# Patient Record
Sex: Male | Born: 1935 | Race: White | Hispanic: No | Smoking: Never smoker
Health system: Southern US, Community
[De-identification: ages and names within clinical notes are randomized; demographics above are authoritative.]

## PROBLEM LIST (undated history)

## (undated) DIAGNOSIS — I499 Cardiac arrhythmia, unspecified: Secondary | ICD-10-CM

## (undated) DIAGNOSIS — I10 Essential (primary) hypertension: Secondary | ICD-10-CM

## (undated) DIAGNOSIS — I509 Heart failure, unspecified: Secondary | ICD-10-CM

## (undated) DIAGNOSIS — N189 Chronic kidney disease, unspecified: Secondary | ICD-10-CM

## (undated) DIAGNOSIS — R001 Bradycardia, unspecified: Secondary | ICD-10-CM

## (undated) DIAGNOSIS — H9190 Unspecified hearing loss, unspecified ear: Secondary | ICD-10-CM

## (undated) DIAGNOSIS — F0390 Unspecified dementia without behavioral disturbance: Secondary | ICD-10-CM

## (undated) HISTORY — DX: Bradycardia, unspecified: R00.1

## (undated) HISTORY — PX: PARATHYROIDECTOMY: SHX19

## (undated) HISTORY — PX: TONSILLECTOMY: SUR1361

## (undated) HISTORY — DX: Cardiac arrhythmia, unspecified: I49.9

## (undated) HISTORY — DX: Chronic kidney disease, unspecified: N18.9

## (undated) HISTORY — DX: Essential (primary) hypertension: I10

## (undated) HISTORY — DX: Heart failure, unspecified: I50.9

---

## 1997-06-28 ENCOUNTER — Encounter: Admission: RE | Admit: 1997-06-28 | Discharge: 1997-06-28 | Payer: Self-pay | Admitting: Internal Medicine

## 1997-09-02 ENCOUNTER — Encounter: Admission: RE | Admit: 1997-09-02 | Discharge: 1997-09-02 | Payer: Self-pay | Admitting: Internal Medicine

## 1997-10-04 ENCOUNTER — Encounter: Admission: RE | Admit: 1997-10-04 | Discharge: 1997-10-04 | Payer: Self-pay | Admitting: Internal Medicine

## 1997-11-04 ENCOUNTER — Encounter: Admission: RE | Admit: 1997-11-04 | Discharge: 1997-11-04 | Payer: Self-pay | Admitting: Internal Medicine

## 1998-02-02 ENCOUNTER — Encounter: Admission: RE | Admit: 1998-02-02 | Discharge: 1998-02-02 | Payer: Self-pay | Admitting: Internal Medicine

## 1998-02-10 ENCOUNTER — Encounter: Admission: RE | Admit: 1998-02-10 | Discharge: 1998-02-10 | Payer: Self-pay | Admitting: Internal Medicine

## 1998-03-22 ENCOUNTER — Encounter: Admission: RE | Admit: 1998-03-22 | Discharge: 1998-03-22 | Payer: Self-pay | Admitting: Internal Medicine

## 1998-04-04 ENCOUNTER — Encounter: Admission: RE | Admit: 1998-04-04 | Discharge: 1998-04-04 | Payer: Self-pay | Admitting: Internal Medicine

## 1998-06-01 ENCOUNTER — Encounter: Payer: Self-pay | Admitting: Emergency Medicine

## 1998-06-01 ENCOUNTER — Emergency Department (HOSPITAL_COMMUNITY): Admission: EM | Admit: 1998-06-01 | Discharge: 1998-06-01 | Payer: Self-pay | Admitting: Emergency Medicine

## 1998-06-02 ENCOUNTER — Encounter: Admission: RE | Admit: 1998-06-02 | Discharge: 1998-06-02 | Payer: Self-pay | Admitting: Internal Medicine

## 1998-08-29 ENCOUNTER — Ambulatory Visit (HOSPITAL_COMMUNITY): Admission: RE | Admit: 1998-08-29 | Discharge: 1998-08-29 | Payer: Self-pay | Admitting: Ophthalmology

## 1998-11-01 ENCOUNTER — Encounter: Admission: RE | Admit: 1998-11-01 | Discharge: 1998-11-01 | Payer: Self-pay | Admitting: Internal Medicine

## 1999-01-24 ENCOUNTER — Encounter: Admission: RE | Admit: 1999-01-24 | Discharge: 1999-01-24 | Payer: Self-pay | Admitting: Internal Medicine

## 1999-03-21 ENCOUNTER — Encounter: Admission: RE | Admit: 1999-03-21 | Discharge: 1999-03-21 | Payer: Self-pay | Admitting: Internal Medicine

## 1999-04-25 ENCOUNTER — Encounter: Admission: RE | Admit: 1999-04-25 | Discharge: 1999-04-25 | Payer: Self-pay | Admitting: Internal Medicine

## 1999-06-20 ENCOUNTER — Encounter: Admission: RE | Admit: 1999-06-20 | Discharge: 1999-06-20 | Payer: Self-pay | Admitting: Internal Medicine

## 1999-10-10 ENCOUNTER — Encounter: Admission: RE | Admit: 1999-10-10 | Discharge: 1999-10-10 | Payer: Self-pay | Admitting: Internal Medicine

## 2000-02-13 ENCOUNTER — Encounter: Admission: RE | Admit: 2000-02-13 | Discharge: 2000-02-13 | Payer: Self-pay | Admitting: Internal Medicine

## 2000-03-03 ENCOUNTER — Emergency Department (HOSPITAL_COMMUNITY): Admission: EM | Admit: 2000-03-03 | Discharge: 2000-03-03 | Payer: Self-pay | Admitting: Emergency Medicine

## 2000-03-03 ENCOUNTER — Encounter: Payer: Self-pay | Admitting: Emergency Medicine

## 2000-03-06 ENCOUNTER — Encounter: Admission: RE | Admit: 2000-03-06 | Discharge: 2000-03-06 | Payer: Self-pay | Admitting: Internal Medicine

## 2000-04-03 ENCOUNTER — Encounter: Admission: RE | Admit: 2000-04-03 | Discharge: 2000-04-03 | Payer: Self-pay | Admitting: Internal Medicine

## 2000-08-20 ENCOUNTER — Encounter: Admission: RE | Admit: 2000-08-20 | Discharge: 2000-08-20 | Payer: Self-pay | Admitting: Internal Medicine

## 2000-09-30 ENCOUNTER — Encounter: Admission: RE | Admit: 2000-09-30 | Discharge: 2000-09-30 | Payer: Self-pay | Admitting: Internal Medicine

## 2000-12-16 ENCOUNTER — Encounter: Admission: RE | Admit: 2000-12-16 | Discharge: 2000-12-16 | Payer: Self-pay | Admitting: Internal Medicine

## 2001-03-03 ENCOUNTER — Encounter: Admission: RE | Admit: 2001-03-03 | Discharge: 2001-03-03 | Payer: Self-pay | Admitting: Internal Medicine

## 2001-05-14 ENCOUNTER — Encounter: Admission: RE | Admit: 2001-05-14 | Discharge: 2001-05-14 | Payer: Self-pay | Admitting: Internal Medicine

## 2001-08-21 ENCOUNTER — Encounter: Admission: RE | Admit: 2001-08-21 | Discharge: 2001-08-21 | Payer: Self-pay | Admitting: Internal Medicine

## 2001-11-17 ENCOUNTER — Encounter: Admission: RE | Admit: 2001-11-17 | Discharge: 2001-11-17 | Payer: Self-pay | Admitting: Internal Medicine

## 2002-02-23 ENCOUNTER — Encounter: Admission: RE | Admit: 2002-02-23 | Discharge: 2002-02-23 | Payer: Self-pay | Admitting: Internal Medicine

## 2002-04-08 ENCOUNTER — Encounter (HOSPITAL_BASED_OUTPATIENT_CLINIC_OR_DEPARTMENT_OTHER): Admission: RE | Admit: 2002-04-08 | Discharge: 2002-07-07 | Payer: Self-pay | Admitting: Internal Medicine

## 2002-05-13 ENCOUNTER — Encounter: Admission: RE | Admit: 2002-05-13 | Discharge: 2002-05-13 | Payer: Self-pay | Admitting: Internal Medicine

## 2002-08-17 ENCOUNTER — Encounter: Admission: RE | Admit: 2002-08-17 | Discharge: 2002-08-17 | Payer: Self-pay | Admitting: Internal Medicine

## 2002-10-22 ENCOUNTER — Encounter: Admission: RE | Admit: 2002-10-22 | Discharge: 2002-10-22 | Payer: Self-pay | Admitting: Internal Medicine

## 2002-11-26 ENCOUNTER — Encounter: Admission: RE | Admit: 2002-11-26 | Discharge: 2002-11-26 | Payer: Self-pay | Admitting: Internal Medicine

## 2003-02-18 ENCOUNTER — Encounter: Admission: RE | Admit: 2003-02-18 | Discharge: 2003-02-18 | Payer: Self-pay | Admitting: Internal Medicine

## 2003-05-14 ENCOUNTER — Encounter: Admission: RE | Admit: 2003-05-14 | Discharge: 2003-05-14 | Payer: Self-pay | Admitting: Internal Medicine

## 2003-09-28 ENCOUNTER — Ambulatory Visit: Payer: Self-pay | Admitting: Internal Medicine

## 2003-10-22 ENCOUNTER — Ambulatory Visit: Payer: Self-pay | Admitting: Internal Medicine

## 2004-04-26 ENCOUNTER — Ambulatory Visit: Payer: Self-pay | Admitting: Internal Medicine

## 2004-07-31 ENCOUNTER — Ambulatory Visit: Payer: Self-pay | Admitting: Internal Medicine

## 2004-12-22 ENCOUNTER — Ambulatory Visit: Payer: Self-pay | Admitting: Internal Medicine

## 2005-06-05 ENCOUNTER — Ambulatory Visit: Payer: Self-pay | Admitting: Internal Medicine

## 2005-11-13 ENCOUNTER — Ambulatory Visit: Payer: Self-pay | Admitting: Internal Medicine

## 2005-11-13 LAB — CONVERTED CEMR LAB
BUN: 24 mg/dL — ABNORMAL HIGH (ref 6–23)
CO2: 25 meq/L (ref 19–32)
Calcium: 9.7 mg/dL (ref 8.4–10.5)
Chloride: 99 meq/L (ref 96–112)
Creatinine, Ser: 1.43 mg/dL (ref 0.40–1.50)
Creatinine, Urine: 94.6 mg/dL
Glucose, Bld: 202 mg/dL — ABNORMAL HIGH (ref 70–99)
Microalb Creat Ratio: 13 mg/g (ref 0.0–30.0)
Microalb, Ur: 1.23 mg/dL (ref 0.00–1.89)
Potassium: 4.2 meq/L (ref 3.5–5.3)
Sodium: 139 meq/L (ref 135–145)

## 2005-12-04 DIAGNOSIS — E1165 Type 2 diabetes mellitus with hyperglycemia: Secondary | ICD-10-CM

## 2005-12-04 DIAGNOSIS — I10 Essential (primary) hypertension: Secondary | ICD-10-CM | POA: Insufficient documentation

## 2005-12-04 DIAGNOSIS — E1149 Type 2 diabetes mellitus with other diabetic neurological complication: Secondary | ICD-10-CM | POA: Insufficient documentation

## 2005-12-04 DIAGNOSIS — E669 Obesity, unspecified: Secondary | ICD-10-CM | POA: Insufficient documentation

## 2005-12-04 DIAGNOSIS — E781 Pure hyperglyceridemia: Secondary | ICD-10-CM | POA: Insufficient documentation

## 2005-12-04 DIAGNOSIS — E1121 Type 2 diabetes mellitus with diabetic nephropathy: Secondary | ICD-10-CM | POA: Insufficient documentation

## 2005-12-26 ENCOUNTER — Ambulatory Visit: Payer: Self-pay | Admitting: Internal Medicine

## 2005-12-31 ENCOUNTER — Ambulatory Visit: Payer: Self-pay | Admitting: Internal Medicine

## 2006-04-22 ENCOUNTER — Ambulatory Visit: Payer: Self-pay | Admitting: Internal Medicine

## 2006-04-22 LAB — CONVERTED CEMR LAB
Blood Glucose, Fingerstick: 232
Hgb A1c MFr Bld: 7 %

## 2006-04-24 ENCOUNTER — Encounter: Payer: Self-pay | Admitting: Licensed Clinical Social Worker

## 2006-04-24 ENCOUNTER — Encounter: Payer: Self-pay | Admitting: Internal Medicine

## 2006-04-24 ENCOUNTER — Ambulatory Visit: Payer: Self-pay | Admitting: Hospitalist

## 2006-04-24 ENCOUNTER — Telehealth: Payer: Self-pay | Admitting: *Deleted

## 2006-04-24 LAB — CONVERTED CEMR LAB
Blood Glucose, Fingerstick: 157
Cholesterol: 133 mg/dL (ref 0–200)
HDL: 42 mg/dL (ref >39)
LDL Cholesterol: 76 mg/dL (ref 0–99)
Total CHOL/HDL Ratio: 3.2
Triglycerides: 74 mg/dL (ref <150)
VLDL: 15 mg/dL (ref 0–40)

## 2006-04-26 ENCOUNTER — Telehealth: Payer: Self-pay | Admitting: Licensed Clinical Social Worker

## 2006-04-26 ENCOUNTER — Encounter: Payer: Self-pay | Admitting: Licensed Clinical Social Worker

## 2006-05-02 ENCOUNTER — Telehealth (INDEPENDENT_AMBULATORY_CARE_PROVIDER_SITE_OTHER): Payer: Self-pay | Admitting: *Deleted

## 2006-05-08 ENCOUNTER — Telehealth (INDEPENDENT_AMBULATORY_CARE_PROVIDER_SITE_OTHER): Payer: Self-pay | Admitting: *Deleted

## 2006-06-26 ENCOUNTER — Telehealth (INDEPENDENT_AMBULATORY_CARE_PROVIDER_SITE_OTHER): Payer: Self-pay | Admitting: Pharmacy Technician

## 2006-06-26 ENCOUNTER — Telehealth (INDEPENDENT_AMBULATORY_CARE_PROVIDER_SITE_OTHER): Payer: Self-pay | Admitting: *Deleted

## 2006-07-17 ENCOUNTER — Encounter: Payer: Self-pay | Admitting: Internal Medicine

## 2006-07-23 ENCOUNTER — Telehealth (INDEPENDENT_AMBULATORY_CARE_PROVIDER_SITE_OTHER): Payer: Self-pay | Admitting: *Deleted

## 2006-08-07 ENCOUNTER — Ambulatory Visit: Payer: Self-pay | Admitting: Internal Medicine

## 2006-08-07 LAB — CONVERTED CEMR LAB
ALT: 13 units/L (ref 0–53)
AST: 14 units/L (ref 0–37)
Albumin: 4.6 g/dL (ref 3.5–5.2)
Alkaline Phosphatase: 92 units/L (ref 39–117)
BUN: 31 mg/dL — ABNORMAL HIGH (ref 6–23)
Blood Glucose, Fingerstick: 173
CO2: 24 meq/L (ref 19–32)
Calcium: 9.5 mg/dL (ref 8.4–10.5)
Chloride: 100 meq/L (ref 96–112)
Creatinine, Ser: 1.29 mg/dL (ref 0.40–1.50)
Creatinine, Urine: 115.6 mg/dL
Glucose, Bld: 217 mg/dL — ABNORMAL HIGH (ref 70–99)
Hgb A1c MFr Bld: 7.6 %
Microalb Creat Ratio: 24.2 mg/g (ref 0.0–30.0)
Microalb, Ur: 2.8 mg/dL — ABNORMAL HIGH (ref 0.00–1.89)
Potassium: 4.5 meq/L (ref 3.5–5.3)
Sodium: 140 meq/L (ref 135–145)
Total Bilirubin: 0.5 mg/dL (ref 0.3–1.2)
Total Protein: 7.3 g/dL (ref 6.0–8.3)

## 2006-10-10 ENCOUNTER — Encounter (INDEPENDENT_AMBULATORY_CARE_PROVIDER_SITE_OTHER): Payer: Self-pay | Admitting: *Deleted

## 2006-10-29 ENCOUNTER — Ambulatory Visit: Payer: Self-pay | Admitting: Hospitalist

## 2006-12-04 ENCOUNTER — Encounter: Payer: Self-pay | Admitting: Internal Medicine

## 2007-01-14 ENCOUNTER — Telehealth: Payer: Self-pay | Admitting: Internal Medicine

## 2007-01-27 ENCOUNTER — Telehealth: Payer: Self-pay | Admitting: *Deleted

## 2007-01-28 ENCOUNTER — Ambulatory Visit: Payer: Self-pay | Admitting: Internal Medicine

## 2007-01-28 LAB — CONVERTED CEMR LAB
BUN: 20 mg/dL (ref 6–23)
Blood Glucose, Fingerstick: 216
CO2: 23 meq/L (ref 19–32)
Calcium: 9.8 mg/dL (ref 8.4–10.5)
Chloride: 103 meq/L (ref 96–112)
Creatinine, Ser: 1.36 mg/dL (ref 0.40–1.50)
Glucose, Bld: 194 mg/dL — ABNORMAL HIGH (ref 70–99)
Hgb A1c MFr Bld: 7.8 %
Potassium: 4.4 meq/L (ref 3.5–5.3)
Sodium: 141 meq/L (ref 135–145)

## 2007-04-18 ENCOUNTER — Ambulatory Visit: Payer: Self-pay | Admitting: Internal Medicine

## 2007-04-18 LAB — CONVERTED CEMR LAB
Blood Glucose, Fingerstick: 347
Creatinine, Urine: 35.1 mg/dL
Hgb A1c MFr Bld: 8.1 %
Microalb Creat Ratio: 45 mg/g — ABNORMAL HIGH (ref 0.0–30.0)
Microalb, Ur: 1.58 mg/dL (ref 0.00–1.89)

## 2007-05-15 ENCOUNTER — Ambulatory Visit: Payer: Self-pay | Admitting: Internal Medicine

## 2007-05-15 ENCOUNTER — Encounter: Payer: Self-pay | Admitting: Internal Medicine

## 2007-05-15 LAB — CONVERTED CEMR LAB
Cholesterol: 130 mg/dL (ref 0–200)
HDL: 37 mg/dL — ABNORMAL LOW (ref >39)
LDL Cholesterol: 78 mg/dL (ref 0–99)
Total CHOL/HDL Ratio: 3.5
Triglycerides: 73 mg/dL (ref <150)
VLDL: 15 mg/dL (ref 0–40)

## 2007-06-20 ENCOUNTER — Telehealth (INDEPENDENT_AMBULATORY_CARE_PROVIDER_SITE_OTHER): Payer: Self-pay | Admitting: Pharmacy Technician

## 2007-10-21 ENCOUNTER — Ambulatory Visit: Payer: Self-pay | Admitting: Internal Medicine

## 2008-02-10 ENCOUNTER — Encounter: Payer: Self-pay | Admitting: Internal Medicine

## 2008-02-16 ENCOUNTER — Ambulatory Visit: Payer: Self-pay | Admitting: Internal Medicine

## 2008-02-16 ENCOUNTER — Encounter: Payer: Self-pay | Admitting: Internal Medicine

## 2008-02-16 LAB — CONVERTED CEMR LAB
Blood Glucose, Fingerstick: 274
Hgb A1c MFr Bld: 7 %

## 2008-02-19 ENCOUNTER — Telehealth: Payer: Self-pay | Admitting: Internal Medicine

## 2008-03-30 ENCOUNTER — Telehealth (INDEPENDENT_AMBULATORY_CARE_PROVIDER_SITE_OTHER): Payer: Self-pay | Admitting: *Deleted

## 2008-03-31 ENCOUNTER — Ambulatory Visit: Payer: Self-pay | Admitting: Internal Medicine

## 2008-03-31 LAB — CONVERTED CEMR LAB: Blood Glucose, Fingerstick: 145

## 2008-04-02 ENCOUNTER — Telehealth: Payer: Self-pay | Admitting: Internal Medicine

## 2008-04-09 ENCOUNTER — Ambulatory Visit: Payer: Self-pay | Admitting: Internal Medicine

## 2008-04-09 LAB — CONVERTED CEMR LAB
BUN: 26 mg/dL — ABNORMAL HIGH (ref 6–23)
CO2: 21 meq/L (ref 19–32)
Calcium: 9.9 mg/dL (ref 8.4–10.5)
Chloride: 103 meq/L (ref 96–112)
Creatinine, Ser: 1.28 mg/dL (ref 0.40–1.50)
Creatinine, Urine: 72.6 mg/dL
Glucose, Bld: 206 mg/dL — ABNORMAL HIGH (ref 70–99)
Microalb Creat Ratio: 49.7 mg/g — ABNORMAL HIGH (ref 0.0–30.0)
Microalb, Ur: 3.61 mg/dL — ABNORMAL HIGH (ref 0.00–1.89)
Potassium: 4.6 meq/L (ref 3.5–5.3)
Sodium: 140 meq/L (ref 135–145)

## 2008-05-14 ENCOUNTER — Telehealth: Payer: Self-pay | Admitting: *Deleted

## 2008-06-01 ENCOUNTER — Telehealth: Payer: Self-pay | Admitting: *Deleted

## 2008-06-15 ENCOUNTER — Encounter: Payer: Self-pay | Admitting: Internal Medicine

## 2008-09-24 ENCOUNTER — Ambulatory Visit: Payer: Self-pay | Admitting: Internal Medicine

## 2008-09-24 LAB — CONVERTED CEMR LAB
Blood Glucose, Fingerstick: 168
Hgb A1c MFr Bld: 8.4 %

## 2008-10-13 ENCOUNTER — Telehealth: Payer: Self-pay | Admitting: Internal Medicine

## 2008-11-02 ENCOUNTER — Ambulatory Visit: Payer: Self-pay | Admitting: Internal Medicine

## 2008-11-22 ENCOUNTER — Ambulatory Visit: Payer: Self-pay | Admitting: Family Medicine

## 2008-11-23 ENCOUNTER — Ambulatory Visit: Payer: Self-pay | Admitting: Family Medicine

## 2008-11-24 LAB — CONVERTED CEMR LAB
BUN: 24 mg/dL — ABNORMAL HIGH (ref 6–23)
CO2: 26 meq/L (ref 19–32)
Calcium: 9.3 mg/dL (ref 8.4–10.5)
Chloride: 106 meq/L (ref 96–112)
Cholesterol: 148 mg/dL (ref 0–200)
Creatinine, Ser: 1.5 mg/dL (ref 0.4–1.5)
GFR calc non Af Amer: 48.69 mL/min (ref >60)
Glucose, Bld: 159 mg/dL — ABNORMAL HIGH (ref 70–99)
HDL: 33.7 mg/dL — ABNORMAL LOW (ref >39.00)
LDL Cholesterol: 98 mg/dL (ref 0–99)
Potassium: 4.5 meq/L (ref 3.5–5.1)
Sodium: 140 meq/L (ref 135–145)
Total CHOL/HDL Ratio: 4
Triglycerides: 81 mg/dL (ref 0.0–149.0)
VLDL: 16.2 mg/dL (ref 0.0–40.0)

## 2009-01-04 ENCOUNTER — Telehealth: Payer: Self-pay | Admitting: Family Medicine

## 2009-03-08 ENCOUNTER — Ambulatory Visit: Payer: Self-pay | Admitting: Family Medicine

## 2009-03-09 LAB — CONVERTED CEMR LAB
ALT: 20 units/L (ref 0–53)
AST: 20 units/L (ref 0–37)
Albumin: 4.3 g/dL (ref 3.5–5.2)
Alkaline Phosphatase: 72 units/L (ref 39–117)
BUN: 19 mg/dL (ref 6–23)
Bilirubin, Direct: 0.2 mg/dL (ref 0.0–0.3)
CO2: 28 meq/L (ref 19–32)
Calcium: 10.1 mg/dL (ref 8.4–10.5)
Chloride: 103 meq/L (ref 96–112)
Cholesterol: 144 mg/dL (ref 0–200)
Creatinine, Ser: 1.4 mg/dL (ref 0.4–1.5)
GFR calc non Af Amer: 52.68 mL/min (ref >60)
Glucose, Bld: 168 mg/dL — ABNORMAL HIGH (ref 70–99)
HDL: 40.7 mg/dL (ref >39.00)
Hgb A1c MFr Bld: 7.8 % — ABNORMAL HIGH (ref 4.6–6.5)
LDL Cholesterol: 75 mg/dL (ref 0–99)
Potassium: 4.5 meq/L (ref 3.5–5.1)
Sodium: 139 meq/L (ref 135–145)
Total Bilirubin: 0.5 mg/dL (ref 0.3–1.2)
Total CHOL/HDL Ratio: 4
Total Protein: 7.5 g/dL (ref 6.0–8.3)
Triglycerides: 141 mg/dL (ref 0.0–149.0)
VLDL: 28.2 mg/dL (ref 0.0–40.0)

## 2009-04-07 ENCOUNTER — Ambulatory Visit: Payer: Self-pay | Admitting: Family Medicine

## 2009-04-07 ENCOUNTER — Encounter (HOSPITAL_BASED_OUTPATIENT_CLINIC_OR_DEPARTMENT_OTHER): Admission: RE | Admit: 2009-04-07 | Discharge: 2009-05-18 | Payer: Self-pay | Admitting: Internal Medicine

## 2009-04-07 ENCOUNTER — Encounter: Admission: RE | Admit: 2009-04-07 | Discharge: 2009-04-07 | Payer: Self-pay | Admitting: Family Medicine

## 2009-04-07 DIAGNOSIS — E1169 Type 2 diabetes mellitus with other specified complication: Secondary | ICD-10-CM | POA: Insufficient documentation

## 2009-04-07 LAB — CONVERTED CEMR LAB
ALT: 21 units/L (ref 0–53)
AST: 21 units/L (ref 0–37)
Albumin: 4.3 g/dL (ref 3.5–5.2)
Alkaline Phosphatase: 70 units/L (ref 39–117)
Basophils Absolute: 0.1 10*3/uL (ref 0.0–0.1)
Basophils Relative: 1 % (ref 0.0–3.0)
Bilirubin, Direct: 0.1 mg/dL (ref 0.0–0.3)
Eosinophils Absolute: 0.1 10*3/uL (ref 0.0–0.7)
Eosinophils Relative: 1.7 % (ref 0.0–5.0)
HCT: 49.6 % (ref 39.0–52.0)
Hemoglobin: 16.5 g/dL (ref 13.0–17.0)
Lymphocytes Relative: 29 % (ref 12.0–46.0)
Lymphs Abs: 2 10*3/uL (ref 0.7–4.0)
MCHC: 33.3 g/dL (ref 30.0–36.0)
MCV: 89.7 fL (ref 78.0–100.0)
Monocytes Absolute: 0.7 10*3/uL (ref 0.1–1.0)
Monocytes Relative: 10.8 % (ref 3.0–12.0)
Neutro Abs: 3.9 10*3/uL (ref 1.4–7.7)
Neutrophils Relative %: 57.5 % (ref 43.0–77.0)
Platelets: 195 10*3/uL (ref 150.0–400.0)
RBC: 5.53 M/uL (ref 4.22–5.81)
RDW: 13.7 % (ref 11.5–14.6)
Total Bilirubin: 0.7 mg/dL (ref 0.3–1.2)
Total Protein: 7.6 g/dL (ref 6.0–8.3)
WBC: 6.8 10*3/uL (ref 4.5–10.5)

## 2009-04-11 ENCOUNTER — Ambulatory Visit: Payer: Self-pay | Admitting: Surgery

## 2009-04-19 ENCOUNTER — Telehealth: Payer: Self-pay | Admitting: Family Medicine

## 2009-04-20 ENCOUNTER — Ambulatory Visit: Payer: Self-pay | Admitting: Family Medicine

## 2009-05-23 ENCOUNTER — Ambulatory Visit: Payer: Self-pay | Admitting: Family Medicine

## 2009-06-06 ENCOUNTER — Ambulatory Visit: Payer: Self-pay | Admitting: Family Medicine

## 2009-06-07 LAB — CONVERTED CEMR LAB
ALT: 20 units/L (ref 0–53)
AST: 19 units/L (ref 0–37)
Albumin: 4.1 g/dL (ref 3.5–5.2)
Alkaline Phosphatase: 69 units/L (ref 39–117)
Bilirubin, Direct: 0.1 mg/dL (ref 0.0–0.3)
Cholesterol: 145 mg/dL (ref 0–200)
HDL: 43.1 mg/dL (ref >39.00)
Hgb A1c MFr Bld: 7.5 % — ABNORMAL HIGH (ref 4.6–6.5)
LDL Cholesterol: 87 mg/dL (ref 0–99)
Total Bilirubin: 0.8 mg/dL (ref 0.3–1.2)
Total CHOL/HDL Ratio: 3
Total Protein: 6.6 g/dL (ref 6.0–8.3)
Triglycerides: 74 mg/dL (ref 0.0–149.0)
VLDL: 14.8 mg/dL (ref 0.0–40.0)

## 2009-08-23 ENCOUNTER — Encounter (INDEPENDENT_AMBULATORY_CARE_PROVIDER_SITE_OTHER): Payer: Self-pay | Admitting: *Deleted

## 2009-08-29 ENCOUNTER — Ambulatory Visit: Payer: Self-pay | Admitting: Family Medicine

## 2009-08-29 LAB — CONVERTED CEMR LAB: Hgb A1c MFr Bld: 7.4 % — ABNORMAL HIGH (ref 4.6–6.5)

## 2009-10-11 ENCOUNTER — Ambulatory Visit: Payer: Self-pay | Admitting: Family Medicine

## 2009-10-21 ENCOUNTER — Encounter (HOSPITAL_BASED_OUTPATIENT_CLINIC_OR_DEPARTMENT_OTHER): Admission: RE | Admit: 2009-10-21 | Discharge: 2009-11-16 | Payer: Self-pay | Admitting: Internal Medicine

## 2009-11-02 ENCOUNTER — Encounter: Payer: Self-pay | Admitting: Family Medicine

## 2009-11-03 ENCOUNTER — Telehealth: Payer: Self-pay | Admitting: Family Medicine

## 2009-12-07 ENCOUNTER — Telehealth: Payer: Self-pay | Admitting: Family Medicine

## 2009-12-08 ENCOUNTER — Ambulatory Visit: Payer: Self-pay | Admitting: Family Medicine

## 2009-12-12 LAB — CONVERTED CEMR LAB: Hgb A1c MFr Bld: 7.2 % — ABNORMAL HIGH (ref 4.6–6.5)

## 2009-12-29 ENCOUNTER — Ambulatory Visit: Payer: Self-pay | Admitting: Family Medicine

## 2009-12-29 DIAGNOSIS — F4321 Adjustment disorder with depressed mood: Secondary | ICD-10-CM | POA: Insufficient documentation

## 2010-01-18 ENCOUNTER — Ambulatory Visit: Payer: Self-pay | Admitting: Family Medicine

## 2010-01-18 DIAGNOSIS — L919 Hypertrophic disorder of the skin, unspecified: Secondary | ICD-10-CM

## 2010-01-18 DIAGNOSIS — L909 Atrophic disorder of skin, unspecified: Secondary | ICD-10-CM | POA: Insufficient documentation

## 2010-02-01 ENCOUNTER — Encounter: Payer: Self-pay | Admitting: Family Medicine

## 2010-02-01 ENCOUNTER — Telehealth: Payer: Self-pay | Admitting: Family Medicine

## 2010-02-23 NOTE — Progress Notes (Signed)
Summary: testing supplies/dmr  Phone Note Outgoing Call   Call placed by: Barnabas Harries,  July 23, 2006 9:28 AM Summary of Call: [facility] [address], [place]. Damon [name] has RX for testing 3x/day - 1050 strips left, and syringe refills left as well. The person there verified that he should be able to get refills using his red, white & blue Medicare B card for both the syringes & testing supplies.. Initial call taken by: Barnabas Harries,  July 23, 2006 9:38 AM  Follow-up for Phone Call        Called wife- Damon [name] and let her know that she & her husband have refills to get their test strips & lancets and they need to use their RED, White & Blue cards to get them Follow-up by: Barnabas Harries,  July 23, 2006 9:44 AM

## 2010-02-23 NOTE — Progress Notes (Signed)
Summary: refill request for insulin  Phone Note Refill Request Call back at Home Phone [phone] Message from:  wife  Refills Requested: Medication #1:  humulin  70/30 Phoned request from wife, this is not on med list, [facility].  Initial call taken by: Marty Heck CMA,  November 03, 2009 12:31 PM    Prescriptions: RELION 70/30 70-30 % SUSP (INSULIN ISOPHANE & REGULAR) Inject 25 units Subcutaneously before breakfast and 20 units before the evening meal.  #3 x 11   Entered and Authorized by:   Arnette Norris MD   Signed by:   Arnette Norris MD on 11/03/2009   Method used:   Electronically to        [facility] ([place]  [address]       Ph: [phone]       Fax: [phone]   RxID:   [id number] [place] 29G X 1/2" 0.5 ML MISC (INSULIN SYRINGE-NEEDLE U-100) Inject insulin two times a day as directed  #100 x 11   Entered and Authorized by:   Arnette Norris MD   Signed by:   Arnette Norris MD on 11/03/2009   Method used:   Electronically to        [facility] (mail-order)       [place]  [address]       Ph: [phone]       Fax: [phone]   RxID:   [id number]

## 2010-02-23 NOTE — Assessment & Plan Note (Signed)
Summary: EST-CK/FU/MEDS/CFB   Vital Signs:  Patient Profile:   77 Years Old Male Weight:      194.5 pounds (88.41 kg) Temp:     98.7 degrees F (37.06 degrees C) oral Pulse rate:   78 / minute BP sitting:   113 / 69  (right arm)  Pt. in pain?   no  Vitals Entered By: Nadine Counts Deborra Medina) (April 22, 2006 10:26 AM)              Is Patient Diabetic? Yes  Nutritional Status Obese CBG Result 232  Have you ever been in a relationship where you felt threatened, hurt or afraid?No   Does patient need assistance? Functional Status Self care Ambulation Normal   Chief Complaint:  routine f/u chronic issues.  History of Present Illness: 89 man with diabetes  Prior Medications: GEMFIBROZIL 600 MG TABS (GEMFIBROZIL) take one tablet by mouth two times a day . 02/23/02 NOVOLOG MIX 70/30  SUSP (INSULIN ASP PROT & ASP (HUM) SUSP) inject 20 unit subcutaneously in the morning and at bedtime METFORMIN HCL 1000 MG TABS (METFORMIN HCL) take one tablet by mouth two times a day. FLUOCINONIDE 0.05 % OINT (FLUOCINONIDE) apply ointment as directed two times a day ASPIRIN 325 MG TABS (ASPIRIN) take 1 tab daily Current Allergies (reviewed today): ! PCN    Risk Factors:  Tobacco use:  current    Smokeless:  Yes -- 1 pack per day      Impression & Recommendations:  Problem # 1:  DIABETES MELLITUS, TYPE II (ICD-250.00)  His updated medication list for this problem includes:    Novolog Mix 70/30 Susp (Insulin asp prot & asp (hum) susp) ..... Inject 20 unit subcutaneously in the morning and at bedtime    Metformin Hcl 1000 Mg Tabs (Metformin hcl) .Marland Kitchen... Take one tablet by mouth two times a day.    Lisinopril-hydrochlorothiazide 20-12.5 Mg Tabs (Lisinopril-hydrochlorothiazide) .Marland Kitchen... Take 1 tablet by mouth once a day    Aspirin 325 Mg Tabs (Aspirin) .Marland Kitchen... Take 1 tab daily  Orders: T-Hgb A1C (in-house) [id number]) T- Capillary Blood Glucose [id number])  He takes metformin 1000 two times a day  and has it with him.  He takes 70/30 25 units two times a day and has no sx of hypoglycemia.  A1c today is 7.0.  Has eye appointment with Dr. Delman Cheadle this week.  Urine alb = 13 in 10-07.  Overdue for lipids -- will try to get this this week. Examined feet.  He is borderline for high risk -- some sites were dull to filament and he has calluses.  Pulses are OK and there are no ulcers or infection.  Needs appointment with Barnabas Harries.   Problem # 2:  HYPERTENSION (ICD-401.9)  His updated medication list for this problem includes:    Lisinopril-hydrochlorothiazide 20-12.5 Mg Tabs (Lisinopril-hydrochlorothiazide) .Marland Kitchen... Take 1 tablet by mouth once a day  He has been taking this once a day and BP is good.  Will leave it at that.  Denies any chest pain or dyspnea/orthopnea.  No edema. K = 4.2 and creat = 1.4 in 10-07.   Problem # 3:  Preventive Health Care (ICD-V70.0) Refuses colonoscopy  Medications Added to Medication List This Visit: 1)  Lisinopril-hydrochlorothiazide 20-12.5 Mg Tabs (Lisinopril-hydrochlorothiazide) .... Take 1 tablet by mouth once a day   Patient Instructions: 1)  Return in 6 months.  Laboratory Results   Blood Tests   Date/Time Recieved: April 22, 2006 10:41 AM  Date/Time Reported: April 22, 2006 10:41 AM ..................................................................Marland KitchenMelvia Heaps  April 22, 2006 10:41 AM   HGBA1C: 7.0%   (Normal Range: Non-Diabetic - 3-6%   Control Diabetic - 6-8%) CBG Random: 232

## 2010-02-23 NOTE — Medication Information (Signed)
Summary: [facility] Written Authorization/  [facility] Written Authorization/Renewal   Imported By: Virgia Land 02/01/2010 16:58:29  _____________________________________________________________________  External Attachment:    Type:   Image     Comment:   External Document

## 2010-02-23 NOTE — Progress Notes (Signed)
Summary: request help obtaining insulin/dmr  Phone Note Call from Patient Call back at Home Phone [phone]   Caller: Spouse Summary of Call: "Nussen, my husband, needs insulin, we cannot afford it right now. Can we help them?" ( She then said he had an "operation"-cataract, therfore was out of work x1 week, will be going back Monday.) they'd appreciate it if we could help. Initial call taken by: Barnabas Harries,  June 26, 2006 1:25 PM  Follow-up for Phone Call        Patient's wife called again asking for insulin. Johnte will be out of insulin by tomorrow morning. I told her I do not think we have any of the type of insulin Branko needs.  She will b waiting for a response from [place]. Follow-up by: Barnabas Harries,  June 26, 2006 2:04 PM  Additional Follow-up for Phone Call Additional follow up Details #1::        (1)  Which type of 70/30 insulin is he currently using; there are 2 types on the medication list. (2)  Natasha (pharmacy tech) will know if we have samples or can help obtain the medication. ..................................................................Marland KitchenBertha Stakes MD  June 26, 2006 3:26 PM    patient never followed up with me.  Will wait until patient follow up...................................................................Marland KitchenAntony Blackbird  August 06, 2006 2:53 PM

## 2010-02-23 NOTE — Assessment & Plan Note (Signed)
Summary: ROA 3 MTHS CYD   Vital Signs:  Patient profile:   77 year old male Height:      65 inches Weight:      203.25 pounds BMI:     33.94 Temp:     97.9 degrees F oral Pulse rate:   84 / minute Pulse rhythm:   regular BP sitting:   150 / 66  (left arm) Cuff size:   regular  Vitals Entered By: Christena Deem CMA Deborra Medina) (March 08, 2009 11:45 AM) CC: 3 months follow up   History of Present Illness: 77 yo here for follow up HTN and DM.    1.  DM-  Hg1c in 09/2008 was 8.4.  Has not been checking his sugars three times a day because strips are so expensive.  Has only been checking them qam, which have been ranging 80-174.  Taking insulin 25 units qam and 20 units at bedtime.  No hypoglyemia.  See eye doctor yearly.  Had pneumovax in 2009.  Cr stable for 2 years.    2.  HTN- Elevated today and at [facility] office visit.  No HA, CP, SOB.  No blurred vision.  On Lisinopril-HCTZ 20-12.5 mg daily.  Well man- has not had lipid panel in 2 years.  Zostavax and Tetanus in 11/2008.  Still refuses colonoscopy.  Current Medications (verified): 1)  Gemfibrozil 600 Mg Tabs (Gemfibrozil) .... Take One Tablet By Mouth Two Times A Day . 02/23/02 2)  Metformin Hcl 1000 Mg Tabs (Metformin Hcl) .... Take One Tablet By Mouth Two Times A Day. 3)  Fluocinonide 0.05 % Oint (Fluocinonide) .... Apply Ointment As Directed Two Times A Day 4)  Aspirin 325 Mg Tabs (Aspirin) .... Take 1 Tab Daily 5)  One Touch Ultra Test  Strp (Glucose Blood) .... To Test Blood Glucsoe Three Times A Day 6)  Bd Ultra-Fine 33 Lancets  Misc (Lancets) .... To Test Blood Glucsoe Three Times A Day 7)  Relion Insulin Syringe 29g X 1/2" 0.5 Ml Misc (Insulin Syringe-Needle U-100) .... Inject Insulin Two Times A Day As Directed 8)  Relion 70/30 70-30 % Susp (Insulin Isophane & Regular) .... Inject 25 Units Subcutaneously Before Breakfast and 20 Units Before The Evening Meal. 9)  Lisinopril-Hydrochlorothiazide 20-25 Mg  Tabs  (Lisinopril-Hydrochlorothiazide) .... Take 1 Tab By Mouth Daily  Allergies: 1)  ! Pcn  Review of Systems      See HPI General:  Denies chills and fever. Eyes:  Denies blurring. ENT:  Denies difficulty swallowing. CV:  Denies chest pain or discomfort. Resp:  Denies shortness of breath. Derm:  Denies rash. Neuro:  Denies headaches, tingling, tremors, visual disturbances, and weakness.  Physical Exam  General:  alert.   Eyes:  vision grossly intact, pupils equal, pupils round, and pupils reactive to light.  anicteric Mouth:  pharynx pink and moist, no erythema, and no exudates.  edentulous.   Lungs:  normal respiratory effort, no crackles, and no wheezes.   Heart:   regular rhythm, no murmur, and no gallop.   Extremities:  No clubbing, cyanosis, edema, or deformity noted with normal full range of motion of all joints.   Psych:  Oriented X3, memory intact for recent and remote, normally interactive, good eye contact, not anxious appearing, and not depressed appearing.     Impression & Recommendations:  Problem # 1:  HYPERTENSION (ICD-401.9) Assessment Deteriorated Increased Lisinopril HCTZ.  Check BMET.  f/u in one month. The following medications were removed from the medication list:  Lisinopril-hydrochlorothiazide 20-12.5 Mg Tabs (Lisinopril-hydrochlorothiazide) .Marland Kitchen... Take 1 tablet by mouth once a day His updated medication list for this problem includes:    Lisinopril-hydrochlorothiazide 20-25 Mg Tabs (Lisinopril-hydrochlorothiazide) .Marland Kitchen... Take 1 tab by mouth daily  Orders: Venipuncture [id number]) TLB-BMP (Basic Metabolic Panel-BMET) (99991111)  Problem # 2:  DIABETES MELLITUS, TYPE II (ICD-250.00) Assessment: Unchanged Recheck a1c today. The following medications were removed from the medication list:    Lisinopril-hydrochlorothiazide 20-12.5 Mg Tabs (Lisinopril-hydrochlorothiazide) .Marland Kitchen... Take 1 tablet by mouth once a day    Novolin 70/30 70-30 % Susp (Insulin  isophane & regular) ..... Inject 30 units subcutaneously before breakfast and 20 units before evening meal His updated medication list for this problem includes:    Metformin Hcl 1000 Mg Tabs (Metformin hcl) .Marland Kitchen... Take one tablet by mouth two times a day.    Aspirin 325 Mg Tabs (Aspirin) .Marland Kitchen... Take 1 tab daily    Relion 70/30 70-30 % Susp (Insulin isophane & regular) ..... Inject 25 units subcutaneously before breakfast and 20 units before the evening meal.    Lisinopril-hydrochlorothiazide 20-25 Mg Tabs (Lisinopril-hydrochlorothiazide) .Marland Kitchen... Take 1 tab by mouth daily  Orders: Venipuncture [id number]) TLB-A1C / Hgb A1C (Glycohemoglobin) (83036-A1C)  Problem # 3:  HYPERTRIGLYCERIDEMIA (ICD-272.1) Assessment: Unchanged Recheck FLP today. His updated medication list for this problem includes:    Gemfibrozil 600 Mg Tabs (Gemfibrozil) .Marland Kitchen... Take one tablet by mouth two times a day . 02/23/02  Orders: Venipuncture [id number]) TLB-Lipid Panel (80061-LIPID) TLB-Hepatic/Liver Function Pnl (80076-HEPATIC)  Complete Medication List: 1)  Gemfibrozil 600 Mg Tabs (Gemfibrozil) .... Take one tablet by mouth two times a day . 02/23/02 2)  Metformin Hcl 1000 Mg Tabs (Metformin hcl) .... Take one tablet by mouth two times a day. 3)  Fluocinonide 0.05 % Oint (Fluocinonide) .... Apply ointment as directed two times a day 4)  Aspirin 325 Mg Tabs (Aspirin) .... Take 1 tab daily 5)  One Touch Ultra Test Strp (Glucose blood) .... To test blood glucsoe three times a day 6)  Bd Ultra-fine 33 Lancets Misc (Lancets) .... To test blood glucsoe three times a day 7)  Relion Insulin Syringe 29g X 1/2" 0.5 Ml Misc (Insulin syringe-needle u-100) .... Inject insulin two times a day as directed 8)  Relion 70/30 70-30 % Susp (Insulin isophane & regular) .... Inject 25 units subcutaneously before breakfast and 20 units before the evening meal. 9)  Lisinopril-hydrochlorothiazide 20-25 Mg Tabs (Lisinopril-hydrochlorothiazide) .... Take  1 tab by mouth daily  Patient Instructions: 1)  Stop taking the Lisinopril- HCTZ 20-12.5 mg and start taking taking new prescription for LIsinopril-HCTZ 20-25 mg at [facility]. 2)  Please come see me in one month. Prescriptions: LISINOPRIL-HYDROCHLOROTHIAZIDE 20-25 MG  TABS (LISINOPRIL-HYDROCHLOROTHIAZIDE) Take 1 tab by mouth daily  #90 x 3   Entered and Authorized by:   Arnette Norris MD   Signed by:   Arnette Norris MD on 03/08/2009   Method used:   Electronically to        [id number] (retail)       [address] [place], [place]  [address]       Ph: [phone]       Fax: [phone]   RxID:   [id number]   Current Allergies (reviewed today): ! PCN

## 2010-02-23 NOTE — Miscellaneous (Signed)
Summary: foot deformities noted/dmr  Clinical Lists Changes  Observations: Added new observation of DB FT EX DUE: 11/07/2006 (10/10/2006 14:25) Added new observation of HIGHRISKFT: Yes (10/10/2006 14:25) Added new observation of DBT FT EX DT: 10/10/2006 (10/10/2006 14:25) Added new observation of DBT FT EX BY: Barnabas Harries (10/10/2006 14:25)      Last LDL:                                                 76 (04/24/2006 8:08:00 PM)        Diabetic Foot Exam Foot Inspection Is there a history of a foot ulcer?              No Is there a foot ulcer now?              No Are the shoes appropriate in style and fit?          Yes Is there swelling or an abnormal foot shape?          Yes Are the toenails long?                No Are the toenails thick?                No Are the toenails ingrown?              No Is there heavy callous build-up?              Yes      Comments: significant bunions bilaterally with redness and callus build up. Prominenet metetarsal heads also with callus formation. High Risk Feet? Yes Set Next Diabetic Foot Exam here: 11/07/2006   10-g (5.07) Semmes-Weinstein Monofilament Test Performed by: Barnabas Harries  I did not do monofilament or pulses, but did visual exam with results as above.

## 2010-02-23 NOTE — Progress Notes (Signed)
Summary: insulin clarification/dmr  Phone Note Outgoing Call   Call placed by: Barnabas Harries,  April 24, 2006 9:31 AM Summary of Call: called [facility] to find out how much Jesseray is paying for his Novolog Mix 70/30- last time they ran it through (10/07) his insurance did not cover it and the price was $95.71. No active insurance in 10/07 by [facility] report and nothing refilled since 10/2005.  Called [employer] plan- the  main card costs them $12.50/year & the RX card costs them $19.95/year- the Rx card only helps them get a discount on a drug that is not covered by their insurance the discount can be 3-20%. I then called Medicare and found out that Leon does not have an active Medicare part D plan. Katy Fitch made suggestions to them about how to correct.  Of note- Mrs. prinkey says she picked up " insulin 70/30" for [facility] at [facility] on Monday and doesn't remember how much it cost or the name.asked her to please bring medicine bottles and insulin to next visit. question if they got [facility] ReliOn 70/30 with the [facility] discount.  Follow-up for Phone Call        [facility] and they confirmed that Creedon has been using Relion 70/30 insulin not Novolog Mix 70/30. added the relion to the medlist in addition to the Novolog Mix. sending to Dr. Stann Mainland to clarify Follow-up by: Barnabas Harries,  April 24, 2006 4:43 PM  New/Updated Medications: RELION 70/30 70-30 % SUSP (INSULIN ISOPHANE & REG (HUMAN)) Inject 20 units subcutaneously before breakfast and 20 untis before evening meal  New/Updated Medications: RELION 70/30 70-30 % SUSP (INSULIN ISOPHANE & REG (HUMAN)) Inject 20 units subcutaneously before breakfast and 20 untis before evening meal  Prescriptions: RELION 70/30 70-30 % SUSP (INSULIN ISOPHANE & REG (HUMAN)) Inject 20 units subcutaneously before breakfast and 20 untis before evening meal  #2 x 3   Entered and Authorized by:   Deborra Medina MD   Signed by:   Deborra Medina MD on 05/03/2006   Method  used:   Telephoned to ...       [facility]       [address] [place]       [place], [place]  [address]       Ph: [phone]       Fax: [phone]   RxID:   [id number]

## 2010-02-23 NOTE — Letter (Signed)
Summary: Diabetic Log Book  Diabetic Log Book   Imported By: Ollen Bowl 05/09/2006 09:57:16  _____________________________________________________________________  External Attachment:    Type:   Image     Comment:   External Document

## 2010-02-23 NOTE — Progress Notes (Signed)
Summary: refill/dmr  Phone Note Other Incoming   Call placed by: Barnabas Harries,  May 02, 2006 12:13 PM Summary of Call: wife Murray Hodgkins called requesting One Touch Ultra test strips be [facility] in [place] .  Follow-up for Phone Call        Phone call completed Follow-up by: Barnabas Harries,  May 03, 2006 3:40 PM  New/Updated Medications: ONE TOUCH ULTRA TEST  STRP (GLUCOSE BLOOD) to test blood glucsoe three times a day BD ULTRA-FINE 33 LANCETS  MISC (LANCETS) to test blood glucsoe three times a day  New/Updated Medications: ONE TOUCH ULTRA TEST  STRP (GLUCOSE BLOOD) to test blood glucsoe three times a day BD ULTRA-FINE 33 LANCETS  MISC (LANCETS) to test blood glucsoe three times a day  Prescriptions: BD ULTRA-FINE 33 LANCETS  MISC (LANCETS) to test blood glucsoe three times a day  #1 box x 3   Entered and Authorized by:   Deborra Medina MD   Signed by:   Deborra Medina MD on 05/03/2006   Method used:   Telephoned to ...       [facility]       [place], [place]  [address]       Ph: [phone]       Fax: [phone]   RxID:   [id number] ONE TOUCH ULTRA TEST  STRP (GLUCOSE BLOOD) to test blood glucsoe three times a day  #1 box of 100 x 11   Entered and Authorized by:   Deborra Medina MD   Signed by:   Deborra Medina MD on 05/03/2006   Method used:   Telephoned to ...       [facility]       [address], [place]       [place], [place]  [address]       Ph: [phone]       Fax: [phone]   RxID:   [id number]

## 2010-02-23 NOTE — Medication Information (Signed)
Summary: Order for Diabetic Shoes  Order for Diabetic Shoes   Imported By: Laural Benes 11/07/2009 15:21:30  _____________________________________________________________________  External Attachment:    Type:   Image     Comment:   External Document

## 2010-02-23 NOTE — Progress Notes (Signed)
Summary: pt needs something for nerves  Phone Note Call from Patient   Caller: Daughter  [name]  [phone] Summary of Call: Pt's wife passed away on sunday and his daughter is asking if pt can have something for his nerves to take if he needs to.  [facility]ses [facility] [address]. Initial call taken by: Laurie Branson CMA, AAMA,  December 07, 2009 11:33 AM  Follow-up for Phone Call        yes and please tell him I am so sorry for his loss. Talia Aron MD  December 07, 2009 11:37 AM  Advised patients daughter via message left on cell phone voicemail, [facility]  Follow-up by: Nikki Thorpe CMA (AAMA),  December 07, 2009 11:47 AM    New/Updated Medications: ALPRAZOLAM 0.25 MG TABS (ALPRAZOLAM) 1 tab by mouth three times a day as needed for anxiety Prescriptions: ALPRAZOLAM 0.25 MG TABS (ALPRAZOLAM) 1 tab by mouth three times a day as needed for anxiety  #60 x 0   Entered and Authorized by:   Talia Aron MD   Signed by:   Nikki Thorpe CMA (AAMA) on 12/07/2009   Method used:   Telephoned to ...       [facility] [address] ([facility]       [address], [address]       [place], [place]  [address]       Ph: [phone]       Fax: [phone]   RxID:   [id number]

## 2010-02-23 NOTE — Miscellaneous (Signed)
Summary: HIPPA  HIPPA   Imported By: Susette Racer 02/16/2008 12:18:51  _____________________________________________________________________  External Attachment:    Type:   Image     Comment:   External Document

## 2010-02-23 NOTE — Assessment & Plan Note (Signed)
Summary: FLU SHOT  Nurse Visit    Prior Medications: GEMFIBROZIL 600 MG TABS (GEMFIBROZIL) take one tablet by mouth two times a day . 02/23/02 METFORMIN HCL 1000 MG TABS (METFORMIN HCL) take one tablet by mouth two times a day. FLUOCINONIDE 0.05 % OINT (FLUOCINONIDE) apply ointment as directed two times a day LISINOPRIL-HYDROCHLOROTHIAZIDE 20-12.5 MG TABS (LISINOPRIL-HYDROCHLOROTHIAZIDE) Take 1 tablet by mouth once a day ASPIRIN 325 MG TABS (ASPIRIN) take 1 tab daily RELION 70/30 70-30 % SUSP (INSULIN ISOPHANE & REG (HUMAN)) Inject 20 units subcutaneously before breakfast and 20 untis before evening meal ONE TOUCH ULTRA TEST  STRP (GLUCOSE BLOOD) to test blood glucsoe three times a day BD ULTRA-FINE 33 LANCETS  MISC (LANCETS) to test blood glucsoe three times a day Current Allergies: ! PCN   Influenza Vaccine    Vaccine Type: Fluvax MCR    Site: left deltoid    Mfr: novartis    Dose: 0.25 ml    Route: IM    Given by: Morrison Old RN    Exp. Date: 07/22/2007    Lot #: [id number]    VIS given: 07/21/04 version given October 29, 2006.  Flu Vaccine Consent Questions    Do you have a history of severe allergic reactions to this vaccine? no    Any prior history of allergic reactions to egg and/or gelatin? no    Do you have a sensitivity to the preservative Thimersol? no    Do you have a past history of Guillan-Barre Syndrome? no    Do you currently have an acute febrile illness? no    Have you ever had a severe reaction to latex? no    Vaccine information given and explained to patient? yes   Orders Added: 1)  Influenza Vaccine MCR [00025] 2)  Admin 1st Vaccine Joey.[email or web address]    ]

## 2010-02-23 NOTE — Assessment & Plan Note (Signed)
Summary: 1 month follow up/rbh   Vital Signs:  Patient profile:   77 year old male Height:      65 inches Weight:      200.50 pounds BMI:     33.49 Temp:     98.1 degrees F oral Pulse rate:   60 / minute Pulse rhythm:   regular BP sitting:   138 / 68  (left arm) Cuff size:   regular  Vitals Entered By: Ozzie Hoyle LPN (March 17, 624THL 579FGE AM) CC: left foot ulcer   History of Present Illness: 77 yo diabetic here with daughter for ulcer or left foot. Daughter noticed it yesterday. Mr. Ros reports that it does not hurt, has not leaked anything or had a foul odor. Never had anything like this before. No fevers, chills, surrounding erythema, nausea, vomiting, or mental status changes.  Does not bring CBG log in today.  Hga1c on 2/15 was 7.8.  Reports fasting CBG today was 126. Denies any symptoms of hypo or hyperglycemia.  Current Medications (verified): 1)  Gemfibrozil 600 Mg Tabs (Gemfibrozil) .... Take One Tablet By Mouth Two Times A Day . 02/23/02 2)  Metformin Hcl 1000 Mg Tabs (Metformin Hcl) .... Take One Tablet By Mouth Two Times A Day. 3)  Fluocinonide 0.05 % Oint (Fluocinonide) .... Apply Ointment As Directed Two Times A Day 4)  Aspirin 325 Mg Tabs (Aspirin) .... Take 1 Tab Daily 5)  One Touch Ultra Test  Strp (Glucose Blood) .... To Test Blood Glucsoe Three Times A Day 6)  Bd Ultra-Fine 33 Lancets  Misc (Lancets) .... To Test Blood Glucsoe Three Times A Day 7)  Relion Insulin Syringe 29g X 1/2" 0.5 Ml Misc (Insulin Syringe-Needle U-100) .... Inject Insulin Two Times A Day As Directed 8)  Relion 70/30 70-30 % Susp (Insulin Isophane & Regular) .... Inject 25 Units Subcutaneously Before Breakfast and 20 Units Before The Evening Meal. 9)  Lisinopril-Hydrochlorothiazide 20-25 Mg  Tabs (Lisinopril-Hydrochlorothiazide) .... Take 1 Tab By Mouth Daily 10)  Doxycycline Hyclate 100 Mg Caps (Doxycycline Hyclate) .... Take 1 Tab Twice A Day X  10 Days  Allergies: 1)  ! Pcn  Review  of Systems      See HPI General:  Denies chills and fever. GI:  Denies nausea and vomiting.  Physical Exam  General:  alert, NAD Afebrile, normotensive. Msk:  Left foot: 3 cm Grade 2 ulcer 1st metatarsal head, unable to probe bone, no discharge, no odor. right  base of foot- Grade 1 ulcer, no erythema Neurologic:  No cranial nerve deficits noted. Station and gait are normal. Plantar reflexes are down-going bilaterally. DTRs are symmetrical throughout. Sensory, motor and coordinative functions appear intact. Psych:  Oriented X3, memory intact for recent and remote, normally interactive, good eye contact, not anxious appearing, and not depressed appearing.     Impression & Recommendations:  Problem # 1:  DIABETIC FOOT ULCER, LEFT (ICD-250.80) Assessment New Stage 2 with no signs of obvious osteomyelitis or systemic infection.  Ok to treat as outpt at this point.  Will get stat MRI to rule out osteo, CBC with diff, refer to wound care today for debridement and managment of this ulcer along with stage I ulcer on right foot.   His updated medication list for this problem includes:    Metformin Hcl 1000 Mg Tabs (Metformin hcl) .Marland Kitchen... Take one tablet by mouth two times a day.    Aspirin 325 Mg Tabs (Aspirin) .Marland Kitchen... Take 1 tab daily  Relion 70/30 70-30 % Susp (Insulin isophane & regular) ..... Inject 25 units subcutaneously before breakfast and 20 units before the evening meal.    Lisinopril-hydrochlorothiazide 20-25 Mg Tabs (Lisinopril-hydrochlorothiazide) .Marland Kitchen... Take 1 tab by mouth daily  Orders: Radiology Referral (Radiology) Venipuncture [phone]) TLB-CBC Platelet - w/Differential ([id number]) [place] Referral (Wound Care)  His updated medication list for this problem includes:    Metformin Hcl 1000 Mg Tabs (Metformin hcl) .Marland Kitchen... Take one tablet by mouth two times a day.    Aspirin 325 Mg Tabs (Aspirin) .Marland Kitchen... Take 1 tab daily    Relion 70/30 70-30 % Susp (Insulin isophane &  regular) ..... Inject 25 units subcutaneously before breakfast and 20 units before the evening meal.    Lisinopril-hydrochlorothiazide 20-25 Mg Tabs (Lisinopril-hydrochlorothiazide) .Marland Kitchen... Take 1 tab by mouth daily  Complete Medication List: 1)  Gemfibrozil 600 Mg Tabs (Gemfibrozil) .... Take one tablet by mouth two times a day . 02/23/02 2)  Metformin Hcl 1000 Mg Tabs (Metformin hcl) .... Take one tablet by mouth two times a day. 3)  Fluocinonide 0.05 % Oint (Fluocinonide) .... Apply ointment as directed two times a day 4)  Aspirin 325 Mg Tabs (Aspirin) .... Take 1 tab daily 5)  One Touch Ultra Test Strp (Glucose blood) .... To test blood glucsoe three times a day 6)  Bd Ultra-fine 33 Lancets Misc (Lancets) .... To test blood glucsoe three times a day 7)  Relion Insulin Syringe 29g X 1/2" 0.5 Ml Misc (Insulin syringe-needle u-100) .... Inject insulin two times a day as directed 8)  Relion 70/30 70-30 % Susp (Insulin isophane & regular) .... Inject 25 units subcutaneously before breakfast and 20 units before the evening meal. 9)  Lisinopril-hydrochlorothiazide 20-25 Mg Tabs (Lisinopril-hydrochlorothiazide) .... Take 1 tab by mouth daily 10)  Doxycycline Hyclate 100 Mg Caps (Doxycycline hyclate) .... Take 1 tab twice a day x  10 days  Other Orders: TLB-Hepatic/Liver Function Pnl ([id number])  Patient Instructions: 1)  Please stop by to see Rosaria Ferries on your way out. 2)  Please take antibiotic as directed- doxycyline 100 mg twice daily for 10 days. 3)  Go to ER at signs of worsening, fevers, chills, or vomiting. Prescriptions: RELION 70/30 70-30 % SUSP (INSULIN ISOPHANE & REGULAR) Inject 25 units Subcutaneously before breakfast and 20 units before the evening meal.  #1 x 11   Entered and Authorized by:   Arnette Norris MD   Signed by:   Arnette Norris MD on 04/07/2009   Method used:   Print then Give to Patient   RxID:   [id number] LISINOPRIL-HYDROCHLOROTHIAZIDE 20-25 MG  TABS  (LISINOPRIL-HYDROCHLOROTHIAZIDE) Take 1 tab by mouth daily  #90 x 3   Entered and Authorized by:   Arnette Norris MD   Signed by:   Arnette Norris MD on 04/07/2009   Method used:   Print then Give to Patient   RxID:   [id number] [place] X 1/2" 0.5 ML MISC (INSULIN SYRINGE-NEEDLE U-100) Inject insulin two times a day as directed  #100 x 11   Entered and Authorized by:   Arnette Norris MD   Signed by:   Arnette Norris MD on 04/07/2009   Method used:   Print then Give to Patient   RxID:   [id number] BD ULTRA-FINE 33 LANCETS  MISC (LANCETS) to test blood glucsoe three times a day  #1 box x 11   Entered and Authorized by:   Arnette Norris MD   Signed by:   Tanja Port  Deborra Medina MD on 04/07/2009   Method used:   Print then Give to Patient   RxID:   [id number] ONE TOUCH ULTRA TEST  STRP (GLUCOSE BLOOD) to test blood glucsoe three times a day  #1 box of 100 x 11   Entered and Authorized by:   Arnette Norris MD   Signed by:   Arnette Norris MD on 04/07/2009   Method used:   Print then Give to Patient   RxID:   [id number] METFORMIN HCL 1000 MG TABS (METFORMIN HCL) take one tablet by mouth two times a day.  #120 x 11   Entered and Authorized by:   Arnette Norris MD   Signed by:   Arnette Norris MD on 04/07/2009   Method used:   Print then Give to Patient   RxID:   [id number] GEMFIBROZIL 600 MG TABS (GEMFIBROZIL) take one tablet by mouth two times a day . 02/23/02  #60 Each x 10   Entered and Authorized by:   Arnette Norris MD   Signed by:   Arnette Norris MD on 04/07/2009   Method used:   Print then Give to Patient   RxID:   [id number] DOXYCYCLINE HYCLATE 100 MG CAPS (DOXYCYCLINE HYCLATE) Take 1 tab twice a day x  10 days  #20 x 0   Entered and Authorized by:   Arnette Norris MD   Signed by:   Arnette Norris MD on 04/07/2009   Method used:   Electronically to        [id number] (retail)       [address] [place], [place]  [address]       Ph:  [phone]       Fax: [phone]   RxID:   [id number]   Current Allergies (reviewed today): ! PCN

## 2010-02-23 NOTE — Assessment & Plan Note (Signed)
Summary: MED REFILL/ROGERS/VS   Vital Signs:  Patient Profile:   77 Years Old Male Height:     66.5 inches (168.91 cm) Weight:      205.04 pounds (93.20 kg) BMI:     32.72 Temp:     97.5 degrees F (36.39 degrees C) oral Pulse rate:   55 / minute BP sitting:   132 / 62  (left arm)  Pt. in pain?   no  Vitals Entered By: Sander Nephew RN (February 16, 2008 9:55 AM)              Is Patient Diabetic? Yes Did you bring your meter with you today? No Nutritional Status BMI of > 30 = obese CBG Result 274  Have you ever been in a relationship where you felt threatened, hurt or afraid?No   Does patient need assistance? Functional Status Self care Ambulation Normal     Chief Complaint:  Needs refills on meds.  History of Present Illness: Damon [name] is a 77 yo man with PMH as outlined in chart.  He is here today for refill of his medications.  He is without any complaitns.  States sugars tend to run high.  This morning is was 176 before breakfast.  He reports they usually run about 120s.  No symptoms of hypoglycemia reported.  Denies any nervousness, shakiness, dizziness, diaphoresis, weakness, etc..    Updated Prior Medication List: GEMFIBROZIL 600 MG TABS (GEMFIBROZIL) take one tablet by mouth two times a day . 02/23/02 METFORMIN HCL 1000 MG TABS (METFORMIN HCL) take one tablet by mouth two times a day. FLUOCINONIDE 0.05 % OINT (FLUOCINONIDE) apply ointment as directed two times a day LISINOPRIL-HYDROCHLOROTHIAZIDE 20-12.5 MG TABS (LISINOPRIL-HYDROCHLOROTHIAZIDE) Take 1 tablet by mouth once a day ASPIRIN 325 MG TABS (ASPIRIN) take 1 tab daily RELION 70/30 70-30 % SUSP (INSULIN ISOPHANE & REG (HUMAN)) Inject 25 units subcutaneously before breakfast and 20 units before evening meal ONE TOUCH ULTRA TEST  STRP (GLUCOSE BLOOD) to test blood glucsoe three times a day BD ULTRA-FINE 33 LANCETS  MISC (LANCETS) to test blood glucsoe three times a day  Current Allergies: ! 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   Head:     normocephalic and atraumatic.   Eyes:     vision grossly intact, pupils equal, pupils round, and pupils reactive to light.  anicteric Neck:     no JVD and no carotid bruits.   Lungs:     normal respiratory effort, no accessory muscle use, normal breath sounds, no crackles, and no wheezes.   Heart:     Very distant heart sounds.  normal rate, regular rhythm, no murmur, no gallop, and no rub.   Abdomen:     soft, non-tender,  normal bowel sounds, and no distention.   Extremities:     no edema Neurologic:     alert & oriented X3, cranial nerves II-XII intact, strength normal in all extremities, and gait normal.   Skin:     very dry skin on palms of hands, somewhat erythematous (chronic) Psych:     Oriented X3, memory intact for recent and remote, normally interactive, good eye contact, not anxious appearing, and not depressed appearing.    Diabetes Management Exam:    Foot Exam (with socks and/or shoes not present):       Sensory-Monofilament:          Left foot: normal          Right foot: normal    Impression & Recommendations:  Problem # 1:  DIABETES MELLITUS, TYPE II (ICD-250.00) A1c at goal today, will not make any changes.   Has not seen Butch Penny in some time, but prefers not to. Since A1c improved and at goal, will continue current regimen. Foot exam today. Obtain ophthalmology note, just seen few weeks ago and has follow up in April. BP  virtually at goal LDL at goal Microalbuminuria, but on ACEI  The following medications were removed from the medication list:    Glipizide 5 Mg Tb24 (Glipizide) .Marland Kitchen... Take 1 tablet by mouth once a day  His updated medication list for this problem includes:    Metformin Hcl 1000 Mg Tabs (Metformin hcl) .Marland Kitchen... Take one tablet by mouth two times a day.    Lisinopril-hydrochlorothiazide 20-12.5 Mg Tabs (Lisinopril-hydrochlorothiazide) .Marland Kitchen... Take 1 tablet by mouth once a day    Aspirin 325 Mg Tabs (Aspirin) .Marland Kitchen... Take 1 tab daily    Relion 70/30 70-30 % Susp (Insulin isophane & reg (human)) ..... Inject 25 units subcutaneously before breakfast and 20 units before evening meal  Orders: T- Capillary Blood Glucose [id number]) T-Hgb A1C (in-house) [id number])  Labs Reviewed: HgBA1c: 7.0 (02/16/2008)   Creat: 1.36 (01/28/2007)   Microalbumin: 1.58 (04/18/2007)   Problem # 2:  HYPERTENSION (ICD-401.9) Virtually at goal for diabetes.  No changes at this time Labs due on follow up visit (04/2008).  His updated medication list for this problem includes:    Lisinopril-hydrochlorothiazide 20-12.5 Mg Tabs (Lisinopril-hydrochlorothiazide) .Marland Kitchen... Take 1 tablet by mouth once a day  BP today: 132/62 Prior BP: 130/62 (04/18/2007)  Labs Reviewed: Creat: 1.36 (01/28/2007) Chol: 130 (05/15/2007)   HDL: 37 (05/15/2007)   LDL: 78 (05/15/2007)   TG: 73 (05/15/2007)   Problem # 3:  HYPERTRIGLYCERIDEMIA (ICD-272.1) At goal, no changes at this time. Has recent labs.  His updated medication list for this problem includes:    Gemfibrozil 600 Mg Tabs (Gemfibrozil) .Marland Kitchen... Take one tablet by mouth two times a day . 02/23/02  Labs Reviewed: Chol: 130 (05/15/2007)   HDL: 37 (05/15/2007)   LDL: 78 (05/15/2007)   TG: 73 (05/15/2007) SGOT: 14 (08/07/2006)   SGPT: 13 (08/07/2006)   Complete Medication List: 1)  Gemfibrozil 600 Mg Tabs (Gemfibrozil) .... Take one tablet by mouth two times a day . 02/23/02 2)  Metformin  Hcl 1000 Mg Tabs (Metformin hcl) .... Take one tablet by mouth two times a day. 3)  Fluocinonide 0.05 % Oint (Fluocinonide) .... Apply ointment as directed two times a day 4)  Lisinopril-hydrochlorothiazide 20-12.5 Mg Tabs (Lisinopril-hydrochlorothiazide) .... Take 1 tablet by mouth once a day 5)  Aspirin 325 Mg Tabs (Aspirin) .... Take 1 tab daily 6)  Relion 70/30 70-30 % Susp (Insulin isophane &  reg (human)) .... Inject 25 units subcutaneously before breakfast and 20 units before evening meal 7)  One Touch Ultra Test Strp (Glucose blood) .... To test blood glucsoe three times a day 8)  Bd Ultra-fine 33 Lancets Misc (Lancets) .... To test blood glucsoe three times a day   Patient Instructions: 1)  Please schedule a follow-up appointment in 3 months for diabetes recheck. 2)  Make sure to bring your meter next time to see how your sugars are doing. 3)  Continue all your medicines as listed below. 4)  I have refilled your insulin and all your medicines have refills until January of next year. 5)  Overall you are doing well, Keep up the good work.   Prescriptions: RELION 70/30 70-30 % SUSP (INSULIN ISOPHANE & REG (HUMAN)) Inject 25 units subcutaneously before breakfast and 20 units before evening meal  #1 month x 11   Entered and Authorized by:   Alphia Moh MD   Signed by:   Alphia Moh MD on 02/16/2008   Method used:   Electronically to        [facility] (retail)       [address] [place], [place]  [address]       Ph: [phone]       Fax: [phone]   RxID:   [id number]    Last LDL:                                                 78 (05/15/2007 8:38:00 PM)        Diabetic Foot Exam Foot Inspection Is there a history of a foot ulcer?              No Is there a foot ulcer now?              No Can the patient see the bottom of their feet?          Yes Are the shoes appropriate in style and fit?           Yes Is there swelling or an abnormal foot shape?          No Are the toenails long?                Yes Are the toenails thick?                Yes Are the toenails ingrown?              No Is there heavy callous build-up?              Yes Is there a claw toe deformity?                          No Do you have pain in calf while walking?           No      Comments: Bunion on left and right feet. Callus build up on side of left foot.  Callus buildup bottom of feet. Dryness of both feet bottoms.   High Risk Feet? Yes   10-g (5.07) Semmes-Weinstein Monofilament Test Performed by: Sander Nephew RN  Right Foot          Left Foot Visual Inspection               Test Control      normal         normal Site 1         normal         normal Site 2         normal         normal Site 3         normal         normal Site 4         normal         normal Site 5         normal         normal Site 6         normal         normal Site 7         normal         normal Site 8         normal         normal Site 9         normal         normal Site 10         normal         normal  Impression      normal         normal  Laboratory Results   Blood Tests   Date/Time Received: February 16, 2008 10:15 AM. Date/Time Reported: Maryan Rued  February 16, 2008 10:15 AM  HGBA1C: 7.0%   (Normal Range: Non-Diabetic - 3-6%   Control Diabetic - 6-8%) CBG Random:: 274mg /dL

## 2010-02-23 NOTE — Progress Notes (Signed)
Summary: refill//kg  Phone Note Call from Patient   Caller: Spouse Call For: Milta Deiters MD Reason for Call: Refill Medication Summary of Call: Request for Insulin syringes.  Will add syringes to medication list and ask PCP to approve.    New/Updated Medications: RELION INSULIN SYRINGE 29G X 1/2" 0.5 ML MISC (INSULIN SYRINGE-NEEDLE U-100) Inject insulin two times a day as directed   Prescriptions: RELION INSULIN SYRINGE 29G X 1/2" 0.5 ML MISC (INSULIN SYRINGE-NEEDLE U-100) Inject insulin two times a day as directed  #100 x 11   Entered by:   Nadine Counts ([place])   Authorized by:   Milta Deiters MD   Signed by:   Milta Deiters MD on 04/02/2008   Method used:   Electronically to        [id number] (retail)       [address], [place]       [place], [place]  [address]       Ph: [phone]       Fax: [phone]   RxID:   [id number]

## 2010-02-23 NOTE — Progress Notes (Signed)
Summary: refill/ hla  Phone Note From Pharmacy   Summary of Call: ptis transferring scripts needs to change relion insulin to novolin 70/30 insulin, can we do this...also they have gemfibrozil on back order, do we need to substitute it? Initial call taken by: Freddy Finner RN,  May 14, 2008 4:33 PM  Follow-up for Phone Call        Help me with this.  Is relion the same thing as 70/30? Please ask the new pharmacist to tell me EXACTLY what to prescribe that is identical to the old script. Follow-up by: Milta Deiters MD,  May 18, 2008 10:24 AM  Additional Follow-up for Phone Call Additional follow up Details #1::        relion is a [facility] brand insulin [facility] does not carry it Additional Follow-up by: Freddy Finner RN,  May 19, 2008 5:03 PM    Additional Follow-up for Phone Call Additional follow up Details #2::    Your response does not answer my question.  The reason I capitalized EXACTLY is that I do not know the exact equivalent of the new vs the old brands.  Please ask the new pharmacist to tell me what to prescribe, and I will sign it.  Milta Deiters MD  May 20, 2008 9:08 AM   Additional Follow-up for Phone Call Additional follow up Details #3:: Details for Additional Follow-up Action Taken: relion 70/30 is the EXACT equivalent  Additional Follow-up by: Freddy Finner RN,  May 27, 2008 10:05 AM    OK.  Please call in the new name for the same old medicine at the same dose and with the re-named syringes that match.

## 2010-02-23 NOTE — Progress Notes (Signed)
Summary: [facility]  Called patient regarding numerous of fax on this patient to get his medication through [facility]. the patient states that he will only get his diabetic supplies from them and all of his oral and insulin  medication from [facility]. Called [facility] spoke with Hinton Dyer informed her of the patient decison to get medication from [facility], however, have them supply diabetic testing supplies. She "documented" the chart to state that. Antony Blackbird  Jun 20, 2007 12:07 PM

## 2010-02-23 NOTE — Assessment & Plan Note (Signed)
Summary: CHECKUP/SB.   Vital Signs:  Patient profile:   77 year old male Height:      66.5 inches (168.91 cm) Weight:      206.0 pounds (93.64 kg) Temp:     97.5 degrees F oral Pulse rate:   57 / minute BP sitting:   124 / 58  (right arm)  Vitals Entered By: Nadine Counts Deborra Medina) (April 09, 2008 9:34 AM) Is Patient Diabetic? Yes  Pain Assessment Patient in pain? no      Nutritional Status BMI of > 30 = obese  Have you ever been in a relationship where you felt threatened, hurt or afraid?No   Does patient need assistance? Functional Status Self care Ambulation Normal Comments routine f/u   History of Present Illness: 53 man with DM.  Has no complaints.  Preventive Screening-Counseling & Management     Smoking Status: never     Smoking Cessation Counseling: yes     Cans of tobacco/week: yes  Allergies: 1)  ! Pcn   Impression & Recommendations:  Problem # 1:  HYPERTRIGLYCERIDEMIA (ICD-272.1) On gemfibrozil.  Only 2 readings in EMR are < 100. HDL = 37 LDL = 78   His updated medication list for this problem includes:    Gemfibrozil 600 Mg Tabs (Gemfibrozil) .Marland Kitchen... Take one tablet by mouth two times a day . 02/23/02  Problem # 2:  HYPERTENSION (ICD-401.9) 124/58.  Cor tones very quiet.  Lungs clear. No CV symptoms.  Bmet today   His updated medication list for this problem includes:    Lisinopril-hydrochlorothiazide 20-12.5 Mg Tabs (Lisinopril-hydrochlorothiazide) .Marland Kitchen... Take 1 tablet by mouth once a day  Orders: T-Basic Metabolic Panel ([id number])  Problem # 3:  DIABETES MELLITUS, TYPE II (ICD-250.00) On meds as listed  Last A1c = 7.0 in 1-10. Keeps eye appoints with Dr. Delman Cheadle twice a year. Urine alb = 43 last year.  Will repeat.  Creat 1.36 in 1-09.  Repeat today. On meds as listed.  No hypoglycemic symptoms ever. No CV symptoms.  Never smoked.   His updated medication list for this problem includes:    Metformin Hcl 1000 Mg Tabs (Metformin hcl) .Marland Kitchen...  Take one tablet by mouth two times a day.    Lisinopril-hydrochlorothiazide 20-12.5 Mg Tabs (Lisinopril-hydrochlorothiazide) .Marland Kitchen... Take 1 tablet by mouth once a day    Aspirin 325 Mg Tabs (Aspirin) .Marland Kitchen... Take 1 tab daily    Relion 70/30 70-30 % Susp (Insulin isophane & reg (human)) ..... Inject 25 units subcutaneously before breakfast and 20 units before evening meal  Orders: T-Urine Microalbumin w/creat. ratio [id number] / [id number])  Problem # 4:  Preventive Health Care (ICD-V70.0) Flatly refuses colonoscopy.  No family hx.  Complete Medication List: 1)  Gemfibrozil 600 Mg Tabs (Gemfibrozil) .... Take one tablet by mouth two times a day . 02/23/02 2)  Metformin Hcl 1000 Mg Tabs (Metformin hcl) .... Take one tablet by mouth two times a day. 3)  Fluocinonide 0.05 % Oint (Fluocinonide) .... Apply ointment as directed two times a day 4)  Lisinopril-hydrochlorothiazide 20-12.5 Mg Tabs (Lisinopril-hydrochlorothiazide) .... Take 1 tablet by mouth once a day 5)  Aspirin 325 Mg Tabs (Aspirin) .... Take 1 tab daily 6)  Relion 70/30 70-30 % Susp (Insulin isophane & reg (human)) .... Inject 25 units subcutaneously before breakfast and 20 units before evening meal 7)  One Touch Ultra Test Strp (Glucose blood) .... To test blood glucsoe three times a day 8)  Bd Ultra-fine 33 Lancets Misc (Lancets) .Marland KitchenMarland KitchenMarland Kitchen  To test blood glucsoe three times a day 9)  Relion Insulin Syringe 29g X 1/2" 0.5 Ml Misc (Insulin syringe-needle u-100) .... Inject insulin two times a day as directed  Patient Instructions: 1)  Please schedule a follow-up appointment in 4 months.

## 2010-02-23 NOTE — Assessment & Plan Note (Signed)
Summary: Social Work/Medicare D   Social Work Evaluation Date  04/24/2006 Patient name Damon [name]   Home [phone]  Work phone: [phone]  Cell phone: .  Marland Kitchen     Alternate phone: . Marland Kitchen       Individual making referral: Damon [name]  Primary Reason for Referral:     Assist with Medicare D/Medicaid Eligibility/Medication Assist Comments Question of Medicare D coverage.  Wife has [place] but somewhere along the line patient has lost his Medicare D coverage.  They have switched pharmacies to [facility].  [facility] and [facility] their previous [facility] and it appears that when they switched to [facility] they had coverage issues.   Their income is around 2,000 per month without patient working and so they would not be eligible for any sort of  subsidy plan. Action taken by Social Work: Met with patient and wife in [place] office to help in sorting out Medicare D issues. [facility] and [facility] had a [facility]IN [facility] which means that W[place] had Medicare D coverage back in 1/07.  [facility] could not provide name of carrier or plan. I've instructed his wife to call 1[employer] and get Damon [name] back on a plan and make sure she has his medications in front of her or call [employer] to get a price quote.  I will follow-up with the patient and wife to ensure he has gotten back on a Medicare D  plan.  I will also try and call Medicare and see if they will provide me any information.

## 2010-02-23 NOTE — Assessment & Plan Note (Signed)
Summary: 2 WK F/U DLO   Vital Signs:  Patient profile:   77 year old male Height:      65 inches Weight:      201.13 pounds BMI:     33.59 Temp:     98.6 degrees F oral Pulse rate:   60 / minute Pulse rhythm:   regular BP sitting:   120 / 70  (right arm) Cuff size:   regular  Vitals Entered By: Sherrian Divers CMA Deborra Medina) (April 20, 2009 10:32 AM) CC: two week follow up   History of Present Illness: 77 yo here for follow up diabetic ulcers. Seen on 3/17 for two ulcers, Stage 2 ulcer on left  foot, stage 1 ulcer on right.  MRI was negative for osteo. Sent to wound clinic, has been following up with them regularly.  Daughter has been helping with dressing changes, has not noticed any foul  odor. No pain, no fevers, no chills. No nausea, vomiting, or diarrhea.  Current Medications (verified): 1)  Gemfibrozil 600 Mg Tabs (Gemfibrozil) .... Take One Tablet By Mouth Two Times A Day . 02/23/02 2)  Metformin Hcl 1000 Mg Tabs (Metformin Hcl) .... Take One Tablet By Mouth Two Times A Day. 3)  Fluocinonide 0.05 % Oint (Fluocinonide) .... Apply Ointment As Directed Two Times A Day 4)  Aspirin 325 Mg Tabs (Aspirin) .... Take 1 Tab Daily 5)  One Touch Ultra Test  Strp (Glucose Blood) .... To Test Blood Glucsoe Three Times A Day 6)  Bd Ultra-Fine 33 Lancets  Misc (Lancets) .... To Test Blood Glucsoe Three Times A Day 7)  Relion Insulin Syringe 29g X 1/2" 0.5 Ml Misc (Insulin Syringe-Needle U-100) .... Inject Insulin Two Times A Day As Directed 8)  Relion 70/30 70-30 % Susp (Insulin Isophane & Regular) .... Inject 25 Units Subcutaneously Before Breakfast and 20 Units Before The Evening Meal. 9)  Lisinopril-Hydrochlorothiazide 20-25 Mg  Tabs (Lisinopril-Hydrochlorothiazide) .... Take 1 Tab By Mouth Daily  Allergies: 1)  ! Pcn  Review of Systems      See HPI General:  Denies chills, fever, loss of appetite, and malaise. GI:  Denies diarrhea, nausea, and vomiting.  Physical Exam  General:   alert, NAD Afebrile, normotensive. Msk:  Left foot: well healing ulceron bunyon 1st metatarsal head,  no discharge, no odor. right  base of foot- ulcer almost completely healed Psych:  Oriented X3, memory intact for recent and remote, normally interactive, good eye contact, not anxious appearing, and not depressed appearing.     Impression & Recommendations:  Problem # 1:  DIABETIC FOOT ULCER, LEFT (ICD-250.80) Assessment Improved Much improved. Diabetic shoes order and will continue to follow with wound center. His updated medication list for this problem includes:    Metformin Hcl 1000 Mg Tabs (Metformin hcl) .Marland Kitchen... Take one tablet by mouth two times a day.    Aspirin 325 Mg Tabs (Aspirin) .Marland Kitchen... Take 1 tab daily    Relion 70/30 70-30 % Susp (Insulin isophane & regular) ..... Inject 25 units subcutaneously before breakfast and 20 units before the evening meal.    Lisinopril-hydrochlorothiazide 20-25 Mg Tabs (Lisinopril-hydrochlorothiazide) .Marland Kitchen... Take 1 tab by mouth daily  Complete Medication List: 1)  Gemfibrozil 600 Mg Tabs (Gemfibrozil) .... Take one tablet by mouth two times a day . 02/23/02 2)  Metformin Hcl 1000 Mg Tabs (Metformin hcl) .... Take one tablet by mouth two times a day. 3)  Fluocinonide 0.05 % Oint (Fluocinonide) .... Apply ointment as directed two  times a day 4)  Aspirin 325 Mg Tabs (Aspirin) .... Take 1 tab daily 5)  One Touch Ultra Test Strp (Glucose blood) .... To test blood glucsoe three times a day 6)  Bd Ultra-fine 33 Lancets Misc (Lancets) .... To test blood glucsoe three times a day 7)  Relion Insulin Syringe 29g X 1/2" 0.5 Ml Misc (Insulin syringe-needle u-100) .... Inject insulin two times a day as directed 8)  Relion 70/30 70-30 % Susp (Insulin isophane & regular) .... Inject 25 units subcutaneously before breakfast and 20 units before the evening meal. 9)  Lisinopril-hydrochlorothiazide 20-25 Mg Tabs (Lisinopril-hydrochlorothiazide) .... Take 1 tab by mouth  daily  Current Allergies (reviewed today): ! PCN

## 2010-02-23 NOTE — Assessment & Plan Note (Signed)
Summary: remove skin tags/dlo   Vital Signs:  Patient profile:   77 year old male Height:      65 inches Weight:      192.75 pounds BMI:     32.19 Temp:     97.6 degrees F oral Pulse rate:   68 / minute Pulse rhythm:   regular BP sitting:   130 / 70  (left arm) Cuff size:   regular  Vitals Entered By: Sherrian Divers CMA Deborra Medina) (January 18, 2010 11:28 AM)  Allergies: 1)  ! Pcn   Complete Medication List: 1)  Gemfibrozil 600 Mg Tabs (Gemfibrozil) .... Take one tablet by mouth two times a day . 02/23/02 2)  Metformin Hcl 1000 Mg Tabs (Metformin hcl) .... Take one tablet by mouth two times a day. 3)  Fluocinonide 0.05 % Oint (Fluocinonide) .... Apply ointment as directed two times a day 4)  Aspirin 325 Mg Tabs (Aspirin) .... Take 1 tab daily 5)  One Touch Ultra Test Strp (Glucose blood) .... To test blood glucsoe three times a day 6)  Bd Ultra-fine 33 Lancets Misc (Lancets) .... To test blood glucsoe three times a day 7)  Relion Insulin Syringe 29g X 1/2" 0.5 Ml Misc (Insulin syringe-needle u-100) .... Inject insulin two times a day as directed 8)  Relion 70/30 70-30 % Susp (Insulin isophane & regular) .... Inject 25 units subcutaneously before breakfast and 20 units before the evening meal. 9)  Lisinopril-hydrochlorothiazide 20-25 Mg Tabs (Lisinopril-hydrochlorothiazide) .... Take 1 tab by mouth daily 10)  Alprazolam 0.25 Mg Tabs (Alprazolam) .Marland Kitchen.. 1 tab by mouth three times a day as needed for anxiety  Other Orders: Removal of Skin Tags up to 15 Lesions (11200) Prescriptions: RELION 70/30 70-30 % SUSP (INSULIN ISOPHANE & REGULAR) Inject 25 units Subcutaneously before breakfast and 20 units before the evening meal.  #3 x 11   Entered and Authorized by:   Arnette Norris MD   Signed by:   Arnette Norris MD on 01/18/2010   Method used:   Electronically to        [facility] (mail-order)       [place]  [address]       Ph: [phone]  Fax: [phone]   [id number] LISINOPRIL-HYDROCHLOROTHIAZIDE 20-25 MG  TABS (LISINOPRIL-HYDROCHLOROTHIAZIDE) Take 1 tab by mouth daily  #90 x 3   Entered and Authorized by:   Arnette Norris MD   Signed by:   Arnette Norris MD on 01/18/2010   Method used:   Electronically to        [facility] (mail-order)       [place]  [address]       Ph: [phone]       Fax: [phone]   [id number] RELION INSULIN SYRINGE 29G X 1/2" 0.5 ML MISC (INSULIN SYRINGE-NEEDLE U-100) Inject insulin two times a day as directed  #100 x 11   Entered and Authorized by:   Arnette Norris MD   Signed by:   Arnette Norris MD on 01/18/2010   Method used:   Electronically to        [facility] (mail-order)       [place]  [address]       Ph: [phone]  Fax: [phone]   [id number] BD ULTRA-FINE 33 LANCETS  MISC (LANCETS) to test blood glucsoe three times a day  #1 box x 11   Entered and Authorized by:   Arnette Norris MD   Signed by:   Arnette Norris MD on 01/18/2010   Method used:   Electronically to        [facility] ([place]  [address]       Ph: [phone]       Fax: [phone]   [id number] ONE TOUCH ULTRA TEST  STRP (GLUCOSE BLOOD) to test blood glucsoe three times a day  #1 box of 100 x 11   Entered and Authorized by:   Arnette Norris MD   Signed by:   Arnette Norris MD on 01/18/2010   Method used:   Electronically to        [facility] (mail-order)       [place]  [address]       Ph: [phone]       Fax: [phone]   [id number] METFORMIN HCL 1000 MG TABS (METFORMIN HCL) take one tablet by mouth two times a day.  #120 x 11   Entered and Authorized by:   Arnette Norris MD   Signed by:   Arnette Norris MD on 01/18/2010   Method used:   Electronically to          [facility] ([place]  [address]       Ph: [phone]       Fax: [phone]   [id number] GEMFIBROZIL 600 MG TABS (GEMFIBROZIL) take one tablet by mouth two times a day . 02/23/02  #60 Each x 10   Entered and Authorized by:   Arnette Norris MD   Signed by:   Arnette Norris MD on 01/18/2010   Method used:   Electronically to        [facility] (mail-order)       [place]  [address]       Ph: [phone]       Fax: [phone]   [id number]    Orders Added: 1)  Removal of Skin Tags up to 15 Lesions [11200]    Procedure Note Last Tetanus: given (11/22/2008)  Skin Tag Removal:  Procedure # 1: skin tag(s) removed    Region: dorsal    Location: right arm    # lesions removed: 2    Anesthesia: 0.5 ml 1% lidocaine w/epinephrine    Comment: tolerated procedure well.  consent form signed.  skin tags sent to path.  Cleaned and prepped with: alcohol and betadine Wound dressing: bandaid and pressure dressing

## 2010-02-23 NOTE — Progress Notes (Signed)
Summary: insulin has changed  Phone Note Refill Request Call back at Home Phone [phone] Message from:  Fax from Pharmacy on January 04, 2009 4:10 PM  Refills Requested: Medication #1:  RELION 70/30 70-30 % SUSP Inject 25 units Subcutaneously before breakfast and 20 units before the evening meal.. Suzie Portela, [place]  Phone:   [phone]   Method Requested: Electronic Initial call taken by: Christena Deem CMA Deborra Medina),  January 04, 2009 4:13 PM Caller: Spouse Call For: Arnette Norris MD Summary of Call: Wife called regarding Natnael, his Novolin 70/30 has changed and she can get it from the pharmacy. She was wanting you to call something else in to [facility] on [address].  Initial call taken by: Lacretia Nicks,  January 04, 2009 2:29 PM    New/Updated Medications: RELION 70/30 70-30 % SUSP (INSULIN ISOPHANE & REGULAR) Inject 25 units Subcutaneously before breakfast and 20 units before the evening meal. Prescriptions: RELION 70/30 70-30 % SUSP (INSULIN ISOPHANE & REGULAR) Inject 25 units Subcutaneously before breakfast and 20 units before the evening meal.  #1 x 11   Entered and Authorized by:   Arnette Norris MD   Signed by:   Arnette Norris MD on 01/04/2009   Method used:   Electronically to        [id number] (retail)       [address], [place], [place]  [address]       Ph: [phone]       Fax: [phone]   RxID:   [id number]

## 2010-02-23 NOTE — Miscellaneous (Signed)
  Clinical Lists Changes  Medications: Removed medication of NOVOLOG MIX 70/30  SUSP (INSULIN ASP PROT & ASP (HUM) SUSP) inject 20 unit subcutaneously in the morning and at bedtime

## 2010-02-23 NOTE — Medication Information (Signed)
Summary: RX SOLUTION  RX SOLUTION   Imported By: Garlan Fillers 04/30/2007 09:24:48  _____________________________________________________________________  External Attachment:    Type:   Image     Comment:   External Document

## 2010-02-23 NOTE — Progress Notes (Signed)
Summary: Refill/gh  Phone Note Refill Request Message from:  Pharmacy on January 14, 2007 11:31 AM  Refills Requested: Medication #1:  LISINOPRIL-HYDROCHLOROTHIAZIDE 20-12.5 MG TABS Take 1 tablet by mouth once a day   Last Refilled: 12/11/2006  Method Requested: Electronic Initial call taken by: Sander Nephew RN,  January 14, 2007 11:31 AM  Follow-up for Phone Call        Refill approved-nurse to complete  No visit or lab since last summer.  Will fill for 2 months only.  Please arrange appointment. Follow-up by: Milta Deiters MD,  January 14, 2007 2:34 PM      Prescriptions: LISINOPRIL-HYDROCHLOROTHIAZIDE 20-12.5 MG TABS (LISINOPRIL-HYDROCHLOROTHIAZIDE) Take 1 tablet by mouth once a day  #31 x 1   Entered and Authorized by:   Milta Deiters MD   Signed by:   Milta Deiters MD on 01/14/2007   Method used:   Electronically sent to ...       [id number]       [address] [place], [place]  [address]       Ph: [phone]       Fax: [phone]   RxID:   [id number]     Appended Document: Refill/gh Pt. called to make an appt. also flag sent  to  [facility].

## 2010-02-23 NOTE — Progress Notes (Signed)
Summary: phone/gg  Phone Note Call from Patient   Caller: Patient Summary of Call: Pt called and would like to be seen for "bad cold" please advise Initial call taken by: Gevena Cotton RN,  March 30, 2008 9:04 AM  Additional Follow-up for Phone Call Additional follow up Details #1::        Appt Scheduled Additional Follow-up by: Gevena Cotton RN,  March 30, 2008 9:49 AM

## 2010-02-23 NOTE — Progress Notes (Signed)
Summary: refill/ hla  Phone Note Refill Request Message from:  Fax from Pharmacy on February 19, 2008 11:05 AM  Refills Requested: Medication #1:  LISINOPRIL-HYDROCHLOROTHIAZIDE 20-12.5 MG TABS Take 1 tablet by mouth once a day   Last Refilled: 12/28  Medication #2:  METFORMIN HCL 1000 MG TABS take one tablet by mouth two times a day.   Last Refilled: 12/28  Medication #3:  GEMFIBROZIL 600 MG TABS take one tablet by mouth two times a day . 02/23/02   Last Refilled: 12/28 Initial call taken by: Freddy Finner RN,  February 19, 2008 11:05 AM  Follow-up for Phone Call        Rxs called to pharmacy with one additional refill.  Pt has f/u appt scheduled for March 19th at 9:30am.  Will forward to MD for signature.Nadine Counts [facility])  February 20, 2008 3:34 PM  Follow-up by: Nadine Counts [facility]),  February 20, 2008 3:34 PM

## 2010-02-23 NOTE — Assessment & Plan Note (Signed)
Summary: F/U DM//KG              CBG Result 157 Comments above cbg was fasting   Prior Medications: GEMFIBROZIL 600 MG TABS (GEMFIBROZIL) take one tablet by mouth two times a day . 02/23/02 NOVOLOG MIX 70/30  SUSP (INSULIN ASP PROT & ASP (HUM) SUSP) inject 20 unit subcutaneously in the morning and at bedtime METFORMIN HCL 1000 MG TABS (METFORMIN HCL) take one tablet by mouth two times a day. FLUOCINONIDE 0.05 % OINT (FLUOCINONIDE) apply ointment as directed two times a day LISINOPRIL-HYDROCHLOROTHIAZIDE 20-12.5 MG TABS (LISINOPRIL-HYDROCHLOROTHIAZIDE) Take 1 tablet by mouth once a day ASPIRIN 325 MG TABS (ASPIRIN) take 1 tab daily Current Allergies: ! PCN    DIABETES SELF MANAGEMENT TRAINING  Support person(s) present: wife Comments: Taisto has no concerns today. We dscussed possibly increasing his morning insulin to 25 units and he wanted to know what he should do when he starts back to work an dis more physiclly active. See today's phone note for detail about insurance/insulin name/type Behavior change agreed on this visit: none.  2- Exercise: out of work and does little physical activity. said he was willing to take a walk daily. 3-Nutritional Management: not interested in changing. eats what wife fixes with reportedly little snacking. 4- Medications: takes what his wife draws up - not sure if Novolin or Novolog Mix- he takes after his evening meal and I note our medication list says before bedtime.  I reiterated again today with pt and wife that this insulin should be taken prior to the meal.15 or 30 minutes depending on which insulin he's using.Marland Kitchen 5- Self-monitoring Blood Glucose: CBGs are high prelunch and pre/post evening  meal and mostly 100's fasting. A1C today is 7.0%. Wife does use his meter and she identified all the lows on his printout as being hers.  7-Preventing,detecting treating acute complications: no complaints of high or low symptoms.   Follow-up/plan: discuss  adjusting insulin with Dr. Stann Mainland. ..................................................................Marland KitchenBarnabas Harries  April 24, 2006 2:20 PM

## 2010-02-23 NOTE — Assessment & Plan Note (Signed)
Summary: EST-CK/FU/MEDS/CFB   Vital Signs:  Patient Profile:   77 Years Old Male Height:     66.5 inches (168.91 cm) Weight:      204.9 pounds (93.14 kg) Temp:     97.3 degrees F (36.28 degrees C) oral Pulse rate:   59 / minute BP sitting:   140 / 66  (left arm)  Vitals Entered By: Nadine Counts Deborra Medina) (January 28, 2007 1:59 PM)             CBG Result 216     History of Present Illness: 54 man with DM  Current Allergies: ! PCN        Impression & Recommendations:  Problem # 1:  HYPERTENSION (ICD-401.9) BP slightly above diabetic ideal but was much lower last time. No CV sx. Lungs clear.  Cor reg w/0 murmur.  No edema.  His updated medication list for this problem includes:    Lisinopril-hydrochlorothiazide 20-12.5 Mg Tabs (Lisinopril-hydrochlorothiazide) .Marland Kitchen... Take 1 tablet by mouth once a day  Orders: T-Basic Metabolic Panel (99991111)   Problem # 2:  DIABETES MELLITUS, TYPE II (ICD-250.00) Doing very well.  A1c = 7.8.  Download shows most BGs around 150.  No low BG spells. Sees eye doc twice a year.  Had cataracts out last year. Feet OK in July. Urine alb = 24mg  per Gram creat. in July 08 Lipids OK in 2008 (April).   His updated medication list for this problem includes:    Metformin Hcl 1000 Mg Tabs (Metformin hcl) .Marland Kitchen... Take one tablet by mouth two times a day.    Lisinopril-hydrochlorothiazide 20-12.5 Mg Tabs (Lisinopril-hydrochlorothiazide) .Marland Kitchen... Take 1 tablet by mouth once a day    Aspirin 325 Mg Tabs (Aspirin) .Marland Kitchen... Take 1 tab daily    Relion 70/30 70-30 % Susp (Insulin isophane & reg (human)) ..... Inject 20 units subcutaneously before breakfast and 20 units before evening meal  Orders: T-Hgb A1C (in-house) [id number]) T- Capillary Blood Glucose (Q000111Q) T-Basic Metabolic Panel (99991111)   Problem # 3:  Preventive Health Care (ICD-V70.0) Flatly refuses colonoscopy or other CA screening.  Complete Medication List: 1)  Gemfibrozil  600 Mg Tabs (Gemfibrozil) .... Take one tablet by mouth two times a day . 02/23/02 2)  Metformin Hcl 1000 Mg Tabs (Metformin hcl) .... Take one tablet by mouth two times a day. 3)  Fluocinonide 0.05 % Oint (Fluocinonide) .... Apply ointment as directed two times a day 4)  Lisinopril-hydrochlorothiazide 20-12.5 Mg Tabs (Lisinopril-hydrochlorothiazide) .... Take 1 tablet by mouth once a day 5)  Aspirin 325 Mg Tabs (Aspirin) .... Take 1 tab daily 6)  Relion 70/30 70-30 % Susp (Insulin isophane & reg (human)) .... Inject 20 units subcutaneously before breakfast and 20 untis before evening meal 7)  One Touch Ultra Test Strp (Glucose blood) .... To test blood glucsoe three times a day 8)  Bd Ultra-fine 33 Lancets Misc (Lancets) .... To test blood glucsoe three times a day   Patient Instructions: 1)  Please schedule a follow-up appointment in 6 months.    Prescriptions: BD ULTRA-FINE 33 LANCETS  MISC (LANCETS) to test blood glucsoe three times a day  #1 box x 11   Entered and Authorized by:   Milta Deiters MD   Signed by:   Milta Deiters MD on 01/28/2007   Method used:   Electronically sent to ...       [facility] [place]  [place], [place]  [address]       Ph: [phone]       Fax: [phone]   RxID:   [id number] ONE TOUCH ULTRA TEST  STRP (GLUCOSE BLOOD) to test blood glucsoe three times a day  #1 box of 100 x 11   Entered and Authorized by:   Milta Deiters MD   Signed by:   Milta Deiters MD on 01/28/2007   Method used:   Electronically sent to ...       [facility] [place], [place], [place]  [address]       Ph: [phone]       Fax: [phone]   RxID:   [id number] RELION 70/30 70-30 % SUSP (INSULIN ISOPHANE & REG (HUMAN)) Inject 20 units subcutaneously before breakfast and 20 untis before evening meal  #2 x 11   Entered and Authorized by:    Milta Deiters MD   Signed by:   Milta Deiters MD on 01/28/2007   Method used:   Electronically sent to ...       [facility] [place], [place], [place]  [address]       Ph: [phone]       Fax: [phone]   RxID:   [id number] LISINOPRIL-HYDROCHLOROTHIAZIDE 20-12.5 MG TABS (LISINOPRIL-HYDROCHLOROTHIAZIDE) Take 1 tablet by mouth once a day  #31 x 11   Entered and Authorized by:   Milta Deiters MD   Signed by:   Milta Deiters MD on 01/28/2007   Method used:   Electronically sent to ...       [facility] [place], [address] [place], [place]  [address]       Ph: [phone]       Fax: [phone]   RxID:   [id number] METFORMIN HCL 1000 MG TABS (METFORMIN HCL) take one tablet by mouth two times a day.  #62 x 11   Entered and Authorized by:   Milta Deiters MD   Signed by:   Milta Deiters MD on 01/28/2007   Method used:   Electronically sent to ...       [facility] [place], [address] [place], [place]  [address]       Ph: [phone]       Fax: [phone]   RxID:   [id number] GEMFIBROZIL 600 MG TABS (GEMFIBROZIL) take one tablet by mouth two times a day . 02/23/02  #62 x 11   Entered and Authorized by:   Milta Deiters MD   Signed by:   Milta Deiters MD on 01/28/2007   Method used:   Electronically sent to ...       [facility] [place], [place]       [place], [place]  [address]  Ph: [phone]       Fax: [phone]   [id number]  ]  Last LDL:                                                 76 (04/24/2006 8:08:00 PM)      Laboratory Results   Blood Tests   Date/Time Recieved: January 28, 2007 2:17 PM  Date/Time Reported: ..................................................................Marland KitchenMelvia Heaps   January 28, 2007 2:17 PM   HGBA1C: 7.8%   (Normal Range: Non-Diabetic - 3-6%   Control Diabetic - 6-8%) CBG Random: 216

## 2010-02-23 NOTE — Progress Notes (Signed)
----   Converted from flag ---- ---- 01/22/2007 3:35 PM, Enedina Finner wrote: Pt sch to see Dr. Stann Mainland on 01/28/07  ---- 01/14/2007 5:38 PM, Morrison Old RN wrote: needs an appt. for med refills per  Dr. Stann Mainland ------------------------------

## 2010-02-23 NOTE — Assessment & Plan Note (Signed)
Summary: 1 month follow up/ alc   Vital Signs:  Patient profile:   77 year old male Height:      65 inches Weight:      199.50 pounds BMI:     33.32 Temp:     98.3 degrees F oral Pulse rate:   76 / minute Pulse rhythm:   regular BP sitting:   112 / 50  (left arm) Cuff size:   regular  Vitals Entered By: Christena Deem CMA Deborra Medina) (May 23, 2009 12:03 PM) CC: 1 month follow up   History of Present Illness: 77 yo here for follow up diabetic ulcers. Seen on 3/17 for two ulcers, Stage 2 ulcer on left  foot, stage 1 ulcer on right.  MRI was negative for osteo. Sent to wound clinic, has been following up with them regularly.  Daughter has been helping with dressing changes, has not noticed any foul  odor. No pain, no fevers, no chills. No nausea, vomiting, or diarrhea.  Since here last month, all ulcers have essentially healed.  Last a1c was 7.8 in 02/2009.  Fast CBGs still around 120-150.  Current Medications (verified): 1)  Gemfibrozil 600 Mg Tabs (Gemfibrozil) .... Take One Tablet By Mouth Two Times A Day . 02/23/02 2)  Metformin Hcl 1000 Mg Tabs (Metformin Hcl) .... Take One Tablet By Mouth Two Times A Day. 3)  Fluocinonide 0.05 % Oint (Fluocinonide) .... Apply Ointment As Directed Two Times A Day 4)  Aspirin 325 Mg Tabs (Aspirin) .... Take 1 Tab Daily 5)  One Touch Ultra Test  Strp (Glucose Blood) .... To Test Blood Glucsoe Three Times A Day 6)  Bd Ultra-Fine 33 Lancets  Misc (Lancets) .... To Test Blood Glucsoe Three Times A Day 7)  Relion Insulin Syringe 29g X 1/2" 0.5 Ml Misc (Insulin Syringe-Needle U-100) .... Inject Insulin Two Times A Day As Directed 8)  Relion 70/30 70-30 % Susp (Insulin Isophane & Regular) .... Inject 25 Units Subcutaneously Before Breakfast and 20 Units Before The Evening Meal. 9)  Lisinopril-Hydrochlorothiazide 20-25 Mg  Tabs (Lisinopril-Hydrochlorothiazide) .... Take 1 Tab By Mouth Daily  Allergies: 1)  ! Pcn  Review of Systems      See  HPI General:  Denies chills, fatigue, and fever. GI:  Denies bloody stools, nausea, and vomiting.  Physical Exam  General:  alert, NAD Afebrile, normotensive. Mouth:  pharynx pink and moist, no erythema, and no exudates.  edentulous.   Lungs:  normal respiratory effort, no crackles, and no wheezes.   Heart:   regular rhythm, no murmur, and no gallop.   Extremities:  No clubbing, cyanosis, edema, or deformity noted with normal full range of motion of all joints.    Diabetes Management Exam:    Foot Exam (with socks and/or shoes not present):       Sensory-Pinprick/Light touch:          Left medial foot (L-4): diminished          Left dorsal foot (L-5): diminished          Left lateral foot (S-1): diminished          Right medial foot (L-4): diminished          Right dorsal foot (L-5): diminished          Right lateral foot (S-1): diminished       Sensory-Monofilament:          Left foot: diminished  Right foot: diminished       Inspection:          Left foot: abnormal          Right foot: abnormal   Impression & Recommendations:  Problem # 1:  DIABETIC FOOT ULCER, LEFT (ICD-250.80) Assessment Improved Discussed with daughter, needs to keep close eye on his feet especially since he has diminished sensation. His updated medication list for this problem includes:    Metformin Hcl 1000 Mg Tabs (Metformin hcl) .Marland Kitchen... Take one tablet by mouth two times a day.    Aspirin 325 Mg Tabs (Aspirin) .Marland Kitchen... Take 1 tab daily    Relion 70/30 70-30 % Susp (Insulin isophane & regular) ..... Inject 25 units subcutaneously before breakfast and 20 units before the evening meal.    Lisinopril-hydrochlorothiazide 20-25 Mg Tabs (Lisinopril-hydrochlorothiazide) .Marland Kitchen... Take 1 tab by mouth daily  Complete Medication List: 1)  Gemfibrozil 600 Mg Tabs (Gemfibrozil) .... Take one tablet by mouth two times a day . 02/23/02 2)  Metformin Hcl 1000 Mg Tabs (Metformin hcl) .... Take one tablet by mouth two  times a day. 3)  Fluocinonide 0.05 % Oint (Fluocinonide) .... Apply ointment as directed two times a day 4)  Aspirin 325 Mg Tabs (Aspirin) .... Take 1 tab daily 5)  One Touch Ultra Test Strp (Glucose blood) .... To test blood glucsoe three times a day 6)  Bd Ultra-fine 33 Lancets Misc (Lancets) .... To test blood glucsoe three times a day 7)  Relion Insulin Syringe 29g X 1/2" 0.5 Ml Misc (Insulin syringe-needle u-100) .... Inject insulin two times a day as directed 8)  Relion 70/30 70-30 % Susp (Insulin isophane & regular) .... Inject 25 units subcutaneously before breakfast and 20 units before the evening meal. 9)  Lisinopril-hydrochlorothiazide 20-25 Mg Tabs (Lisinopril-hydrochlorothiazide) .... Take 1 tab by mouth daily  Current Allergies (reviewed today): ! PCN

## 2010-02-23 NOTE — Assessment & Plan Note (Signed)
Summary: cold/gg   Vital Signs:  Patient profile:   77 year old male Height:      66.5 inches (168.91 cm) Weight:      207.4 pounds (94.27 kg) BMI:     33.09 O2 Sat:      99 % Temp:     96.9 degrees F (36.06 degrees C) oral Pulse rate:   47 / minute BP sitting:   153 / 62  (left arm)  Vitals Entered By: Hilda Blades Ditzler RN (March 31, 2008 1:58 PM) Is Patient Diabetic? Yes  Pain Assessment Patient in pain? no      Nutritional Status BMI of > 30 = obese Nutritional Status Detail appetite good CBG Result 145  Have you ever been in a relationship where you felt threatened, hurt or afraid?denies   Does patient need assistance? Functional Status Self care Ambulation Normal   History of Present Illness: 77 yo M w/ upper respiratory symptoms (dry cough, sneezing, congestion) x 3 days, no fevers, no feeling of shortness of breath. Pt has been improving over last day or two and does not think he needs an antibiotic. I also saw his wife who has the same symptoms, but she hasn't recovered quite as quickly.  Pt has had a low HR in mid 50s on last few visits and has no symptoms with HR of 47 today - no dizziness, lightheadedness. Pt not on a BB. Pt checks his BS and they are usually in the mid 100s.  Preventive Screening-Counseling & Management     Smoking Status: never  Allergies: 1)  ! Pcn  Social History:    Smoking Status:  never  Physical Exam  General:  alert.   Mouth:  pharynx pink and moist, no erythema, and no exudates.   Lungs:  normal respiratory effort, no crackles, and no wheezes.   Heart:  Mildly bradycardic, regular rhythm, no murmur, and no gallop.   Abdomen:  Soft, obese   Impression & Recommendations:  Problem # 1:  URI (ICD-465.9) Pt's symptoms consistent with viral URI (afebrile, short duration). Will treat symptomatically with tussionex and mucinex. Will hold off on antibiotic as pt improving and most likely viral (although did give pt's wife an antibiotic  2/2 SOB and crackles on exam). Told pt to call back if symptoms worsen. Pt to see Dr. Stann Mainland for regular F/U next Friday.  His updated medication list for this problem includes:    Aspirin 325 Mg Tabs (Aspirin) .Marland Kitchen... Take 1 tab daily    Tussionex Pennkinetic Er 8-10 Mg/63ml Lqcr (Chlorpheniramine-hydrocodone) .Marland Kitchen... Take 5 ml every 6-8 hours as needed for cough    Mucinex 600 Mg Xr12h-tab (Guaifenesin) .Marland Kitchen... Take 1 tablet by mouth two times a day for 5 days  Problem # 2:  HYPERTRIGLYCERIDEMIA (ICD-272.1) Held off on labwork today 2/2 patient being sick. Would check FLP at next visit.  His updated medication list for this problem includes:    Gemfibrozil 600 Mg Tabs (Gemfibrozil) .Marland Kitchen... Take one tablet by mouth two times a day . 02/23/02  Labs Reviewed: Chol: 130 (05/15/2007)   HDL: 37 (05/15/2007)   LDL: 78 (05/15/2007)   TG: 73 (05/15/2007) SGOT: 14 (08/07/2006)   SGPT: 13 (08/07/2006)  Problem # 3:  DIABETES MELLITUS, TYPE II (ICD-250.00) Pt's BS in mid 100s (150s-160s). Pt did not bring sugars with him. Will continue current regimen.  His updated medication list for this problem includes:    Metformin Hcl 1000 Mg Tabs (Metformin hcl) .Marland Kitchen... Take one  tablet by mouth two times a day.    Lisinopril-hydrochlorothiazide 20-12.5 Mg Tabs (Lisinopril-hydrochlorothiazide) .Marland Kitchen... Take 1 tablet by mouth once a day    Aspirin 325 Mg Tabs (Aspirin) .Marland Kitchen... Take 1 tab daily    Relion 70/30 70-30 % Susp (Insulin isophane & reg (human)) ..... Inject 25 units subcutaneously before breakfast and 20 units before evening meal  Orders: Capillary Blood Glucose ([id number]) Fingerstick [id number])  Problem # 4:  HYPERTENSION (ICD-401.9) Pt above diabetic goal today, but sick, so will recheck at next visit and consider changes.  His updated medication list for this problem includes:    Lisinopril-hydrochlorothiazide 20-12.5 Mg Tabs (Lisinopril-hydrochlorothiazide) .Marland Kitchen... Take 1 tablet by mouth once a day  BP today:  153/62 Prior BP: 132/62 (02/16/2008)  Labs Reviewed: Creat: 1.36 (01/28/2007) Chol: 130 (05/15/2007)   HDL: 37 (05/15/2007)   LDL: 78 (05/15/2007)   TG: 73 (05/15/2007)  Complete Medication List: 1)  Gemfibrozil 600 Mg Tabs (Gemfibrozil) .... Take one tablet by mouth two times a day . 02/23/02 2)  Metformin Hcl 1000 Mg Tabs (Metformin hcl) .... Take one tablet by mouth two times a day. 3)  Fluocinonide 0.05 % Oint (Fluocinonide) .... Apply ointment as directed two times a day 4)  Lisinopril-hydrochlorothiazide 20-12.5 Mg Tabs (Lisinopril-hydrochlorothiazide) .... Take 1 tablet by mouth once a day 5)  Aspirin 325 Mg Tabs (Aspirin) .... Take 1 tab daily 6)  Relion 70/30 70-30 % Susp (Insulin isophane & reg (human)) .... Inject 25 units subcutaneously before breakfast and 20 units before evening meal 7)  One Touch Ultra Test Strp (Glucose blood) .... To test blood glucsoe three times a day 8)  Bd Ultra-fine 33 Lancets Misc (Lancets) .... To test blood glucsoe three times a day 9)  Tussionex Pennkinetic Er 8-10 Mg/79ml Lqcr (Chlorpheniramine-hydrocodone) .... Take 5 ml every 6-8 hours as needed for cough 10)  Mucinex 600 Mg Xr12h-tab (Guaifenesin) .... Take 1 tablet by mouth two times a day for 5 days  Patient Instructions: 1)  Please follow-up with Dr. Stann Mainland as scheduled. 2)  Please take cough medicine as directed. Do not drive on cough medicine. 3)  The medication list was reviewed and reconciled.  All changed / newly prescribed medications were explained.  A complete medication list was provided to the patient / caregiver.  Prescriptions: TUSSIONEX PENNKINETIC ER 8-10 MG/5ML LQCR (CHLORPHENIRAMINE-HYDROCODONE) Take 5 mL every 6-8 hours as needed for cough  #4oz x 0   Entered and Authorized by:   Darlyne Russian MD   Signed by:   Darlyne Russian MD on 03/31/2008   Method used:   Print then Give to Patient   RxID:   [id number] [place] 600 MG XR12H-TAB (GUAIFENESIN) Take 1 tablet by mouth two  times a day for 5 days  #10 x 0   Entered and Authorized by:   Darlyne Russian MD   Signed by:   Darlyne Russian MD on 03/31/2008   Method used:   Print then Give to Patient   RxID:   [id number]

## 2010-02-23 NOTE — Consult Note (Signed)
Summary: [facility]   Imported By: Bonner Puna 02/17/2008 11:33:17  _____________________________________________________________________  External Attachment:    Type:   Image     Comment:   External Document  Appended Document: [facility]    Clinical Lists Changes  Observations: Added new observation of DMEYEEXAMNXT: 05/2008 (02/18/2008 13:48) Added new observation of DIAB EYE EX: Moderate non-proliferative diabetic retinopathy.    (02/10/2008 13:49)       Diabetic Eye Exam  Procedure date:  02/10/2008  Findings:      Moderate non-proliferative diabetic retinopathy.     Procedures Next Due Date:    Diabetic Eye Exam: 05/2008   Diabetic Eye Exam  Procedure date:  02/10/2008  Findings:      Moderate non-proliferative diabetic retinopathy.     Procedures Next Due Date:    Diabetic Eye Exam: 05/2008

## 2010-02-23 NOTE — Progress Notes (Signed)
Summary: Insulin 70/30 refill//kg  Phone Note Outgoing Call Call back at [facility] Phone [phone]   Call placed by: Nadine Counts [facility]),  Jun 01, 2008 11:19 AM Summary of Call: Contacted pt's wife Murray Hodgkins regarding 70/30 insulin refill.  Pt is now getting his meds through a mail order company named [facility] [facility] ph# [phone] and fax# [phone].  Pt have requested that we call in the 70/30 insulin before pt runs out.  Rx called in as dosed on pt's chart.  Pt was previously getting Relion 70/30 from [facility] and will now be getting novolin 70/30 which is the exact equivalent.  Will change medication list to reflect this change and forward to PCP for approval.Kay Goldston,CMA Deborra Medina)  Jun 01, 2008 11:47 AM  Pt will receive 5 vials (72mth supply) for $125.Nadine Counts [facility])  Jun 01, 2008 12:18 PM   Initial call taken by: Nadine Counts [facility]),  Jun 01, 2008 11:47 AM    New/Updated Medications: NOVOLIN 70/30 70-30 % SUSP (INSULIN ISOPHANE & REGULAR) Inject 25 units subcutaneously before breakfast and 20 units before evening meal

## 2010-02-23 NOTE — Progress Notes (Signed)
Summary: Diabetes Authorization/Renewal ([facility])  Phone Note From Pharmacy Call back at [phone]   Caller: [facility] Call For: Dr. Deborra Medina  Summary of Call: Received a fax request for diabetes authorization/renewal form, called patient and left a message on machine just to verify that he uses this company and that he is aware of this.  Sherrian Divers CMA Deborra Medina)  April 19, 2009 4:01 PM   Spoke with patient's wife and [employer] is who he uses.  Form in your IN box.   Initial call taken by: Sherrian Divers CMA Deborra Medina),  April 20, 2009 8:01 AM  Follow-up for Phone Call        On my desk. Follow-up by: Arnette Norris MD,  April 20, 2009 8:03 AM  Additional Follow-up for Phone Call Additional follow up Details #1::        Form faxed to [employer] at [phone] Additional Follow-up by: Sherrian Divers CMA Deborra Medina),  April 20, 2009 10:07 AM

## 2010-02-23 NOTE — Letter (Signed)
Summary: [facility]  [facility]   Imported By: Bonner Puna 10/29/2008 14:50:13  _____________________________________________________________________  External Attachment:    Type:   Image     Comment:   External Document

## 2010-02-23 NOTE — Progress Notes (Signed)
Summary: refill/gg  Phone Note Refill Request  on October 13, 2008 3:06 PM  Refills Requested: Medication #1:  ONE TOUCH ULTRA TEST  STRP to test blood glucsoe three times a day  Method Requested: Electronic Initial call taken by: Gevena Cotton RN,  October 13, 2008 3:07 PM    Prescriptions: ONE TOUCH ULTRA TEST  STRP (GLUCOSE BLOOD) to test blood glucsoe three times a day  #1 box of 100 x 11   Entered and Authorized by:   Milta Deiters MD   Signed by:   Milta Deiters MD on 10/13/2008   Method used:   Electronically to        [facility]  [id number] [facility] (retail)       [address]       [place], [place]  [address]       Ph: [phone]       Fax: [phone]   RxID:   [id number]

## 2010-02-23 NOTE — Miscellaneous (Signed)
Summary: Consent to Skin Tag Removal  Consent to Skin Tag Removal   Imported By: Laural Benes 01/24/2010 15:04:29  _____________________________________________________________________  External Attachment:    Type:   Image     Comment:   External Document

## 2010-02-23 NOTE — Progress Notes (Signed)
Summary: samples return to shelf   Prescriptions: NOVOLOG MIX 70/30  SUSP (INSULIN ASP PROT & ASP (HUM) SUSP) inject 20 unit subcutaneously in the morning and at bedtime  #1 vial x 0   Entered and Authorized by:   Bertha Stakes MD   Signed by:   Bertha Stakes MD on 06/26/2006   Method used:   Samples Given   RxID:   [id number]   Patient Assist Medication Verification: Medication: Novolog 70/30 mix Lot#  [id number] Exp Date: 11-09 Tech approval:NLS               Call placed to patient with message that assistance medications are ready for pick-up. ..................................................................Marland KitchenAntony Blackbird  June 28, 2006 10:33 AM  medication placed back for samples, patient never followed up...................................................................Marland KitchenAntony Blackbird  August 06, 2006 2:54 PM

## 2010-02-23 NOTE — Progress Notes (Signed)
Summary: form for diabetic supplies  Phone Note From Pharmacy   Caller: [facility] Summary of Call: Form for diabetic supplies is on your desk- this is a renewal. Initial call taken by: Marty Heck CMA, AAMA,  February 01, 2010 8:31 AM  Follow-up for Phone Call        In my box. Arnette Norris MD  February 01, 2010 9:32 AM Form faxed, placed up front for scanning.  [place], AAMA  February 01, 2010 10:18 AM

## 2010-02-23 NOTE — Assessment & Plan Note (Signed)
Summary: NEW PT TO EST/MK   Vital Signs:  Patient profile:   77 year old male Height:      65 inches Weight:      209.25 pounds BMI:     34.95 Temp:     98 degrees F oral Pulse rate:   84 / minute Pulse rhythm:   regular BP sitting:   142 / 72  (left arm) Cuff size:   large  Vitals Entered By: Christena Deem CMA ([place]) (November 22, 2008 2:10 PM) CC: New Patient to Establish   History of Present Illness: 77 yo here to establish care.    1.  DM-  Hg1c in 09/2008 was 8.4.  Has not been checking his sugars three times a day because strips are so expensive.  Has only been checking them qam, which have been ranging 120-200.  Taking insulin 30 munits qam and 20 units at bedtime.  No hypoglyemia.  See eye doctor yearly.  Had pneumovax in 2009.  Cr stable for 2 years.    2.  HTN- previously controlled according to office records, elevated today.  No HA, CP, SOB.  No blurred vision.  On Lisinopril-HCTZ 20-12.5 mg daily.  Well man- has not had lipid panel in 2 years.  Never had Zostavax.  Tetanus is out of date.  Refuses colonoscopy.    Current Medications (verified): 1)  Gemfibrozil 600 Mg Tabs (Gemfibrozil) .... Take One Tablet By Mouth Two Times A Day . 02/23/02 2)  Metformin Hcl 1000 Mg Tabs (Metformin Hcl) .... Take One Tablet By Mouth Two Times A Day. 3)  Fluocinonide 0.05 % Oint (Fluocinonide) .... Apply Ointment As Directed Two Times A Day 4)  Lisinopril-Hydrochlorothiazide 20-12.5 Mg Tabs (Lisinopril-Hydrochlorothiazide) .... Take 1 Tablet By Mouth Once A Day 5)  Aspirin 325 Mg Tabs (Aspirin) .... Take 1 Tab Daily 6)  Novolin 70/30 70-30 % Susp (Insulin Isophane & Regular) .... Inject 30 Units Subcutaneously Before Breakfast and 20 Units Before Evening Meal 7)  One Touch Ultra Test  Strp (Glucose Blood) .... To Test Blood Glucsoe Three Times A Day 8)  Bd Ultra-Fine 33 Lancets  Misc (Lancets) .... To Test Blood Glucsoe Three Times A Day 9)  Relion Insulin Syringe 29g X 1/2" 0.5 Ml Misc  (Insulin Syringe-Needle U-100) .... Inject Insulin Two Times A Day As Directed  Allergies: 1)  ! 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  Receives $500/ month from them.  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  Does not consider himself retired and is always trying to get work.  No success in past 9 months. Lives with wife in [place].  8 grown children, 16 grandchildren, 6 great grandchildren.  Family History: Dad- had MI in 56s Mom - died in 58s  Social History: Worked for a large Journalist, newspaper for 37 years.  Receives $500/ month from them.  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  Does not consider himself retired and is always trying to get work.  No success in past 9 months. Lives with wife in [place].  8 grown children, 16 grandchildren, 6 great grandchildren.  Review of Systems  See HPI General:  Denies weakness and weight loss. Eyes:  Denies blurring and halos. ENT:  Denies difficulty swallowing, nasal congestion, and sinus pressure. CV:  Denies chest pain or discomfort, difficulty breathing at night, and difficulty breathing while lying down. Resp:  Denies shortness of breath. GI:  Denies abdominal pain, bloody stools, and change in bowel habits. MS:  Denies muscle weakness. Derm:  Denies changes in color of skin. Neuro:  Denies tremors, visual disturbances, and weakness. Psych:  Denies anxiety and depression. Heme:  Denies fevers and pallor.  Physical Exam  General:  alert.   Eyes:  vision grossly intact, pupils equal, pupils round, and  pupils reactive to light.  anicteric Mouth:  pharynx pink and moist, no erythema, and no exudates.  edentulous.   Neck:  no JVD and no carotid bruits.   Lungs:  normal respiratory effort, no crackles, and no wheezes.   Heart:   regular rhythm, no murmur, and no gallop.   Abdomen:  Soft, obese Extremities:  No clubbing, cyanosis, edema, or deformity noted with normal full range of motion of all joints.   Neurologic:  No cranial nerve deficits noted. Station and gait are normal. Plantar reflexes are down-going bilaterally. DTRs are symmetrical throughout. Sensory, motor and coordinative functions appear intact. Skin:  very dry skin on palms of hands Psych:  Oriented X3, memory intact for recent and remote, normally interactive, good eye contact, not anxious appearing, and not depressed appearing.     Impression & Recommendations:  Problem # 1:  DIABETES MELLITUS, TYPE II (ICD-250.00) Assessment Unchanged Advised to check his sugars two times a day if possible, keep a log that we will go over when he comes in for repeat a1c in December.  Pt in agreement. His updated medication list for this problem includes:    Metformin Hcl 1000 Mg Tabs (Metformin hcl) .Marland Kitchen... Take one tablet by mouth two times a day.    Lisinopril-hydrochlorothiazide 20-12.5 Mg Tabs (Lisinopril-hydrochlorothiazide) .Marland Kitchen... Take 1 tablet by mouth once a day    Aspirin 325 Mg Tabs (Aspirin) .Marland Kitchen... Take 1 tab daily    Novolin 70/30 70-30 % Susp (Insulin isophane & regular) ..... Inject 30 units subcutaneously before breakfast and 20 units before evening meal  Problem # 2:  HYPERTENSION (ICD-401.9) Assessment: Deteriorated Elevated today.  Asked him to check at local pharmacy a couple of times a week, keep a log to bring in the next appt. His updated medication list for this problem includes:    Lisinopril-hydrochlorothiazide 20-12.5 Mg Tabs (Lisinopril-hydrochlorothiazide) .Marland Kitchen... Take 1 tablet by mouth once a day  Problem # 3:   Preventive Health Care (ICD-V70.0) Assessment: Comment Only Reviewed preventive care protocols, scheduled due services, and updated immunizations Discussed nutrition, exercise, diet, and healthy lifestyle.  zostavax and tetanus shot today. FLP, BMET.  Complete Medication List: 1)  Gemfibrozil 600 Mg Tabs (Gemfibrozil) .... Take one tablet by mouth two times a day . 02/23/02 2)  Metformin Hcl 1000 Mg Tabs (Metformin hcl) .... Take one tablet by mouth two times a day. 3)  Fluocinonide 0.05 % Oint (Fluocinonide) .... Apply ointment as directed two times a day 4)  Lisinopril-hydrochlorothiazide 20-12.5 Mg Tabs (Lisinopril-hydrochlorothiazide) .... Take 1 tablet by mouth once a day 5)  Aspirin 325 Mg Tabs (Aspirin) .... Take 1 tab daily 6)  Novolin 70/30 70-30 % Susp (Insulin isophane & regular) .... Inject 30 units subcutaneously before breakfast and 20 units before evening meal 7)  One Touch Ultra Test Strp (Glucose  blood) .... To test blood glucsoe three times a day 8)  Bd Ultra-fine 33 Lancets Misc (Lancets) .... To test blood glucsoe three times a day 9)  Relion Insulin Syringe 29g X 1/2" 0.5 Ml Misc (Insulin syringe-needle u-100) .... Inject insulin two times a day as directed  Other Orders: TD Toxoids IM 7 YR + [id number]) Admin 1st Vaccine [id number]) Zoster (Shingles) Vaccine Live [phone]) Admin of Any Addtl Vaccine [id number])  Patient Instructions: 1)  Nice to meet you and your wife Mr. Janowiak. 2)  Please make an appointment on the way out for labs when you have not eaten for: 3)  fasting lipid panel V72.19,  BMET 250.00 4)  Have a great day! Prescriptions: BD ULTRA-FINE 33 LANCETS  MISC (LANCETS) to test blood glucsoe three times a day  #1 box x 11   Entered and Authorized by:   Arnette Norris MD   Signed by:   Arnette Norris MD on 11/22/2008   Method used:   Electronically to        [facility] (retail)       [place],  [place]  [address]       Ph: [phone]       Fax: [phone]   RxID:   [id number] ONE TOUCH ULTRA TEST  STRP (GLUCOSE BLOOD) to test blood glucsoe three times a day  #1 box of 100 x 11   Entered and Authorized by:   Arnette Norris MD   Signed by:   Arnette Norris MD on 11/22/2008   Method used:   Electronically to        [facility]  [id number] [facility] (retail)       [address] [place], [place]  [address]       Ph: [phone]       Fax: [phone]   RxID:   [id number]   Current Allergies (reviewed today): ! PCN TD Result Date:  11/22/2008 TD Result:  given TD Next Due:  10 yr Pneumovax Result Date:  07/11/2007 Pneumovax Result:  historical Herpes Zoster Result Date:  11/22/2008 Herpes Zoster Result:  given Flex Sig Next Due:  Refused Colonoscopy Next Due:  Refused Hemoccult Next Due:  Refused    Immunizations Administered:  Tetanus Vaccine:    Vaccine Type: Td    Site: right deltoid    Mfr: Sanofi Pasteur    Dose: 0.5 ml    Route: IM    Given by: Christena Deem CMA ([place])    Exp. Date: 05/05/2010    Lot #: [id number]    VIS given: 12/10/06 version given November 22, 2008.  Zostavax # 1:    Vaccine Type: Zostavax    Site: left deltoid    Mfr: Merck    Dose: 0.5 ml    Route: [place]    Given by: Christena Deem CMA ([place])    Exp. Date: 05/19/2009    Lot #: [id number]    VIS given: 11/03/04 given November 22, 2008.

## 2010-02-23 NOTE — Assessment & Plan Note (Signed)
Summary: F/U LABWORK/CLE   Vital Signs:  Patient profile:   77 year old male Height:      65 inches Weight:      193.25 pounds BMI:     32.27 Temp:     97.7 degrees F oral Pulse rate:   80 / minute Pulse rhythm:   regular BP sitting:   130 / 70  (right arm) Cuff size:   regular  Vitals Entered By: Sherrian Divers CMA Deborra Medina) (December 29, 2009 11:20 AM) CC: follow up after labs   History of Present Illness: 77 yo here for follow up diabetes.  Murray Hodgkins, his wife died recently.  He is doing ok, misses her but has strong family support. He is here with his daughter today.  Last a1c was 7.8 in 02/2009, now 7.2.  Fast CBGs still around 120-150. Denies any episodes of hypoglycemia.   No CP or SOB. No recurrent foot ulcers.  Has lost 6 pounds, not eating as much but appetite slowly increasing.  HTN- BP stalbe on Lisinopril-HCTZ 20-25 mg daily.  No CP, blurred vision, HA or SOB.  No LE edema.  Current Medications (verified): 1)  Gemfibrozil 600 Mg Tabs (Gemfibrozil) .... Take One Tablet By Mouth Two Times A Day . 02/23/02 2)  Metformin Hcl 1000 Mg Tabs (Metformin Hcl) .... Take One Tablet By Mouth Two Times A Day. 3)  Fluocinonide 0.05 % Oint (Fluocinonide) .... Apply Ointment As Directed Two Times A Day 4)  Aspirin 325 Mg Tabs (Aspirin) .... Take 1 Tab Daily 5)  One Touch Ultra Test  Strp (Glucose Blood) .... To Test Blood Glucsoe Three Times A Day 6)  Bd Ultra-Fine 33 Lancets  Misc (Lancets) .... To Test Blood Glucsoe Three Times A Day 7)  Relion Insulin Syringe 29g X 1/2" 0.5 Ml Misc (Insulin Syringe-Needle U-100) .... Inject Insulin Two Times A Day As Directed 8)  Relion 70/30 70-30 % Susp (Insulin Isophane & Regular) .... Inject 25 Units Subcutaneously Before Breakfast and 20 Units Before The Evening Meal. 9)  Lisinopril-Hydrochlorothiazide 20-25 Mg  Tabs (Lisinopril-Hydrochlorothiazide) .... Take 1 Tab By Mouth Daily 10)  Alprazolam 0.25 Mg Tabs (Alprazolam) .Marland Kitchen.. 1 Tab By Mouth Three  Times A Day As Needed For Anxiety  Allergies: 1)  ! Pcn  Past History:  Past Medical History: Last updated: 12/04/2005 Diabetes mellitus, type II Hypertension hyperkeratotoic rash obesity hypertriglyceridemia- LDL 74 Third nerve palsy, total- H/O -05/23/98 - resolved 06/23/98 Diabetic neuropathy - mild sensory  Illiteracy  Review of Systems      See HPI General:  Denies malaise. Psych:  Denies sense of great danger, suicidal thoughts/plans, thoughts of violence, unusual visions or sounds, and thoughts /plans of harming others. Endo:  Complains of weight change; denies excessive thirst and excessive urination.  Physical Exam  General:  alert, NAD Afebrile, normotensive. Mouth:  pharynx pink and moist, no erythema, and no exudates.  edentulous.   Lungs:  normal respiratory effort, no crackles, and no wheezes.   Heart:   regular rhythm, no murmur, and no gallop.   Extremities:  No clubbing, cyanosis, edema, or deformity noted with normal full range of motion of all joints.   Psych:  Oriented X3, memory intact for recent and remote, normally interactive, good eye contact, not anxious appearing, and not depressed appearing.    Diabetes Management Exam:    Foot Exam (with socks and/or shoes not present):       Sensory-Pinprick/Light touch:  Left medial foot (L-4): diminished          Left dorsal foot (L-5): diminished          Left lateral foot (S-1): diminished          Right medial foot (L-4): diminished          Right dorsal foot (L-5): diminished          Right lateral foot (S-1): diminished       Sensory-Monofilament:          Left foot: diminished          Right foot: diminished       Inspection:          Left foot: abnormal             Comments: caluses, no active ulcers          Right foot: abnormal             Comments: caluses, no active ulcers       Nails:          Left foot: thickened          Right foot: thickened   Impression &  Recommendations:  Problem # 1:  HYPERTENSION (ICD-401.9) Assessment Unchanged Stable, continue current medications. His updated medication list for this problem includes:    Lisinopril-hydrochlorothiazide 20-25 Mg Tabs (Lisinopril-hydrochlorothiazide) .Marland Kitchen... Take 1 tab by mouth daily  Problem # 2:  DIABETES MELLITUS, TYPE II (ICD-250.00) Assessment: Improved Continue current meds, follow up in 3 months. His updated medication list for this problem includes:    Metformin Hcl 1000 Mg Tabs (Metformin hcl) .Marland Kitchen... Take one tablet by mouth two times a day.    Aspirin 325 Mg Tabs (Aspirin) .Marland Kitchen... Take 1 tab daily    Relion 70/30 70-30 % Susp (Insulin isophane & regular) ..... Inject 25 units subcutaneously before breakfast and 20 units before the evening meal.    Lisinopril-hydrochlorothiazide 20-25 Mg Tabs (Lisinopril-hydrochlorothiazide) .Marland Kitchen... Take 1 tab by mouth daily  Problem # 3:  GRIEF REACTION, ACUTE (ICD-309.0) Assessment: New Appropriate, seems to be doing ok. Good support system. Living alone but daughter checks on him frequently.  Complete Medication List: 1)  Gemfibrozil 600 Mg Tabs (Gemfibrozil) .... Take one tablet by mouth two times a day . 02/23/02 2)  Metformin Hcl 1000 Mg Tabs (Metformin hcl) .... Take one tablet by mouth two times a day. 3)  Fluocinonide 0.05 % Oint (Fluocinonide) .... Apply ointment as directed two times a day 4)  Aspirin 325 Mg Tabs (Aspirin) .... Take 1 tab daily 5)  One Touch Ultra Test Strp (Glucose blood) .... To test blood glucsoe three times a day 6)  Bd Ultra-fine 33 Lancets Misc (Lancets) .... To test blood glucsoe three times a day 7)  Relion Insulin Syringe 29g X 1/2" 0.5 Ml Misc (Insulin syringe-needle u-100) .... Inject insulin two times a day as directed 8)  Relion 70/30 70-30 % Susp (Insulin isophane & regular) .... Inject 25 units subcutaneously before breakfast and 20 units before the evening meal. 9)  Lisinopril-hydrochlorothiazide 20-25 Mg  Tabs (Lisinopril-hydrochlorothiazide) .... Take 1 tab by mouth daily 10)  Alprazolam 0.25 Mg Tabs (Alprazolam) .Marland Kitchen.. 1 tab by mouth three times a day as needed for anxiety  Patient Instructions: 1)  Please come see me in 3 months. 2)  Please bring in pill bottles to your next appointment. 3)  I am so sorry about Ms. Murray Hodgkins.   Orders Added: 1)  Est. Patient Level IV [id number]    Current Allergies (reviewed today): ! PCN

## 2010-02-23 NOTE — Assessment & Plan Note (Signed)
Summary: FU VISIT/DS   Vital Signs:  Patient Profile:   77 Years Old Male Height:     66.5 inches (168.91 cm) Weight:      197.8 pounds (89.91 kg) BMI:     31.56 Temp:     98.6 degrees F (37.00 degrees C) oral Pulse rate:   60 / minute BP sitting:   118 / 58  (right arm)  Pt. in pain?   no  Vitals Entered By: Nadine Counts Deborra Medina) (August 07, 2006 1:53 PM)              Is Patient Diabetic? Yes  Nutritional Status BMI of > 30 = obese CBG Result 173  Have you ever been in a relationship where you felt threatened, hurt or afraid?No   Does patient need assistance? Functional Status Self care Ambulation Normal   Chief Complaint:  routine ck chronic issues.  History of Present Illness: 6 man with diabetes.  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  Takes 70/30 insulin.  Has no complaints.  Says he had cataract extraction since his March visit here.  Had good result..  Current Allergies (reviewed today): ! PCN    Risk Factors: Tobacco use:  current    Smokeless:  Yes -- 1pk/day per day    Physical Exam  Eyes:     pupils equal, pupils round, and no injection.   conjunctivae nl.  Anicteric. Neck:     supple, no masses, no thyromegaly, no HJR, and no carotid bruits.   Lungs:     normal respiratory effort, normal breath sounds, no crackles, and no wheezes.   Heart:     normal rate, regular rhythm, no murmur, and no HJR.   Abdomen:     obese.non-tender, no masses, no hepatomegaly, and no splenomegaly.   Extremities:     no edema see foot exam form.    Impression & Recommendations:  Problem # 1:  HYPERTRIGLYCERIDEMIA (P102836.1) Very good lipids in 4-08   His updated medication list for this problem includes:    Gemfibrozil 600 Mg Tabs (Gemfibrozil) .Marland Kitchen... Take one tablet by mouth two times a day . 02/23/02   Problem # 2:  DIABETES MELLITUS, TYPE II (ICD-250.00) No hypoglycemia sx.  Seems to be taking meds as prescribed.  A1c = 7.6  today.  BG = 173. Had eye visit this Spring (cataract extraction). Micro-alb = 13 in 10-07.  Will repeat. Lipids:  LDL = 76;  HDL = 42 in 4-08. Foot exam today: some diminished filament sensation. No lesions.  Pulses good and skin healthy. Does not smoke.  BP = 118/58    His updated medication list for this problem includes:    Metformin Hcl 1000 Mg Tabs (Metformin hcl) .Marland Kitchen... Take one tablet by mouth two times a day.    Lisinopril-hydrochlorothiazide 20-12.5 Mg Tabs (Lisinopril-hydrochlorothiazide) .Marland Kitchen... Take 1 tablet by mouth once a day    Aspirin 325 Mg Tabs (Aspirin) .Marland Kitchen... Take 1 tab daily    Relion 70/30 70-30 % Susp (Insulin isophane & reg (human)) ..... Inject 20 units subcutaneously before breakfast and 20 units before evening meal  Orders: T-Hgb A1C (in-house) [id number]) T- Capillary Blood Glucose [id number]) T-Comprehensive Metabolic Panel ([id number]) T-Urine Microalbumin w/creat. ratio [phone] / [id number])  His updated medication list for this problem includes:    Metformin Hcl 1000 Mg Tabs (Metformin hcl) .Marland Kitchen... Take one tablet by mouth two times a day.    Lisinopril-hydrochlorothiazide 20-12.5 Mg Tabs (Lisinopril-hydrochlorothiazide) .Marland KitchenMarland KitchenMarland KitchenMarland Kitchen  Take 1 tablet by mouth once a day    Aspirin 325 Mg Tabs (Aspirin) .Marland Kitchen... Take 1 tab daily    Relion 70/30 70-30 % Susp (Insulin isophane & reg (human)) ..... Inject 20 units subcutaneously before breakfast and 20 untis before evening meal    Patient Instructions: 1)  Please schedule a follow-up appointment in 4 months.         Last LDL:                                                 76 (04/24/2006 8:08:00 PM)      Diabetic Foot Exam Pulse Check          Right Foot          Left Foot Dorsalis Pedis:        2+            3+  High Risk Feet? No   10-g (5.07) Semmes-Weinstein Monofilament Test           Right Foot          Left Foot Visual Inspection     normal            Site 1         abnormal         abnormal Site 4          abnormal         abnormal Site 5         normal         normal Site 6         normal         abnormal   Laboratory Results   Blood Tests   Date/Time Recieved: August 07, 2006 2:00 PM  Date/Time Reported: ..................................................................Marland KitchenMelvia Heaps  August 07, 2006 2:00 PM   HGBA1C: 7.6%   (Normal Range: Non-Diabetic - 3-6%   Control Diabetic - 6-8%) CBG Random: 173        Last LDL:                                                 76 (04/24/2006 8:08:00 PM)      Diabetic Foot Exam Pulse Check          Right Foot          Left Foot Dorsalis Pedis:        2+            3+  High Risk Feet? No   10-g (5.07) Semmes-Weinstein Monofilament Test           Right Foot          Left Foot Visual Inspection     normal

## 2010-02-23 NOTE — Assessment & Plan Note (Signed)
Summary: flu shot [mkj]  Nurse Visit   Allergies: 1)  ! Pcn  Immunizations Administered:  Influenza Vaccine # 1:    Vaccine Type: Fluvax MCR    Site: left deltoid    Mfr: Novartis    Dose: 0.5 ml    Given by: Sander Nephew RN    Exp. Date: 03/22/2009    Lot #: [id number]    VIS given: 08/15/06 version given November 02, 2008.  Flu Vaccine Consent Questions:    Do you have a history of severe allergic reactions to this vaccine? no    Any prior history of allergic reactions to egg and/or gelatin? no    Do you have a sensitivity to the preservative Thimersol? no    Do you have a past history of Guillan-Barre Syndrome? no    Do you currently have an acute febrile illness? no    Have you ever had a severe reaction to latex? no    Vaccine information given and explained to patient? yes  Orders Added: 1)  Influenza Vaccine MCR [id number]

## 2010-02-23 NOTE — Assessment & Plan Note (Signed)
Summary: FLU/ SB.  Nurse Visit    Prior Medications: GEMFIBROZIL 600 MG TABS (GEMFIBROZIL) take one tablet by mouth two times a day . 02/23/02 METFORMIN HCL 1000 MG TABS (METFORMIN HCL) take one tablet by mouth two times a day. FLUOCINONIDE 0.05 % OINT (FLUOCINONIDE) apply ointment as directed two times a day LISINOPRIL-HYDROCHLOROTHIAZIDE 20-12.5 MG TABS (LISINOPRIL-HYDROCHLOROTHIAZIDE) Take 1 tablet by mouth once a day ASPIRIN 325 MG TABS (ASPIRIN) take 1 tab daily RELION 70/30 70-30 % SUSP (INSULIN ISOPHANE & REG (HUMAN)) Inject 25 units subcutaneously before breakfast and 20 units before evening meal ONE TOUCH ULTRA TEST  STRP (GLUCOSE BLOOD) to test blood glucsoe three times a day BD ULTRA-FINE 33 LANCETS  MISC (LANCETS) to test blood glucsoe three times a day GLIPIZIDE 5 MG  TB24 (GLIPIZIDE) Take 1 tablet by mouth once a day Current Allergies: ! PCN  Flu Vaccine Consent Questions     Do you have a history of severe allergic reactions to this vaccine? no    Any prior history of allergic reactions to egg and/or gelatin? no    Do you have a sensitivity to the preservative Thimersol? no    Do you have a past history of Guillan-Barre Syndrome? no    Do you currently have an acute febrile illness? no    Have you ever had a severe reaction to latex? no    Vaccine information given and explained to patient? yes    Are you currently pregnant? no    Lot Number:[id number]     Expiration date  07/22/2007    Site Given  Left Deltoid IM Sander Nephew RN  October 21, 2007 10:48 AM   Orders Added: 1)  Admin 1st Vaccine [90471] 2)  Flu Vaccine 33yrs + [place].Seats    ]  Appended Document: FLU/ SB. Expiration date on Flu Vaccine 07/21/2008. Sander Nephew, RN October 21, 2007 10:49 AM

## 2010-02-23 NOTE — Assessment & Plan Note (Signed)
Summary: ROUTINE CK/EST/VS   Vital Signs:  Patient profile:   77 year old male Height:      65 inches (165.10 cm) Weight:      208.6 pounds (94.82 kg) BMI:     34.84 Temp:     97.4 degrees F Pulse rate:   54 / minute BP sitting:   139 / 74  (left arm)  Vitals Entered By: Yvonna Alanis RN (September 24, 2008 10:09 AM) CC: check up- frustrated he cannot find work Is Patient Diabetic? Yes  Pain Assessment Patient in pain? no      Nutritional Status BMI of > 30 = obese CBG Result 168  Does patient need assistance? Functional Status Self care Ambulation Normal   Diabetic Foot Exam Foot Inspection Is there a history of a foot ulcer?              No Is there a foot ulcer now?              No Is there swelling or an abnormal foot shape?          No Are the toenails long?                Yes Are the toenails thick?                Yes Are the toenails ingrown?              No Is there heavy callous build-up?              No Is there pain in the calf muscle (Intermittent claudication) when walking?    NoIs there a claw toe deformity?              No Is there elevated skin temperature?            No Is there limited ankle dorsiflexion?            No Is there foot or ankle muscle weakness?            No  Diabetic Foot Care Education Patient educated on appropriate care of diabetic feet.  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 No   10-g (5.07) Semmes-Weinstein Monofilament Test Performed by: Yvonna Alanis RN          Right Foot          Left Foot Visual Inspection     normal           normal Site 1         normal         normal Site 2         normal         normal Site 3         normal         normal Site 4         normal         normal Site 5         normal         normal Site 6  normal          normal Site 9         normal         normal  Impression      normal         normal  Legend:  Site 1 = Plantar aspect of first toe (center of pad) Site 2 = Plantar aspect of third toe (center of pad) Site 3 = Plantar aspect of fifth toe (center of pad) Site 4 = Plantar aspect of first metatarsal head Site 5 = Plantar aspect of third metatarsal head Site 6 = Plantar aspect of fifth metatarsal head Site 7 = Plantar aspect of medial midfoot Site 8 = Plantar aspect of lateral midfoot Site 9 = Plantar aspect of heel Site 10 = dorsal aspect of foot between the base of the first and second toes   Result is Abnormal if patient was unable to perceive the monofilament at site indicated.    CC:  check up- frustrated he cannot find work.  History of Present Illness: 43 man with DM.  Preventive Screening-Counseling & Management  Alcohol-Tobacco     Smoking Status: never     Smoking Cessation Counseling: yes     Cans of tobacco/week: yes  Allergies: 1)  ! Pcn  Social History: Worked for a large [employer], [employer] for 37 years.  Receives $500/ month from them.  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  Does not consider himself retired and is always trying to get work.  No success in past 9 months.  Diabetes Management Exam:    Foot Exam (with socks and/or shoes not present):       Sensory-Monofilament:          Left foot: normal          Right foot: normal   Impression & Recommendations:  Problem # 1:  HYPERTENSION (ICD-401.9) Good control.  No CP or dyspnea.  No edema.   His updated medication list for this problem includes:    Lisinopril-hydrochlorothiazide 20-12.5 Mg Tabs (Lisinopril-hydrochlorothiazide) .Marland Kitchen... Take 1 tablet by mouth once a day  Problem # 2:  DIABETES MELLITUS, TYPE II (ICD-250.00) A1c = 8.4  Meter report shows mostly 120 to 200; all FBS.  No hypoglycemia.  Advised increase in AM insulin to 30 units and stay at 20 units at night.  Advised him to  alternate days to check BG before supper every other day. Sees eys doctor and has had cataracts out OU. Creatinine stable for 2 years.  Urine albumin in 40s each time. LDL 78 and HDL 40 twice.      His updated medication list for this problem includes:    Metformin Hcl 1000 Mg Tabs (Metformin hcl) .Marland Kitchen... Take one tablet by mouth two times a day.    Lisinopril-hydrochlorothiazide 20-12.5 Mg Tabs (Lisinopril-hydrochlorothiazide) .Marland Kitchen... Take 1 tablet by mouth once a day    Aspirin 325 Mg Tabs (Aspirin) .Marland Kitchen... Take 1 tab daily    Novolin 70/30 70-30 % Susp (Insulin isophane & regular) ..... Inject 30 units subcutaneously before breakfast and 20 units before evening meal  Orders: T- Capillary Blood Glucose (82948) T-Hgb A1C (in-house) [id number])  Complete Medication List: 1)  Gemfibrozil 600 Mg Tabs (Gemfibrozil) .... Take one tablet by mouth two times a day . 02/23/02 2)  Metformin Hcl 1000 Mg Tabs (Metformin hcl) .... Take one tablet by mouth two times a day. 3)  Fluocinonide 0.05 % Oint (Fluocinonide) .... Apply ointment as directed  two times a day 4)  Lisinopril-hydrochlorothiazide 20-12.5 Mg Tabs (Lisinopril-hydrochlorothiazide) .... Take 1 tablet by mouth once a day 5)  Aspirin 325 Mg Tabs (Aspirin) .... Take 1 tab daily 6)  Novolin 70/30 70-30 % Susp (Insulin isophane & regular) .... Inject 30 units subcutaneously before breakfast and 20 units before evening meal 7)  One Touch Ultra Test Strp (Glucose blood) .... To test blood glucsoe three times a day 8)  Bd Ultra-fine 33 Lancets Misc (Lancets) .... To test blood glucsoe three times a day 9)  Relion Insulin Syringe 29g X 1/2" 0.5 Ml Misc (Insulin syringe-needle u-100) .... Inject insulin two times a day as directed  Patient Instructions: 1)  Please schedule a follow-up appointment in 4 months. 2)  Increase morning insulin to 30 units.  Keep evening dose at 20. 3)  Please check blood sugar before supper every other day.   Prevention &  Chronic Care Immunizations   Influenza vaccine: Fluvax 3+  (10/21/2007)    Tetanus booster: Not documented    Pneumococcal vaccine: Not documented    H. zoster vaccine: Not documented  Colorectal Screening   Hemoccult: Not documented    Colonoscopy: Not documented   Colonoscopy action/deferral: Refused  (09/24/2008)  Other Screening   PSA: Not documented   PSA action/deferral: Not indicated  (09/24/2008)   Smoking status: never  (09/24/2008)  Diabetes Mellitus   HgbA1C: 8.4  (09/24/2008)    Eye exam: Moderate non-proliferative diabetic retinopathy.     (02/10/2008)   Eye exam due: 05/2008    Foot exam: yes  (09/24/2008)   Foot exam action/deferral: Do today   High risk foot: No  (09/24/2008)   Foot care education: Done  (09/24/2008)   Foot exam due: 11/07/2006    Urine microalbumin/creatinine ratio: 49.7  (04/09/2008)    Diabetes flowsheet reviewed?: Yes   Progress toward A1C goal: Deteriorated   Diabetes comments: increased AM insulin  Lipids   Total Cholesterol: 130  (05/15/2007)   LDL: 78  (05/15/2007)   LDL Direct: Not documented   HDL: 37  (05/15/2007)   Triglycerides: 73  (05/15/2007)    SGOT (AST): 14  (08/07/2006)   SGPT (ALT): 13  (08/07/2006)   Alkaline phosphatase: 92  (08/07/2006)   Total bilirubin: 0.5  (08/07/2006)    Lipid flowsheet reviewed?: Yes   Progress toward LDL goal: At goal  Hypertension   Last Blood Pressure: 139 / 74  (09/24/2008)   Serum creatinine: 1.28  (04/09/2008)   Serum potassium 4.6  (04/09/2008)    Hypertension flowsheet reviewed?: Yes   Progress toward BP goal: At goal  Self-Management Support :    Diabetes self-management support: Not documented    Hypertension self-management support: Not documented    Lipid self-management support: Not documented    Nursing Instructions: Diabetic foot exam today     Laboratory Results   Blood Tests   Date/Time Received:  Date/Time Reported:   HGBA1C: 8.4%    (Normal Range: Non-Diabetic - 3-6%   Control Diabetic - 6-8%) CBG Random:: 168mg /dL

## 2010-02-23 NOTE — Assessment & Plan Note (Signed)
Summary: checkup [mkj]   Vital Signs:  Patient Profile:   77 Years Old Male Height:     66.5 inches (168.91 cm) Weight:      207.8 pounds (94.45 kg) BMI:     33.16 Temp:     97.2 degrees F (36.22 degrees C) oral Pulse rate:   54 / minute BP sitting:   130 / 62  (right arm)  Pt. in pain?   no  Vitals Entered By: Nadine Counts Deborra Medina) (April 18, 2007 10:04 AM)              Is Patient Diabetic? Yes  CBG Result 347  Does patient need assistance? Functional Status Self care Ambulation Normal     Chief Complaint:  routine f/u diabetes.  History of Present Illness: 77 man with diabetes. Insists he never misses insulin.     Current Allergies (reviewed today): ! PCN    Risk Factors:  Tobacco use:  current    Smokeless:  Yes -- tobacco use daily per day    Counseled to quit/cut down tobacco use:  yes      Impression & Recommendations:  Problem # 1:  DIABETES MELLITUS, TYPE II (ICD-250.00) A1c = 8.1.  BG = 347 after breakfast.   Brings in meds and is apparantly taking them. Will increase 70/30 to 25 in AM and stay at 20 PM. He did not bring in meter so I cannot know details today. Says he sees Dr. Delman Cheadle (eye). BP is at goal.  Last urine alb OK; repeat.  Will try to get fasting lipids next visit. Denies all sx; very cheerful.    His updated medication list for this problem includes: 75    Metformin Hcl 1000 Mg Tabs (Metformin hcl) .Marland Kitchen... Take one tablet by mouth two times a day.    Lisinopril-hydrochlorothiazide 20-12.5 Mg Tabs (Lisinopril-hydrochlorothiazide) .Marland Kitchen... Take 1 tablet by mouth once a day    Aspirin 325 Mg Tabs (Aspirin) .Marland Kitchen... Take 1 tab daily    Relion 70/30 70-30 % Susp (Insulin isophane & reg (human)) ..... Inject 20 units subcutaneously before breakfast and 20 untis before evening meal   Orders: T- Capillary Blood Glucose (82948) T-Hgb A1C (in-house) [id number]) T-Urine Microalbumin w/creat. ratio [phone] / [id number])  His updated medication  list for this problem includes:    Metformin Hcl 1000 Mg Tabs (Metformin hcl) .Marland Kitchen... Take one tablet by mouth two times a day.    Lisinopril-hydrochlorothiazide 20-12.5 Mg Tabs (Lisinopril-hydrochlorothiazide) .Marland Kitchen... Take 1 tablet by mouth once a day    Aspirin 325 Mg Tabs (Aspirin) .Marland Kitchen... Take 1 tab daily    Relion 70/30 70-30 % Susp (Insulin isophane & reg (human)) ..... Inject 25 units subcutaneously before breakfast and 20 units before evening meal    Glipizide 5 Mg Tb24 (Glipizide) .Marland Kitchen... Take 1 tablet by mouth once a day   Complete Medication List: 1)  Gemfibrozil 600 Mg Tabs (Gemfibrozil) .... Take one tablet by mouth two times a day . 02/23/02 2)  Metformin Hcl 1000 Mg Tabs (Metformin hcl) .... Take one tablet by mouth two times a day. 3)  Fluocinonide 0.05 % Oint (Fluocinonide) .... Apply ointment as directed two times a day 4)  Lisinopril-hydrochlorothiazide 20-12.5 Mg Tabs (Lisinopril-hydrochlorothiazide) .... Take 1 tablet by mouth once a day 5)  Aspirin 325 Mg Tabs (Aspirin) .... Take 1 tab daily 6)  Relion 70/30 70-30 % Susp (Insulin isophane & reg (human)) .... Inject 25 units subcutaneously before breakfast and 20 units before evening meal  7)  One Touch Ultra Test Strp (Glucose blood) .... To test blood glucsoe three times a day 8)  Bd Ultra-fine 33 Lancets Misc (Lancets) .... To test blood glucsoe three times a day 9)  Glipizide 5 Mg Tb24 (Glipizide) .... Take 1 tablet by mouth once a day   Patient Instructions: 1)  Please schedule a follow-up appointment in 2 months. 2)  Increase morning insulin to 25 units. 3)  Bring in meter next visit.    Prescriptions: RELION 70/30 70-30 % SUSP (INSULIN ISOPHANE & REG (HUMAN)) Inject 25 units subcutaneously before breakfast and 20 units before evening meal  #1 month x 11   Entered and Authorized by:   Milta Deiters MD   Signed by:   Milta Deiters MD on 04/18/2007   Method used:   Electronically sent to ...       [facility]  [id number] [place]       [place], [place]  [address]       Ph: [phone]       Fax: [phone]   RxID:   [id number]  ] Laboratory Results   Blood Tests   Date/Time Recieved: April 18, 2007 10:33 AM Date/Time Reported: ..................................................................Marland KitchenMaryan Rued  April 18, 2007 10:33 AM  HGBA1C: 8.1%   (Normal Range: Non-Diabetic - 3-6%   Control Diabetic - 6-8%) CBG Random: 347

## 2010-02-23 NOTE — Assessment & Plan Note (Signed)
Summary: FLU SHOT/CLE  Nurse Visit   Allergies: 1)  ! Pcn  Immunizations Administered:  Influenza Vaccine # 1:    Vaccine Type: Fluvax 3+    Site: right deltoid    Mfr: GlaxoSmithKline    Dose: 0.5 ml    Route: IM    Given by: Edwin Dada CMA ([place]or[place])    Exp. Date: 07/22/2010    Lot #: [id number]    VIS given: 08/16/09 version given October 11, 2009.  Flu Vaccine Consent Questions:    Do you have a history of severe allergic reactions to this vaccine? no    Any prior history of allergic reactions to egg and/or gelatin? no    Do you have a sensitivity to the preservative Thimersol? no    Do you have a past history of Guillan-Barre Syndrome? no    Do you currently have an acute febrile illness? no    Have you ever had a severe reaction to latex? no    Vaccine information given and explained to patient? yes  Orders Added: 1)  Flu Vaccine 47yrs + [id number] 2)  Admin 1st Vaccine [id number]

## 2010-02-23 NOTE — Assessment & Plan Note (Signed)
Summary: EST-CK/FU/MEDS/CFB    History of Present Illness: Patient did not want to be seen today.   No visit.  Current Allergies: ! PCN        Complete Medication List: 1)  Gemfibrozil 600 Mg Tabs (Gemfibrozil) .... Take one tablet by mouth two times a day . 02/23/02 2)  Metformin Hcl 1000 Mg Tabs (Metformin hcl) .... Take one tablet by mouth two times a day. 3)  Fluocinonide 0.05 % Oint (Fluocinonide) .... Apply ointment as directed two times a day 4)  Lisinopril-hydrochlorothiazide 20-12.5 Mg Tabs (Lisinopril-hydrochlorothiazide) .... Take 1 tablet by mouth once a day 5)  Aspirin 325 Mg Tabs (Aspirin) .... Take 1 tab daily 6)  Relion 70/30 70-30 % Susp (Insulin isophane & reg (human)) .... Inject 20 units subcutaneously before breakfast and 20 untis before evening meal 7)  One Touch Ultra Test Strp (Glucose blood) .... To test blood glucsoe three times a day 8)  Bd Ultra-fine 33 Lancets Misc (Lancets) .... To test blood glucsoe three times a day     ] Last LDL:                                                 76 (04/24/2006 8:08:00 PM)

## 2010-02-23 NOTE — Letter (Signed)
Summary: [place] No Show Letter  [place] [facility]  [address] [place], [place] [address]   Phone: [phone]  Fax: [phone]    08/23/2009 MRN: [id number]  [facility] [address] [place], [place]  [address]   Dear Damon [name],   Our records indicate that you missed your scheduled appointment with ___lab__________________ on __8.2.11__________.  Please contact this office to reschedule your appointment as soon as possible.  It is important that you keep your scheduled appointments with your physician, so we can provide you the best care possible.  Please be advised that there may be a charge for "no show" appointments.    Sincerely,    at [facility]

## 2010-03-28 ENCOUNTER — Ambulatory Visit (INDEPENDENT_AMBULATORY_CARE_PROVIDER_SITE_OTHER): Payer: Medicare Other | Admitting: Family Medicine

## 2010-03-28 ENCOUNTER — Other Ambulatory Visit: Payer: Self-pay | Admitting: Family Medicine

## 2010-03-28 ENCOUNTER — Encounter: Payer: Self-pay | Admitting: Family Medicine

## 2010-03-28 DIAGNOSIS — I10 Essential (primary) hypertension: Secondary | ICD-10-CM

## 2010-03-28 DIAGNOSIS — E781 Pure hyperglyceridemia: Secondary | ICD-10-CM

## 2010-03-28 DIAGNOSIS — E119 Type 2 diabetes mellitus without complications: Secondary | ICD-10-CM

## 2010-03-28 LAB — BASIC METABOLIC PANEL
BUN: 27 mg/dL — ABNORMAL HIGH (ref 6–23)
CO2: 28 mEq/L (ref 19–32)
Calcium: 10 mg/dL (ref 8.4–10.5)
Chloride: 104 mEq/L (ref 96–112)
Creatinine, Ser: 1.5 mg/dL (ref 0.4–1.5)
GFR: 47.41 mL/min — ABNORMAL LOW (ref >60.00)
Glucose, Bld: 179 mg/dL — ABNORMAL HIGH (ref 70–99)
Potassium: 4.3 mEq/L (ref 3.5–5.1)
Sodium: 140 mEq/L (ref 135–145)

## 2010-03-28 LAB — LIPID PANEL
Cholesterol: 129 mg/dL (ref 0–200)
HDL: 36.5 mg/dL — ABNORMAL LOW (ref >39.00)
LDL Cholesterol: 79 mg/dL (ref 0–99)
Total CHOL/HDL Ratio: 4
Triglycerides: 67 mg/dL (ref 0.0–149.0)
VLDL: 13.4 mg/dL (ref 0.0–40.0)

## 2010-03-28 LAB — HEMOGLOBIN A1C: Hgb A1c MFr Bld: 7.2 % — ABNORMAL HIGH (ref 4.6–6.5)

## 2010-04-04 NOTE — Assessment & Plan Note (Signed)
Summary: 3 MTH F/U DLO   Vital Signs:  Patient profile:   77 year old male Height:      65 inches Weight:      190.75 pounds BMI:     31.86 Temp:     98.9 degrees F oral Pulse rate:   72 / minute Pulse rhythm:   regular BP sitting:   124 / 70  (right arm) Cuff size:   regular  Vitals Entered By: Sherrian Divers CMA Deborra Medina) (March 28, 2010 7:55 AM) CC: 3 month follow up HTN and diabetes   History of Present Illness: 77 yo here for follow up HTN, DM.  Wife Murray Hodgkins died this past winter, doing ok. Feels like he is coping well and has good family support.  Last a1c was 7.2 in 11/2009.   Says his fasting CBGs have improved, ranging 90-115. Tries to check CBGs three times a day. Denies any episodes of hypoglycemia.   No CP or SOB. No recurrent foot ulcers. On Metformin 1000 mb gid, Relion 70/30- 25 units before breakfast and 20 units before evening meal.     HTN- BP stalbe on Lisinopril-HCTZ 20-25 mg daily.  No CP, blurred vision, HA or SOB.  No LE edema.  Current Medications (verified): 1)  Gemfibrozil 600 Mg Tabs (Gemfibrozil) .... Take One Tablet By Mouth Two Times A Day . 02/23/02 2)  Metformin Hcl 1000 Mg Tabs (Metformin Hcl) .... Take One Tablet By Mouth Two Times A Day. 3)  Fluocinonide 0.05 % Oint (Fluocinonide) .... Apply Ointment As Directed Two Times A Day 4)  Aspirin 325 Mg Tabs (Aspirin) .... Take 1 Tab Daily 5)  One Touch Ultra Test  Strp (Glucose Blood) .... To Test Blood Glucsoe Three Times A Day 6)  Bd Ultra-Fine 33 Lancets  Misc (Lancets) .... To Test Blood Glucsoe Three Times A Day 7)  Relion Insulin Syringe 29g X 1/2" 0.5 Ml Misc (Insulin Syringe-Needle U-100) .... Inject Insulin Two Times A Day As Directed 8)  Relion 70/30 70-30 % Susp (Insulin Isophane & Regular) .... Inject 25 Units Subcutaneously Before Breakfast and 20 Units Before The Evening Meal. 9)  Lisinopril-Hydrochlorothiazide 20-25 Mg  Tabs (Lisinopril-Hydrochlorothiazide) .... Take 1 Tab By Mouth  Daily 10)  Alprazolam 0.25 Mg Tabs (Alprazolam) .Marland Kitchen.. 1 Tab By Mouth Three Times A Day As Needed For Anxiety  Allergies: 1)  ! 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  Receives $500/ month from them.  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  Does not consider himself retired and is always trying to get work.  No success in past 9 months. Lives with wife in [place].  8 grown children, 16 grandchildren, 6 great grandchildren.  Risk Factors: Smoking Status: never (09/24/2008) Cans of tobacco/wk: yes (09/24/2008)  Review of Systems      See HPI General:  Denies malaise. Eyes:  Denies blurring. ENT:  Denies difficulty swallowing. CV:  Denies chest pain or discomfort. Resp:  Denies shortness of breath. GI:  Denies abdominal pain and change in bowel habits. GU:  Denies incontinence, nocturia, and urinary frequency. MS:  Denies joint pain, joint redness, and  joint swelling. Derm:  Denies rash. Neuro:  Denies headaches. Psych:  Denies anxiety and depression. Endo:  Denies cold intolerance and heat intolerance. Heme:  Denies abnormal bruising and bleeding.  Physical Exam  General:  alert, NAD Afebrile, normotensive. Mouth:  pharynx pink and moist, no erythema, and no exudates.  edentulous.   Lungs:  normal respiratory effort, no crackles, and no wheezes.   Heart:   regular rhythm, no murmur, and no gallop.   Extremities:  No clubbing, cyanosis, edema, or deformity noted with normal full range of motion of all joints.    Diabetes Management Exam:    Foot Exam (with  socks and/or shoes not present):       Sensory-Pinprick/Light touch:          Left medial foot (L-4): diminished          Left dorsal foot (L-5): normal          Left lateral foot (S-1): normal          Right medial foot (L-4): diminished       Sensory-Monofilament:          Left foot: normal       Inspection:          Left foot: abnormal             Comments: calluses and dry skin          Right foot: abnormal             Comments: calluses and dry skin       Nails:          Left foot: thickened          Right foot: thickened   Impression & Recommendations:  Problem # 1:  DIABETES MELLITUS, TYPE II (ICD-250.00) Assessment Unchanged recheck a1c and BMET today. His updated medication list for this problem includes:    Metformin Hcl 1000 Mg Tabs (Metformin hcl) .Marland Kitchen... Take one tablet by mouth two times a day.    Aspirin 325 Mg Tabs (Aspirin) .Marland Kitchen... Take 1 tab daily    Relion 70/30 70-30 % Susp (Insulin isophane & regular) ..... Inject 25 units subcutaneously before breakfast and 20 units before the evening meal.    Lisinopril-hydrochlorothiazide 20-25 Mg Tabs (Lisinopril-hydrochlorothiazide) .Marland Kitchen... Take 1 tab by mouth daily  Orders: TLB-A1C / Hgb A1C (Glycohemoglobin) (83036-A1C)  Problem # 2:  HYPERTENSION (ICD-401.9) Assessment: Unchanged stable.  Continue current meds, BMET today. His updated medication list for this problem includes:    Lisinopril-hydrochlorothiazide 20-25 Mg Tabs (Lisinopril-hydrochlorothiazide) .Marland Kitchen... Take 1 tab by mouth daily  Orders: Venipuncture [id number]) TLB-BMP (Basic Metabolic Panel-BMET) (99991111)  Problem # 3:  HYPERTRIGLYCERIDEMIA (ICD-272.1) Assessment: Unchanged recheck lipid panel today. His updated medication list for this problem includes:    Gemfibrozil 600 Mg Tabs (Gemfibrozil) .Marland Kitchen... Take one tablet by mouth two times a day . 02/23/02  Orders: TLB-Lipid Panel (80061-LIPID)  Complete Medication List: 1)  Gemfibrozil 600 Mg Tabs  (Gemfibrozil) .... Take one tablet by mouth two times a day . 02/23/02 2)  Metformin Hcl 1000 Mg Tabs (Metformin hcl) .... Take one tablet by mouth two times a day. 3)  Fluocinonide 0.05 % Oint (Fluocinonide) .... Apply ointment as directed two times a day 4)  Aspirin 325 Mg Tabs (Aspirin) .... Take 1 tab daily 5)  One Touch Ultra Test Strp (Glucose blood) .... To test blood glucsoe three times a day 6)  Bd Ultra-fine 33 Lancets Misc (Lancets) .... To  test blood glucsoe three times a day 7)  Relion Insulin Syringe 29g X 1/2" 0.5 Ml Misc (Insulin syringe-needle u-100) .... Inject insulin two times a day as directed 8)  Relion 70/30 70-30 % Susp (Insulin isophane & regular) .... Inject 25 units subcutaneously before breakfast and 20 units before the evening meal. 9)  Lisinopril-hydrochlorothiazide 20-25 Mg Tabs (Lisinopril-hydrochlorothiazide) .... Take 1 tab by mouth daily 10)  Alprazolam 0.25 Mg Tabs (Alprazolam) .Marland Kitchen.. 1 tab by mouth three times a day as needed for anxiety   Orders Added: 1)  Venipuncture [36415] 2)  TLB-BMP (Basic Metabolic Panel-BMET) 123456 3)  TLB-Lipid Panel [80061-LIPID] 4)  TLB-A1C / Hgb A1C (Glycohemoglobin) [83036-A1C] 5)  Est. Patient Level IV [id number]    Current Allergies (reviewed today): ! PCN

## 2010-04-28 LAB — GLUCOSE, CAPILLARY: Glucose-Capillary: 168 mg/dL — ABNORMAL HIGH (ref 70–99)

## 2010-05-04 LAB — GLUCOSE, CAPILLARY: Glucose-Capillary: 145 mg/dL — ABNORMAL HIGH (ref 70–99)

## 2010-05-08 LAB — GLUCOSE, CAPILLARY: Glucose-Capillary: 274 mg/dL — ABNORMAL HIGH (ref 70–99)

## 2010-06-06 NOTE — Procedures (Signed)
DUPLEX DEEP VENOUS EXAM - LOWER EXTREMITY   INDICATION:  Venous insufficiency.   HISTORY:  Edema:  No.  Trauma/Surgery:  Foot surgery.  Pain:  No.  PE:  No.  Previous DVT:  No.  Anticoagulants:  No.  Other:  No.   DUPLEX EXAM:                CFV   SFV   PopV  PTV    GSV                R  L  R  L  R  L  R   L  R  L  Thrombosis    o  o  o  o  o  o  o   o  o  o  Spontaneous   +  +  +  +  +  +  +   +  +  +  Phasic        +  +  +  +  +  +  +   +  +  +  Augmentation  +  +  +  +  +  +  +   +  +  +  Compressible  +  +  +  +  +  +  +   +  +  +  Competent     +  +  +  +  +  +  +   +  +  +   Legend:  + - yes  o - no  p - partial  D - decreased   IMPRESSION:  There does not appear to be any deep venous thrombus noted  in bilateral legs.  Bilateral legs appear competent with no venous  insufficiency noted.    _____________________________  V. Leia Alf, MD   CB/MEDQ  D:  04/11/2009  T:  04/12/2009  Job:  [id number]

## 2010-06-09 NOTE — Consult Note (Signed)
NAME:  Damon [name], Damon [name]                           ACCOUNT NO.:  [id number]   MEDICAL RECORD NO.:  [id number]                   PATIENT TYPE:  REC   LOCATION:  FOOT                                 FACILITY:  [facility]   PHYSICIAN:  Orlando Penner. Sevier, [name]              DATE OF BIRTH:  02-Sep-1933   DATE OF CONSULTATION:  04/16/2002  DATE OF DISCHARGE:                                   CONSULTATION   REFERRING PHYSICIAN:  Dr. Monia Sabal. Talbot.   HISTORY:  This 77 year old white male is seen at the courtesy of Dr. Jobe Igo  for assistance with management of multiple calluses of the feet.  The  patient has been overweight for a number of years and has manifest type 2  diabetes for the past five years.  He has had longstanding hallux valgus  deformities of his feet, slightly worse on the left than on the right, and  has had progressive clawing of the toes in association with this.  He has  developed substantial calluses, both on the first MP joint areas bilaterally  and also at multiple other plantar locations on his feet.  He has never had  ulcerations.  He is referred here now for assistance with management of  these problems.   PAST MEDICAL HISTORY:  The patient has hypertriglyceridemia and hypertension  in addition to his diabetes and it would appear that he has metabolic  syndrome.   ALLERGIES:  He is allergic to PENICILLIN.   MEDICATIONS:  1. He takes Humulin NPH and Humulin Regular insulin mixed twice daily.  He     is also on Metformin 1000 mg twice day.  2. Gemfibrozil 600 mg daily.  3. Cozaar 50 mg daily.  4. Hydrochlorothiazide 25 mg daily.  5. A daily aspirin.   PHYSICAL EXAMINATION:  EXTREMITIES:  Examination today is limited to the  distal lower extremities.  The patient's feet are free of edema, but are  characterized by significant deformity which is best described as hallux  valgus configuration bilaterally, more striking on the left than on the  right.  There is clawing of  all the secondary toes, but particularly the  second toe on the left foot, and there is overlapping of the second toe  across the hallux.  Skin temperatures are normal and symmetrical.  Plantar  pulses are palpable and quite adequate.  Monofilament testing shows that he  has protective sensation throughout both feet.   His feet are extensively involved with calluses, particularly heavy in the  areas of the first metatarsal head (bunion) areas, but also on the plantar  aspects of the feet at the third and fifth metatarsal head areas and at the  heels bilaterally.  In addition, there is callus at the tip of the left  hallux just distal to the termination of the nail.   DISPOSITION:  1. The patient is given general instruction  regarding foot care and diabetes     by video with nurse and physician reinforcement.  2. All the patient's calluses are dremeled without incident and with good     relief.  3. It is recommended to the patient that because of his foot deformity and     his extensive callus formation, that he should obtain custom diabetic     inserts to be worn at all times.  He is accordingly given a prescription     for this.  4. It is discussed with the patient that he may wish to consider elective     surgery to reduce the hallux valgus and perhaps straighten one or more of     the other toes as a preventive measure rather than waiting until he has     more serious problems with his feet.  He will take this into advisement     and let us know if he would like surgical referral for that purpose.  5. The patient will be given a single visit back here to review the     appropriateness and adequacy of his inserts, once they are obtained.                                               Orlando Penner. London Pepper, [name]    RES/MEDQ  D:  04/16/2002  T:  04/17/2002  Job:  [id number]   cc:   Monia Sabal. Jobe Igo, [name]  [place]  [place] [address]  Fax: [phone]

## 2010-06-27 ENCOUNTER — Other Ambulatory Visit: Payer: Self-pay | Admitting: Family Medicine

## 2010-06-29 ENCOUNTER — Other Ambulatory Visit (INDEPENDENT_AMBULATORY_CARE_PROVIDER_SITE_OTHER): Payer: Medicare Other | Admitting: Family Medicine

## 2010-06-29 DIAGNOSIS — E119 Type 2 diabetes mellitus without complications: Secondary | ICD-10-CM

## 2010-06-29 LAB — HEMOGLOBIN A1C: Hgb A1c MFr Bld: 8.1 % — ABNORMAL HIGH (ref 4.6–6.5)

## 2010-06-30 ENCOUNTER — Encounter: Payer: Self-pay | Admitting: Family Medicine

## 2010-06-30 ENCOUNTER — Ambulatory Visit: Payer: Medicare Other | Admitting: Family Medicine

## 2010-07-03 ENCOUNTER — Encounter: Payer: Self-pay | Admitting: Family Medicine

## 2010-07-03 ENCOUNTER — Ambulatory Visit (INDEPENDENT_AMBULATORY_CARE_PROVIDER_SITE_OTHER): Payer: Medicare Other | Admitting: Family Medicine

## 2010-07-03 VITALS — BP 140/70 | HR 55 | Temp 97.8°F | Wt 193.8 lb

## 2010-07-03 DIAGNOSIS — E119 Type 2 diabetes mellitus without complications: Secondary | ICD-10-CM

## 2010-07-03 MED ORDER — INSULIN NPH ISOPHANE & REGULAR (70-30) 100 UNIT/ML ~~LOC~~ SUSP: SUBCUTANEOUS | Status: DC

## 2010-07-03 NOTE — Progress Notes (Signed)
77 yo here for follow up HTN, DM.  DM- Lab Results  Component Value Date   HGBA1C 8.1* 06/29/2010   Prior to that,  7.2 in 11/2009.  Admits to not taking his medications regularly over past few months. Daughter is here with him today and working with him- she is filling his pill boxes and syringes. No recurrent foot ulcers. On Metformin 1000 mb gid, Relion 70/30- 25 units before breakfast and 20 units before evening meal.   CBG this am was 120. Denies any episodes of hypoglycemia.  HTN- BP stalbe on Lisinopril-HCTZ 20-25 mg daily.  No CP, blurred vision, HA or SOB.  No LE edema.   Review of Systems       See HPI General:  Denies malaise. Eyes:  Denies blurring. ENT:  Denies difficulty swallowing. CV:  Denies chest pain or discomfort. Resp:  Denies shortness of breath. GI:  Denies abdominal pain and change in bowel habits. GU:  Denies incontinence, nocturia, and urinary frequency. MS:  Denies joint pain, joint redness, and joint swelling. Derm:  Denies rash. Neuro:  Denies headaches. Psych:  Denies anxiety and depression. Endo:  Denies cold intolerance and heat intolerance. Heme:  Denies abnormal bruising and bleeding.  Physical Exam BP 140/70  Pulse 55  Temp(Src) 97.8 F (36.6 C) (Oral)  Wt 193 lb 12 oz (87.884 kg)  General:  alert, NAD Afebrile, normotensive. Mouth:  pharynx pink and moist, no erythema, and no exudates.  edentulous.   Lungs:  normal respiratory effort, no crackles, and no wheezes.   Heart:   regular rhythm, no murmur, and no gallop.   Extremities:  No clubbing, cyanosis, edema, or deformity noted with normal full range of motion of all joints.    Diabetes Management Exam:     Foot Exam (with socks and/or shoes not present):       Sensory-Pinprick/Light touch:          Left medial foot (L-4): diminished          Left dorsal foot (L-5): normal          Left lateral foot (S-1): normal          Right medial foot (L-4): diminished  Sensory-Monofilament:          Left foot: normal       Inspection:          Left foot: abnormal             Comments: calluses and dry skin          Right foot: abnormal             Comments: calluses and dry skin       Nails:          Left foot: thickened          Right foot: thickened  1. DIABETES MELLITUS, TYPE II    Deteriorated. >15 min spent with face to face with patient counseling and coordinating care. With his daughter present, he agrees to take his medication as she lays out for him and to check his CBGs twice daily. Recheck a1c in 3 months.

## 2010-07-10 ENCOUNTER — Telehealth: Payer: Self-pay | Admitting: *Deleted

## 2010-07-10 NOTE — Telephone Encounter (Signed)
Patient is requesting an order for Diabetic shoes and inserts. He would like this faxed to [facility]. The fax number is [phone].

## 2010-07-11 NOTE — Telephone Encounter (Signed)
Order faxed to [facility] at [phone].

## 2010-07-11 NOTE — Telephone Encounter (Signed)
Rx in my box.

## 2010-07-31 ENCOUNTER — Observation Stay: Payer: Self-pay | Admitting: Internal Medicine

## 2010-08-01 ENCOUNTER — Encounter (INDEPENDENT_AMBULATORY_CARE_PROVIDER_SITE_OTHER): Payer: Medicare Other | Admitting: *Deleted

## 2010-08-01 DIAGNOSIS — R002 Palpitations: Secondary | ICD-10-CM

## 2010-08-02 DIAGNOSIS — R55 Syncope and collapse: Secondary | ICD-10-CM

## 2010-08-04 ENCOUNTER — Ambulatory Visit: Payer: Medicare Other | Admitting: Cardiovascular Disease

## 2010-08-07 ENCOUNTER — Telehealth: Payer: Self-pay | Admitting: *Deleted

## 2010-08-07 NOTE — Telephone Encounter (Signed)
No it's ok to wait until WEds.  I'm sorry to hear he was not feeling well.

## 2010-08-07 NOTE — Telephone Encounter (Signed)
Left message on cell phone voicemail for [name] to return call.

## 2010-08-07 NOTE — Telephone Encounter (Signed)
Patient went to the podiatrist on 07-31-10 and while he was there he started to feel dizzy and the nurse there checked his blood sugar and it was fine, his heart was low, so they called EMS. He was admitted to [facility] that day and they kept him for  A few days and did some test and they couldn't find anything wrong with him. While he was there they took him off of the metformin because they said that could be what caused his symptoms and sent him home. Duaghter says that since then his sugar has been running around the 200's. He has an appt scheduled for wed, but daughter is asking if he needs to do anything until then.

## 2010-08-08 NOTE — Telephone Encounter (Signed)
Advised Damon [name] as instructed via telephone.  She stated that she will bring in BS readings when she comes in with Mr. Gras for his appt on Wednesday.

## 2010-08-09 ENCOUNTER — Ambulatory Visit (INDEPENDENT_AMBULATORY_CARE_PROVIDER_SITE_OTHER): Payer: Medicare Other | Admitting: Family Medicine

## 2010-08-09 ENCOUNTER — Encounter: Payer: Self-pay | Admitting: Family Medicine

## 2010-08-09 VITALS — BP 112/60 | HR 53 | Temp 97.9°F | Wt 192.5 lb

## 2010-08-09 DIAGNOSIS — R42 Dizziness and giddiness: Secondary | ICD-10-CM

## 2010-08-09 DIAGNOSIS — E119 Type 2 diabetes mellitus without complications: Secondary | ICD-10-CM

## 2010-08-09 DIAGNOSIS — N289 Disorder of kidney and ureter, unspecified: Secondary | ICD-10-CM | POA: Insufficient documentation

## 2010-08-09 LAB — BASIC METABOLIC PANEL
BUN: 30 mg/dL — ABNORMAL HIGH (ref 6–23)
CO2: 27 mEq/L (ref 19–32)
Calcium: 10.1 mg/dL (ref 8.4–10.5)
Chloride: 107 mEq/L (ref 96–112)
Creatinine, Ser: 1.4 mg/dL (ref 0.4–1.5)
GFR: 52.48 mL/min — ABNORMAL LOW (ref >60.00)
Glucose, Bld: 250 mg/dL — ABNORMAL HIGH (ref 70–99)
Potassium: 4.3 mEq/L (ref 3.5–5.1)
Sodium: 141 mEq/L (ref 135–145)

## 2010-08-09 MED ORDER — INSULIN NPH ISOPHANE & REGULAR (70-30) 100 UNIT/ML ~~LOC~~ SUSP: SUBCUTANEOUS | Status: DC

## 2010-08-09 MED ORDER — LISINOPRIL-HYDROCHLOROTHIAZIDE 10-12.5 MG PO TABS: 1.0000 | ORAL_TABLET | Freq: Every day | ORAL | Status: DC

## 2010-08-09 NOTE — Progress Notes (Signed)
77 yo here for [facility] follow up.  Notes reviewed. Admitted 07/31/2010- 08/01/2010 for near syncope. Was having callus removed, felt very light headed and sweaty. CBG was normal per pt on the scene, bradycardic into the 30s and taken to ER.  a1c was 7.8, CE neg x 3 , CXR no acute changes. Carotid dopplers unremarkable- slight plaque bilaterally.  Pt advised to stop taking Metformin due to elevated Cr and dizziness and to follow up with me.   Holter monitor set up for this week with Dr. Rockey Situ.  a1c improved since last month, was 8.1.  We had been titrating up his medications and working on diet.   Pt has had no recurrent dizzy spells but does bring in his CBG log which shows marked hyperglycemia- fasting CBGs at high as 180. Lab Results  Component Value Date   HGBA1C 8.1* 06/29/2010    Currently taking Relion 70/30- 25 units before breakfast and 22 units before evening meal.    Denies any episodes of hypoglycemia.  Chronic renal insufficiency- Cr was 1.5 at discharge, stable.   Lab Results  Component Value Date   CREATININE 1.5 03/28/2010    Review of Systems       See HPI General:  Denies malaise. Eyes:  Denies blurring. ENT:  Denies difficulty swallowing. CV:  Denies chest pain or discomfort. Resp:  Denies shortness of breath. GI:  Denies abdominal pain and change in bowel habits. GU:  Denies incontinence, nocturia, and urinary frequency. MS:  Denies joint pain, joint redness, and joint swelling. Derm:  Denies rash. Neuro:  Denies headaches. Psych:  Denies anxiety and depression. Endo:  Denies cold intolerance and heat intolerance. Heme:  Denies abnormal bruising and bleeding.  Physical Exam BP 112/60  Pulse 53  Temp(Src) 97.9 F (36.6 C) (Oral)  Wt 192 lb 8 oz (87.317 kg)  General:  alert, NAD Afebrile, normotensive. Mouth:  pharynx pink and moist, no erythema, and no exudates.  edentulous.   Lungs:  normal respiratory effort, no crackles, and no wheezes.   Heart:    regular rhythm, no murmur, and no gallop, bradycardia.   Extremities:  No clubbing, cyanosis, edema, or deformity noted with normal full range of motion of all joints.    Diabetes Management Exam:     Foot Exam (with socks and/or shoes not present):       Sensory-Pinprick/Light touch:          Left medial foot (L-4): diminished          Left dorsal foot (L-5): normal          Left lateral foot (S-1): normal          Right medial foot (L-4): diminished       Sensory-Monofilament:          Left foot: normal       Inspection:          Left foot: abnormal             Comments: calluses and dry skin          Right foot: abnormal             Comments: calluses and dry skin       Nails:          Left foot: thickened          Right foot: thickened  1. Renal insufficiency  Basic Metabolic Panel (BMET)   Deteriorated. >15 min spent with face to face with  patient counseling and coordinating care. With his daughter present, he agrees to take his medication as she lays out for him and to check his CBGs twice daily. Recheck a1c in 3 months.   2. Dizziness    Resolved. Agree with holter monitor.  Not taking any rate blocking agents. I did decrease his HCTZ/lisinopril dosing today. Recheck BMET today.  3. DIABETES MELLITUS, TYPE II     Increased dose of Insulin today. Pt keeps a good CBG log, he will call me at end of the week with his CBGs.

## 2010-08-09 NOTE — Patient Instructions (Addendum)
Let's increase your Insulin to 28 units in the morning, 25 units in the evening.  I have cut the dose of your blood pressure medicine- hydrochlorothiazide-lisinopril in half and sent new prescription to your pharmacy. Ok to cut the tablets you have at home in half until you run out.

## 2010-08-11 ENCOUNTER — Encounter: Payer: Self-pay | Admitting: Cardiovascular Disease

## 2010-08-11 ENCOUNTER — Ambulatory Visit (INDEPENDENT_AMBULATORY_CARE_PROVIDER_SITE_OTHER): Payer: Medicare Other | Admitting: Cardiovascular Disease

## 2010-08-11 VITALS — BP 135/76 | HR 49 | Ht 66.0 in | Wt 192.0 lb

## 2010-08-11 DIAGNOSIS — E119 Type 2 diabetes mellitus without complications: Secondary | ICD-10-CM

## 2010-08-11 DIAGNOSIS — I498 Other specified cardiac arrhythmias: Secondary | ICD-10-CM

## 2010-08-11 DIAGNOSIS — R001 Bradycardia, unspecified: Secondary | ICD-10-CM | POA: Insufficient documentation

## 2010-08-11 DIAGNOSIS — N289 Disorder of kidney and ureter, unspecified: Secondary | ICD-10-CM

## 2010-08-11 DIAGNOSIS — R55 Syncope and collapse: Secondary | ICD-10-CM

## 2010-08-11 DIAGNOSIS — I10 Essential (primary) hypertension: Secondary | ICD-10-CM

## 2010-08-11 NOTE — Assessment & Plan Note (Signed)
Blood pressure is well controlled on today's visit. No changes made to the medications. 

## 2010-08-11 NOTE — Patient Instructions (Signed)
You are doing well. No medication changes were made. Please call us if you have new issues that need to be addressed before your next appt.  We will call you for a follow up Appt. In 6 months  

## 2010-08-11 NOTE — Assessment & Plan Note (Addendum)
He appears to have asymptomatic bradycardia. We will followup on his Holter monitor to ensure that he did not have any significant pauses. As he is asymptomatic, no further workup at this time. We have asked him to call our office if he has any lightheadedness or dizziness. We will have him follow up periodically for evaluation of symptoms of nearsyncope. If he becomes symptomatic from his bradycardia, he may require a pacemaker.

## 2010-08-11 NOTE — Assessment & Plan Note (Addendum)
We have encouraged continued exercise, careful diet management in an effort to lose weight. Metformin currently on hold.

## 2010-08-11 NOTE — Progress Notes (Signed)
Patient ID: CACHE JACOBY, male    DOB: April 18, 1933, 77 y.o.   MRN: [id number]  HPI Comments: Mr. Awwad is a pleasant 77 year old gentleman with a history of diabetes and hypertension who presents to our clinic for followup after a hospital admission for bradycardia.  He reports that he was having calluses when he became sweaty and lightheaded. He reported significant pain with the work being done to his feet. EMTs were called and he was noted to have a heart rate in the 30s. He was transferred to the emergency room, admitted overnight and noted to have bradycardia that was persistent with heart rates in the 40s to 50s. He was asymptomatic. He was discharged after further workup and had an outpatient Holter monitor. Final report of the Holter is pending.  He presents todayand reports that he has been feeling well with no problems. He is active at baseline, does mowing, weedeating and chops wood in the wintertime. He does not smoke though he does dip. He denies any other recent symptoms of shortness of breath, edema, lightheadedness or dizziness.  EKG shows sinus bradycardia with rate 46 beats per minute with no significant ST or T wave changes, first degree AV block   Outpatient Encounter Prescriptions as of 08/11/2010  Medication Sig Dispense Refill  . ALPRAZolam (XANAX) 0.25 MG tablet Take 0.25 mg by mouth 3 (three) times daily as needed.        Marland Kitchen aspirin 81 MG tablet Take 81 mg by mouth daily.        . B-D ULTRA-FINE 33 LANCETS MISC Test blood sugar three times daily       . gemfibrozil (LOPID) 600 MG tablet Take 600 mg by mouth 2 (two) times daily before a meal.        . glucose blood test strip (OneTouch Ultra) test strips-test blood sugar three times daily       . insulin NPH-insulin regular (RELION 70/30) (70-30) 100 UNIT/ML injection Inject 28 units subcutaneously before breakfast and 25 units before the evening meal.  10 mL  12  . INSULIN SYRINGE .5CC/29G (RELION INSULIN SYR 0.5CC/29G) 29G  X 1/2" 0.5 ML MISC Inject insulin two times a day as directed       . lisinopril-hydrochlorothiazide (ZESTORETIC) 10-12.5 MG per tablet Take 1 tablet by mouth daily.  30 tablet  11  . metFORMIN (GLUCOPHAGE) 1000 MG tablet Take 1,000 mg by mouth 2 (two) times daily with a meal. On hold by Dr. Deborra Medina         Review of Systems  Constitutional: Negative.   HENT: Negative.   Eyes: Negative.   Respiratory: Negative.   Cardiovascular: Negative.   Gastrointestinal: Negative.   Musculoskeletal: Negative.   Skin: Negative.   Neurological: Negative.   Hematological: Negative.   Psychiatric/Behavioral: Negative.   All other systems reviewed and are negative.     BP 135/76  Pulse 49  Ht 5\' 6"  (1.676 m)  Wt 192 lb (87.091 kg)  BMI 30.99 kg/m2  Physical Exam  Nursing note and vitals reviewed. Constitutional: He is oriented to person, place, and time. He appears well-developed and well-nourished.       obese  HENT:  Head: Normocephalic.  Nose: Nose normal.  Mouth/Throat: Oropharynx is clear and moist.  Eyes: Conjunctivae are normal. Pupils are equal, round, and reactive to light.  Neck: Normal range of motion. Neck supple. No JVD present.  Cardiovascular: Normal rate, regular rhythm, S1 normal, S2 normal, normal heart sounds and  intact distal pulses.  Exam reveals no gallop and no friction rub.   No murmur heard. Pulmonary/Chest: Effort normal and breath sounds normal. No respiratory distress. He has no wheezes. He has no rales. He exhibits no tenderness.  Abdominal: Soft. Bowel sounds are normal. He exhibits no distension. There is no tenderness.  Musculoskeletal: Normal range of motion. He exhibits no edema and no tenderness.  Lymphadenopathy:    He has no cervical adenopathy.  Neurological: He is alert and oriented to person, place, and time. Coordination normal.  Skin: Skin is warm and dry. No rash noted. No erythema.  Psychiatric: He has a normal mood and affect. His behavior is  normal. Judgment and thought content normal.           Assessment and Plan

## 2010-08-13 ENCOUNTER — Encounter: Payer: Self-pay | Admitting: Cardiovascular Disease

## 2010-08-13 NOTE — Assessment & Plan Note (Signed)
Appears to be at his baseline, creatinine 1.4 to 1.5. Suspect renal dysfunction is from underlying diabetes.

## 2010-08-15 ENCOUNTER — Ambulatory Visit: Payer: Medicare Other | Admitting: Family Medicine

## 2010-08-22 ENCOUNTER — Telehealth: Payer: Self-pay | Admitting: *Deleted

## 2010-08-22 NOTE — Telephone Encounter (Signed)
Pt's daughter [name] called regarding results and LMOM.  I called her back and left msg with results below on vm. She is to call back if he wants to schedule an appt or with any other questions or concerns.

## 2010-08-22 NOTE — Telephone Encounter (Signed)
Attempted to call pt to give him his holter monitor results per Dr. Rockey Situ. Shows long periods of sinus bradycardia (no symptoms). Average HR 50s-60s. Min HR 40s. Rare short SVT (longest 6 beats). Attempted to contact pt, LMOM TCB.

## 2010-08-31 ENCOUNTER — Other Ambulatory Visit: Payer: Self-pay | Admitting: Cardiovascular Disease

## 2010-08-31 DIAGNOSIS — R002 Palpitations: Secondary | ICD-10-CM

## 2010-10-02 ENCOUNTER — Other Ambulatory Visit: Payer: Self-pay | Admitting: Family Medicine

## 2010-10-02 DIAGNOSIS — I10 Essential (primary) hypertension: Secondary | ICD-10-CM

## 2010-10-02 DIAGNOSIS — E119 Type 2 diabetes mellitus without complications: Secondary | ICD-10-CM

## 2010-10-03 ENCOUNTER — Other Ambulatory Visit (INDEPENDENT_AMBULATORY_CARE_PROVIDER_SITE_OTHER): Payer: Medicare Other

## 2010-10-03 DIAGNOSIS — I10 Essential (primary) hypertension: Secondary | ICD-10-CM

## 2010-10-03 DIAGNOSIS — E119 Type 2 diabetes mellitus without complications: Secondary | ICD-10-CM

## 2010-10-03 LAB — HEMOGLOBIN A1C: Hgb A1c MFr Bld: 9.7 % — ABNORMAL HIGH (ref 4.6–6.5)

## 2010-10-04 ENCOUNTER — Other Ambulatory Visit: Payer: Self-pay | Admitting: Family Medicine

## 2010-10-04 ENCOUNTER — Telehealth: Payer: Self-pay | Admitting: Family Medicine

## 2010-10-04 DIAGNOSIS — E119 Type 2 diabetes mellitus without complications: Secondary | ICD-10-CM

## 2010-10-04 LAB — BASIC METABOLIC PANEL
BUN: 28 mg/dL — ABNORMAL HIGH (ref 6–23)
CO2: 24 mEq/L (ref 19–32)
Calcium: 10.6 mg/dL — ABNORMAL HIGH (ref 8.4–10.5)
Chloride: 102 mEq/L (ref 96–112)
Creatinine, Ser: 1.5 mg/dL (ref 0.4–1.5)
GFR: 49.2 mL/min — ABNORMAL LOW (ref >60.00)
Glucose, Bld: 259 mg/dL — ABNORMAL HIGH (ref 70–99)
Potassium: 4.7 mEq/L (ref 3.5–5.1)
Sodium: 141 mEq/L (ref 135–145)

## 2010-10-04 NOTE — Telephone Encounter (Signed)
Please call [name] back re: setting up appt per request

## 2010-10-05 NOTE — Telephone Encounter (Signed)
Spoke with Damon [name] and she will make a f/u appt for Damon [name] in 3 months per Dr. Hulen Shouts request.  Damon [name] has already spoken with Damon [name] regarding Endo appt for [name].

## 2010-10-17 ENCOUNTER — Ambulatory Visit (INDEPENDENT_AMBULATORY_CARE_PROVIDER_SITE_OTHER): Payer: Medicare Other

## 2010-10-17 DIAGNOSIS — Z23 Encounter for immunization: Secondary | ICD-10-CM

## 2010-11-06 ENCOUNTER — Encounter (HOSPITAL_BASED_OUTPATIENT_CLINIC_OR_DEPARTMENT_OTHER): Payer: Medicare Other

## 2010-12-11 ENCOUNTER — Encounter (HOSPITAL_BASED_OUTPATIENT_CLINIC_OR_DEPARTMENT_OTHER): Payer: Medicare Other

## 2011-01-02 ENCOUNTER — Ambulatory Visit (INDEPENDENT_AMBULATORY_CARE_PROVIDER_SITE_OTHER): Payer: Medicare Other | Admitting: Family Medicine

## 2011-01-02 ENCOUNTER — Encounter: Payer: Self-pay | Admitting: Family Medicine

## 2011-01-02 VITALS — BP 102/60 | HR 62 | Temp 98.2°F | Ht 65.0 in | Wt 206.5 lb

## 2011-01-02 DIAGNOSIS — N289 Disorder of kidney and ureter, unspecified: Secondary | ICD-10-CM

## 2011-01-02 DIAGNOSIS — I10 Essential (primary) hypertension: Secondary | ICD-10-CM

## 2011-01-02 DIAGNOSIS — E1149 Type 2 diabetes mellitus with other diabetic neurological complication: Secondary | ICD-10-CM

## 2011-01-02 LAB — BASIC METABOLIC PANEL
BUN: 30 mg/dL — ABNORMAL HIGH (ref 6–23)
CO2: 25 mEq/L (ref 19–32)
Calcium: 10 mg/dL (ref 8.4–10.5)
Chloride: 101 mEq/L (ref 96–112)
Creatinine, Ser: 1.7 mg/dL — ABNORMAL HIGH (ref 0.4–1.5)
GFR: 40.79 mL/min — ABNORMAL LOW (ref >60.00)
Glucose, Bld: 403 mg/dL — ABNORMAL HIGH (ref 70–99)
Potassium: 4.7 mEq/L (ref 3.5–5.1)
Sodium: 134 mEq/L — ABNORMAL LOW (ref 135–145)

## 2011-01-02 LAB — HEMOGLOBIN A1C: Hgb A1c MFr Bld: 9.2 % — ABNORMAL HIGH (ref 4.6–6.5)

## 2011-01-02 MED ORDER — BD LANCET ULTRAFINE 33G MISC: Status: DC

## 2011-01-02 MED ORDER — GEMFIBROZIL 600 MG PO TABS: 600.0000 mg | ORAL_TABLET | Freq: Two times a day (BID) | ORAL | Status: DC

## 2011-01-02 MED ORDER — INSULIN NPH ISOPHANE & REGULAR (70-30) 100 UNIT/ML ~~LOC~~ SUSP: SUBCUTANEOUS | Status: DC

## 2011-01-02 MED ORDER — "INSULIN SYRINGE 29G X 1/2"" 0.5 ML MISC": Status: DC

## 2011-01-02 MED ORDER — LISINOPRIL-HYDROCHLOROTHIAZIDE 10-12.5 MG PO TABS: 1.0000 | ORAL_TABLET | Freq: Every day | ORAL | Status: DC

## 2011-01-02 MED ORDER — GLUCOSE BLOOD VI STRP: ORAL_STRIP | Status: DC

## 2011-01-02 NOTE — Progress Notes (Signed)
77 yo here for follow up HTN, DM.  DM- Lab Results  Component Value Date   HGBA1C 9.7* 10/03/2010   Referred to [name] in October to help regulate blood sugar. Per pt, cbgs much better- 90-200. Denies any episodes of hypoglycemia. Working on diet, although still goes to [facility] almost daily.  No recurrent foot ulcers- going to podiatrist regularly. On  Relion 70/30- 30 units before breakfast and 40units before evening meal.   CBG this am was 120. Denies any episodes of hypoglycemia.  HTN- BP stalbe on Lisinopril-HCTZ 20-25 mg daily.  No CP, blurred vision, HA or SOB.  No LE edema.   Review of Systems       See HPI General:  Denies malaise. Eyes:  Denies blurring. ENT:  Denies difficulty swallowing. CV:  Denies chest pain or discomfort. Resp:  Denies shortness of breath. GI:  Denies abdominal pain and change in bowel habits. GU:  Denies incontinence, nocturia, and urinary frequency. MS:  Denies joint pain, joint redness, and joint swelling. Derm:  Denies rash. Neuro:  Denies headaches. Psych:  Denies anxiety and depression. Endo:  Denies cold intolerance and heat intolerance. Heme:  Denies abnormal bruising and bleeding.  Physical Exam BP 102/60  Pulse 62  Temp(Src) 98.2 F (36.8 C) (Oral)  Ht 5\' 5"  (1.651 m)  Wt 206 lb 8 oz (93.668 kg)  BMI 34.36 kg/m2  General:  alert, NAD Afebrile, normotensive. Mouth:  pharynx pink and moist, no erythema, and no exudates.  edentulous.   Lungs:  normal respiratory effort, no crackles, and no wheezes.   Heart:   regular rhythm, no murmur, and no gallop.   Extremities:  No clubbing, cyanosis, edema, or deformity noted with normal full range of motion of all joints.    Diabetes Management Exam:     Foot Exam (with socks and/or shoes not present):       Sensory-Pinprick/Light touch:          Left medial foot (L-4): diminished          Left dorsal foot (L-5): normal          Left lateral foot (S-1): normal          Right  medial foot (L-4): diminished       Sensory-Monofilament:          Left foot: normal       Inspection:          Left foot: abnormal             Comments: calluses and dry skin          Right foot: abnormal             Comments: calluses and dry skin       Nails:          Left foot: thickened          Right foot: thickened  1.  DM- Improved per pt now that he is followed by endocrinology. She has not yet repeated his a1c. Will recheck today.  2.  HTN- Stable.  Continue current meds. Check BMET today.

## 2011-01-02 NOTE — Patient Instructions (Signed)
Good to see you. We will call with the results of you a1c and fax a copy to [name].

## 2011-01-02 NOTE — Progress Notes (Signed)
Addended by: Harmon Dun on: 01/02/2011 08:55 AM   Modules accepted: Orders

## 2011-01-05 ENCOUNTER — Other Ambulatory Visit: Payer: Self-pay | Admitting: Family Medicine

## 2011-01-05 DIAGNOSIS — E119 Type 2 diabetes mellitus without complications: Secondary | ICD-10-CM

## 2011-01-05 DIAGNOSIS — N289 Disorder of kidney and ureter, unspecified: Secondary | ICD-10-CM

## 2011-02-21 ENCOUNTER — Other Ambulatory Visit: Payer: Self-pay | Admitting: Family Medicine

## 2011-02-21 NOTE — Telephone Encounter (Signed)
Ok to refill 

## 2011-02-21 NOTE — Telephone Encounter (Signed)
Pts daughter called, need refills sent to [facility]- [phone]  Gemfibrozil 600mg  tab- 1 daily The mail order will not refill until 03-03-2011, says pt will be out before than- daughter says pt lost the last refill. cdavis  Called daugther after 3:00pm at [phone]

## 2011-02-22 MED ORDER — GEMFIBROZIL 600 MG PO TABS: 600.0000 mg | ORAL_TABLET | Freq: Two times a day (BID) | ORAL | Status: DC

## 2011-02-22 NOTE — Telephone Encounter (Signed)
Spoke with patients daugther, Rx sent to [facility].

## 2011-05-03 ENCOUNTER — Ambulatory Visit (INDEPENDENT_AMBULATORY_CARE_PROVIDER_SITE_OTHER): Payer: Medicare Other | Admitting: Family Medicine

## 2011-05-03 ENCOUNTER — Encounter: Payer: Self-pay | Admitting: Family Medicine

## 2011-05-03 VITALS — BP 120/80 | HR 68 | Temp 97.9°F | Wt 212.0 lb

## 2011-05-03 DIAGNOSIS — M549 Dorsalgia, unspecified: Secondary | ICD-10-CM

## 2011-05-03 MED ORDER — CYCLOBENZAPRINE HCL 5 MG PO TABS: 5.0000 mg | ORAL_TABLET | Freq: Three times a day (TID) | ORAL | Status: AC | PRN

## 2011-05-03 NOTE — Patient Instructions (Signed)
Apply heating pad when you can. Keep walking as much as possible. Use muscle relaxant as directed (no heavy machinery- remote control is ok).

## 2011-05-03 NOTE — Progress Notes (Signed)
SUBJECTIVE:  Damon [name] is a 78 y.o. male who complains of an injury causing low back pain 2 day(s) ago. The pain is positional with bending or lifting, without radiation down the legs. Mechanism of injury: yardwork. Symptoms have been intermittent since that time. Prior history of back problems: recurrent self limited episodes of low back pain in the past. There is no numbness in the legs.  Patient Active Problem List  Diagnoses  . DIABETES MELLITUS, TYPE II  . DIABETIC PERIPHERAL NEUROPATHY  . DIABETIC FOOT ULCER, LEFT  . HYPERTRIGLYCERIDEMIA  . OBESITY  . GRIEF REACTION, ACUTE  . HYPERTENSION  . SKIN TAG  . Renal insufficiency  . Dizziness  . Bradycardia   Past Medical History  Diagnosis Date  . Bradycardia   . Diabetes mellitus   . Hypertension   . Chronic kidney disease     BAseline creatinine 1.4 to 1.5  . Arrhythmia     Baseline bradycardia   No past surgical history on file. History  Substance Use Topics  . Smoking status: Former Smoker -- 1.0 packs/day for 2 years    Types: Cigars    Quit date: 01/22/1990  . Smokeless tobacco: Current User    Types: Chew   Comment: chewing more than 40 years/chew 1PPD  . Alcohol Use: Not on file   Family History  Problem Relation Age of Onset  . Diabetes type II Mother   . Hypertension Mother   . Diabetes type II Father   . Hypertension Father    Allergies  Allergen Reactions  . Penicillins    Current Outpatient Prescriptions on File Prior to Visit  Medication Sig Dispense Refill  . aspirin 81 MG tablet Take 81 mg by mouth daily.        . B-D ULTRA-FINE 33 LANCETS MISC Test blood sugar three times daily  100 each  12  . gemfibrozil (LOPID) 600 MG tablet Take 1 tablet (600 mg total) by mouth 2 (two) times daily before a meal.  60 tablet  0  . glucose blood test strip (OneTouch Ultra) test strips-test blood sugar three times daily  100 each  12  . insulin NPH-insulin regular (RELION 70/30) (70-30) 100 UNIT/ML injection  Inject 30 units subcutaneously before breakfast and 40 units before the evening meal.  10 mL  12  . INSULIN SYRINGE .5CC/29G (RELION INSULIN SYR 0.5CC/29G) 29G X 1/2" 0.5 ML MISC Inject insulin two times a day as directed  200 each  12  . lisinopril-hydrochlorothiazide (ZESTORETIC) 10-12.5 MG per tablet Take 1 tablet by mouth daily.  90 tablet  3   The PMH, PSH, Social History, Family History, Medications, and allergies have been reviewed in [facility], and have been updated if relevant.  OBJECTIVE: BP 120/80  Pulse 68  Temp(Src) 97.9 F (36.6 C) (Oral)  Wt 212 lb (96.163 kg)  Vital signs as noted above. Patient appears to be in mild to moderate pain, antalgic gait noted. Lumbosacral spine area reveals no local tenderness or mass. Painful and reduced LS ROM noted. Straight leg raise is negative bilaterally, DTR's, motor strength and sensation normal, including heel and toe gait.  Peripheral pulses are palpable. Lumbar spine X-Ray: not indicated.   ASSESSMENT:  lumbar strain  PLAN: For acute pain, rest, intermittent application of cold packs (later, may switch to heat, but do not sleep on heating pad), analgesics and muscle relaxants are recommended. Discussed longer term treatment plan of prn NSAID's and discussed a home back care exercise  program with flexion exercise routine. Proper lifting with avoidance of heavy lifting discussed. Consider Physical Therapy and XRay studies if not improving. Call or return to clinic prn if these symptoms worsen or fail to improve as anticipated.

## 2011-05-10 ENCOUNTER — Emergency Department: Payer: Self-pay | Admitting: Emergency Medicine

## 2011-05-10 LAB — COMPREHENSIVE METABOLIC PANEL
Albumin: 3.8 g/dL (ref 3.4–5.0)
Alkaline Phosphatase: 117 U/L (ref 50–136)
Anion Gap: 10 (ref 7–16)
BUN: 30 mg/dL — ABNORMAL HIGH (ref 7–18)
Bilirubin,Total: 0.6 mg/dL (ref 0.2–1.0)
Calcium, Total: 10 mg/dL (ref 8.5–10.1)
Chloride: 107 mmol/L (ref 98–107)
Co2: 25 mmol/L (ref 21–32)
Creatinine: 1.49 mg/dL — ABNORMAL HIGH (ref 0.60–1.30)
EGFR (African American): 52 — ABNORMAL LOW
EGFR (Non-African Amer.): 45 — ABNORMAL LOW
Glucose: 53 mg/dL — ABNORMAL LOW (ref 65–99)
Osmolality: 287 (ref 275–301)
Potassium: 4 mmol/L (ref 3.5–5.1)
SGOT(AST): 29 U/L (ref 15–37)
SGPT (ALT): 29 U/L
Sodium: 142 mmol/L (ref 136–145)
Total Protein: 7.3 g/dL (ref 6.4–8.2)

## 2011-05-10 LAB — CBC
HCT: 51.4 % (ref 40.0–52.0)
HGB: 16.6 g/dL (ref 13.0–18.0)
MCH: 28 pg (ref 26.0–34.0)
MCHC: 32.3 g/dL (ref 32.0–36.0)
MCV: 87 fL (ref 80–100)
Platelet: 179 10*3/uL (ref 150–440)
RBC: 5.91 10*6/uL — ABNORMAL HIGH (ref 4.40–5.90)
RDW: 15.1 % — ABNORMAL HIGH (ref 11.5–14.5)
WBC: 9 10*3/uL (ref 3.8–10.6)

## 2011-05-10 LAB — TROPONIN I: Troponin-I: <0.02 ng/mL

## 2011-06-02 ENCOUNTER — Emergency Department: Payer: Self-pay | Admitting: Emergency Medicine

## 2011-08-17 ENCOUNTER — Ambulatory Visit: Payer: Self-pay

## 2011-10-16 ENCOUNTER — Ambulatory Visit (INDEPENDENT_AMBULATORY_CARE_PROVIDER_SITE_OTHER): Payer: Medicare Other

## 2011-10-16 DIAGNOSIS — Z23 Encounter for immunization: Secondary | ICD-10-CM

## 2011-12-09 ENCOUNTER — Other Ambulatory Visit: Payer: Self-pay | Admitting: Family Medicine

## 2011-12-10 ENCOUNTER — Telehealth: Payer: Self-pay

## 2011-12-10 NOTE — Telephone Encounter (Signed)
pts daughter said endocrinologist changed insulin dosage and kidney dr changed to plain lisinopril. Scheduled appt 12/13/11 with Dr Deborra Medina to review and refill meds.

## 2011-12-13 ENCOUNTER — Encounter: Payer: Self-pay | Admitting: Family Medicine

## 2011-12-13 ENCOUNTER — Ambulatory Visit (INDEPENDENT_AMBULATORY_CARE_PROVIDER_SITE_OTHER): Payer: Medicare Other | Admitting: Family Medicine

## 2011-12-13 VITALS — BP 120/60 | HR 60 | Temp 97.5°F | Wt 212.0 lb

## 2011-12-13 DIAGNOSIS — E119 Type 2 diabetes mellitus without complications: Secondary | ICD-10-CM

## 2011-12-13 DIAGNOSIS — I10 Essential (primary) hypertension: Secondary | ICD-10-CM

## 2011-12-13 DIAGNOSIS — N289 Disorder of kidney and ureter, unspecified: Secondary | ICD-10-CM

## 2011-12-13 NOTE — Progress Notes (Signed)
78 yo here for follow up HTN, DM.  DM- Referred to [name] last October to help regulate blood sugar. Dr. Joycie Peek note from 11/21/2011 reviewed.  A1c from 10/2011 8.8, Cr 1.7. Denies any episodes of hypoglycemia. Working on diet, although still goes to [facility] almost daily.   Wt Readings from Last 3 Encounters:  12/13/11 212 lb (96.163 kg)  05/03/11 212 lb (96.163 kg)  01/02/11 206 lb 8 oz (93.668 kg)   He is now on 70/30 mix insulin, 50 units qam and 40 units qhs.  Diabetic nephropathy- with secondary hyperparathyroidism, on sensipar 30 mg twice weekly.  Sees Dr. Candiss Norse.  No recurrent foot ulcers- going to podiatrist regularly. On  Relion 70/30- 30 units before breakfast and 40units before evening meal.   CBG this am was 120. Denies any episodes of hypoglycemia.  HTN- BP stalbe on Lisinopril- 20  mg daily.  No CP, blurred vision, HA or SOB.  No LE edema.  Patient Active Problem List  Diagnosis  . DIABETES MELLITUS, TYPE II  . DIABETIC PERIPHERAL NEUROPATHY  . DIABETIC FOOT ULCER, LEFT  . HYPERTRIGLYCERIDEMIA  . OBESITY  . GRIEF REACTION, ACUTE  . HYPERTENSION  . SKIN TAG  . Renal insufficiency  . Dizziness  . Bradycardia   Past Medical History  Diagnosis Date  . Bradycardia   . Diabetes mellitus   . Hypertension   . Chronic kidney disease     BAseline creatinine 1.4 to 1.5  . Arrhythmia     Baseline bradycardia   No past surgical history on file. History  Substance Use Topics  . Smoking status: Former Smoker -- 1.0 packs/day for 2 years    Types: Cigars    Quit date: 01/22/1990  . Smokeless tobacco: Current User    Types: Chew     Comment: chewing more than 40 years/chew 1PPD  . Alcohol Use: Not on file   Family History  Problem Relation Age of Onset  . Diabetes type II Mother   . Hypertension Mother   . Diabetes type II Father   . Hypertension Father    Allergies  Allergen Reactions  . Penicillins    Current Outpatient Prescriptions on  File Prior to Visit  Medication Sig Dispense Refill  . aspirin 81 MG tablet Take 81 mg by mouth daily.        . B-D ULTRA-FINE 33 LANCETS MISC Test blood sugar three times daily  100 each  12  . gemfibrozil (LOPID) 600 MG tablet Take 1 tablet by mouth  twice a day before a meal  180 tablet  0  . glucose blood test strip (OneTouch Ultra) test strips-test blood sugar three times daily  100 each  12  . insulin NPH-insulin regular (RELION 70/30) (70-30) 100 UNIT/ML injection Inject 30 units subcutaneously before breakfast and 40 units before the evening meal.  10 mL  12  . INSULIN SYRINGE .5CC/29G (RELION INSULIN SYR 0.5CC/29G) 29G X 1/2" 0.5 ML MISC Inject insulin two times a day as directed  200 each  12  . lisinopril-hydrochlorothiazide (ZESTORETIC) 10-12.5 MG per tablet Take 1 tablet by mouth daily.  90 tablet  3   The PMH, PSH, Social History, Family History, Medications, and allergies have been reviewed in [facility], and have been updated if relevant.   Review of Systems       See HPI General:  Denies malaise. Eyes:  Denies blurring. ENT:  Denies difficulty swallowing. CV:  Denies chest pain or discomfort. Resp:  Denies shortness of breath. GI:  Denies abdominal pain and change in bowel habits. GU:  Denies incontinence, nocturia, and urinary frequency. MS:  Denies joint pain, joint redness, and joint swelling. Derm:  Denies rash. Neuro:  Denies headaches. Psych:  Denies anxiety and depression. Endo:  Denies cold intolerance and heat intolerance. Heme:  Denies abnormal bruising and bleeding.  Physical Exam BP 120/60  Pulse 60  Temp 97.5 F (36.4 C)  Wt 212 lb (96.163 kg)  General:  alert, NAD Afebrile, normotensive. Mouth:  pharynx pink and moist, no erythema, and no exudates.  edentulous.   Lungs:  normal respiratory effort, no crackles, and no wheezes.   Heart:   regular rhythm, no murmur, and no gallop.   Extremities:  No clubbing, cyanosis, edema, or deformity noted with  normal full range of motion of all joints.    1.  DM- Remains difficult to control - is followed by endocrinology.  2.  HTN- Stable.  Continue current meds. Meds refilled.

## 2011-12-13 NOTE — Patient Instructions (Addendum)
Good to see you. We will take care of your medications.  Have a wonderful holiday.

## 2011-12-28 ENCOUNTER — Emergency Department: Payer: Self-pay | Admitting: Emergency Medicine

## 2011-12-28 LAB — COMPREHENSIVE METABOLIC PANEL
Albumin: 3.7 g/dL (ref 3.4–5.0)
Alkaline Phosphatase: 190 U/L — ABNORMAL HIGH (ref 50–136)
Anion Gap: 10 (ref 7–16)
BUN: 26 mg/dL — ABNORMAL HIGH (ref 7–18)
Bilirubin,Total: 0.7 mg/dL (ref 0.2–1.0)
Calcium, Total: 9.8 mg/dL (ref 8.5–10.1)
Chloride: 110 mmol/L — ABNORMAL HIGH (ref 98–107)
Co2: 20 mmol/L — ABNORMAL LOW (ref 21–32)
Creatinine: 1.86 mg/dL — ABNORMAL HIGH (ref 0.60–1.30)
EGFR (African American): 40 — ABNORMAL LOW
EGFR (Non-African Amer.): 34 — ABNORMAL LOW
Glucose: 202 mg/dL — ABNORMAL HIGH (ref 65–99)
Osmolality: 290 (ref 275–301)
Potassium: 4.4 mmol/L (ref 3.5–5.1)
SGOT(AST): 22 U/L (ref 15–37)
SGPT (ALT): 20 U/L (ref 12–78)
Sodium: 140 mmol/L (ref 136–145)
Total Protein: 7.5 g/dL (ref 6.4–8.2)

## 2011-12-28 LAB — CBC
HCT: 52 % (ref 40.0–52.0)
HGB: 17 g/dL (ref 13.0–18.0)
MCH: 28.4 pg (ref 26.0–34.0)
MCHC: 32.7 g/dL (ref 32.0–36.0)
MCV: 87 fL (ref 80–100)
Platelet: 175 10*3/uL (ref 150–440)
RBC: 5.97 10*6/uL — ABNORMAL HIGH (ref 4.40–5.90)
RDW: 15.9 % — ABNORMAL HIGH (ref 11.5–14.5)
WBC: 15 10*3/uL — ABNORMAL HIGH (ref 3.8–10.6)

## 2011-12-30 ENCOUNTER — Inpatient Hospital Stay (HOSPITAL_COMMUNITY)
Admission: EM | Admit: 2011-12-30 | Discharge: 2012-01-15 | DRG: 372 | Disposition: A | Discharge status: Skilled Nursing Facility | Payer: Medicare Other | Attending: Internal Medicine | Admitting: Internal Medicine

## 2011-12-30 ENCOUNTER — Encounter (HOSPITAL_COMMUNITY): Payer: Self-pay | Admitting: Family Medicine

## 2011-12-30 DIAGNOSIS — E1142 Type 2 diabetes mellitus with diabetic polyneuropathy: Secondary | ICD-10-CM | POA: Diagnosis present

## 2011-12-30 DIAGNOSIS — R1310 Dysphagia, unspecified: Secondary | ICD-10-CM | POA: Diagnosis not present

## 2011-12-30 DIAGNOSIS — R5381 Other malaise: Secondary | ICD-10-CM | POA: Diagnosis not present

## 2011-12-30 DIAGNOSIS — N289 Disorder of kidney and ureter, unspecified: Secondary | ICD-10-CM

## 2011-12-30 DIAGNOSIS — I129 Hypertensive chronic kidney disease with stage 1 through stage 4 chronic kidney disease, or unspecified chronic kidney disease: Secondary | ICD-10-CM | POA: Diagnosis present

## 2011-12-30 DIAGNOSIS — A498 Other bacterial infections of unspecified site: Secondary | ICD-10-CM

## 2011-12-30 DIAGNOSIS — Z794 Long term (current) use of insulin: Secondary | ICD-10-CM

## 2011-12-30 DIAGNOSIS — E1169 Type 2 diabetes mellitus with other specified complication: Secondary | ICD-10-CM | POA: Diagnosis not present

## 2011-12-30 DIAGNOSIS — R188 Other ascites: Secondary | ICD-10-CM | POA: Diagnosis not present

## 2011-12-30 DIAGNOSIS — A039 Shigellosis, unspecified: Secondary | ICD-10-CM | POA: Diagnosis present

## 2011-12-30 DIAGNOSIS — F0390 Unspecified dementia without behavioral disturbance: Secondary | ICD-10-CM | POA: Diagnosis present

## 2011-12-30 DIAGNOSIS — E872 Acidosis, unspecified: Secondary | ICD-10-CM | POA: Diagnosis not present

## 2011-12-30 DIAGNOSIS — E1149 Type 2 diabetes mellitus with other diabetic neurological complication: Secondary | ICD-10-CM | POA: Diagnosis present

## 2011-12-30 DIAGNOSIS — A030 Shigellosis due to Shigella dysenteriae: Principal | ICD-10-CM | POA: Diagnosis present

## 2011-12-30 DIAGNOSIS — I10 Essential (primary) hypertension: Secondary | ICD-10-CM | POA: Diagnosis present

## 2011-12-30 DIAGNOSIS — Z79899 Other long term (current) drug therapy: Secondary | ICD-10-CM

## 2011-12-30 DIAGNOSIS — N189 Chronic kidney disease, unspecified: Secondary | ICD-10-CM

## 2011-12-30 DIAGNOSIS — E1129 Type 2 diabetes mellitus with other diabetic kidney complication: Secondary | ICD-10-CM | POA: Diagnosis present

## 2011-12-30 DIAGNOSIS — E781 Pure hyperglyceridemia: Secondary | ICD-10-CM | POA: Diagnosis present

## 2011-12-30 DIAGNOSIS — R001 Bradycardia, unspecified: Secondary | ICD-10-CM | POA: Diagnosis present

## 2011-12-30 DIAGNOSIS — Z66 Do not resuscitate: Secondary | ICD-10-CM | POA: Diagnosis present

## 2011-12-30 DIAGNOSIS — N179 Acute kidney failure, unspecified: Secondary | ICD-10-CM

## 2011-12-30 DIAGNOSIS — E870 Hyperosmolality and hypernatremia: Secondary | ICD-10-CM | POA: Diagnosis not present

## 2011-12-30 DIAGNOSIS — K921 Melena: Secondary | ICD-10-CM

## 2011-12-30 DIAGNOSIS — E669 Obesity, unspecified: Secondary | ICD-10-CM | POA: Diagnosis present

## 2011-12-30 DIAGNOSIS — I498 Other specified cardiac arrhythmias: Secondary | ICD-10-CM | POA: Diagnosis present

## 2011-12-30 DIAGNOSIS — R410 Disorientation, unspecified: Secondary | ICD-10-CM

## 2011-12-30 DIAGNOSIS — F4489 Other dissociative and conversion disorders: Secondary | ICD-10-CM

## 2011-12-30 DIAGNOSIS — E1121 Type 2 diabetes mellitus with diabetic nephropathy: Secondary | ICD-10-CM | POA: Diagnosis present

## 2011-12-30 DIAGNOSIS — E860 Dehydration: Secondary | ICD-10-CM

## 2011-12-30 DIAGNOSIS — Z7982 Long term (current) use of aspirin: Secondary | ICD-10-CM

## 2011-12-30 HISTORY — DX: Unspecified hearing loss, unspecified ear: H91.90

## 2011-12-30 LAB — URINALYSIS, MICROSCOPIC ONLY
Bilirubin Urine: NEGATIVE
Glucose, UA: NEGATIVE mg/dL
Hgb urine dipstick: NEGATIVE
Ketones, ur: NEGATIVE mg/dL
Leukocytes, UA: NEGATIVE
Nitrite: NEGATIVE
Protein, ur: NEGATIVE mg/dL
Specific Gravity, Urine: 1.018 (ref 1.005–1.030)
Urobilinogen, UA: 0.2 mg/dL (ref 0.0–1.0)
pH: 5 (ref 5.0–8.0)

## 2011-12-30 LAB — COMPREHENSIVE METABOLIC PANEL
ALT: 11 U/L (ref 0–53)
AST: 21 U/L (ref 0–37)
Albumin: 3.1 g/dL — ABNORMAL LOW (ref 3.5–5.2)
Alkaline Phosphatase: 113 U/L (ref 39–117)
BUN: 44 mg/dL — ABNORMAL HIGH (ref 6–23)
CO2: 17 mEq/L — ABNORMAL LOW (ref 19–32)
Calcium: 10.5 mg/dL (ref 8.4–10.5)
Chloride: 104 mEq/L (ref 96–112)
Creatinine, Ser: 3.29 mg/dL — ABNORMAL HIGH (ref 0.50–1.35)
GFR calc Af Amer: 19 mL/min — ABNORMAL LOW (ref >90)
GFR calc non Af Amer: 17 mL/min — ABNORMAL LOW (ref >90)
Glucose, Bld: 89 mg/dL (ref 70–99)
Potassium: 4.3 mEq/L (ref 3.5–5.1)
Sodium: 138 mEq/L (ref 135–145)
Total Bilirubin: 0.5 mg/dL (ref 0.3–1.2)
Total Protein: 6.7 g/dL (ref 6.0–8.3)

## 2011-12-30 LAB — CBC WITH DIFFERENTIAL/PLATELET
Basophils Absolute: 0 10*3/uL (ref 0.0–0.1)
Basophils Relative: 0 % (ref 0–1)
Eosinophils Absolute: 0.1 10*3/uL (ref 0.0–0.7)
Eosinophils Relative: 0 % (ref 0–5)
HCT: 48.5 % (ref 39.0–52.0)
Hemoglobin: 16.4 g/dL (ref 13.0–17.0)
Lymphocytes Relative: 14 % (ref 12–46)
Lymphs Abs: 2.4 10*3/uL (ref 0.7–4.0)
MCH: 28.6 pg (ref 26.0–34.0)
MCHC: 33.8 g/dL (ref 30.0–36.0)
MCV: 84.6 fL (ref 78.0–100.0)
Monocytes Absolute: 1.9 10*3/uL — ABNORMAL HIGH (ref 0.1–1.0)
Monocytes Relative: 11 % (ref 3–12)
Neutro Abs: 13.2 10*3/uL — ABNORMAL HIGH (ref 1.7–7.7)
Neutrophils Relative %: 75 % (ref 43–77)
Platelets: 161 10*3/uL (ref 150–400)
RBC: 5.73 MIL/uL (ref 4.22–5.81)
RDW: 15.8 % — ABNORMAL HIGH (ref 11.5–15.5)
WBC: 17.6 10*3/uL — ABNORMAL HIGH (ref 4.0–10.5)

## 2011-12-30 LAB — GLUCOSE, CAPILLARY: Glucose-Capillary: 105 mg/dL — ABNORMAL HIGH (ref 70–99)

## 2011-12-30 LAB — LIPASE, BLOOD: Lipase: 11 U/L (ref 11–59)

## 2011-12-30 MED ORDER — ACETAMINOPHEN 325 MG PO TABS: 650.0000 mg | ORAL_TABLET | Freq: Four times a day (QID) | ORAL | Status: DC | PRN

## 2011-12-30 MED ORDER — CINACALCET HCL 30 MG PO TABS
30.0000 mg | ORAL_TABLET | ORAL | Status: DC
  Administered 2012-01-02 – 2012-01-13 (×3): 30 mg via ORAL
  Filled 2011-12-30 (×5): qty 1

## 2011-12-30 MED ORDER — METRONIDAZOLE 500 MG PO TABS
500.0000 mg | ORAL_TABLET | Freq: Three times a day (TID) | ORAL | Status: DC
  Administered 2011-12-30 – 2012-01-02 (×7): 500 mg via ORAL
  Filled 2011-12-30 (×11): qty 1

## 2011-12-30 MED ORDER — GEMFIBROZIL 600 MG PO TABS
600.0000 mg | ORAL_TABLET | Freq: Two times a day (BID) | ORAL | Status: DC
  Administered 2011-12-31: 600 mg via ORAL
  Filled 2011-12-30 (×4): qty 1

## 2011-12-30 MED ORDER — SODIUM CHLORIDE 0.9 % IV SOLN
1000.0000 mL | Freq: Once | INTRAVENOUS | Status: AC
  Administered 2011-12-30: 1000 mL via INTRAVENOUS

## 2011-12-30 MED ORDER — ALUM & MAG HYDROXIDE-SIMETH 200-200-20 MG/5ML PO SUSP
30.0000 mL | Freq: Four times a day (QID) | ORAL | Status: DC | PRN
  Filled 2011-12-30: qty 30

## 2011-12-30 MED ORDER — INSULIN ASPART PROT & ASPART (70-30 MIX) 100 UNIT/ML ~~LOC~~ SUSP
25.0000 [IU] | Freq: Two times a day (BID) | SUBCUTANEOUS | Status: DC
  Administered 2011-12-31: 25 [IU] via SUBCUTANEOUS
  Filled 2011-12-30: qty 3

## 2011-12-30 MED ORDER — ACETAMINOPHEN 650 MG RE SUPP: 650.0000 mg | Freq: Four times a day (QID) | RECTAL | Status: DC | PRN

## 2011-12-30 MED ORDER — INSULIN ASPART 100 UNIT/ML ~~LOC~~ SOLN
0.0000 [IU] | Freq: Three times a day (TID) | SUBCUTANEOUS | Status: DC
  Administered 2012-01-01 – 2012-01-02 (×2): 3 [IU] via SUBCUTANEOUS
  Administered 2012-01-02: 5 [IU] via SUBCUTANEOUS
  Administered 2012-01-02: 2 [IU] via SUBCUTANEOUS
  Administered 2012-01-03 (×2): 3 [IU] via SUBCUTANEOUS
  Administered 2012-01-03: 2 [IU] via SUBCUTANEOUS
  Administered 2012-01-04 – 2012-01-06 (×4): 3 [IU] via SUBCUTANEOUS
  Administered 2012-01-07: 2 [IU] via SUBCUTANEOUS
  Administered 2012-01-07 – 2012-01-08 (×3): 3 [IU] via SUBCUTANEOUS
  Administered 2012-01-09: 2 [IU] via SUBCUTANEOUS
  Administered 2012-01-10: 5 [IU] via SUBCUTANEOUS
  Administered 2012-01-10: 3 [IU] via SUBCUTANEOUS
  Administered 2012-01-10 – 2012-01-11 (×2): 5 [IU] via SUBCUTANEOUS
  Administered 2012-01-11 – 2012-01-12 (×2): 3 [IU] via SUBCUTANEOUS
  Administered 2012-01-12: 5 [IU] via SUBCUTANEOUS
  Administered 2012-01-13: 8 [IU] via SUBCUTANEOUS
  Administered 2012-01-13 (×2): 5 [IU] via SUBCUTANEOUS
  Administered 2012-01-14: 8 [IU] via SUBCUTANEOUS
  Administered 2012-01-14: 11 [IU] via SUBCUTANEOUS
  Administered 2012-01-14: 8 [IU] via SUBCUTANEOUS
  Administered 2012-01-15: 3 [IU] via SUBCUTANEOUS

## 2011-12-30 MED ORDER — SODIUM CHLORIDE 0.9 % IV SOLN
1000.0000 mL | INTRAVENOUS | Status: DC
  Administered 2011-12-30: 1000 mL via INTRAVENOUS

## 2011-12-30 MED ORDER — HYDROCODONE-ACETAMINOPHEN 5-325 MG PO TABS
1.0000 | ORAL_TABLET | ORAL | Status: DC | PRN
  Administered 2012-01-06: 1 via ORAL
  Filled 2011-12-30: qty 1

## 2011-12-30 MED ORDER — ONDANSETRON HCL 4 MG PO TABS: 4.0000 mg | ORAL_TABLET | Freq: Four times a day (QID) | ORAL | Status: DC | PRN

## 2011-12-30 MED ORDER — SODIUM CHLORIDE 0.9 % IV SOLN
1000.0000 mL | INTRAVENOUS | Status: DC
  Administered 2011-12-31 – 2012-01-01 (×3): 1000 mL via INTRAVENOUS

## 2011-12-30 MED ORDER — INSULIN ASPART 100 UNIT/ML ~~LOC~~ SOLN: 0.0000 [IU] | Freq: Every day | SUBCUTANEOUS | Status: DC

## 2011-12-30 MED ORDER — ONDANSETRON HCL 4 MG/2ML IJ SOLN: 4.0000 mg | Freq: Four times a day (QID) | INTRAMUSCULAR | Status: DC | PRN

## 2011-12-30 NOTE — H&P (Signed)
PCP:   Arnette Norris, MD   Chief Complaint:  Weakness  HPI: This is a 78 year old gentleman who on Friday developed severe diarrhea. He has some mild abdominal pain. Later that day he started having several episodes of bright red blood per rectum. They went to [facility] and waited in the ER for 5-6 hours. During the wait the patient's bleeding stopped. He had not been seen, he decided to go home. His diarrhea did continue both but with no further bleeding. Today he did not feel good, he slept all day, had a poor appetite. His daughter decided to take him to the ER. He has not had abdominal pain since Friday. He's never had a colonoscopy. He's not previously had blood in his stool. He reports no weight loss, fever, chills or previous constipation. He denies any lightheadedness or dizziness. History provided by his daughters who is at the bedside. The patient's alert and oriented but hard of hearing.  Review of Systems:  The patient denies anorexia, fever, weight loss,, vision loss, decreased hearing, hoarseness, chest pain, syncope, dyspnea on exertion, peripheral edema, balance deficits, hemoptysis, severe indigestion/heartburn, hematuria, incontinence, genital sores, muscle weakness, suspicious skin lesions, transient blindness, difficulty walking, depression, unusual weight change, abnormal bleeding, enlarged lymph nodes, angioedema, and breast masses.  Past Medical History: Past Medical History  Diagnosis Date  . Bradycardia   . Diabetes mellitus   . Hypertension   . Chronic kidney disease     BAseline creatinine 1.4 to 1.5  . Arrhythmia     Baseline bradycardia   History reviewed. No pertinent past surgical history.  Medications: Prior to Admission medications   Medication Sig Start Date End Date Taking? Authorizing Provider  aspirin 81 MG tablet Take 81 mg by mouth daily.     Yes Historical Provider, MD  cinacalcet (SENSIPAR) 30 MG tablet Take 30 mg by mouth 2 (two)  times a week. Takes on Wed and Sun   Yes Historical Provider, MD  furosemide (LASIX) 40 MG tablet Take 40 mg by mouth every other day.    Yes Historical Provider, MD  gemfibrozil (LOPID) 600 MG tablet Take 600 mg by mouth 2 (two) times daily before a meal.   Yes Historical Provider, MD  insulin NPH-insulin regular (HUMULIN 70/30) (70-30) 100 UNIT/ML injection Inject 40-50 Units into the skin 2 (two) times daily with a meal. Takes 50 units in the am & 40 units in the pm   Yes Historical Provider, MD  lisinopril (PRINIVIL,ZESTRIL) 20 MG tablet Take 20 mg by mouth daily.   Yes Historical Provider, MD    Allergies:   Allergies  Allergen Reactions  . Penicillins Other (See Comments)    Reaction as a child    Social History:  reports that he has never smoked. His smokeless tobacco use includes Chew. His alcohol and drug histories not on file. Lives at home with sun, doesn't use a cane or walker.  Family History: Family History  Problem Relation Age of Onset  . Diabetes type II Mother   . Hypertension Mother   . Diabetes type II Father   . Hypertension Father     Physical Exam: Filed Vitals:   12/30/11 1633 12/30/11 1647 12/30/11 1724 12/30/11 1821  BP: 85/40 88/45 94/38  108/47  Pulse: 75 72    Temp: 97.9 F (36.6 C) 97.9 F (36.6 C)  98.6 F (37 C)  TempSrc: Oral Oral  Oral  Resp: 28 20  18   SpO2: 99% 97%  97%    General:  Alert and oriented times three, obese, no acute distress Eyes: PERRLA, pink conjunctiva, no scleral icterus ENT: Moist oral mucosa, neck supple, no thyromegaly Lungs: clear to ascultation, no wheeze, no crackles, no use of accessory muscles Cardiovascular: regular rate and rhythm, no regurgitation, no gallops, no murmurs. No carotid bruits, no JVD Abdomen: soft, positive BS, non-tender, non-distended, no organomegaly, not an acute abdomen GU: not examined Neuro: CN II - XII grossly intact, sensation intact Musculoskeletal: strength 5/5 all extremities,  no clubbing, cyanosis or edema Skin: no rash, no subcutaneous crepitation, no decubitus Psych: appropriate patient   Labs on Admission:   [facility] 12/30/11 1720  NA 138  K 4.3  CL 104  CO2 17*  GLUCOSE 89  BUN 44*  CREATININE 3.29*  CALCIUM 10.5  MG --  PHOS --    Basename 12/30/11 1720  AST 21  ALT 11  ALKPHOS 113  BILITOT 0.5  PROT 6.7  ALBUMIN 3.1*    Basename 12/30/11 1720  LIPASE 11  AMYLASE --    Basename 12/30/11 1720  WBC 17.6*  NEUTROABS 13.2*  HGB 16.4  HCT 48.5  MCV 84.6  PLT 161    Micro Results: No results found for this or any previous visit (from the past 240 hour(s)).   Radiological Exams on Admission: No results found.  Assessment/Plan Present on Admission:  . Acute-on-chronic kidney injury Diarrhea Dehydration/transient hypotension Admit to telemetry Continue IV fluid hydration  C. difficile toxins ordered as well as Flagyl started Clear liquid diet Repeat BMP in a.m. ACE inhibitor and baby aspirin held  Right red blood per rectum Resolved since Friday. As stated aspirin discontinued  Consult GI in a.m. May have been a diverticular bleed or bleeding due to patient's severe diarrhea. Patient needs inpatient versus outpatient colonoscopy . Diabetes mellitus with kidney disease . DIABETIC PERIPHERAL NEUROPATHY Patient's insulin resumed at half dosage as patient's diet has been restricted  . HYPERTRIGLYCERIDEMIA . OBESITY . HYPERTENSION . Bradycardia  home medications resumed except as noted above  DO NOT RESUSCITATE SCDs for DVT prophylaxis  Tyrique Sporn 12/30/2011, 7:51 PM

## 2011-12-30 NOTE — ED Notes (Signed)
Per pt he has had diarrhea for about 1 week. Was seen at [facility] on Friday. sts he hasn't gotten any better.

## 2011-12-30 NOTE — ED Notes (Signed)
Pt states his daughter Damon [name] took is shoes, clothes with her home

## 2011-12-30 NOTE — ED Provider Notes (Signed)
History     CSN: [id number]  Arrival date & time 12/30/11  1619   First MD Initiated Contact with Patient 12/30/11 1658      Chief Complaint  Patient presents with  . Diarrhea    (Consider location/radiation/quality/duration/timing/severity/associated sxs/prior treatment) HPI Patient with diarrhea since 12/28/2011 reports having approximately 5 or 6 episodes today also has had diminished appetite for the past 2 days, no vomiting. 8 eczema sauces this morning tried to eat spaghetti this afternoon, however appetite is diminished he denies fever denies abdominal pain states presently "I feel fine." Patient seen at [facility] emergency department 2 days ago for same complaint, released. Patient has taken no medications for diarrhea. Last bowel movement was this afternoon. Patient denies lightheadedness on standing denies fever no blood parenchyma no other associated symptoms. No recent travel, no recent antibiotics, no recent hospitalizations Past Medical History  Diagnosis Date  . Bradycardia   . Diabetes mellitus   . Hypertension   . Chronic kidney disease     BAseline creatinine 1.4 to 1.5  . Arrhythmia     Baseline bradycardia    History reviewed. No pertinent past surgical history.  Family History  Problem Relation Age of Onset  . Diabetes type II Mother   . Hypertension Mother   . Diabetes type II Father   . Hypertension Father     History  Substance Use Topics  . Smoking status: Never Smoker   . Smokeless tobacco: Current User    Types: Chew     Comment: chewing more than 40 years/chew 1PPD  . Alcohol Use: Not on file      Review of Systems  Constitutional: Positive for appetite change.  HENT: Negative.   Respiratory: Negative.   Cardiovascular: Negative.   Gastrointestinal: Positive for diarrhea.  Musculoskeletal: Negative.   Skin: Negative.   Neurological: Negative.   Hematological: Negative.   Psychiatric/Behavioral: Negative.      Allergies  Penicillins  Home Medications   Current Outpatient Rx  Name  Route  Sig  Dispense  Refill  . ASPIRIN 81 MG PO TABS   Oral   Take 81 mg by mouth daily.           Marland Kitchen CINACALCET HCL 30 MG PO TABS   Oral   Take 30 mg by mouth 2 (two) times a week. Takes on Wed and Sun         . FUROSEMIDE 40 MG PO TABS   Oral   Take 40 mg by mouth every other day.          Marland Kitchen GEMFIBROZIL 600 MG PO TABS   Oral   Take 600 mg by mouth 2 (two) times daily before a meal.         . INSULIN ISOPHANE & REGULAR (70-30) 100 UNIT/ML [place] SUSP   Subcutaneous   Inject 40-50 Units into the skin 2 (two) times daily with a meal. Takes 50 units in the am & 40 units in the pm         . LISINOPRIL 20 MG PO TABS   Oral   Take 20 mg by mouth daily.           BP 88/45  Pulse 72  Temp 97.9 F (36.6 C) (Oral)  Resp 20  SpO2 97%  Physical Exam  Nursing note and vitals reviewed. Constitutional: He appears well-developed and well-nourished.       Mucous membranes dry  HENT:  Head: Normocephalic and atraumatic.  Eyes: Conjunctivae normal are normal. Pupils are equal, round, and reactive to light.  Neck: Neck supple. No tracheal deviation present. No thyromegaly present.  Cardiovascular: Normal rate and regular rhythm.   No murmur heard. Pulmonary/Chest: Effort normal and breath sounds normal.  Abdominal: Soft. Bowel sounds are normal. He exhibits no distension and no mass. There is no tenderness. There is no rebound and no guarding.       Obese  Genitourinary: Penis normal.       Normal male genitalia  Musculoskeletal: Normal range of motion. He exhibits no edema and no tenderness.  Neurological: He is alert. Coordination normal.  Skin: Skin is warm and dry. No rash noted.  Psychiatric: He has a normal mood and affect.    ED Course  Procedures (including critical care time)   Labs Reviewed  CBC WITH DIFFERENTIAL  LIPASE, BLOOD  COMPREHENSIVE METABOLIC PANEL  URINALYSIS,  MICROSCOPIC ONLY   No results found.   No diagnosis found.    MDM  Patient likely hypotensive due to dehydration. Plan CDU dehydration protocol. IV hydration       Orlie Dakin, MD 12/30/11 1734

## 2011-12-30 NOTE — ED Notes (Signed)
Attempted to collect urine, pt could not void 

## 2011-12-31 DIAGNOSIS — E1129 Type 2 diabetes mellitus with other diabetic kidney complication: Secondary | ICD-10-CM

## 2011-12-31 DIAGNOSIS — N058 Unspecified nephritic syndrome with other morphologic changes: Secondary | ICD-10-CM

## 2011-12-31 DIAGNOSIS — K921 Melena: Secondary | ICD-10-CM

## 2011-12-31 DIAGNOSIS — I10 Essential (primary) hypertension: Secondary | ICD-10-CM

## 2011-12-31 DIAGNOSIS — E1169 Type 2 diabetes mellitus with other specified complication: Secondary | ICD-10-CM

## 2011-12-31 LAB — CBC
HCT: 47.5 % (ref 39.0–52.0)
Hemoglobin: 15.9 g/dL (ref 13.0–17.0)
MCH: 28.3 pg (ref 26.0–34.0)
MCHC: 33.5 g/dL (ref 30.0–36.0)
MCV: 84.5 fL (ref 78.0–100.0)
Platelets: 158 10*3/uL (ref 150–400)
RBC: 5.62 MIL/uL (ref 4.22–5.81)
RDW: 15.8 % — ABNORMAL HIGH (ref 11.5–15.5)
WBC: 14 10*3/uL — ABNORMAL HIGH (ref 4.0–10.5)

## 2011-12-31 LAB — GLUCOSE, CAPILLARY
Glucose-Capillary: 82 mg/dL (ref 70–99)
Glucose-Capillary: 108 mg/dL — ABNORMAL HIGH (ref 70–99)
Glucose-Capillary: 106 mg/dL — ABNORMAL HIGH (ref 70–99)
Glucose-Capillary: 139 mg/dL — ABNORMAL HIGH (ref 70–99)

## 2011-12-31 LAB — HEMOGLOBIN A1C
Hgb A1c MFr Bld: 6.8 % — ABNORMAL HIGH (ref <5.7)
Mean Plasma Glucose: 148 mg/dL — ABNORMAL HIGH (ref <117)

## 2011-12-31 LAB — COMPREHENSIVE METABOLIC PANEL
ALT: 9 U/L (ref 0–53)
AST: 12 U/L (ref 0–37)
Albumin: 2.7 g/dL — ABNORMAL LOW (ref 3.5–5.2)
Alkaline Phosphatase: 105 U/L (ref 39–117)
BUN: 45 mg/dL — ABNORMAL HIGH (ref 6–23)
CO2: 16 mEq/L — ABNORMAL LOW (ref 19–32)
Calcium: 9.4 mg/dL (ref 8.4–10.5)
Chloride: 110 mEq/L (ref 96–112)
Creatinine, Ser: 2.77 mg/dL — ABNORMAL HIGH (ref 0.50–1.35)
GFR calc Af Amer: 24 mL/min — ABNORMAL LOW (ref >90)
GFR calc non Af Amer: 21 mL/min — ABNORMAL LOW (ref >90)
Glucose, Bld: 69 mg/dL — ABNORMAL LOW (ref 70–99)
Potassium: 3.8 mEq/L (ref 3.5–5.1)
Sodium: 138 mEq/L (ref 135–145)
Total Bilirubin: 0.6 mg/dL (ref 0.3–1.2)
Total Protein: 5.9 g/dL — ABNORMAL LOW (ref 6.0–8.3)

## 2011-12-31 LAB — CLOSTRIDIUM DIFFICILE BY PCR: Toxigenic C. Difficile by PCR: NEGATIVE

## 2011-12-31 MED ORDER — SODIUM CHLORIDE 0.9 % IV SOLN: INTRAVENOUS | Status: DC

## 2011-12-31 MED ORDER — PEG 3350-KCL-NA BICARB-NACL 420 G PO SOLR
4000.0000 mL | Freq: Once | ORAL | Status: AC
  Administered 2012-01-01: 4000 mL via ORAL
  Filled 2011-12-31: qty 4000

## 2011-12-31 MED ORDER — SODIUM BICARBONATE 650 MG PO TABS
650.0000 mg | ORAL_TABLET | Freq: Two times a day (BID) | ORAL | Status: DC
  Administered 2011-12-31 – 2012-01-08 (×15): 650 mg via ORAL
  Filled 2011-12-31 (×19): qty 1

## 2011-12-31 MED ORDER — INSULIN ASPART PROT & ASPART (70-30 MIX) 100 UNIT/ML ~~LOC~~ SUSP
20.0000 [IU] | Freq: Two times a day (BID) | SUBCUTANEOUS | Status: DC
  Administered 2011-12-31 – 2012-01-05 (×10): 20 [IU] via SUBCUTANEOUS
  Filled 2011-12-31: qty 3

## 2011-12-31 NOTE — Consult Note (Signed)
[facility] CONSULT Reason for consult: Diarrhea and GI bleeding Referring Physician: [facility]. PCP: Arnette Norris MD. Primary GI: None patient is unassigned  Damon [name] is an 78 y.o. male.  HPI: He was admitted after having sudden onset of diarrhea minimal abdominal pain and rectal bleeding 3 days ago. He went to the [facility]. He was in the ER there for several hours and no more bleeding at home. He had more diarrhea but no further bleeding the next day. He apparently had some abdominal pain Friday and had passage of several more loose bloody stools and apparently has resolved. Patient states that he is pain-free and watching television. He denies constipation or diarrhea chronically. He's never had colonoscopy but did have what sounds to be a rigid sigmoidoscope about 20 years ago and was told he had hemorrhoids. Currently he is asymptomatic. No family history of colon cancer.  Past Medical History  Diagnosis Date  . Bradycardia   . Diabetes mellitus   . Hypertension   . Chronic kidney disease     BAseline creatinine 1.4 to 1.5  . Arrhythmia     Baseline bradycardia    History reviewed. No pertinent past surgical history.  Family History  Problem Relation Age of Onset  . Diabetes type II Mother   . Hypertension Mother   . Diabetes type II Father   . Hypertension Father     Social History:  reports that he has never smoked. His smokeless tobacco use includes Chew. His alcohol and drug histories not on file.  Allergies:  Allergies  Allergen Reactions  . Penicillins Other (See Comments)    Reaction as a child    Medications;    . [COMPLETED] sodium chloride  1,000 mL Intravenous Once  . [COMPLETED] sodium chloride  1,000 mL Intravenous Once  . cinacalcet  30 mg Oral Custom  . gemfibrozil  600 mg Oral BID AC  . insulin aspart  0-15 Units Subcutaneous TID WC  . insulin aspart protamine-insulin aspart  20 Units Subcutaneous BID WC  .  metroNIDAZOLE  500 mg Oral Q8H  . sodium bicarbonate  650 mg Oral BID  . [DISCONTINUED] insulin aspart  0-5 Units Subcutaneous QHS  . [DISCONTINUED] insulin aspart protamine-insulin aspart  25 Units Subcutaneous BID WC   PRN Meds acetaminophen, acetaminophen, alum & mag hydroxide-simeth, HYDROcodone-acetaminophen, ondansetron (ZOFRAN) IV, ondansetron Results for orders placed during the hospital encounter of 12/30/11 (from the past 48 hour(s))  CBC WITH DIFFERENTIAL     Status: Abnormal   Collection Time   12/30/11  5:20 PM      Component Value Range Comment   WBC 17.6 (*) 4.0 - 10.5 K/uL    RBC 5.73  4.22 - 5.81 MIL/uL    Hemoglobin 16.4  13.0 - 17.0 g/dL    HCT 48.5  39.0 - 52.0 %    MCV 84.6  78.0 - 100.0 fL    MCH 28.6  26.0 - 34.0 pg    MCHC 33.8  30.0 - 36.0 g/dL    RDW 15.8 (*) 11.5 - 15.5 %    Platelets 161  150 - 400 K/uL    Neutrophils Relative 75  43 - 77 %    Neutro Abs 13.2 (*) 1.7 - 7.7 K/uL    Lymphocytes Relative 14  12 - 46 %    Lymphs Abs 2.4  0.7 - 4.0 K/uL    Monocytes Relative 11  3 - 12 %    Monocytes Absolute  1.9 (*) 0.1 - 1.0 K/uL    Eosinophils Relative 0  0 - 5 %    Eosinophils Absolute 0.1  0.0 - 0.7 K/uL    Basophils Relative 0  0 - 1 %    Basophils Absolute 0.0  0.0 - 0.1 K/uL   LIPASE, BLOOD     Status: Normal   Collection Time   12/30/11  5:20 PM      Component Value Range Comment   Lipase 11  11 - 59 U/L   COMPREHENSIVE METABOLIC PANEL     Status: Abnormal   Collection Time   12/30/11  5:20 PM      Component Value Range Comment   Sodium 138  135 - 145 mEq/L    Potassium 4.3  3.5 - 5.1 mEq/L HEMOLYSIS AT THIS LEVEL MAY AFFECT RESULT   Chloride 104  96 - 112 mEq/L    CO2 17 (*) 19 - 32 mEq/L    Glucose, Bld 89  70 - 99 mg/dL    BUN 44 (*) 6 - 23 mg/dL    Creatinine, Ser 3.29 (*) 0.50 - 1.35 mg/dL    Calcium 10.5  8.4 - 10.5 mg/dL    Total Protein 6.7  6.0 - 8.3 g/dL    Albumin 3.1 (*) 3.5 - 5.2 g/dL    AST 21  0 - 37 U/L    ALT 11  0 - 53  U/L    Alkaline Phosphatase 113  39 - 117 U/L    Total Bilirubin 0.5  0.3 - 1.2 mg/dL    GFR calc non Af Amer 17 (*) >90 mL/min    GFR calc Af Amer 19 (*) >90 mL/min   CLOSTRIDIUM DIFFICILE BY PCR     Status: Normal   Collection Time   12/30/11  9:13 PM      Component Value Range Comment   C difficile by pcr NEGATIVE  NEGATIVE   GLUCOSE, CAPILLARY     Status: Abnormal   Collection Time   12/30/11  9:34 PM      Component Value Range Comment   Glucose-Capillary 105 (*) 70 - 99 mg/dL    Comment 1 Documented in Chart      Comment 2 Notify RN     URINALYSIS, MICROSCOPIC ONLY     Status: Abnormal   Collection Time   12/30/11  9:35 PM      Component Value Range Comment   Color, Urine YELLOW  YELLOW    APPearance CLOUDY (*) CLEAR    Specific Gravity, Urine 1.018  1.005 - 1.030    pH 5.0  5.0 - 8.0    Glucose, UA NEGATIVE  NEGATIVE mg/dL    Hgb urine dipstick NEGATIVE  NEGATIVE    Bilirubin Urine NEGATIVE  NEGATIVE    Ketones, ur NEGATIVE  NEGATIVE mg/dL    Protein, ur NEGATIVE  NEGATIVE mg/dL    Urobilinogen, UA 0.2  0.0 - 1.0 mg/dL    Nitrite NEGATIVE  NEGATIVE    Leukocytes, UA NEGATIVE  NEGATIVE    WBC, UA 0-2  <3 WBC/hpf    RBC / HPF 0-2  <3 RBC/hpf    Bacteria, UA MANY (*) RARE    Squamous Epithelial / LPF MANY (*) RARE    Casts GRANULAR CAST (*) NEGATIVE HYALINE CASTS   Crystals CA OXALATE CRYSTALS (*) NEGATIVE   COMPREHENSIVE METABOLIC PANEL     Status: Abnormal   Collection Time   12/31/11  5:10 AM  Component Value Range Comment   Sodium 138  135 - 145 mEq/L    Potassium 3.8  3.5 - 5.1 mEq/L    Chloride 110  96 - 112 mEq/L    CO2 16 (*) 19 - 32 mEq/L    Glucose, Bld 69 (*) 70 - 99 mg/dL    BUN 45 (*) 6 - 23 mg/dL    Creatinine, Ser 2.77 (*) 0.50 - 1.35 mg/dL    Calcium 9.4  8.4 - 10.5 mg/dL    Total Protein 5.9 (*) 6.0 - 8.3 g/dL    Albumin 2.7 (*) 3.5 - 5.2 g/dL    AST 12  0 - 37 U/L    ALT 9  0 - 53 U/L    Alkaline Phosphatase 105  39 - 117 U/L    Total  Bilirubin 0.6  0.3 - 1.2 mg/dL    GFR calc non Af Amer 21 (*) >90 mL/min    GFR calc Af Amer 24 (*) >90 mL/min   CBC     Status: Abnormal   Collection Time   12/31/11  5:10 AM      Component Value Range Comment   WBC 14.0 (*) 4.0 - 10.5 K/uL    RBC 5.62  4.22 - 5.81 MIL/uL    Hemoglobin 15.9  13.0 - 17.0 g/dL    HCT 47.5  39.0 - 52.0 %    MCV 84.5  78.0 - 100.0 fL    MCH 28.3  26.0 - 34.0 pg    MCHC 33.5  30.0 - 36.0 g/dL    RDW 15.8 (*) 11.5 - 15.5 %    Platelets 158  150 - 400 K/uL   GLUCOSE, CAPILLARY     Status: Normal   Collection Time   12/31/11  7:33 AM      Component Value Range Comment   Glucose-Capillary 82  70 - 99 mg/dL    Comment 1 Documented in Chart      Comment 2 Notify RN     GLUCOSE, CAPILLARY     Status: Abnormal   Collection Time   12/31/11 11:31 AM      Component Value Range Comment   Glucose-Capillary 108 (*) 70 - 99 mg/dL    Comment 1 Documented in Chart      Comment 2 Notify RN       No results found.             Blood pressure 110/44, pulse 63, temperature 98.2 F (36.8 C), temperature source Oral, resp. rate 18, height 5\' 5"  (1.651 m), weight 94.5 kg (208 lb 5.4 oz), SpO2 97.00%.  Physical exam:   Gen.-obese white male no acute distress lying comfortably in bed Eyes-sclerae are nonicteric Lungs-clear Heart-regular rate and rhythm without murmurs or gallops Abdomen-quite obese nontender with excellent bowel sounds   Assessment: 1. Diarrhea and rectal bleeding. This could be ischemic, infectious, diverticular hemorrhoids. This is new onset he's never had a colonoscopy evaluation I agree that should be done.  Plan: We'll go ahead and plan a colonoscopy tomorrow at 3:30. I discussed the procedure including the fact we may need to obtain biopsies were removed polyps if found. We discussed the slight risk of polypectomy et Ronney Asters. Patient is agreeable to going ahead.   Casanova Schurman JR,Bertran Zeimet L 12/31/2011, 3:59 PM

## 2011-12-31 NOTE — Progress Notes (Addendum)
[facility] PROGRESS NOTE  Damon [name] [id number] DOB: 19-May-1933 DOA: 12/30/2011 PCP: Arnette Norris, MD  Assessment/Plan: 1. Generalized weakness: secondary to dehydration and ongoing diarrhea. Will continue IVF's and supportive care. 2. Diarrhea and bright red blood in stool: will consult GI, could be secondary to hemorrhoids vs diverticulosis vs AVM's. Hgb stable, C. Diff negative. Stool cx pending. Will continue flagyl for now. Continue holding ASA 3. Acute on chronic renal failure: secondary to dehydration, hypotension and continue use of ACE. Continue holding ACE; continue IVF's. Cr better today; repeat BMET in am. 4. DM: continue SSI and 70/30 (dose decrease from home dose due to CLD). Will check A1C 5. Hypertriglyceridemia: continue gemfibrozil after creatinine < 2. 6. Metabolic acidosis: 2/2 diarrhea, dehydration and acute on chronic kidney disease. Will replete bicarb, provide ivf's and follow electrolytes trend. 7. DVT:SCD's   Code Status: Full Family Communication: daughter at bedside Disposition Plan: home when medically stable.   Consultants:  GI ([facility], Dr. Oletta Lamas)  Antibiotics:  Flagyl 12/8  HPI/Subjective: Afebrile; no CP, no SOB, no abdominal pain. Patient still with blood in his stool. C. Diff neg  Objective: Filed Vitals:   12/31/11 0032 12/31/11 0455 12/31/11 0854 12/31/11 1415  BP:  99/62 96/39 110/44  Pulse:  72 79 63  Temp:  97.3 F (36.3 C) 98 F (36.7 C) 98.2 F (36.8 C)  TempSrc:  Oral Oral Oral  Resp:  20 18 18   Height: 5\' 5"  (1.651 m)     Weight:      SpO2:  95% 96% 97%    Intake/Output Summary (Last 24 hours) at 12/31/11 1517 Last data filed at 12/31/11 1100  Gross per 24 hour  Intake   1240 ml  Output    152 ml  Net   1088 ml   Filed Weights   12/30/11 2126  Weight: 94.5 kg (208 lb 5.4 oz)    Exam:   General:  NAD, afebrile.  Cardiovascular: S1 and S2, no rubs or gallops  Respiratory: CTA bilaterally  Abdomen:  soft, NT, positive BS  Neuro: non focal  Data Reviewed: Basic Metabolic Panel:  Lab [id number] 12/30/11 1720  NA 138 138  K 3.8 4.3  CL 110 104  CO2 16* 17*  GLUCOSE 69* 89  BUN 45* 44*  CREATININE 2.77* 3.29*  CALCIUM 9.4 10.5  MG -- --  PHOS -- --   Liver Function Tests:  Lab 12/31/11 0510 12/30/11 1720  AST 12 21  ALT 9 11  ALKPHOS 105 113  BILITOT 0.6 0.5  PROT 5.9* 6.7  ALBUMIN 2.7* 3.1*    Lab 12/30/11 1720  LIPASE 11  AMYLASE --   CBC:  Lab 12/31/11 0510 12/30/11 1720  WBC 14.0* 17.6*  NEUTROABS -- 13.2*  HGB 15.9 16.4  HCT 47.5 48.5  MCV 84.5 84.6  PLT 158 161   CBG:  Lab 12/31/11 1131 12/31/11 0733 12/30/11 2134  GLUCAP 108* 82 105*    Recent Results (from the past 240 hour(s))  CLOSTRIDIUM DIFFICILE BY PCR     Status: Normal   Collection Time   12/30/11  9:13 PM      Component Value Range Status Comment   C difficile by pcr NEGATIVE  NEGATIVE Final      Studies: No results found.  Scheduled Meds:   . [COMPLETED] sodium chloride  1,000 mL Intravenous Once  . [COMPLETED] sodium chloride  1,000 mL Intravenous Once  . cinacalcet  30 mg Oral Custom  .  gemfibrozil  600 mg Oral BID AC  . insulin aspart  0-15 Units Subcutaneous TID WC  . insulin aspart protamine-insulin aspart  25 Units Subcutaneous BID WC  . metroNIDAZOLE  500 mg Oral Q8H  . sodium bicarbonate  650 mg Oral BID  . [DISCONTINUED] insulin aspart  0-5 Units Subcutaneous QHS   Continuous Infusions:   . sodium chloride    . [DISCONTINUED] sodium chloride 1,000 mL (12/30/11 2030)    Time spent: >30 minutes    Jayni Prescher  [facility] Pager [phone]. If 8PM-8AM, please contact night-coverage at [email or web address], password [facility] 12/31/2011, 3:17 PM  LOS: 1 day

## 2012-01-01 ENCOUNTER — Encounter (HOSPITAL_COMMUNITY): Payer: Self-pay | Admitting: *Deleted

## 2012-01-01 ENCOUNTER — Encounter (HOSPITAL_COMMUNITY)
Admission: EM | Disposition: A | Discharge status: Skilled Nursing Facility | Payer: Self-pay | Source: Home / Self Care | Attending: Internal Medicine

## 2012-01-01 HISTORY — PX: COLONOSCOPY: SHX5424

## 2012-01-01 LAB — CBC
HCT: 48.6 % (ref 39.0–52.0)
Hemoglobin: 16.3 g/dL (ref 13.0–17.0)
MCH: 28.6 pg (ref 26.0–34.0)
MCHC: 33.5 g/dL (ref 30.0–36.0)
MCV: 85.3 fL (ref 78.0–100.0)
Platelets: 135 10*3/uL — ABNORMAL LOW (ref 150–400)
RBC: 5.7 MIL/uL (ref 4.22–5.81)
RDW: 15.6 % — ABNORMAL HIGH (ref 11.5–15.5)
WBC: 10.9 10*3/uL — ABNORMAL HIGH (ref 4.0–10.5)

## 2012-01-01 LAB — GLUCOSE, CAPILLARY
Glucose-Capillary: 83 mg/dL (ref 70–99)
Glucose-Capillary: 93 mg/dL (ref 70–99)
Glucose-Capillary: 100 mg/dL — ABNORMAL HIGH (ref 70–99)
Glucose-Capillary: 120 mg/dL — ABNORMAL HIGH (ref 70–99)
Glucose-Capillary: 147 mg/dL — ABNORMAL HIGH (ref 70–99)
Glucose-Capillary: 159 mg/dL — ABNORMAL HIGH (ref 70–99)
Glucose-Capillary: 155 mg/dL — ABNORMAL HIGH (ref 70–99)
Glucose-Capillary: 158 mg/dL — ABNORMAL HIGH (ref 70–99)

## 2012-01-01 LAB — BASIC METABOLIC PANEL
BUN: 39 mg/dL — ABNORMAL HIGH (ref 6–23)
CO2: 17 mEq/L — ABNORMAL LOW (ref 19–32)
Calcium: 9.3 mg/dL (ref 8.4–10.5)
Chloride: 111 mEq/L (ref 96–112)
Creatinine, Ser: 2.05 mg/dL — ABNORMAL HIGH (ref 0.50–1.35)
GFR calc Af Amer: 35 mL/min — ABNORMAL LOW (ref >90)
GFR calc non Af Amer: 30 mL/min — ABNORMAL LOW (ref >90)
Glucose, Bld: 93 mg/dL (ref 70–99)
Potassium: 3.9 mEq/L (ref 3.5–5.1)
Sodium: 139 mEq/L (ref 135–145)

## 2012-01-01 SURGERY — COLONOSCOPY
Anesthesia: Moderate Sedation

## 2012-01-01 MED ORDER — MIDAZOLAM HCL 5 MG/ML IJ SOLN
INTRAMUSCULAR | Status: AC
  Filled 2012-01-01: qty 2

## 2012-01-01 MED ORDER — SODIUM CHLORIDE 0.9 % IV SOLN
1000.0000 mL | INTRAVENOUS | Status: DC
  Administered 2012-01-02 – 2012-01-03 (×2): 1000 mL via INTRAVENOUS

## 2012-01-01 MED ORDER — FENTANYL CITRATE 0.05 MG/ML IJ SOLN
INTRAMUSCULAR | Status: AC
  Filled 2012-01-01: qty 2

## 2012-01-01 MED ORDER — FENTANYL CITRATE 0.05 MG/ML IJ SOLN
INTRAMUSCULAR | Status: DC | PRN
  Administered 2012-01-01 (×2): 25 ug via INTRAVENOUS

## 2012-01-01 MED ORDER — MIDAZOLAM HCL 5 MG/5ML IJ SOLN
INTRAMUSCULAR | Status: DC | PRN
  Administered 2012-01-01 (×2): 2 mg via INTRAVENOUS
  Administered 2012-01-01: 1 mg via INTRAVENOUS

## 2012-01-01 NOTE — Progress Notes (Signed)
[facility] PROGRESS NOTE  Damon [name] [id number] DOB: 1933/05/17 DOA: 12/30/2011 PCP: Arnette Norris, MD  Assessment/Plan: 1. Generalized weakness: secondary to dehydration and ongoing diarrhea. Will continue IVF's and supportive care. 2. Diarrhea and bright red blood in stool: will follow GI recommendations and colonoscopy results, could be secondary to hemorrhoids vs diverticulosis vs AVM's. Hgb remains stable, C. Diff negative. Stool cx pending. Will continue flagyl for now. Continue holding ASA. 3. Acute on chronic renal failure: secondary to dehydration, hypotension and continue use of ACE. Continue holding ACE; continue IVF's. Cr 2.05 today; repeat BMET in am. 4. DM: continue SSI and 70/30 (dose decrease from home dose due to CLD/NPO for colonoscopy). A1C 6.8 5. Hypertriglyceridemia: Resume gemfibrozil after creatinine < 2; follow heart healthy low fat diet 6. Metabolic acidosis: 2/2 diarrhea, dehydration and acute on chronic kidney disease. Continue bicarb repletion; is better. Will continue ivf's and follow electrolytes trend. 7. DVT:SCD's   Code Status: Full Family Communication: daughter at bedside Disposition Plan: home when medically stable.   Consultants:  GI ([facility], Dr. Oletta Lamas)  Antibiotics:  Flagyl 12/8  HPI/Subjective: Afebrile; no CP, no SOB, no abdominal pain. Patient will have colonoscopy today. C. Diff neg   Objective: Filed Vitals:   01/01/12 1521 01/01/12 1608 01/01/12 1609 01/01/12 1610  BP: 132/66 131/68 131/68 131/68  Pulse:      Temp:      TempSrc:      Resp: 23     Height:      Weight:      SpO2: 94% 98% 98% 98%    Intake/Output Summary (Last 24 hours) at 01/01/12 1638 Last data filed at 01/01/12 1300  Gross per 24 hour  Intake 1778.33 ml  Output     13 ml  Net 1765.33 ml   Filed Weights   12/30/11 2126 12/31/11 2055  Weight: 94.5 kg (208 lb 5.4 oz) 95.482 kg (210 lb 8 oz)    Exam:   General:  NAD, afebrile.  Hungry  Cardiovascular: S1 and S2, no rubs or gallops  Respiratory: CTA bilaterally  Abdomen: soft, NT, positive BS; slightly distended according to patient  Neuro: non focal  Data Reviewed: Basic Metabolic Panel:  Lab [id number] 0505 12/31/11 0510 12/30/11 1720  NA 139 138 138  K 3.9 3.8 4.3  CL 111 110 104  CO2 17* 16* 17*  GLUCOSE 93 69* 89  BUN 39* 45* 44*  CREATININE 2.05* 2.77* 3.29*  CALCIUM 9.3 9.4 10.5  MG -- -- --  PHOS -- -- --   Liver Function Tests:  Lab 12/31/11 0510 12/30/11 1720  AST 12 21  ALT 9 11  ALKPHOS 105 113  BILITOT 0.6 0.5  PROT 5.9* 6.7  ALBUMIN 2.7* 3.1*    Lab 12/30/11 1720  LIPASE 11  AMYLASE --   CBC:  Lab 01/01/12 0505 12/31/11 0510 12/30/11 1720  WBC 10.9* 14.0* 17.6*  NEUTROABS -- -- 13.2*  HGB 16.3 15.9 16.4  HCT 48.6 47.5 48.5  MCV 85.3 84.5 84.6  PLT 135* 158 161   CBG:  Lab 01/01/12 1539 01/01/12 1144 01/01/12 0739 01/01/12 0443 01/01/12 0056  GLUCAP 147* 120* 100* 93 83    Recent Results (from the past 240 hour(s))  CLOSTRIDIUM DIFFICILE BY PCR     Status: Normal   Collection Time   12/30/11  9:13 PM      Component Value Range Status Comment   C difficile by pcr NEGATIVE  NEGATIVE Final  Studies: No results found.  Scheduled Meds:    . cinacalcet  30 mg Oral Custom  . insulin aspart  0-15 Units Subcutaneous TID WC  . insulin aspart protamine-insulin aspart  20 Units Subcutaneous BID WC  . metroNIDAZOLE  500 mg Oral Q8H  . [COMPLETED] polyethylene glycol-electrolytes  4,000 mL Oral Once  . sodium bicarbonate  650 mg Oral BID   Continuous Infusions:    . sodium chloride 1,000 mL (01/01/12 1228)  . sodium chloride    . sodium chloride      Time spent: < 30 minutes    Alexzandra Bilton  [facility] Pager [phone]. If 8PM-8AM, please contact night-coverage at [email or web address], password [facility] 01/01/2012, 4:38 PM  LOS: 2 days

## 2012-01-01 NOTE — Interval H&P Note (Signed)
History and Physical Interval Note:  01/01/2012 4:14 PM  Damon [name]  has presented today for surgery, with the diagnosis of GI bleed  The various methods of treatment have been discussed with the patient and family. After consideration of risks, benefits and other options for treatment, the patient has consented to  Procedure(s) (LRB) with comments: COLONOSCOPY (N/A) as a surgical intervention .  The patient's history has been reviewed, patient examined, no change in status, stable for surgery.  I have reviewed the patient's chart and labs.  Questions were answered to the patient's satisfaction.     Fitzpatrick Alberico JR,Charlestine Rookstool L

## 2012-01-01 NOTE — Op Note (Signed)
[facility] [place], [address]   COLONOSCOPY PROCEDURE REPORT  PATIENT: [name], Damon  MR#: [id number] BIRTHDATE: [birth year] , 60  yrs. old GENDER: Male ENDOSCOPIST: Laurence Spates, MD REFERRED BY:   [facility] PROCEDURE DATE:  01/01/2012 PROCEDURE:    Colonoscopy with Biopsy ASA CLASS:    class III INDICATIONS:  diarrhea and rectal bleeding MEDICATIONS:    fentanyl 50 mcg Versed 6 mg IV  DESCRIPTION OF PROCEDURE: procedure have been explained to the patient and consent obtained. The patient refused most of the prep he had been having a lot of diarrhea. Scope was inserted and advanced. Pentax adult colonoscope use. Using slight abdominal pressure were able to reach the cecum ileocecal valve was identified. There was sticky loose adherent stool throughout. Small polyp could have been missed. Throughout the colon, the mucosa was edematous with what appeared to be some submucosal hemorrhages that were scattered about. There were no ulcerations pseudomembranes polyps masses or diverticula. Multiple random biopsies were taken. The scope was withdrawn the patient tolerated the procedure well.     COMPLICATIONS: None  ENDOSCOPIC IMPRESSION: 1. Edematous friable mucosa of a very mild nature. Probably due to infectious colitis.  RECOMMENDATIONS: we'll place patient on a low-residue diet and follow him clinically. We'll wait for the results of the biopsy specimens.    _______________________________ Lorrin MaisLaurence Spates, MD 01/01/2012 4:53 PM

## 2012-01-02 ENCOUNTER — Encounter (HOSPITAL_COMMUNITY): Payer: Self-pay | Admitting: Gastroenterology

## 2012-01-02 DIAGNOSIS — A039 Shigellosis, unspecified: Secondary | ICD-10-CM

## 2012-01-02 LAB — CBC
HCT: 46.4 % (ref 39.0–52.0)
Hemoglobin: 15.7 g/dL (ref 13.0–17.0)
MCH: 28.5 pg (ref 26.0–34.0)
MCHC: 33.8 g/dL (ref 30.0–36.0)
MCV: 84.2 fL (ref 78.0–100.0)
Platelets: 140 10*3/uL — ABNORMAL LOW (ref 150–400)
RBC: 5.51 MIL/uL (ref 4.22–5.81)
RDW: 15.6 % — ABNORMAL HIGH (ref 11.5–15.5)
WBC: 12.1 10*3/uL — ABNORMAL HIGH (ref 4.0–10.5)

## 2012-01-02 LAB — BASIC METABOLIC PANEL
BUN: 41 mg/dL — ABNORMAL HIGH (ref 6–23)
CO2: 13 mEq/L — ABNORMAL LOW (ref 19–32)
Calcium: 9.1 mg/dL (ref 8.4–10.5)
Chloride: 109 mEq/L (ref 96–112)
Creatinine, Ser: 1.86 mg/dL — ABNORMAL HIGH (ref 0.50–1.35)
GFR calc Af Amer: 39 mL/min — ABNORMAL LOW (ref >90)
GFR calc non Af Amer: 34 mL/min — ABNORMAL LOW (ref >90)
Glucose, Bld: 213 mg/dL — ABNORMAL HIGH (ref 70–99)
Potassium: 4.2 mEq/L (ref 3.5–5.1)
Sodium: 137 mEq/L (ref 135–145)

## 2012-01-02 LAB — GLUCOSE, CAPILLARY
Glucose-Capillary: 206 mg/dL — ABNORMAL HIGH (ref 70–99)
Glucose-Capillary: 166 mg/dL — ABNORMAL HIGH (ref 70–99)
Glucose-Capillary: 142 mg/dL — ABNORMAL HIGH (ref 70–99)
Glucose-Capillary: 145 mg/dL — ABNORMAL HIGH (ref 70–99)
Glucose-Capillary: 156 mg/dL — ABNORMAL HIGH (ref 70–99)

## 2012-01-02 MED ORDER — DEXTROSE 5 % IV SOLN
1.0000 g | INTRAVENOUS | Status: DC
  Administered 2012-01-02 – 2012-01-03 (×2): 1 g via INTRAVENOUS
  Filled 2012-01-02 (×2): qty 10

## 2012-01-02 MED ORDER — CIPROFLOXACIN HCL 500 MG PO TABS
500.0000 mg | ORAL_TABLET | Freq: Two times a day (BID) | ORAL | Status: DC
  Administered 2012-01-02: 500 mg via ORAL
  Filled 2012-01-02 (×2): qty 1

## 2012-01-02 MED ORDER — CIPROFLOXACIN IN D5W 400 MG/200ML IV SOLN
400.0000 mg | Freq: Two times a day (BID) | INTRAVENOUS | Status: DC
  Filled 2012-01-02: qty 200

## 2012-01-02 NOTE — Progress Notes (Signed)
[facility] PROGRESS NOTE Subjective Patient states he feels okay still having some loose stools. Stool culture returned positive for Shigella. Patient remains on Cipro and Rocephin.  Objective: Vital signs in last 24 hours: Temp:  [97.5 F (36.4 C)-98.7 F (37.1 C)] 97.7 F (36.5 C) (12/11 0922) Pulse Rate:  [76-89] 89  (12/11 0922) Resp:  [11-47] 18  (12/11 0922) BP: (121-166)/(58-129) 129/58 mmHg (12/11 0922) SpO2:  [94 %-98 %] 96 % (12/11 0922) Weight:  [98.5 kg (217 lb 2.5 oz)] 98.5 kg (217 lb 2.5 oz) (12/10 1959) Last BM Date: 01/02/12  Intake/Output from previous day: 12/10 0701 - 12/11 0700 In: 1778.8 [P.O.:960; I.V.:818.8] Out: 8 [Urine:2; Stool:6] Intake/Output this shift: Total I/O In: 240 [P.O.:240] Out: 200 [Urine:200]   Lab Results:  [facility] 01/02/12 0640 01/01/12 0505 12/31/11 0510 12/30/11 1720  WBC 12.1* 10.9* 14.0* 17.6*  HGB 15.7 16.3 15.9 16.4  HCT 46.4 48.6 47.5 48.5  PLT 140* 135* 158 161   BMET  Basename 01/02/12 0640 01/01/12 0505 12/31/11 0510 12/30/11 1720  NA 137 139 138 138  K 4.2 3.9 3.8 4.3  CL 109 111 110 104  CO2 13* 17* 16* 17*  CREATININE 1.86* 2.05* 2.77* 3.29*   LFT  Basename 12/31/11 0510 12/30/11 1720  PROT 5.9* 6.7  AST 12 21  ALT 9 11  ALKPHOS 105 113  BILITOT 0.6 0.5  BILIDIR -- --  IBILI -- --   PT/INR No results found for this basename: LABPROT:3,INR:3 in the last 72 hours PANCREAS  Basename 12/30/11 1720  LIPASE 11         Studies/Results: No results found.  Medications: I have reviewed the patient's current medications.  Assessment/Plan: 1. Infectious enterocolitis with Shigella. At this point would continue patient on oral Cipro for 10 days and then some type of probiotic after the Cipro was finished. We'll be happy to see him back on an as-needed basis.   Damon [name],Damon [name] 01/02/2012, 12:31 PM

## 2012-01-02 NOTE — Progress Notes (Signed)
[facility] PROGRESS NOTE  Damon [name] [id number] DOB: [birth year] DOA: 12/30/2011 PCP: Arnette Norris, MD  Assessment/Plan: 1. Generalized weakness: secondary to dehydration and ongoing diarrhea. Will continue IVF's and supportive care. 2. Shigella gastroenteritis  and bright red blood in stool: Status post colonoscopy on 12/10 which was consistent with infectious etiology, stool studies today positive for Shigella. patient on Rocephin, and Flagyl DC'ed. discussed with GI/Dr. Oletta Lamas and he recommends antibiotics for 10 days.  3. Acute on chronic renal failure: secondary to dehydration, hypotension and continue use of ACE. Continue holding ACE; continue IVF's. Cr trending down, follow and recheck 4. DM: continue SSI and 70/30 (dose decrease from home dose due to CLD/NPO for colonoscopy). A1C 6.8 5. Hypertriglyceridemia: Resume gemfibrozil after creatinine < 2; follow heart healthy low fat diet 6. Metabolic acidosis: 2/2 diarrhea, dehydration and acute on chronic kidney disease. Continue bicarb repletion. Will continue ivf's and follow electrolytes trend. 7. DVT:SCD's   Code Status: Full Family Communication: daughter at bedside Disposition Plan: home when medically stable.   Consultants:  GI ([facility], Dr. Oletta Lamas)  Antibiotics:  Flagyl 12/8  HPI/Subjective: -Still with diarrhea-more than 4x so far today, some upper abdominal pain. denies nausea and vomiting.   Objective: Filed Vitals:   01/01/12 1837 01/01/12 1959 01/02/12 0436 01/02/12 0922  BP: 125/68 126/71 121/69 129/58  Pulse: 76 88 88 89  Temp: 98.7 F (37.1 C) 98.1 F (36.7 C) 97.5 F (36.4 C) 97.7 F (36.5 C)  TempSrc: Oral Oral Oral Oral  Resp: 22 22 20 18   Height:      Weight:  98.5 kg (217 lb 2.5 oz)    SpO2: 96% 96% 94% 96%    Intake/Output Summary (Last 24 hours) at 01/02/12 1142 Last data filed at 01/02/12 0830  Gross per 24 hour  Intake 2018.75 ml  Output    208 ml  Net 1810.75 ml   Filed  Weights   12/30/11 2126 12/31/11 2055 01/01/12 1959  Weight: 94.5 kg (208 lb 5.4 oz) 95.482 kg (210 lb 8 oz) 98.5 kg (217 lb 2.5 oz)    Exam:   General:  NAD alert and oriented x3  Cardiovascular: S1 and S2, no rubs or gallops  Respiratory: CTA bilaterally  Abdomen: soft, mild upper abdominal tenderness, no rebound, positive BS; slightly distended according to patient  Neuro: non focal  Data Reviewed: Basic Metabolic Panel:  Lab [id number] 01/01/12 0505 12/31/11 0510 12/30/11 1720  NA 137 139 138 138  K 4.2 3.9 3.8 4.3  CL 109 111 110 104  CO2 13* 17* 16* 17*  GLUCOSE 213* 93 69* 89  BUN 41* 39* 45* 44*  CREATININE 1.86* 2.05* 2.77* 3.29*  CALCIUM 9.1 9.3 9.4 10.5  MG -- -- -- --  PHOS -- -- -- --   Liver Function Tests:  Lab 12/31/11 0510 12/30/11 1720  AST 12 21  ALT 9 11  ALKPHOS 105 113  BILITOT 0.6 0.5  PROT 5.9* 6.7  ALBUMIN 2.7* 3.1*    Lab 12/30/11 1720  LIPASE 11  AMYLASE --   CBC:  Lab 01/02/12 0640 01/01/12 0505 12/31/11 0510 12/30/11 1720  WBC 12.1* 10.9* 14.0* 17.6*  NEUTROABS -- -- -- 13.2*  HGB 15.7 16.3 15.9 16.4  HCT 46.4 48.6 47.5 48.5  MCV 84.2 85.3 84.5 84.6  PLT 140* 135* 158 161   CBG:  Lab 01/02/12 0737 01/01/12 2254 01/01/12 1958 01/01/12 1813 01/01/12 1539  GLUCAP 206* 158* 155* 159* 147*  Recent Results (from the past 240 hour(s))  CLOSTRIDIUM DIFFICILE BY PCR     Status: Normal   Collection Time   12/30/11  9:13 PM      Component Value Range Status Comment   C difficile by pcr NEGATIVE  NEGATIVE Final   STOOL CULTURE     Status: Normal (Preliminary result)   Collection Time   12/31/11  3:16 PM      Component Value Range Status Comment   Specimen Description STOOL   Final    Special Requests NONE   Final    Culture Positive for Shiga toxin 2   Final    Report Status PENDING   Incomplete      Studies: No results found.  Scheduled Meds:    . cefTRIAXone (ROCEPHIN)  IV  1 g Intravenous Q24H  . cinacalcet   30 mg Oral Custom  . insulin aspart  0-15 Units Subcutaneous TID WC  . insulin aspart protamine-insulin aspart  20 Units Subcutaneous BID WC  . sodium bicarbonate  650 mg Oral BID  . [DISCONTINUED] metroNIDAZOLE  500 mg Oral Q8H   Continuous Infusions:    . sodium chloride 1,000 mL (01/02/12 0658)  . [DISCONTINUED] sodium chloride 1,000 mL (01/01/12 1228)  . [DISCONTINUED] sodium chloride    . [DISCONTINUED] sodium chloride      Time spent: < 30 minutes    [facility] Pager [phone]. If 8PM-8AM, please contact night-coverage at [email or web address], password [facility] 01/02/2012, 11:42 AM  LOS: 3 days

## 2012-01-02 NOTE — Progress Notes (Addendum)
ANTIBIOTIC CONSULT NOTE - INITIAL  Pharmacy Consult for Cipro Indication: Empiric abdminal coverage  Allergies  Allergen Reactions  . Penicillins Other (See Comments)    Reaction as a child    Patient Measurements: Height: 5\' 5"  (165.1 cm) Weight: 217 lb 2.5 oz (98.5 kg) IBW/kg (Calculated) : 61.5   Vital Signs: Temp: 97.7 F (36.5 C) (12/11 0922) Temp src: Oral (12/11 0922) BP: 129/58 mmHg (12/11 0922) Pulse Rate: 89  (12/11 0922) Intake/Output from previous day: 12/10 0701 - 12/11 0700 In: 1778.8 [P.O.:960; I.V.:818.8] Out: 8 [Urine:2; Stool:6] Intake/Output from this shift: Total I/O In: 240 [P.O.:240] Out: 200 [Urine:200]  Labs:  [facility] 01/02/12 0640 01/01/12 0505 12/31/11 0510  WBC 12.1* 10.9* 14.0*  HGB 15.7 16.3 15.9  PLT 140* 135* 158  LABCREA -- -- --  CREATININE 1.86* 2.05* 2.77*   Estimated Creatinine Clearance: 36.5 ml/min (by C-G formula based on Cr of 1.86). No results found for this basename: VANCOTROUGH:2,VANCOPEAK:2,VANCORANDOM:2,GENTTROUGH:2,GENTPEAK:2,GENTRANDOM:2,TOBRATROUGH:2,TOBRAPEAK:2,TOBRARND:2,AMIKACINPEAK:2,AMIKACINTROU:2,AMIKACIN:2, in the last 72 hours   Microbiology: Recent Results (from the past 720 hour(s))  CLOSTRIDIUM DIFFICILE BY PCR     Status: Normal   Collection Time   12/30/11  9:13 PM      Component Value Range Status Comment   C difficile by pcr NEGATIVE  NEGATIVE Final   STOOL CULTURE     Status: Normal (Preliminary result)   Collection Time   12/31/11  3:16 PM      Component Value Range Status Comment   Specimen Description STOOL   Final    Special Requests NONE   Final    Culture Positive for Shiga toxin 2   Final    Report Status PENDING   Incomplete     Medical History: Past Medical History  Diagnosis Date  . Bradycardia   . Diabetes mellitus   . Hypertension   . Chronic kidney disease     BAseline creatinine 1.4 to 1.5  . Arrhythmia     Baseline bradycardia  . Hard of hearing     Assessment: 78  y.o who presented to [facility] on 12/8 with diarrhea, abdominal pain, and recent episodes of BRBPR. Stool cultures show Shigella and pharmacy consulted to start Ciprofloxacin for Shigella enterocolitis. Per GI's note -- to continue treatment for 10 days. GI also recommended oral treatment -- so will start with oral Cipro this afternoon. Patient noted to have acute renal failure on admit likely pre-renal related to dehydration from diarrhea, now resolving. SCr 1.86, CrCl~35-40 ml/min.   Goal of Therapy:  Proper antibiotics for infection/cultures adjusted for renal/hepatic function   Plan:  1. Ciprofloxacin 500 mg po twice daily 2. Will continue to follow renal function, culture results, LOT, and antibiotic de-escalation plans   Alycia Rossetti, PharmD, BCPS Clinical Pharmacist Pager: [phone] 01/02/2012 1:12 PM

## 2012-01-02 NOTE — Progress Notes (Signed)
Lab notified me that pt's stool culture was positive for shiga toxin 2. MD notified.

## 2012-01-03 DIAGNOSIS — R7881 Bacteremia: Secondary | ICD-10-CM

## 2012-01-03 DIAGNOSIS — A498 Other bacterial infections of unspecified site: Secondary | ICD-10-CM

## 2012-01-03 LAB — BASIC METABOLIC PANEL
BUN: 37 mg/dL — ABNORMAL HIGH (ref 6–23)
CO2: 16 mEq/L — ABNORMAL LOW (ref 19–32)
Calcium: 9.3 mg/dL (ref 8.4–10.5)
Chloride: 109 mEq/L (ref 96–112)
Creatinine, Ser: 1.75 mg/dL — ABNORMAL HIGH (ref 0.50–1.35)
GFR calc Af Amer: 42 mL/min — ABNORMAL LOW (ref >90)
GFR calc non Af Amer: 36 mL/min — ABNORMAL LOW (ref >90)
Glucose, Bld: 160 mg/dL — ABNORMAL HIGH (ref 70–99)
Potassium: 3.8 mEq/L (ref 3.5–5.1)
Sodium: 136 mEq/L (ref 135–145)

## 2012-01-03 LAB — CBC
HCT: 44.5 % (ref 39.0–52.0)
Hemoglobin: 15.2 g/dL (ref 13.0–17.0)
MCH: 27.9 pg (ref 26.0–34.0)
MCHC: 34.2 g/dL (ref 30.0–36.0)
MCV: 81.8 fL (ref 78.0–100.0)
Platelets: 112 10*3/uL — ABNORMAL LOW (ref 150–400)
RBC: 5.44 MIL/uL (ref 4.22–5.81)
RDW: 15.2 % (ref 11.5–15.5)
WBC: 10.9 10*3/uL — ABNORMAL HIGH (ref 4.0–10.5)

## 2012-01-03 LAB — GLUCOSE, CAPILLARY
Glucose-Capillary: 161 mg/dL — ABNORMAL HIGH (ref 70–99)
Glucose-Capillary: 136 mg/dL — ABNORMAL HIGH (ref 70–99)
Glucose-Capillary: 155 mg/dL — ABNORMAL HIGH (ref 70–99)
Glucose-Capillary: 61 mg/dL — ABNORMAL LOW (ref 70–99)
Glucose-Capillary: 68 mg/dL — ABNORMAL LOW (ref 70–99)
Glucose-Capillary: 98 mg/dL (ref 70–99)

## 2012-01-03 MED ORDER — SODIUM CHLORIDE 0.9 % IV SOLN
1000.0000 mL | INTRAVENOUS | Status: DC
  Administered 2012-01-03 – 2012-01-05 (×4): 1000 mL via INTRAVENOUS

## 2012-01-03 NOTE — Progress Notes (Signed)
[facility] PROGRESS NOTE  ARBEN KARAGIANNIS [id number] DOB: Jun 16, 1933 DOA: 12/30/2011 PCP: Arnette Norris, MD  Assessment/Plan: 1. Generalized weakness: secondary to dehydration and ongoing diarrhea. Will continue IVF's and supportive care. 2. Shiga toxin  and bright red blood in stool: Status post colonoscopy on 12/10 which was consistent with infectious etiology, verbal report on 12/11 indicated Shigella-positive, but final report today 12/12 states shiga toxin positive but Shigella and Salmonella negative. patient on Rocephin started on 12/11 I consulted ID, discussed patient with Dr. Drucilla Schmidt and he states are other bacteria like Escherichia coli it produce Shiga toxin besides Shigella, he recommends discontinuing the Rocephin and he will see patient. It is noted though that patient did appear to improve overnight on the Rocephin 3. Acute on chronic renal failure: secondary to dehydration, hypotension and continue use of ACE. Continue holding ACE; continue IVF's. Cr trending down, follow and recheck 4. DM: continue SSI and 70/30 (dose decrease from home dose due to CLD/NPO for colonoscopy). A1C 6.8 5. Hypertriglyceridemia: Resume gemfibrozil after creatinine < 2; follow heart healthy low fat diet 6. Metabolic acidosis: 2/2 diarrhea, dehydration and acute on chronic kidney disease. Continue bicarb repletion. slowly improving, Will increase ivf's and follow electrolytes trend. 7. DVT:SCD's  8. Deconditioning: PT OT consult Code Status: Full Family Communication: daughter at bedside Disposition Plan: home when medically stable.   Consultants:  GI ([facility], Dr. Oletta Lamas)  Antibiotics:  Flagyl 12/8>>12/11  Rocephin 12/11>>12/12  HPI/Subjective: -States no diarrhea so far this a.m., son at bedside and states that by mouth intake this a.m. Per son patient lives with him and he works.   Objective: Filed Vitals:   01/02/12 2124 01/02/12 2141 01/03/12 0451 01/03/12 0916  BP: 125/68 104/35  119/55 123/33  Pulse: 80 78 80 91  Temp: 97.5 F (36.4 C) 97.3 F (36.3 C) 97.4 F (36.3 C) 98.1 F (36.7 C)  TempSrc: Oral Oral Oral   Resp: 17 18 20 19   Height:      Weight: 96.843 kg (213 lb 8 oz) 100.426 kg (221 lb 6.4 oz)    SpO2: 97% 100% 95% 98%    Intake/Output Summary (Last 24 hours) at 01/03/12 0952 Last data filed at 01/03/12 0916  Gross per 24 hour  Intake 1207.5 ml  Output      0 ml  Net 1207.5 ml   Filed Weights   01/01/12 1959 01/02/12 2124 01/02/12 2141  Weight: 98.5 kg (217 lb 2.5 oz) 96.843 kg (213 lb 8 oz) 100.426 kg (221 lb 6.4 oz)    Exam:   General:  NAD alert and oriented x3  Cardiovascular: S1 and S2, no rubs or gallops  Respiratory: CTA bilaterally  Abdomen: soft, nontender, no rebound, positive BS; slightly distended according to patient  Neuro: non focal  Data Reviewed: Basic Metabolic Panel:  Lab [id number] 0700 01/02/12 0640 01/01/12 0505 12/31/11 0510 12/30/11 1720  NA 136 137 139 138 138  K 3.8 4.2 3.9 3.8 4.3  CL 109 109 111 110 104  CO2 16* 13* 17* 16* 17*  GLUCOSE 160* 213* 93 69* 89  BUN 37* 41* 39* 45* 44*  CREATININE 1.75* 1.86* 2.05* 2.77* 3.29*  CALCIUM 9.3 9.1 9.3 9.4 10.5  MG -- -- -- -- --  PHOS -- -- -- -- --   Liver Function Tests:  Lab 12/31/11 0510 12/30/11 1720  AST 12 21  ALT 9 11  ALKPHOS 105 113  BILITOT 0.6 0.5  PROT 5.9* 6.7  ALBUMIN 2.7* 3.1*  Lab 12/30/11 1720  LIPASE 11  AMYLASE --   CBC:  Lab 01/03/12 0700 01/02/12 0640 01/01/12 0505 12/31/11 0510 12/30/11 1720  WBC 10.9* 12.1* 10.9* 14.0* 17.6*  NEUTROABS -- -- -- -- 13.2*  HGB 15.2 15.7 16.3 15.9 16.4  HCT 44.5 46.4 48.6 47.5 48.5  MCV 81.8 84.2 85.3 84.5 84.6  PLT 112* 140* 135* 158 161   CBG:  Lab 01/03/12 0726 01/02/12 2137 01/02/12 2114 01/02/12 1707 01/02/12 1205  GLUCAP 161* 156* 145* 142* 166*    Recent Results (from the past 240 hour(s))  CLOSTRIDIUM DIFFICILE BY PCR     Status: Normal   Collection Time   12/30/11   9:13 PM      Component Value Range Status Comment   C difficile by pcr NEGATIVE  NEGATIVE Final   STOOL CULTURE     Status: Normal (Preliminary result)   Collection Time   12/31/11  3:16 PM      Component Value Range Status Comment   Specimen Description STOOL   Final    Special Requests NONE   Final    Culture     Final    Value: Positive for Shiga toxin 2     NO SALMONELLA, SHIGELLA, CAMPYLOBACTER, OR YERSINIA ISOLATED     Note: CRITICAL RESULT CALLED TO, READ BACK BY AND VERIFIED WITH: CHRISTIE S@0747  ON [id number] BY [place] REFERRED TO [facility] IN [place] IDENTIFICATION/CONFIRMATION   Report Status PENDING   Incomplete      Studies: No results found.  Scheduled Meds:    . cefTRIAXone (ROCEPHIN)  IV  1 g Intravenous Q24H  . cinacalcet  30 mg Oral Custom  . insulin aspart  0-15 Units Subcutaneous TID WC  . insulin aspart protamine-insulin aspart  20 Units Subcutaneous BID WC  . sodium bicarbonate  650 mg Oral BID  . [DISCONTINUED] ciprofloxacin  400 mg Intravenous Q12H  . [DISCONTINUED] ciprofloxacin  500 mg Oral BID  . [DISCONTINUED] metroNIDAZOLE  500 mg Oral Q8H   Continuous Infusions:    . sodium chloride 1,000 mL (01/02/12 0658)    Time spent: 81minutes    [facility] Pager [phone]. If 8PM-8AM, please contact night-coverage at [email or web address], password [facility] 01/03/2012, 9:52 AM  LOS: 4 days

## 2012-01-03 NOTE — Consult Note (Signed)
[facility] for Infectious Disease  Total days of antibiotics 5  (All D/C'd today)               Reason for Consult:Shiga Toxin 2 found on stool culture.    Referring Physician: Dr. Dillard Essex  Active Problems:  Diabetes mellitus with kidney disease  DIABETIC PERIPHERAL NEUROPATHY  HYPERTRIGLYCERIDEMIA  OBESITY  HYPERTENSION  Bradycardia  Acute-on-chronic kidney injury  Dehydration  Shigella gastroenteritis    HPI: Damon [name] is a 78 y.o. male with a past medical history of diabetes, chronic kidney disease, HTN who presented to the ED on Saturday 12/29/11 with bloody diarrhea.  Patient developed severe diarrhea on Friday 12/28/11. He had some mild abdominal pain. Later that day he started having several episodes of bright red blood per rectum. They went to [facility] and waited in the ER for 5-6 hours. During the wait the patient's bleeding stopped. He had not been seen, he decided to go home. His diarrhea did continue but with no further bleeding. Patient states that he ate at [facility] on Thursday and had a steak.  He's never had a colonoscopy. He's not previously had blood in his stool. He denied weight loss, fever, chills, previous constipation, lightheadedness, or dizziness. On admission, he was given metronidazole in case of C.Diff, which was proven negative by PCR.  Patient continued to have bloody diarrhea during admission. GI performed a colonoscopy on 01/01/12 which showed inflammatory changes consistent with infectious colitis. Patient was started on ciprofloxacin on 01/02/12.  Primary stool cultures came back as shigella so patient was switched to ceftriaxone.  Final stool cultures today show shigella to be negative, but positive for shigella toxin 2.  Patient denies diarrhea today and states he has no pain or complaints.  Past Medical History  Diagnosis Date  . Bradycardia   . Diabetes mellitus   . Hypertension   . Chronic kidney disease     BAseline  creatinine 1.4 to 1.5  . Arrhythmia     Baseline bradycardia  . Hard of hearing     Allergies:  Allergies  Allergen Reactions  . Penicillins Other (See Comments)    Reaction as a child    Current antibiotics: None  MEDICATIONS:    . cinacalcet  30 mg Oral Custom  . insulin aspart  0-15 Units Subcutaneous TID WC  . insulin aspart protamine-insulin aspart  20 Units Subcutaneous BID WC  . sodium bicarbonate  650 mg Oral BID    History  Substance Use Topics  . Smoking status: Never Smoker   . Smokeless tobacco: Current User    Types: Chew     Comment: chewing more than 40 years/chew 1PPD  . Alcohol Use: No    Family History  Problem Relation Age of Onset  . Diabetes type II Mother   . Hypertension Mother   . Diabetes type II Father   . Hypertension Father     Review of Systems - General ROS: negative Respiratory ROS: no cough, shortness of breath, or wheezing Cardiovascular ROS: no chest pain or dyspnea on exertion Gastrointestinal ROS: positive for - diarrhea, but denies diarrhea today Genito-Urinary ROS: no dysuria, trouble voiding, or hematuria Musculoskeletal ROS: negative Neurological ROS: no TIA or stroke symptoms Dermatological ROS: negative   OBJECTIVE: Temp:  [97.3 F (36.3 C)-98.1 F (36.7 C)] 98.1 F (36.7 C) (12/12 1313) Pulse Rate:  [73-93] 93  (12/12 1313) Resp:  [17-20] 20  (12/12 1313) BP: (99-125)/(33-68) 99/40  mmHg (12/12 1313) SpO2:  [95 %-100 %] 97 % (12/12 1313) Weight:  [96.843 kg (213 lb 8 oz)-100.426 kg (221 lb 6.4 oz)] 100.426 kg (221 lb 6.4 oz) (12/11 2141) General appearance: alert, cooperative and no distress Head: Normocephalic, without obvious abnormality, atraumatic Resp: clear to auscultation bilaterally Cardio: regular rate and rhythm, S1, S2 normal, no murmur, click, rub or gallop GI: soft, non-tender; bowel sounds normal; no masses,  no organomegaly Extremities: extremities normal, atraumatic, no cyanosis or  edema Neurologic: Grossly normal  LABS: Results for orders placed during the hospital encounter of 12/30/11 (from the past 48 hour(s))  GLUCOSE, CAPILLARY     Status: Abnormal   Collection Time   01/01/12  6:13 PM      Component Value Range Comment   Glucose-Capillary 159 (*) 70 - 99 mg/dL   GLUCOSE, CAPILLARY     Status: Abnormal   Collection Time   01/01/12  7:58 PM      Component Value Range Comment   Glucose-Capillary 155 (*) 70 - 99 mg/dL   GLUCOSE, CAPILLARY     Status: Abnormal   Collection Time   01/01/12 10:54 PM      Component Value Range Comment   Glucose-Capillary 158 (*) 70 - 99 mg/dL   CBC     Status: Abnormal   Collection Time   01/02/12  6:40 AM      Component Value Range Comment   WBC 12.1 (*) 4.0 - 10.5 K/uL    RBC 5.51  4.22 - 5.81 MIL/uL    Hemoglobin 15.7  13.0 - 17.0 g/dL    HCT 46.4  39.0 - 52.0 %    MCV 84.2  78.0 - 100.0 fL    MCH 28.5  26.0 - 34.0 pg    MCHC 33.8  30.0 - 36.0 g/dL    RDW 15.6 (*) 11.5 - 15.5 %    Platelets 140 (*) 150 - 400 K/uL   BASIC METABOLIC PANEL     Status: Abnormal   Collection Time   01/02/12  6:40 AM      Component Value Range Comment   Sodium 137  135 - 145 mEq/L    Potassium 4.2  3.5 - 5.1 mEq/L    Chloride 109  96 - 112 mEq/L    CO2 13 (*) 19 - 32 mEq/L    Glucose, Bld 213 (*) 70 - 99 mg/dL    BUN 41 (*) 6 - 23 mg/dL    Creatinine, Ser 1.86 (*) 0.50 - 1.35 mg/dL    Calcium 9.1  8.4 - 10.5 mg/dL    GFR calc non Af Amer 34 (*) >90 mL/min    GFR calc Af Amer 39 (*) >90 mL/min   GLUCOSE, CAPILLARY     Status: Abnormal   Collection Time   01/02/12  7:37 AM      Component Value Range Comment   Glucose-Capillary 206 (*) 70 - 99 mg/dL   GLUCOSE, CAPILLARY     Status: Abnormal   Collection Time   01/02/12 12:05 PM      Component Value Range Comment   Glucose-Capillary 166 (*) 70 - 99 mg/dL   GLUCOSE, CAPILLARY     Status: Abnormal   Collection Time   01/02/12  5:07 PM      Component Value Range Comment    Glucose-Capillary 142 (*) 70 - 99 mg/dL   GLUCOSE, CAPILLARY     Status: Abnormal   Collection Time   01/02/12  9:14 PM      Component Value Range Comment   Glucose-Capillary 145 (*) 70 - 99 mg/dL   GLUCOSE, CAPILLARY     Status: Abnormal   Collection Time   01/02/12  9:37 PM      Component Value Range Comment   Glucose-Capillary 156 (*) 70 - 99 mg/dL   CBC     Status: Abnormal   Collection Time   01/03/12  7:00 AM      Component Value Range Comment   WBC 10.9 (*) 4.0 - 10.5 K/uL    RBC 5.44  4.22 - 5.81 MIL/uL    Hemoglobin 15.2  13.0 - 17.0 g/dL    HCT 44.5  39.0 - 52.0 %    MCV 81.8  78.0 - 100.0 fL    MCH 27.9  26.0 - 34.0 pg    MCHC 34.2  30.0 - 36.0 g/dL    RDW 15.2  11.5 - 15.5 %    Platelets 112 (*) 150 - 400 K/uL PLATELET COUNT CONFIRMED BY SMEAR  BASIC METABOLIC PANEL     Status: Abnormal   Collection Time   01/03/12  7:00 AM      Component Value Range Comment   Sodium 136  135 - 145 mEq/L    Potassium 3.8  3.5 - 5.1 mEq/L    Chloride 109  96 - 112 mEq/L    CO2 16 (*) 19 - 32 mEq/L    Glucose, Bld 160 (*) 70 - 99 mg/dL    BUN 37 (*) 6 - 23 mg/dL    Creatinine, Ser 1.75 (*) 0.50 - 1.35 mg/dL    Calcium 9.3  8.4 - 10.5 mg/dL    GFR calc non Af Amer 36 (*) >90 mL/min    GFR calc Af Amer 42 (*) >90 mL/min   GLUCOSE, CAPILLARY     Status: Abnormal   Collection Time   01/03/12  7:26 AM      Component Value Range Comment   Glucose-Capillary 161 (*) 70 - 99 mg/dL   GLUCOSE, CAPILLARY     Status: Abnormal   Collection Time   01/03/12 11:33 AM      Component Value Range Comment   Glucose-Capillary 136 (*) 70 - 99 mg/dL     MICRO: Stool Culture 12/31/11: Shiga toxin 2  C.Diff 12/30/11: Negative  IMAGING: No results found.  HISTORICAL MICRO/IMAGING  Assessment/Plan: Damon [name] is a 78 y.o. Male with a past medical history of diabetes, chronic kidney disease, HTN who presented to the ED on Saturday 12/29/11 with bloody diarrhea.  1) Shiga toxin in stool  culture: This toxin can be made by certain strands of E.Coli  Including EHEC O157:H7.  This is likely the cause of his diarrhea and BRBPR.  This diagnosis does not require antibiotics.  Antibiotics will cause further release of the toxin and worsen the symptoms.  Patient should be treated symptomatically and watched carefully.

## 2012-01-03 NOTE — Consult Note (Signed)
INFECTIOUS DISEASE ATTENDING ADDENDUM:     [facility] for Infectious Disease   Date: 01/03/2012  Patient name: [name]  Medical record number: [id number]  Date of birth: 06-Jul-1933    This patient has been seen and discussed with the house staff. Please see their note for complete details. I concur with their findings with the following additions/corrections:  Patient with apparent ENTERO HEMORRHAGIC E COLI, LIKELY E COLI 0157:H7 with Shiga toxin production type 2 found on assay.  ANTIBIOTICS SHOULD NOT BE USED WHEN SHIGA TOXIN PRODUCING E COLI SUCH AS 0157:H7 ARE SUSPECTED OR DIAGNOSED AS ANTIBIOTICS CAN INCREASE RISK OF HEMOLYTIC UREMIC SYNDROME  MECHANISM IS THOUGHT TO BE DUE TO  INCREASED RELEASE OF SHIGA TOXIN WITH  DEATH OF BACTERIA   I WILL STOP ALL ANTIBIOTICS   ALSO STOP ANTI-MOTILITY AGENTS  CULTURE HAS BEEN SENT TO [facility].  I will let Health Dept know of this case.  EHEC organisms commonly found in meats, salads. Pt recently ate at a local steak restaurant last THursday. He could not give me any other details about recent dinners food purchased.  He should require ONLY supportive care and should continue to improve.  AGAIN DO NOT REINSTATE ANTIBIOTICS IN THIS MAN  Please call with further questions.  Rhina Brackett [place] 01/03/2012, 6:30 PM

## 2012-01-04 ENCOUNTER — Inpatient Hospital Stay (HOSPITAL_COMMUNITY): Payer: Medicare Other

## 2012-01-04 DIAGNOSIS — A498 Other bacterial infections of unspecified site: Secondary | ICD-10-CM | POA: Diagnosis present

## 2012-01-04 DIAGNOSIS — E669 Obesity, unspecified: Secondary | ICD-10-CM

## 2012-01-04 LAB — BASIC METABOLIC PANEL
BUN: 28 mg/dL — ABNORMAL HIGH (ref 6–23)
CO2: 20 mEq/L (ref 19–32)
Calcium: 9.3 mg/dL (ref 8.4–10.5)
Chloride: 112 mEq/L (ref 96–112)
Creatinine, Ser: 1.65 mg/dL — ABNORMAL HIGH (ref 0.50–1.35)
GFR calc Af Amer: 45 mL/min — ABNORMAL LOW (ref >90)
GFR calc non Af Amer: 39 mL/min — ABNORMAL LOW (ref >90)
Glucose, Bld: 124 mg/dL — ABNORMAL HIGH (ref 70–99)
Potassium: 3.6 mEq/L (ref 3.5–5.1)
Sodium: 140 mEq/L (ref 135–145)

## 2012-01-04 LAB — CBC
HCT: 43.1 % (ref 39.0–52.0)
Hemoglobin: 14.6 g/dL (ref 13.0–17.0)
MCH: 28 pg (ref 26.0–34.0)
MCHC: 33.9 g/dL (ref 30.0–36.0)
MCV: 82.7 fL (ref 78.0–100.0)
Platelets: 127 10*3/uL — ABNORMAL LOW (ref 150–400)
RBC: 5.21 MIL/uL (ref 4.22–5.81)
RDW: 15.6 % — ABNORMAL HIGH (ref 11.5–15.5)
WBC: 10.2 10*3/uL (ref 4.0–10.5)

## 2012-01-04 LAB — GLUCOSE, CAPILLARY
Glucose-Capillary: 112 mg/dL — ABNORMAL HIGH (ref 70–99)
Glucose-Capillary: 164 mg/dL — ABNORMAL HIGH (ref 70–99)
Glucose-Capillary: 183 mg/dL — ABNORMAL HIGH (ref 70–99)
Glucose-Capillary: 55 mg/dL — ABNORMAL LOW (ref 70–99)
Glucose-Capillary: 170 mg/dL — ABNORMAL HIGH (ref 70–99)
Glucose-Capillary: 47 mg/dL — ABNORMAL LOW (ref 70–99)

## 2012-01-04 MED ORDER — DEXTROSE 50 % IV SOLN
INTRAVENOUS | Status: AC
  Administered 2012-01-04: 25 mL
  Filled 2012-01-04: qty 50

## 2012-01-04 MED ORDER — ERYTHROMYCIN 5 MG/GM OP OINT
TOPICAL_OINTMENT | Freq: Four times a day (QID) | OPHTHALMIC | Status: DC
  Administered 2012-01-04 – 2012-01-09 (×17): via OPHTHALMIC
  Administered 2012-01-09: 1 via OPHTHALMIC
  Administered 2012-01-10 (×2): via OPHTHALMIC
  Administered 2012-01-10: 1 via OPHTHALMIC
  Administered 2012-01-11 – 2012-01-12 (×6): via OPHTHALMIC
  Administered 2012-01-12: 1 via OPHTHALMIC
  Administered 2012-01-12 – 2012-01-14 (×8): via OPHTHALMIC
  Administered 2012-01-15: 1 via OPHTHALMIC
  Administered 2012-01-15 (×2): via OPHTHALMIC
  Filled 2012-01-04 (×3): qty 3.5

## 2012-01-04 NOTE — Progress Notes (Signed)
OT Cancellation Note  Patient Details Name: Damon [name] MRN: [id number] DOB: 15-Nov-1933   Cancelled Treatment:    Reason Eval/Treat Not Completed: Patient at procedure or test/ unavailable.  Pt arriving back from xray upon OT arrival and very lethargic.  Spoke briefly with daughter and she reports pt will not have 24/7 assist for first week or two at home. Will re-attempt next date.  01/04/2012 Darrol Jump OTR/L Pager [phone] Office [phone]

## 2012-01-04 NOTE — Progress Notes (Signed)
Hypoglycemic Event  CBG: Results for Damon [name], Damon [name] (MRN [id number]) as of 01/04/2012 00:18  Ref. Range 01/03/2012 22:13  Glucose-Capillary Latest Range: 70-99 mg/dL 68 (L)    Treatment: 15 gm cho  Symptoms: None  Follow-up CBG: Time: CBG Result: Results for Damon [name], Damon [name] (MRN [id number]) as of 01/04/2012 00:18  Ref. Range 01/03/2012 22:37  Glucose-Capillary Latest Range: 70-99 mg/dL 98    Possible Reasons for Event: Unknown  Comments/MD notified:    Viviano Simas  Remember to initiate Hypoglycemia Order Set & complete

## 2012-01-04 NOTE — Progress Notes (Addendum)
[facility] PROGRESS NOTE  Damon [name] [id number] DOB: 02/02/1933 DOA: 12/30/2011 PCP: Arnette Norris, MD  Assessment/Plan: 1. Generalized weakness: secondary to dehydration and ongoing diarrhea. Will continue IVF's and supportive care. 2. Shiga toxin  and bright red blood in stool: Status post colonoscopy on 12/10 which was consistent with infectious etiology, verbal report on 12/11 indicated Shigella-positive, but final report today 12/12 states shiga toxin positive but Shigella and Salmonella negative. patient on Rocephin started on 12/11 I consulted ID, discussed patient with Dr. Drucilla Schmidt and he states are other bacteria like Escherichia coli it produce Shiga toxin besides Shigella, he recommends discontinuing the Rocephin and he will see patient. It is noted though that patient did appear to improve overnight on the Rocephin he received x1 day. We'll continue to observe off antibiotics, appreciate ID assistance. 3. Acute on chronic renal failure: secondary to dehydration, hypotension and continue use of ACE. Continue holding ACE; continue IVF's. Cr trending down, follow and recheck 4. DM: continue SSI and 70/30 (dose decrease from home dose due to CLD/NPO for colonoscopy). A1C 6.8 5. Hypertriglyceridemia: Resume gemfibrozil after creatinine < 2; follow heart healthy low fat diet 6. Metabolic acidosis: 2/2 diarrhea, dehydration and acute on chronic kidney disease. Continue bicarb repletion. slowly improving, Will increase ivf's and follow electrolytes trend. 7. DVT:SCD's  8. Deconditioning: PT OT recommending skilled nursing 9. Increased abdominal girth-obtain x-ray, if negative get ultrasound to evaluate for ascites. Code Status: Full Family Communication: daughter at bedside Disposition Plan: SNF when medically stable   Consultants:  GI ([facility], Dr. Oletta Lamas)  Antibiotics:  Flagyl 12/8>>12/11  Rocephin 12/11>>12/12  HPI/Subjective: -Denies any new complaints, no diarrhea this  a.m., tolerating by mouth well. Family reports increased abdominal girth Objective: Filed Vitals:   01/03/12 1733 01/03/12 2208 01/04/12 0438 01/04/12 1000  BP: 110/54 113/45 121/67 123/62  Pulse: 70 74 73 82  Temp: 98 F (36.7 C) 97.4 F (36.3 C) 97.6 F (36.4 C) 98 F (36.7 C)  TempSrc:  Oral Oral Oral  Resp: 18 20 18 18   Height:      Weight:  98.884 kg (218 lb)    SpO2: 100% 97% 94% 93%    Intake/Output Summary (Last 24 hours) at 01/04/12 1359 Last data filed at 01/04/12 0900  Gross per 24 hour  Intake   1985 ml  Output      0 ml  Net   1985 ml   Filed Weights   01/02/12 2124 01/02/12 2141 01/03/12 2208  Weight: 96.843 kg (213 lb 8 oz) 100.426 kg (221 lb 6.4 oz) 98.884 kg (218 lb)    Exam:   General:  NAD alert and oriented x3  Cardiovascular: S1 and S2, no rubs or gallops  Respiratory: CTA bilaterally  Abdomen: Obese, soft, nontender, no rebound, positive BS; slightly distended according to patient  Neuro: non focal  Data Reviewed: Basic Metabolic Panel:  Lab [id number] 0700 01/03/12 0700 01/02/12 0640 01/01/12 0505 12/31/11 0510  NA 140 136 137 139 138  K 3.6 3.8 4.2 3.9 3.8  CL 112 109 109 111 110  CO2 20 16* 13* 17* 16*  GLUCOSE 124* 160* 213* 93 69*  BUN 28* 37* 41* 39* 45*  CREATININE 1.65* 1.75* 1.86* 2.05* 2.77*  CALCIUM 9.3 9.3 9.1 9.3 9.4  MG -- -- -- -- --  PHOS -- -- -- -- --   Liver Function Tests:  Lab 12/31/11 0510 12/30/11 1720  AST 12 21  ALT 9 11  ALKPHOS 105 113  BILITOT 0.6 0.5  PROT 5.9* 6.7  ALBUMIN 2.7* 3.1*    Lab 12/30/11 1720  LIPASE 11  AMYLASE --   CBC:  Lab 01/04/12 0700 01/03/12 0700 01/02/12 0640 01/01/12 0505 12/31/11 0510 12/30/11 1720  WBC 10.2 10.9* 12.1* 10.9* 14.0* --  NEUTROABS -- -- -- -- -- 13.2*  HGB 14.6 15.2 15.7 16.3 15.9 --  HCT 43.1 44.5 46.4 48.6 47.5 --  MCV 82.7 81.8 84.2 85.3 84.5 --  PLT 127* 112* 140* 135* 158 --   CBG:  Lab 01/04/12 0800 01/03/12 2237 01/03/12 2213 01/03/12 2211  01/03/12 1707  GLUCAP 112* 98 68* 61* 155*    Recent Results (from the past 240 hour(s))  CLOSTRIDIUM DIFFICILE BY PCR     Status: Normal   Collection Time   12/30/11  9:13 PM      Component Value Range Status Comment   C difficile by pcr NEGATIVE  NEGATIVE Final   STOOL CULTURE     Status: Normal (Preliminary result)   Collection Time   12/31/11  3:16 PM      Component Value Range Status Comment   Specimen Description STOOL   Final    Special Requests NONE   Final    Culture     Final    Value: Positive for Shiga toxin 2     NO SALMONELLA, SHIGELLA, CAMPYLOBACTER, OR YERSINIA ISOLATED     Note: CRITICAL RESULT CALLED TO, READ BACK BY AND VERIFIED WITH: CHRISTIE S@0747  ON [id number] BY [place] REFERRED TO [facility] IN [place] IDENTIFICATION/CONFIRMATION   Report Status PENDING   Incomplete      Studies: No results found.  Scheduled Meds:    . cinacalcet  30 mg Oral Custom  . erythromycin   Left Eye Q6H  . insulin aspart  0-15 Units Subcutaneous TID WC  . insulin aspart protamine-insulin aspart  20 Units Subcutaneous BID WC  . sodium bicarbonate  650 mg Oral BID   Continuous Infusions:    . sodium chloride 1,000 mL (01/04/12 0054)    Time spent: 78minutes    [facility] Pager [phone]. If 8PM-8AM, please contact night-coverage at [email or web address], password [facility] 01/04/2012, 1:59 PM  LOS: 5 days

## 2012-01-04 NOTE — Evaluation (Signed)
Physical Therapy Evaluation Patient Details Name: Damon [name] MRN: [id number] DOB: [birth year] Today's Date: 01/04/2012 Time: [id number] PT Time Calculation (min): 33 min  PT Assessment / Plan / Recommendation Clinical Impression  Pt. was admitted with history ofDM, CKD, HTN, arrhythmia and HOH.  Presented toED with bloody diarrhea and found to have + shig toxin 2 and generalized weakness.  He has decreased level of functional mobility and gait due to this acute illness and needs acute PT to address.  Discussed recommendation of 24 hour assist with pt. and his son.  Son currently works daytime and pt. is not able to be alone right now.      PT Assessment  Patient needs continued PT services    Follow Up Recommendations  SNF;Supervision/Assistance - 24 hour;Supervision for mobility/OOB    Does the patient have the potential to tolerate intense rehabilitation      Barriers to Discharge Decreased caregiver support son currently works for a company that will shut down for the "winter" (per  son) after next week.      Equipment Recommendations  Rolling walker with 5" wheels;Other (comment) (3n1)    Recommendations for Other Services Rehab consult   Frequency Min 3X/week    Precautions / Restrictions Precautions Precautions: Fall Restrictions Weight Bearing Restrictions: No   Pertinent Vitals/Pain Dyspnea 2/4 with ambulation      Mobility  Bed Mobility Bed Mobility: Not assessed (pt. seated at EOB) Transfers Transfers: Sit to Stand;Stand to Sit Sit to Stand: 3: Mod assist;With upper extremity assist;From toilet;From chair/3-in-1 Stand to Sit: 3: Mod assist;With upper extremity assist;With armrests;To chair/3-in-1 Details for Transfer Assistance: Pt. needed to rock to build momentum to stand.  Son says he sometimes has to do this at home., but that he can eventually stand on his own power..  Today, needed mod assist to initiate standing. Ambulation/Gait Ambulation/Gait  Assistance: 4: Min assist Ambulation Distance (Feet): 100 Feet Assistive device: Rolling walker;None Ambulation/Gait Assistance Details: Pt.'s initial response to RW was "I don't want that damn thing", so he walked with mod. hand hold assist from therapist 10'.  Pt. had difficulty advancing LEs and shifting his weight to unload conatralateral LE.  He then agreed to try RW and still needed min assist and vc's for posture and safety/stability. Overall was safer with RW and he was in agreement. Gait Pattern: Step-through pattern;Decreased step length - right;Decreased step length - left;Trunk flexed Stairs: No Wheelchair Mobility Wheelchair Mobility: No    Shoulder Instructions     Exercises     PT Diagnosis: Difficulty walking;Generalized weakness  PT Problem List: Decreased activity tolerance;Decreased balance;Decreased mobility;Decreased knowledge of use of DME;Decreased knowledge of precautions;Obesity PT Treatment Interventions: DME instruction;Gait training;Functional mobility training;Therapeutic activities;Therapeutic exercise;Balance training;Patient/family education   PT Goals Acute Rehab PT Goals PT Goal Formulation: With patient Time For Goal Achievement: 01/11/12 Potential to Achieve Goals: Good Pt will go Supine/Side to Sit: with supervision PT Goal: Supine/Side to Sit - Progress: Goal set today Pt will go Sit to Supine/Side: with supervision PT Goal: Sit to Supine/Side - Progress: Goal set today Pt will go Sit to Stand: with supervision PT Goal: Sit to Stand - Progress: Goal set today Pt will go Stand to Sit: with supervision PT Goal: Stand to Sit - Progress: Goal set today Pt will Transfer Bed to Chair/Chair to Bed: with supervision PT Transfer Goal: Bed to Chair/Chair to Bed - Progress: Goal set today Pt will Ambulate: >150 feet;with least restrictive assistive device PT Goal: Ambulate -  Progress: Goal set today  Visit Information  Last PT Received On:  01/04/12 Assistance Needed: +1    Subjective Data  Subjective: "I'm feeling OK" Patient Stated Goal: get the wood in   Prior [place] Lives With: Son Available Help at Discharge: Available PRN/intermittently;Other (Comment) (son works daytime) Type of Home: House Home Access: Level entry Home Layout: One level Bathroom Shower/Tub: Product/process development scientist: Standard Bathroom Accessibility: No Home Adaptive Equipment: Environmental consultant - four wheeled Prior Function Level of Independence: Independent Able to Take Stairs?: No Driving: Yes (but not in last 2 weeks) Vocation: Retired Corporate investment banker: No difficulties Dominant Hand: Right    Cognition  Overall Cognitive Status: Appears within functional limits for tasks assessed/performed Arousal/Alertness: Awake/alert Orientation Level: Time;Disoriented to Behavior During Session: [facility] for tasks performed    Extremity/Trunk Assessment Right Upper Extremity Assessment RUE ROM/Strength/Tone: WFL for tasks assessed RUE Sensation: WFL - Light Touch Left Upper Extremity Assessment LUE ROM/Strength/Tone: WFL for tasks assessed LUE Sensation: WFL - Light Touch Right Lower Extremity Assessment RLE ROM/Strength/Tone: Within functional levels RLE Sensation: History of peripheral neuropathy Left Lower Extremity Assessment LLE ROM/Strength/Tone: Within functional levels LLE Sensation: History of peripheral neuropathy Trunk Assessment Trunk Assessment: Kyphotic   Balance Balance Balance Assessed: No  End of Session PT - End of Session Equipment Utilized During Treatment: Gait belt Activity Tolerance: Patient tolerated treatment well;Patient limited by fatigue;Other (comment) (limited by generalized weakness) Patient left: in chair;with call bell/phone within reach;with family/visitor present Nurse Communication: Mobility status  GP     Ladona Ridgel 01/04/2012, 11:39 AM Gerlean Ren  PT [facility] [place] [phone]

## 2012-01-05 LAB — BASIC METABOLIC PANEL
BUN: 22 mg/dL (ref 6–23)
CO2: 21 mEq/L (ref 19–32)
Calcium: 9.5 mg/dL (ref 8.4–10.5)
Chloride: 115 mEq/L — ABNORMAL HIGH (ref 96–112)
Creatinine, Ser: 1.53 mg/dL — ABNORMAL HIGH (ref 0.50–1.35)
GFR calc Af Amer: 49 mL/min — ABNORMAL LOW (ref >90)
GFR calc non Af Amer: 42 mL/min — ABNORMAL LOW (ref >90)
Glucose, Bld: 188 mg/dL — ABNORMAL HIGH (ref 70–99)
Potassium: 4.3 mEq/L (ref 3.5–5.1)
Sodium: 143 mEq/L (ref 135–145)

## 2012-01-05 LAB — GLUCOSE, CAPILLARY
Glucose-Capillary: 142 mg/dL — ABNORMAL HIGH (ref 70–99)
Glucose-Capillary: 169 mg/dL — ABNORMAL HIGH (ref 70–99)
Glucose-Capillary: 169 mg/dL — ABNORMAL HIGH (ref 70–99)
Glucose-Capillary: 163 mg/dL — ABNORMAL HIGH (ref 70–99)
Glucose-Capillary: 92 mg/dL (ref 70–99)
Glucose-Capillary: 191 mg/dL — ABNORMAL HIGH (ref 70–99)
Glucose-Capillary: 87 mg/dL (ref 70–99)

## 2012-01-05 LAB — MAGNESIUM: Magnesium: 1.9 mg/dL (ref 1.5–2.5)

## 2012-01-05 LAB — TROPONIN I: Troponin I: <0.3 ng/mL (ref <0.30)

## 2012-01-05 MED ORDER — LORAZEPAM 2 MG/ML IJ SOLN
1.0000 mg | Freq: Once | INTRAMUSCULAR | Status: AC
  Administered 2012-01-05: 1 mg via INTRAVENOUS
  Filled 2012-01-05: qty 1

## 2012-01-05 MED ORDER — SODIUM CHLORIDE 0.9 % IV SOLN
1000.0000 mL | INTRAVENOUS | Status: DC
  Administered 2012-01-06: 1000 mL via INTRAVENOUS

## 2012-01-05 MED ORDER — DEXTROSE 50 % IV SOLN
25.0000 mL | Freq: Once | INTRAVENOUS | Status: AC | PRN
  Administered 2012-01-06: 25 mL via INTRAVENOUS

## 2012-01-05 NOTE — Progress Notes (Signed)
Hypoglycemic Event CBG: 47  Treatment: D50 IV 25 mL  Symptoms: None  Follow-up CBG: Time:2317 CBG Result:170  Possible Reasons for Event: Unknown  Comments/MD notified:No    Damon [name], Thea Gist  Remember to initiate Hypoglycemia Order Set & complete

## 2012-01-05 NOTE — Progress Notes (Signed)
Patient had not urinated all night.  He felt urge earlier in the evening but was unable to urinate.  We tried again and he was unable to urinate sitting down.  Patient stood by sink and held on while NT supported patient and held urinal.  Placed his hand under running water.  Before attempting the last urination, the bladder scan showed approximately 600 cc in the bladder.  Patient was able to urinate 475 cc.  Will continue to monitor patient.  Anikin Prosser, Thea Gist

## 2012-01-05 NOTE — Progress Notes (Signed)
Pt has been having audible wheezes for the last half hour, this is new for the pt. VSS. No SOB. No difficulty breathing. MD notified.

## 2012-01-05 NOTE — Evaluation (Signed)
Occupational Therapy Evaluation Patient Details Name: Damon [name] MRN: [id number] DOB: 05-24-1933 Today's Date: 01/05/2012 Time: 1527-1600 OT Time Calculation (min): 33 min  OT Assessment / Plan / Recommendation Clinical Impression  Pt admitted with bloody diarrhea and found to have + shig toxin 2 and generalized weakness.  Will benefit from OT services to address below problem list. Reommending SNF to further progress rehab before d/c home.    OT Assessment  Patient needs continued OT Services    Follow Up Recommendations  SNF    Barriers to Discharge Decreased caregiver support Family unable to provide 24/7 assist  Equipment Recommendations  3 in 1 bedside comode    Recommendations for Other Services    Frequency  Min 2X/week    Precautions / Restrictions Precautions Precautions: Fall Restrictions Weight Bearing Restrictions: No   Pertinent Vitals/Pain See vitals    ADL  Eating/Feeding: Performed;Independent Where Assessed - Eating/Feeding: Chair Grooming: Performed;Wash/dry hands;Wash/dry face;Set up Where Assessed - Grooming: Supported sitting Lower Body Dressing: Performed;Supervision/safety Where Assessed - Lower Body Dressing: Unsupported sitting Toilet Transfer: Simulated;Maximal assistance Toilet Transfer Method: Sit to stand Toilet Transfer Equipment: Other (comment) (chair) Equipment Used: Gait belt;Rolling walker Transfers/Ambulation Related to ADLs: Pt performed sit<>stand from chair x2  with max assist. ADL Comments: Pt able to cross ankles over knees to don/doff bil socks.  Pt requires assist to maintain standing balance, therefore affecting independence with ADLs at standing level.    OT Diagnosis: Generalized weakness  OT Problem List: Decreased strength;Decreased activity tolerance;Impaired balance (sitting and/or standing);Decreased knowledge of use of DME or AE OT Treatment Interventions: Self-care/ADL training;DME and/or AE  instruction;Therapeutic activities;Patient/family education;Balance training   OT Goals Acute Rehab OT Goals OT Goal Formulation: With patient Time For Goal Achievement: 01/19/12 Potential to Achieve Goals: Good ADL Goals Pt Will Perform Grooming: with modified independence;Standing at sink;Unsupported ADL Goal: Grooming - Progress: Goal set today Pt Will Perform Upper Body Bathing: with modified independence;Sitting, chair;Sitting, edge of bed;Unsupported ADL Goal: Upper Body Bathing - Progress: Goal set today Pt Will Perform Lower Body Bathing: with modified independence;Sit to stand from bed;Sit to stand from chair;Unsupported ADL Goal: Lower Body Bathing - Progress: Goal set today Pt Will Perform Upper Body Dressing: with modified independence;Sitting, chair;Sitting, bed;Unsupported ADL Goal: Upper Body Dressing - Progress: Goal set today Pt Will Perform Lower Body Dressing: with modified independence;Sit to stand from chair;Sit to stand from bed;Unsupported ADL Goal: Lower Body Dressing - Progress: Goal set today Pt Will Transfer to Toilet: with modified independence;Ambulation;with DME;Comfort height toilet ADL Goal: Toilet Transfer - Progress: Goal set today Pt Will Perform Toileting - Clothing Manipulation: with modified independence;Standing;Sitting on 3-in-1 or toilet ADL Goal: Toileting - Clothing Manipulation - Progress: Goal set today Pt Will Perform Toileting - Hygiene: with modified independence;Sit to stand from 3-in-1/toilet ADL Goal: Toileting - Hygiene - Progress: Goal set today  Visit Information  Last OT Received On: 01/05/12 Assistance Needed: +2 (for ambulation)    Subjective Data      Prior Functioning     Home Living Lives With: Son Available Help at Discharge: Family;Available PRN/intermittently Type of Home: House Home Access: Level entry Home Layout: One level Bathroom Shower/Tub: Tub/shower unit;Curtain Biochemist, clinical: Standard Bathroom  Accessibility: No Home Adaptive Equipment: Environmental consultant - four wheeled Prior Function Level of Independence: Independent Able to Take Stairs?: No Driving: Yes Vocation: Retired Corporate investment banker: No difficulties Dominant Hand: Right         Vision/Perception     Cognition  Overall Cognitive Status: Appears within functional limits for tasks assessed/performed Arousal/Alertness: Awake/alert Orientation Level: Appears intact for tasks assessed Behavior During Session: [facility] for tasks performed Cognition - Other Comments: slightly slow to process questions/commands    Extremity/Trunk Assessment Right Upper Extremity Assessment RUE ROM/Strength/Tone: [facility] for tasks assessed;Deficits RUE ROM/Strength/Tone Deficits: 4/5 throughout Left Upper Extremity Assessment LUE ROM/Strength/Tone: WFL for tasks assessed;Deficits LUE ROM/Strength/Tone Deficits: 4/5 throughout     Mobility Bed Mobility Bed Mobility: Not assessed (pt up in chair upon OT arrival) Transfers Transfers: Sit to Stand;Stand to Sit Sit to Stand: 2: Max assist;From chair/3-in-1;With armrests;With upper extremity assist Stand to Sit: 3: Mod assist;To chair/3-in-1;With armrests;With upper extremity assist Details for Transfer Assistance: Pt rocks at [facility] to build momentum for power up.  Assist posteriorly for power up and balance once in standing.  Cueing to scoot to EOC and for safe hand placement.  Performed x2.     Shoulder Instructions     Exercise     Balance Balance Balance Assessed: Yes Static Standing Balance Static Standing - Balance Support: Bilateral upper extremity supported (UEs supported on RW) Static Standing - Level of Assistance: 4: Min assist Static Standing - Comment/# of Minutes: Pt stood x2, ~1 minute each time.   End of Session OT - End of Session Equipment Utilized During Treatment: Gait belt Activity Tolerance: Patient tolerated treatment well Patient left: in chair;with call  bell/phone within reach;with chair alarm set  GO    01/05/2012 Darrol Jump OTR/L Pager [phone] Office [phone]  Darrol Jump 01/05/2012, 4:26 PM

## 2012-01-05 NOTE — Progress Notes (Signed)
Hypoglycemic Event  CBG: 55  Treatment: 15 GM carbohydrate snack  Symptoms: None  Follow-up CBG: Time:2255 CBG Result:47  Possible Reasons for Event: Unknown  Comments/MD notified:No    Damon [name], Thea Gist  Remember to initiate Hypoglycemia Order Set & complete

## 2012-01-05 NOTE — Progress Notes (Signed)
[facility] PROGRESS NOTE  Damon [name] [id number] DOB: 1933-03-12 DOA: 12/30/2011 PCP: Arnette Norris, MD  Assessment/Plan: 1. Generalized weakness: secondary to dehydration and ongoing diarrhea. Will continue IVF's and supportive care. 2. Shiga toxin  and bright red blood in stool: Status post colonoscopy on 12/10 which was consistent with infectious etiology, verbal report on 12/11 indicated Shigella-positive, but final report today 12/12 states shiga toxin positive but Shigella and Salmonella negative. patient on Rocephin started on 12/11 I consulted ID, discussed patient with Dr. Drucilla Schmidt and he states are other bacteria like Escherichia coli it produce Shiga toxin besides Shigella, he recommends discontinuing the Rocephin and he will see patient. It is noted though that patient did appear to improve overnight on the Rocephin he received x1 day. We'll continue to observe off antibiotics, appreciate ID assistance. Patient continuing to clinically improve 3. Acute on chronic renal failure: secondary to dehydration, hypotension and continue use of ACE. Continue holding ACE; continue IVF's. Cr trending down, follow and recheck 4. DM: continue SSI and 70/30 A1C 6.8 5. Hypertriglyceridemia: Resume gemfibrozil after creatinine < 2; follow heart healthy low fat diet 6. Metabolic acidosis: Resolved, 2/2 diarrhea, dehydration and acute on chronic kidney disease. Continue bicarb repletion. slowly improving, Will increase ivf's and follow electrolytes trend. 7. DVT:SCD's  8. Deconditioning: PT OT recommending skilled nursing, social work consulted to assist with placement 9. Mild ascites: Per ultrasound, and discuss with radiology and amount of fluid very small and therapeutic paracentesis would not make a difference. Will decrease IV fluids, check albumin level also check C. med and follow. Code Status: Full Family Communication: Family at bedside Disposition Plan: SNF when medically  stable   Consultants:  GI ([facility], Dr. Oletta Lamas)  Antibiotics:  Flagyl 12/8>>12/11  Rocephin 12/11>>12/12  HPI/Subjective: -Patient denies any new complaints, stools more formed. Per family tolerating by mouth but not eating very much. Denies abdominal pain also denies nausea and no dysphagia. Objective: Filed Vitals:   01/04/12 1841 01/04/12 2030 01/05/12 0512 01/05/12 0950  BP: 132/61 138/59 138/72 122/71  Pulse: 73 70 78 78  Temp: 97.7 F (36.5 C) 97.6 F (36.4 C) 97.5 F (36.4 C) 97.9 F (36.6 C)  TempSrc: Oral Oral Oral Oral  Resp: 18 14 16 16   Height:      Weight:  100.1 kg (220 lb 10.9 oz)    SpO2: 100% 98% 100% 99%    Intake/Output Summary (Last 24 hours) at 01/05/12 1010 Last data filed at 01/05/12 0900  Gross per 24 hour  Intake   2000 ml  Output    476 ml  Net   1524 ml   Filed Weights   01/02/12 2141 01/03/12 2208 01/04/12 2030  Weight: 100.426 kg (221 lb 6.4 oz) 98.884 kg (218 lb) 100.1 kg (220 lb 10.9 oz)    Exam:   General:  NAD alert and oriented x3  Cardiovascular: S1 and S2, no rubs or gallops  Respiratory: CTA bilaterally  Abdomen: Obese, soft, nontender, no rebound, positive BS; slightly distended according to patient  Neuro: non focal  Data Reviewed: Basic Metabolic Panel:  Lab [id number] 01/04/12 0700 01/03/12 0700 01/02/12 0640 01/01/12 0505  NA 143 140 136 137 139  K 4.3 3.6 3.8 4.2 3.9  CL 115* 112 109 109 111  CO2 21 20 16* 13* 17*  GLUCOSE 188* 124* 160* 213* 93  BUN 22 28* 37* 41* 39*  CREATININE 1.53* 1.65* 1.75* 1.86* 2.05*  CALCIUM 9.5 9.3 9.3 9.1 9.3  MG -- -- -- -- --  PHOS -- -- -- -- --   Liver Function Tests:  Lab 12/31/11 0510 12/30/11 1720  AST 12 21  ALT 9 11  ALKPHOS 105 113  BILITOT 0.6 0.5  PROT 5.9* 6.7  ALBUMIN 2.7* 3.1*    Lab 12/30/11 1720  LIPASE 11  AMYLASE --   CBC:  Lab 01/04/12 0700 01/03/12 0700 01/02/12 0640 01/01/12 0505 12/31/11 0510 12/30/11 1720  WBC 10.2 10.9* 12.1*  10.9* 14.0* --  NEUTROABS -- -- -- -- -- 13.2*  HGB 14.6 15.2 15.7 16.3 15.9 --  HCT 43.1 44.5 46.4 48.6 47.5 --  MCV 82.7 81.8 84.2 85.3 84.5 --  PLT 127* 112* 140* 135* 158 --   CBG:  Lab 01/05/12 0759 01/05/12 0320 01/05/12 0113 01/04/12 2317 01/04/12 2255  GLUCAP 163* [id number]* 142* 170* 47*    Recent Results (from the past 240 hour(s))  CLOSTRIDIUM DIFFICILE BY PCR     Status: Normal   Collection Time   12/30/11  9:13 PM      Component Value Range Status Comment   C difficile by pcr NEGATIVE  NEGATIVE Final   STOOL CULTURE     Status: Normal (Preliminary result)   Collection Time   12/31/11  3:16 PM      Component Value Range Status Comment   Specimen Description STOOL   Final    Special Requests NONE   Final    Culture     Final    Value: Positive for Shiga toxin 2     NO SALMONELLA, SHIGELLA, CAMPYLOBACTER, OR YERSINIA ISOLATED     Note: CRITICAL RESULT CALLED TO, READ BACK BY AND VERIFIED WITH: CHRISTIE S@0747  ON [id number] BY [place] REFERRED TO [facility] IN [place] IDENTIFICATION/CONFIRMATION   Report Status PENDING   Incomplete      Studies: US Abdomen Limited  01/04/2012  *RADIOLOGY REPORT*  Clinical Data: Abdominal distension.  Evaluate for ascites.  LIMITED ABDOMINAL ULTRASOUND  Comparison:  None.  Findings: There is a small amount of free fluid demonstrated throughout the abdomen, around the liver edge, extending along the pericolic gutters into the pelvis.  Changes are consistent with mild diffuse abdominal ascites.  IMPRESSION: Mild diffuse abdominal ascites.   Original Report Authenticated By: Lucienne Capers, [name]    Dg Abd 2 Views  01/04/2012  *RADIOLOGY REPORT*  Clinical Data: Abdominal discomfort.  ABDOMEN - 2 VIEW  Comparison: None.  Findings: Supine and left lower decubitus images of the abdomen were obtained.  No evidence for free air.  There is a nonspecific bowel gas pattern.  There appears to be gas within the small  and large bowel.  Bony structures are grossly intact.  IMPRESSION: Nonspecific bowel gas pattern.   Original Report Authenticated By: Markus Daft, [name]     Scheduled Meds:    . cinacalcet  30 mg Oral Custom  . erythromycin   Left Eye Q6H  . insulin aspart  0-15 Units Subcutaneous TID WC  . insulin aspart protamine-insulin aspart  20 Units Subcutaneous BID WC  . sodium bicarbonate  650 mg Oral BID   Continuous Infusions:    . sodium chloride      Time spent: 15minutes    [facility] Pager [phone]. If 8PM-8AM, please contact night-coverage at [email or web address], password [facility] 01/05/2012, 10:10 AM  LOS: 6 days

## 2012-01-06 LAB — COMPREHENSIVE METABOLIC PANEL
ALT: 35 U/L (ref 0–53)
AST: 40 U/L — ABNORMAL HIGH (ref 0–37)
Albumin: 2.7 g/dL — ABNORMAL LOW (ref 3.5–5.2)
Alkaline Phosphatase: 86 U/L (ref 39–117)
BUN: 16 mg/dL (ref 6–23)
CO2: 20 mEq/L (ref 19–32)
Calcium: 10.4 mg/dL (ref 8.4–10.5)
Chloride: 113 mEq/L — ABNORMAL HIGH (ref 96–112)
Creatinine, Ser: 1.26 mg/dL (ref 0.50–1.35)
GFR calc Af Amer: 62 mL/min — ABNORMAL LOW (ref >90)
GFR calc non Af Amer: 54 mL/min — ABNORMAL LOW (ref >90)
Glucose, Bld: 140 mg/dL — ABNORMAL HIGH (ref 70–99)
Potassium: 4 mEq/L (ref 3.5–5.1)
Sodium: 144 mEq/L (ref 135–145)
Total Bilirubin: 0.7 mg/dL (ref 0.3–1.2)
Total Protein: 6.1 g/dL (ref 6.0–8.3)

## 2012-01-06 LAB — GLUCOSE, CAPILLARY
Glucose-Capillary: 109 mg/dL — ABNORMAL HIGH (ref 70–99)
Glucose-Capillary: 111 mg/dL — ABNORMAL HIGH (ref 70–99)
Glucose-Capillary: 154 mg/dL — ABNORMAL HIGH (ref 70–99)
Glucose-Capillary: 175 mg/dL — ABNORMAL HIGH (ref 70–99)
Glucose-Capillary: 53 mg/dL — ABNORMAL LOW (ref 70–99)
Glucose-Capillary: 63 mg/dL — ABNORMAL LOW (ref 70–99)
Glucose-Capillary: 97 mg/dL (ref 70–99)
Glucose-Capillary: 97 mg/dL (ref 70–99)

## 2012-01-06 LAB — LIPASE, BLOOD: Lipase: 24 U/L (ref 11–59)

## 2012-01-06 LAB — TROPONIN I: Troponin I: <0.3 ng/mL (ref <0.30)

## 2012-01-06 LAB — AMMONIA: Ammonia: 36 umol/L (ref 11–60)

## 2012-01-06 MED ORDER — DEXTROSE 50 % IV SOLN
INTRAVENOUS | Status: AC
Start: 2012-01-06 — End: 2012-01-06
  Administered 2012-01-06: 25 mL via INTRAVENOUS
  Filled 2012-01-06: qty 50

## 2012-01-06 MED ORDER — DEXTROSE 50 % IV SOLN
INTRAVENOUS | Status: AC
  Filled 2012-01-06: qty 50

## 2012-01-06 MED ORDER — RISPERIDONE 0.25 MG PO TABS
0.2500 mg | ORAL_TABLET | Freq: Every evening | ORAL | Status: DC
  Administered 2012-01-06 – 2012-01-07 (×2): 0.25 mg via ORAL
  Filled 2012-01-06 (×2): qty 1

## 2012-01-06 MED ORDER — INSULIN ASPART PROT & ASPART (70-30 MIX) 100 UNIT/ML ~~LOC~~ SUSP
10.0000 [IU] | Freq: Two times a day (BID) | SUBCUTANEOUS | Status: DC
  Administered 2012-01-06 – 2012-01-08 (×5): 10 [IU] via SUBCUTANEOUS
  Filled 2012-01-06 (×2): qty 3

## 2012-01-06 MED ORDER — HYDRALAZINE HCL 25 MG PO TABS
25.0000 mg | ORAL_TABLET | Freq: Two times a day (BID) | ORAL | Status: DC
  Administered 2012-01-06 – 2012-01-09 (×7): 25 mg via ORAL
  Filled 2012-01-06 (×9): qty 1

## 2012-01-06 MED ORDER — FUROSEMIDE 10 MG/ML IJ SOLN
40.0000 mg | Freq: Once | INTRAMUSCULAR | Status: AC
  Administered 2012-01-06: 40 mg via INTRAVENOUS
  Filled 2012-01-06: qty 4

## 2012-01-06 MED ORDER — DEXTROSE 50 % IV SOLN
50.0000 mL | Freq: Once | INTRAVENOUS | Status: AC | PRN
  Administered 2012-01-06: 50 mL via INTRAVENOUS

## 2012-01-06 MED ORDER — ISOSORBIDE MONONITRATE 15 MG HALF TABLET
15.0000 mg | ORAL_TABLET | Freq: Every day | ORAL | Status: DC
  Administered 2012-01-06 – 2012-01-08 (×3): 15 mg via ORAL
  Filled 2012-01-06 (×4): qty 1

## 2012-01-06 MED ORDER — ASPIRIN 81 MG PO CHEW
81.0000 mg | CHEWABLE_TABLET | Freq: Every day | ORAL | Status: DC
  Administered 2012-01-06 – 2012-01-15 (×8): 81 mg via ORAL
  Filled 2012-01-06 (×8): qty 1

## 2012-01-06 NOTE — Progress Notes (Signed)
[facility] PROGRESS NOTE  Damon [name] [id number] DOB: [birth year] DOA: 12/30/2011 PCP: Arnette Norris, MD  Assessment/Plan: 1. Generalized weakness: secondary to dehydration and ongoing diarrhea. Will continue IVF's and supportive care. 2. Shiga toxin  and bright red blood in stool: Status post colonoscopy on 12/10 which was consistent with infectious etiology, verbal report on 12/11 indicated Shigella-positive, but final report today 12/12 states shiga toxin positive but Shigella and Salmonella negative. patient on Rocephin started on 12/11 I consulted ID, discussed patient with Dr. Drucilla Schmidt and he states are other bacteria like Escherichia coli it produce Shiga toxin besides Shigella, he recommends discontinuing the Rocephin and he will see patient. It is noted though that patient did appear to improve overnight on the Rocephin he received x1 day. We'll continue to observe off antibiotics, appreciate ID assistance. C/o abd pain today 12/15- will also check lipase and folow 3. Acute on chronic renal failure: secondary to dehydration, hypotension and continue use of ACE. Continue holding ACE;resolved, will dc IVF's.  4. DM with hypoglycemic episodes: continue SSI and will decrease 70/30 to 10units and follow A1C 6.8 5. Hypertriglyceridemia: Resume gemfibrozil after creatinine < 2; follow heart healthy low fat diet 6. Metabolic acidosis: Resolved, 2/2 diarrhea, dehydration and acute on chronic kidney disease. Resolved with hydration, IVF dc'ed 7. DVT:SCD's  8. Deconditioning: PT OT recommending skilled nursing, social work consulted to assist with placement 9. Mild ascites: Per ultrasound, and discussed with radiologist on 12/14 and amount of fluid very small and therapeutic paracentesis would not make a difference. heplock IV fluids, albumin 2.7 and LFTs wnl. Will give dose of lasix 10.AMS: possible sundowning/delirium. Per nsg staff pt has had some intermittent confusion. But last pm was more  confused and combative - will start on risperidone -continue supportive care without abx for shiga toxin -hypoglycemia might have been a factor- treating as above 11. HTN: will start on hydralazine and imdur for BP controll,continue holding ACEI 12.Arrhythmia: pt with h/o arrhythmia and EKG last pm with undetermined rhythm- will repeat EKG, CEs so far neg, will obtain EKG  Code Status: Full Family Communication: with daughter Disposition Plan: SNF when medically stable   Consultants:  GI ([facility], Dr. Oletta Lamas)  ID-Dr Tommy Medal  Antibiotics:  Flagyl 12/8>>12/11  Rocephin 12/11>>12/12  HPI/Subjective: -Patient denies any new complaints, stools more formed. Per family tolerating by mouth but not eating very much. Denies abdominal pain also denies nausea and no dysphagia. Objective: Filed Vitals:   01/05/12 0950 01/05/12 1814 01/05/12 2108 01/06/12 0457  BP: 122/71 146/64 158/66 155/66  Pulse: 78 75 78 67  Temp: 97.9 F (36.6 C) 97.8 F (36.6 C) 97.4 F (36.3 C) 97.4 F (36.3 C)  TempSrc: Oral Oral Oral Oral  Resp: 16 16 17 18   Height:      Weight:   103.284 kg (227 lb 11.2 oz)   SpO2: 99% 100% 98% 100%    Intake/Output Summary (Last 24 hours) at 01/06/12 0854 Last data filed at 01/05/12 1700  Gross per 24 hour  Intake    320 ml  Output      0 ml  Net    320 ml   Filed Weights   01/03/12 2208 01/04/12 2030 01/05/12 2108  Weight: 98.884 kg (218 lb) 100.1 kg (220 lb 10.9 oz) 103.284 kg (227 lb 11.2 oz)    Exam:   General:  NAD alert and oriented x3  Cardiovascular: S1 and S2, no rubs or gallops  Respiratory: CTA bilaterally  Abdomen:  Obese, soft, nontender, no rebound, positive BS; distended  Neuro: non focal  Data Reviewed: Basic Metabolic Panel:  Lab [id number] 01/05/12 0555 01/04/12 0700 01/03/12 0700 01/02/12 0640 01/01/12 0505  NA -- 143 140 136 137 139  K -- 4.3 3.6 3.8 4.2 3.9  CL -- 115* 112 109 109 111  CO2 -- 21 20 16* 13* 17*  GLUCOSE --  188* 124* 160* 213* 93  BUN -- 22 28* 37* 41* 39*  CREATININE -- 1.53* 1.65* 1.75* 1.86* 2.05*  CALCIUM -- 9.5 9.3 9.3 9.1 9.3  MG 1.9 -- -- -- -- --  PHOS -- -- -- -- -- --   Liver Function Tests:  Lab 12/31/11 0510 12/30/11 1720  AST 12 21  ALT 9 11  ALKPHOS 105 113  BILITOT 0.6 0.5  PROT 5.9* 6.7  ALBUMIN 2.7* 3.1*    Lab 12/30/11 1720  LIPASE 11  AMYLASE --   CBC:  Lab 01/04/12 0700 01/03/12 0700 01/02/12 0640 01/01/12 0505 12/31/11 0510 12/30/11 1720  WBC 10.2 10.9* 12.1* 10.9* 14.0* --  NEUTROABS -- -- -- -- -- 13.2*  HGB 14.6 15.2 15.7 16.3 15.9 --  HCT 43.1 44.5 46.4 48.6 47.5 --  MCV 82.7 81.8 84.2 85.3 84.5 --  PLT 127* 112* 140* 135* 158 --   CBG:  Lab 01/06/12 0804 01/06/12 0641 01/06/12 0538 01/06/12 0428 01/06/12 0129  GLUCAP 97 109* 97 63* 111*    Recent Results (from the past 240 hour(s))  CLOSTRIDIUM DIFFICILE BY PCR     Status: Normal   Collection Time   12/30/11  9:13 PM      Component Value Range Status Comment   C difficile by pcr NEGATIVE  NEGATIVE Final   STOOL CULTURE     Status: Normal (Preliminary result)   Collection Time   12/31/11  3:16 PM      Component Value Range Status Comment   Specimen Description STOOL   Final    Special Requests NONE   Final    Culture     Final    Value: Positive for Shiga toxin 2     NO SALMONELLA, SHIGELLA, CAMPYLOBACTER, OR YERSINIA ISOLATED     Note: CRITICAL RESULT CALLED TO, READ BACK BY AND VERIFIED WITH: CHRISTIE S@0747  ON [id number] BY [place]. [place] REFERRED TO [facility] LABORATORY IN [place] IDENTIFICATION/CONFIRMATION   Report Status PENDING   Incomplete      Studies: US Abdomen Limited  01/04/2012  *RADIOLOGY REPORT*  Clinical Data: Abdominal distension.  Evaluate for ascites.  LIMITED ABDOMINAL ULTRASOUND  Comparison:  None.  Findings: There is a small amount of free fluid demonstrated throughout the abdomen, around the liver edge, extending along the pericolic gutters into  the pelvis.  Changes are consistent with mild diffuse abdominal ascites.  IMPRESSION: Mild diffuse abdominal ascites.   Original Report Authenticated By: Lucienne Capers, [name]    Dg Abd 2 Views  01/04/2012  *RADIOLOGY REPORT*  Clinical Data: Abdominal discomfort.  ABDOMEN - 2 VIEW  Comparison: None.  Findings: Supine and left lower decubitus images of the abdomen were obtained.  No evidence for free air.  There is a nonspecific bowel gas pattern.  There appears to be gas within the small and large bowel.  Bony structures are grossly intact.  IMPRESSION: Nonspecific bowel gas pattern.   Original Report Authenticated By: Markus Daft, [name]     Scheduled Meds:    . cinacalcet  30 mg Oral Custom  .  dextrose      . erythromycin   Left Eye Q6H  . furosemide  40 mg Intravenous Once  . insulin aspart  0-15 Units Subcutaneous TID WC  . insulin aspart protamine-insulin aspart  20 Units Subcutaneous BID WC  . sodium bicarbonate  650 mg Oral BID   Continuous Infusions:    Time spent: 17minutes    [facility] Pager [phone]. If 8PM-8AM, please contact night-coverage at [email or web address], password [facility] 01/06/2012, 8:54 AM  LOS: 7 days

## 2012-01-06 NOTE — Progress Notes (Signed)
At shift change, patient's rhythm changed from 1st Degree HB to new onset AFIB.  VS stable.  Patient is confused and thinks he is at [facility]  EKG done.  NP with [facility] came and viewed the EKG.  Rate is stable.  Gave one time order for IV Ativan 1 mg for increasing restlessness/agitation at 2238.  This was given and patient slept for 1 to 2 hours.  He did have a low BS of 53 at 0036 and 25 ml D50 given.  Follow up BS was 111 at 0129.  Labs were attempted to be drawn and patient refused.  He is more confused.  He was incontinent of urine and we needed to remove his pants.  He became very agitated.  He was cursing and striking at staff.  Patient was finally cleaned and became more calm.  Called [facility] and made aware of the lab refusal and increasing agitation.  No new orders received.  Will continue to monitor patient.  Kammi Hechler, Thea Gist

## 2012-01-07 ENCOUNTER — Telehealth: Payer: Self-pay

## 2012-01-07 LAB — GLUCOSE, CAPILLARY
Glucose-Capillary: 129 mg/dL — ABNORMAL HIGH (ref 70–99)
Glucose-Capillary: 118 mg/dL — ABNORMAL HIGH (ref 70–99)
Glucose-Capillary: 164 mg/dL — ABNORMAL HIGH (ref 70–99)
Glucose-Capillary: 167 mg/dL — ABNORMAL HIGH (ref 70–99)
Glucose-Capillary: 149 mg/dL — ABNORMAL HIGH (ref 70–99)
Glucose-Capillary: 88 mg/dL (ref 70–99)

## 2012-01-07 LAB — BASIC METABOLIC PANEL
BUN: 16 mg/dL (ref 6–23)
CO2: 20 mEq/L (ref 19–32)
Calcium: 9.9 mg/dL (ref 8.4–10.5)
Chloride: 115 mEq/L — ABNORMAL HIGH (ref 96–112)
Creatinine, Ser: 1.32 mg/dL (ref 0.50–1.35)
GFR calc Af Amer: 59 mL/min — ABNORMAL LOW (ref >90)
GFR calc non Af Amer: 51 mL/min — ABNORMAL LOW (ref >90)
Glucose, Bld: 164 mg/dL — ABNORMAL HIGH (ref 70–99)
Potassium: 4.2 mEq/L (ref 3.5–5.1)
Sodium: 147 mEq/L — ABNORMAL HIGH (ref 135–145)

## 2012-01-07 MED ORDER — SODIUM CHLORIDE 0.45 % IV SOLN
INTRAVENOUS | Status: DC
  Administered 2012-01-07: 17:00:00 via INTRAVENOUS

## 2012-01-07 MED ORDER — SODIUM CHLORIDE 0.45 % IV BOLUS
200.0000 mL | Freq: Once | INTRAVENOUS | Status: AC
  Administered 2012-01-07: 200 mL via INTRAVENOUS

## 2012-01-07 MED ORDER — GLUCERNA SHAKE PO LIQD
237.0000 mL | Freq: Three times a day (TID) | ORAL | Status: DC
  Administered 2012-01-07 – 2012-01-15 (×4): 237 mL via ORAL
  Filled 2012-01-07: qty 237

## 2012-01-07 MED ORDER — RISPERIDONE 0.5 MG PO TABS
0.5000 mg | ORAL_TABLET | Freq: Two times a day (BID) | ORAL | Status: DC
  Administered 2012-01-08: 0.5 mg via ORAL
  Filled 2012-01-07 (×4): qty 1

## 2012-01-07 MED ORDER — HALOPERIDOL LACTATE 5 MG/ML IJ SOLN
0.5000 mg | Freq: Once | INTRAMUSCULAR | Status: AC
  Administered 2012-01-07: 0.5 mg via INTRAMUSCULAR
  Filled 2012-01-07: qty 1

## 2012-01-07 MED ORDER — RISPERIDONE 0.25 MG PO TABS
0.2500 mg | ORAL_TABLET | ORAL | Status: AC
  Administered 2012-01-08: 0.25 mg via ORAL
  Filled 2012-01-07: qty 1

## 2012-01-07 NOTE — Telephone Encounter (Signed)
pts father in [facility] now with bacterial infection for 1 week. [name] wants to talk with Dr Deborra Medina about father's condition. That is all info [name] will give;will talk with Dr Deborra Medina.

## 2012-01-07 NOTE — Progress Notes (Signed)
Rehab Admissions Coordinator Note:  Patient was screened by Cleatrice Burke for appropriateness for an Inpatient Acute Rehab Consult. Noted PT recommending an inpt rehab consult. [employer] Medicare will not approve inpt rehab admission for this diagnosis.  At this time, we are recommending [facility].  Cleatrice Burke, RN 01/07/2012, 3:26 PM  I can be reached at [phone].

## 2012-01-07 NOTE — Progress Notes (Signed)
[facility] PROGRESS NOTE  Damon [name] [id number] DOB: 1933/08/05 DOA: 12/30/2011 PCP: Arnette Norris, MD  Assessment/Plan: 1. Generalized weakness: secondary to dehydration/diarrhea- now resolved. Will continue supportive care. 2. Shiga toxin  and bright red blood in stool: Status post colonoscopy on 12/10 which was consistent with infectious etiology, verbal report on 12/11 indicated Shigella-positive, but final report today 12/12 states shiga toxin positive but Shigella and Salmonella negative. patient on Rocephin started on 12/11 I consulted ID, discussed patient with Dr. Drucilla Schmidt and he states are other bacteria like Escherichia coli it produce Shiga toxin besides Shigella, he recommends discontinuing the Rocephin and he will see patient. It is noted though that patient did appear to improve overnight on the Rocephin he received x1 day. We'll continue to observe off antibiotics, appreciate ID assistance. Clinically better today, but still with decreased by mouth intake-will add glucerna /supplement and follow..  3. Acute on chronic renal failure: secondary to dehydration, hypotension and continue use of ACE. Continue holding ACE;resolved, will dc IVF's.  4. DM with hypoglycemic episodes: continue SSI no further hypoglycemic episodes on the decreased  70/30. A1C 6.8 5. Hypertriglyceridemia: Resume gemfibrozil after creatinine < 2; follow heart healthy low fat diet 6. Metabolic acidosis: Resolved, 2/2 diarrhea, dehydration and acute on chronic kidney disease. Resolved with hydration, IVF dc'ed 7. DVT:SCD's  8. Deconditioning: PT OT recommending skilled nursing, social work consulted to assist with placement 9. Mild ascites: Per ultrasound, and discussed with radiologist on 12/14 and amount of fluid very small and therapeutic paracentesis would not make a difference. heplock IV fluids, albumin 2.7 and LFTs wnl. No further Lasix as hypernatremic today. 10.AMS: possible sundowning/delirium. Per  nsg staff pt has had some intermittent confusion. The pm on 12/14was more confused and combative, improved on risperidone -continue supportive care without abx for shiga toxin -hypoglycemia might have been a factor- now resolved. 11. HTN: Better blood pressure control on hydralazine and imdur. continue holding ACEI 12.Arrhythmia: pt with h/o arrhythmia and EKG last pm with undetermined rhythm- cardiac enzymes negative, await 2-D echo. Rate remains controlled. 13. Hypernatremia: Cautious IV fluids follow, by mouth was encouraged. Code Status: Full Family Communication: Son/family at bedside Disposition Plan: SNF when medically stable   Consultants:  GI ([facility], Dr. Oletta Lamas)  ID-Dr Tommy Medal  Antibiotics:  Flagyl 12/8>>12/11  Rocephin 12/11>>12/12  HPI/Subjective: -Patient sitting up in chair, looks better today than. Denies abdominal pain, per family some by mouth intake but still decreased. Objective: Filed Vitals:   01/06/12 1501 01/06/12 2139 01/07/12 0430 01/07/12 0930  BP: 149/78 127/51 139/53 137/59  Pulse: 67 78 66 84  Temp: 97.4 F (36.3 C) 98.1 F (36.7 C) 97.4 F (36.3 C) 97.9 F (36.6 C)  TempSrc: Oral Oral Oral   Resp: 18 18 20 18   Height:      Weight:  102.195 kg (225 lb 4.8 oz)    SpO2: 99% 99% 97% 98%    Intake/Output Summary (Last 24 hours) at 01/07/12 0933 Last data filed at 01/07/12 0931  Gross per 24 hour  Intake    200 ml  Output   1075 ml  Net   -875 ml   Filed Weights   01/04/12 2030 01/05/12 2108 01/06/12 2139  Weight: 100.1 kg (220 lb 10.9 oz) 103.284 kg (227 lb 11.2 oz) 102.195 kg (225 lb 4.8 oz)    Exam:   General:  NAD alert and oriented x3  Cardiovascular: S1 and S2, no rubs or gallops  Respiratory: Decreased breath  sounds at the bases, otherwise CTA bilaterally  Abdomen: Obese, soft, nontender, no rebound, positive BS; distended  Extremities: No cyanosis and no edema  Neuro: non focal  Data Reviewed: Basic Metabolic  Panel:  Lab [id number] 01/06/12 0900 01/05/12 2140 01/05/12 0555 01/04/12 0700 01/03/12 0700  NA 147* 144 -- 143 140 136  K 4.2 4.0 -- 4.3 3.6 3.8  CL 115* 113* -- 115* 112 109  CO2 20 20 -- 21 20 16*  GLUCOSE 164* 140* -- 188* 124* 160*  BUN 16 16 -- 22 28* 37*  CREATININE 1.32 1.26 -- 1.53* 1.65* 1.75*  CALCIUM 9.9 10.4 -- 9.5 9.3 9.3  MG -- -- 1.9 -- -- --  PHOS -- -- -- -- -- --   Liver Function Tests:  Lab 01/06/12 0900  AST 40*  ALT 35  ALKPHOS 86  BILITOT 0.7  PROT 6.1  ALBUMIN 2.7*    Lab 01/06/12 1515  LIPASE 24  AMYLASE --   CBC:  Lab 01/04/12 0700 01/03/12 0700 01/02/12 0640 01/01/12 0505  WBC 10.2 10.9* 12.1* 10.9*  NEUTROABS -- -- -- --  HGB 14.6 15.2 15.7 16.3  HCT 43.1 44.5 46.4 48.6  MCV 82.7 81.8 84.2 85.3  PLT 127* 112* 140* 135*   CBG:  Lab 01/07/12 0229 01/06/12 2334 01/06/12 1634 01/06/12 1210 01/06/12 0804  GLUCAP 129* 118* 154* 175* 97    Recent Results (from the past 240 hour(s))  CLOSTRIDIUM DIFFICILE BY PCR     Status: Normal   Collection Time   12/30/11  9:13 PM      Component Value Range Status Comment   C difficile by pcr NEGATIVE  NEGATIVE Final   STOOL CULTURE     Status: Normal (Preliminary result)   Collection Time   12/31/11  3:16 PM      Component Value Range Status Comment   Specimen Description STOOL   Final    Special Requests NONE   Final    Culture     Final    Value: Positive for Shiga toxin 2     NO SALMONELLA, SHIGELLA, CAMPYLOBACTER, OR YERSINIA ISOLATED     Note: CRITICAL RESULT CALLED TO, READ BACK BY AND VERIFIED WITH: CHRISTIE S@0747  ON [id number] BY [place] REFERRED TO [facility] LABORATORY IN [place] IDENTIFICATION/CONFIRMATION   Report Status PENDING   Incomplete      Studies: No results found.  Scheduled Meds:    . aspirin  81 mg Oral Daily  . cinacalcet  30 mg Oral Custom  . erythromycin   Left Eye Q6H  . hydrALAZINE  25 mg Oral BID  . insulin aspart  0-15 Units  Subcutaneous TID WC  . insulin aspart protamine-insulin aspart  10 Units Subcutaneous BID WC  . isosorbide mononitrate  15 mg Oral Daily  . risperiDONE  0.25 mg Oral QPM  . sodium bicarbonate  650 mg Oral BID  . sodium chloride  200 mL Intravenous Once   Continuous Infusions:    Time spent: 6minutes    [place] [facility] Pager [phone]. If 8PM-8AM, please contact night-coverage at [email or web address], password [facility] 01/07/2012, 9:33 AM  LOS: 8 days

## 2012-01-07 NOTE — Progress Notes (Signed)
01/07/2012 5:30 PM  Spoke with patient's son, who is very concerned about patient's mental status (confusion).  I called Dr. Inis Sizer, per son's request, and Dr. Inis Sizer is to have psych see the patient tomorrow.  I also explained to the son that we were very concerned for the patient's safety as he is very confused and all over the bed.  I asked if he would be willing to stay with the patient overnight to make sure that he did not fall or accidentally hurt himself.  The son agreed that he would stay overnight to keep watch on the patient.  RN informed of the plan.  Will continue to monitor. Jennette Banker

## 2012-01-07 NOTE — Progress Notes (Signed)
Physical Therapy Treatment Patient Details Name: Damon [name] MRN: [id number] DOB: [birth year] Today's Date: 01/07/2012 Time: [id number] PT Time Calculation (min): 29 min  PT Assessment / Plan / Recommendation Comments on Treatment Session  Pt wanting OOB, and pariticipating with mobility; Seems to require more assist with bed mobility and amb today; continue to recommend post-acute inpatient rehab    Follow Up Recommendations  CIR;Supervision/Assistance - 24 hour;Supervision for mobility/OOB     Does the patient have the potential to tolerate intense rehabilitation     Barriers to Discharge        Equipment Recommendations  Rolling walker with 5" wheels (3in1)    Recommendations for Other Services Rehab consult  Frequency Min 3X/week   Plan Discharge plan remains appropriate    Precautions / Restrictions Precautions Precautions: Fall   Pertinent Vitals/Pain no apparent distress     Mobility  Bed Mobility Bed Mobility: Supine to Sit;Sitting - Scoot to Edge of Bed Supine to Sit: 3: Mod assist;With rails Sitting - Scoot to Edge of Bed: 3: Mod assist Details for Bed Mobility Assistance: Heavy mod assist with cues for technique; Inefficient movements Transfers Transfers: Sit to Stand;Stand to Sit Sit to Stand: 3: Mod assist;From bed;With upper extremity assist Stand to Sit: 3: Mod assist;To chair/3-in-1;With armrests;With upper extremity assist Details for Transfer Assistance: Continued need for moderate assist for successful sit to stand; physical assist to steady RW as pt pulled up on RW; difficulty flexing hips and knees for stand to sit to chair; took increased time, and hand-over-hand assist to reach back for armrests Ambulation/Gait Ambulation/Gait Assistance: 3: Mod assist Ambulation Distance (Feet): 75 Feet Assistive device: Rolling walker Ambulation/Gait Assistance Details: Significantly flexed trunk, with inability to acheive fully upright standing despite mod to  max cues (possibly due to weakness, abdominal discomfort, newness of use of RW with support in front of pt); increased gait speed, with decr control of RW requiring at least mod assist to steady/control RW Gait Pattern: Step-through pattern;Decreased step length - right;Decreased step length - left;Trunk flexed    Exercises     PT Diagnosis:    PT Problem List:   PT Treatment Interventions:     PT Goals Acute Rehab PT Goals Time For Goal Achievement: 01/11/12 Potential to Achieve Goals: Good Pt will go Supine/Side to Sit: with supervision PT Goal: Supine/Side to Sit - Progress: Progressing toward goal Pt will go Sit to Stand: with supervision PT Goal: Sit to Stand - Progress: Progressing toward goal Pt will go Stand to Sit: with supervision PT Goal: Stand to Sit - Progress: Not progressing Pt will Ambulate: >150 feet;with least restrictive assistive device PT Goal: Ambulate - Progress: Progressing toward goal  Visit Information  Last PT Received On: 01/07/12 Assistance Needed: +2 (helpful for progressive amb)    Subjective Data  Subjective: Agreeable to OOB   Cognition  Overall Cognitive Status: Appears within functional limits for tasks assessed/performed Arousal/Alertness: Awake/alert Orientation Level: Appears intact for tasks assessed Behavior During Session: [facility] for tasks performed    Balance  Balance Balance Assessed: Yes Static Standing Balance Static Standing - Balance Support: Right upper extremity supported;Left upper extremity supported Static Standing - Level of Assistance: 4: Min assist Static Standing - Comment/# of Minutes: Stood at sink to wash hands (chair behind for safety)  End of Session PT - End of Session Equipment Utilized During Treatment: Gait belt Activity Tolerance: Patient limited by fatigue Patient left: in chair;with call bell/phone within reach;with family/visitor present Nurse Communication:  Mobility status   GP     Roney Marion  [facility] [place], [place]  01/07/2012, 1:24 PM

## 2012-01-07 NOTE — Clinical Social Work Placement (Addendum)
Clinical Social Work Department CLINICAL SOCIAL WORK PLACEMENT NOTE 01/07/2012  Patient:  Damon [name],Damon [name]  Account Number:  [id number] Admit date:  12/30/2011  Clinical Social Worker:  Mordecai Tindol Givens, LCSW  Date/time:  01/07/2012 03:47 AM  Clinical Social Work is seeking post-discharge placement for this patient at the following level of care:      (*CSW will update this form in Epic as items are completed)   01/07/2012  Patient/family provided with [facility] Department of Clinical Social Work's list of facilities offering this level of care within the geographic area requested by the patient (or if unable, by the patient's family).  01/07/2012  Patient/family informed of their freedom to choose among providers that offer the needed level of care, that participate in Medicare, Medicaid or managed care program needed by the patient, have an available bed and are willing to accept the patient.    Patient/family informed of [facility]' ownership interest in [facility], as well as of the fact that they are under no obligation to receive care at this facility.  PASARR submitted to EDS on 01/07/2012 PASARR number received from EDS on 01/07/12  FL2 transmitted to all facilities in geographic area requested by pt/family on  01/07/2012 FL2 transmitted to all facilities within larger geographic area on 01/08/12 [facility] per daughter's request  Patient informed that his/her managed care company has contracts with or will negotiate with  certain facilities, including the following:     Patient/family informed of bed offers received: 01/08/12 and 01/09/12  Patient chooses bed at  Physician recommends and patient chooses bed at    Patient to be transferred to on   Patient to be transferred to facility by   The following physician request were entered in Epic:   Additional Comments: 01/08/12 - CSW talked with daughter by phone to give bed offers. She will [facility], but requested that bed search be done in [facility] in case she does not like this facility. 01/09/12 - CSW talked with patient's son and daughter and gave [facility] bed offers. Family chose [facility] skilled nursing facility.

## 2012-01-07 NOTE — Clinical Social Work Psychosocial (Signed)
Clinical Social Work Department BRIEF PSYCHOSOCIAL ASSESSMENT 01/07/2012  Patient:  Damon [name],Damon [name]     Account Number:  [id number]     Admit date:  12/30/2011  Clinical Social Worker:  Frederico Hamman  Date/Time:  01/07/2012 03:40 AM  Referred by:  Physician  Date Referred:  01/04/2012 Referred for  SNF Placement   Other Referral:   Interview type:  Family Other interview type:    PSYCHOSOCIAL DATA Living Status:  FAMILY Admitted from facility:   Level of care:   Primary support name:  [name] [place] Primary support relationship to patient:  CHILD, ADULT Degree of support available:   Patient lives with son Hulon and there is one other son also. Daughter's phone number is [phone].    CURRENT CONCERNS Current Concerns  Post-Acute Placement   Other Concerns:    SOCIAL WORK ASSESSMENT / PLAN CSW talked with patient's 3 children regarding discharge plans and they are in agreement. CSW advised that they have 2 preferences in  [facility]: e and have their fac(1) [place] and (2) [facility]. CSW advised that the family contact will be daughter Designer, television/film set. CSW explained facility search process and answered the family's questions.   Assessment/plan status:  Information/Referral to [employer] Other assessment/ plan:   Information/referral to community resources:   Family given skilled facility list for [facility].    PATIENT'S/FAMILY'S RESPONSE TO PLAN OF CARE: Family was looking forward to talking with CSW and appreciative of CSW's efforts in locating placement. Family advised that they will updated on responses from skilled facilities.

## 2012-01-08 ENCOUNTER — Inpatient Hospital Stay (HOSPITAL_COMMUNITY): Payer: Medicare Other

## 2012-01-08 DIAGNOSIS — F4489 Other dissociative and conversion disorders: Secondary | ICD-10-CM | POA: Diagnosis present

## 2012-01-08 DIAGNOSIS — F29 Unspecified psychosis not due to a substance or known physiological condition: Secondary | ICD-10-CM

## 2012-01-08 LAB — GLUCOSE, CAPILLARY
Glucose-Capillary: 142 mg/dL — ABNORMAL HIGH (ref 70–99)
Glucose-Capillary: 158 mg/dL — ABNORMAL HIGH (ref 70–99)
Glucose-Capillary: 91 mg/dL (ref 70–99)

## 2012-01-08 LAB — BASIC METABOLIC PANEL
BUN: 18 mg/dL (ref 6–23)
CO2: 21 mEq/L (ref 19–32)
Calcium: 9.9 mg/dL (ref 8.4–10.5)
Chloride: 115 mEq/L — ABNORMAL HIGH (ref 96–112)
Creatinine, Ser: 1.34 mg/dL (ref 0.50–1.35)
GFR calc Af Amer: 58 mL/min — ABNORMAL LOW (ref >90)
GFR calc non Af Amer: 50 mL/min — ABNORMAL LOW (ref >90)
Glucose, Bld: 141 mg/dL — ABNORMAL HIGH (ref 70–99)
Potassium: 4.4 mEq/L (ref 3.5–5.1)
Sodium: 149 mEq/L — ABNORMAL HIGH (ref 135–145)

## 2012-01-08 LAB — VITAMIN B12: Vitamin B-12: 596 pg/mL (ref 211–911)

## 2012-01-08 LAB — RPR: RPR Ser Ql: NONREACTIVE

## 2012-01-08 MED ORDER — DEXTROSE 5 % IV SOLN
INTRAVENOUS | Status: DC
  Administered 2012-01-08: 50 mL via INTRAVENOUS
  Administered 2012-01-10: 1000 mL via INTRAVENOUS

## 2012-01-08 MED ORDER — PRO-STAT SUGAR FREE PO LIQD
30.0000 mL | Freq: Two times a day (BID) | ORAL | Status: DC
  Administered 2012-01-08 – 2012-01-09 (×2): 30 mL via ORAL
  Filled 2012-01-08 (×6): qty 30

## 2012-01-08 MED ORDER — ADULT MULTIVITAMIN W/MINERALS CH
1.0000 | ORAL_TABLET | Freq: Every day | ORAL | Status: DC
  Administered 2012-01-08 – 2012-01-15 (×6): 1 via ORAL
  Filled 2012-01-08 (×8): qty 1

## 2012-01-08 MED ORDER — RISPERIDONE 0.5 MG PO TBDP
0.5000 mg | ORAL_TABLET | Freq: Every day | ORAL | Status: DC | PRN
  Filled 2012-01-08 (×2): qty 1

## 2012-01-08 MED ORDER — RISPERIDONE 0.5 MG PO TABS
0.5000 mg | ORAL_TABLET | Freq: Every evening | ORAL | Status: DC
  Administered 2012-01-08: 0.5 mg via ORAL
  Filled 2012-01-08 (×2): qty 1

## 2012-01-08 NOTE — Progress Notes (Signed)
INITIAL NUTRITION ASSESSMENT  DOCUMENTATION CODES Per approved criteria  -Obesity Unspecified   INTERVENTION: 1. Continue to encourage Glucerna Shakes as able, offer meal alternatives if pt refuses 2. 30 ml Prostat po BID, each supplement provides 100 kcal and 15 grams protein. 3. MVI daily 4. RD to continue to follow nutrition care plan  NUTRITION DIAGNOSIS: Inadequate oral intake related to poor mental status and appetite as evidenced by poor meal completion.   Goal: Pt to meet >/= 90% of their estimated nutrition needs.  Monitor:  weight trends, lab trends, I/O's, PO intake, supplement tolerance  Reason for Assessment: Malnutrition Screening  78 y.o. male  Admitting Dx: abdominal pain  ASSESSMENT: Admitted with severe diarrhea. S/p colonoscopy, pt with Shigella-positive lab work. Possible E coli. Metabolic acidosis 2/2 diarrhea and dehydration resolved since admission. Pt noted to be very confused and agitated today. Intake over the past 5 days has been extremely variable, ranging from 0 - 100%.   Pt with mittens on at this time, appears very agitated. RN reports that pt was able to take a few bites of lunch and half of a  Glucerna Shake.  Height: Ht Readings from Last 1 Encounters:  12/31/11 5\' 5"  (1.651 m)    Weight: Wt Readings from Last 1 Encounters:  01/07/12 225 lb 4.8 oz (102.195 kg)   Ideal Body Weight: 136 lb/61.8 kg  % Ideal Body Weight: 165%  Wt Readings from Last 10 Encounters:  01/07/12 225 lb 4.8 oz (102.195 kg)  01/07/12 225 lb 4.8 oz (102.195 kg)  12/13/11 212 lb (96.163 kg)  05/03/11 212 lb (96.163 kg)  01/02/11 206 lb 8 oz (93.668 kg)  08/11/10 192 lb (87.091 kg)  08/09/10 192 lb 8 oz (87.317 kg)  07/03/10 193 lb 12 oz (87.884 kg)  03/28/10 190 lb 12 oz (86.524 kg)  01/18/10 192 lb 12 oz (87.431 kg)   Usual Body Weight: 190 - 192 lb  % Usual Body Weight: 118%  BMI:  Body mass index is 37.49 kg/(m^2). Obese Class II.  Estimated  Nutritional Needs: Kcal: 1800 - 2000 kcal Protein: 95 - 110 grams Fluid: 1.8 - 2 liters daily  Skin: intact  Diet Order: Fiber Restricted  EDUCATION NEEDS: -No education needs identified at this time   Intake/Output Summary (Last 24 hours) at 01/08/12 1316 Last data filed at 01/08/12 0900  Gross per 24 hour  Intake    250 ml  Output    150 ml  Net    100 ml   Last BM: 12/16  Labs:   Lab 01/08/12 0505 01/07/12 0710 01/06/12 0900 01/05/12 2140  NA 149* 147* 144 --  K 4.4 4.2 4.0 --  CL 115* 115* 113* --  CO2 21 20 20  --  BUN 18 16 16  --  CREATININE 1.34 1.32 1.26 --  CALCIUM 9.9 9.9 10.4 --  MG -- -- -- 1.9  PHOS -- -- -- --  GLUCOSE 141* 164* 140* --    CBG (last 3)   Basename 01/08/12 0737 01/07/12 2127 01/07/12 1636  GLUCAP 142* 88 149*    Scheduled Meds:   . aspirin  81 mg Oral Daily  . cinacalcet  30 mg Oral Custom  . erythromycin   Left Eye Q6H  . feeding supplement  237 mL Oral TID BM  . hydrALAZINE  25 mg Oral BID  . insulin aspart  0-15 Units Subcutaneous TID WC  . insulin aspart protamine-insulin aspart  10 Units Subcutaneous BID WC  .  isosorbide mononitrate  15 mg Oral Daily  . risperiDONE  0.5 mg Oral BID  . sodium bicarbonate  650 mg Oral BID    Continuous Infusions:   . dextrose 50 mL (01/08/12 [id number])    Past Medical History  Diagnosis Date  . Bradycardia   . Diabetes mellitus   . Hypertension   . Chronic kidney disease     BAseline creatinine 1.4 to 1.5  . Arrhythmia     Baseline bradycardia  . Hard of hearing     Past Surgical History  Procedure Date  . Tonsillectomy   . Colonoscopy 01/01/2012    Procedure: COLONOSCOPY;  Surgeon: [name], MD;  Location: [facility] ENDOSCOPY;  Service: Endoscopy;  Laterality: N/A;    Inda Coke MS, RD, LDN Pager: [phone] After-hours pager: [phone]

## 2012-01-08 NOTE — Progress Notes (Signed)
[facility] PROGRESS NOTE  Damon [name] [id number] DOB: 1933-08-07 DOA: 12/30/2011 PCP: Arnette Norris, MD  Assessment/Plan:  1. Shiga toxin  and bright red blood in stool: Status post colonoscopy on 12/10 which was consistent with infectious etiology, verbal report on 12/11 indicated Shigella-positive, but final report today 12/12 states shiga toxin positive but Shigella and Salmonella negative. patient on Rocephin started on 12/11 I consulted ID, discussed patient with Dr. Drucilla Schmidt and he states are other bacteria like Escherichia coli it produce Shiga toxin besides Shigella, he recommended discontinuing the Rocephin and this was done. His been managed supportively, No further diarrhea reported, but still poor mouth intake.  2.  Hypernatremia: Secondary to dehydration, from poor by mouth intake as above. The diarrhea is resolved. Cautious IV hypotonic fluids follow, by mouth was encouraged. 3. Acute on chronic renal failure: Resolved, was secondary to dehydration, hypotension and continue use of ACE. Continue holding ACE.  4. DM with hypoglycemic episodes:  no further hypoglycemic episodes on the decreased  70/30, continue SSI. On current IV fluids A1C 6.8 5. Hypertriglyceridemia: Resume gemfibrozil after creatinine < 2; follow heart healthy low fat diet 6. Metabolic acidosis: Resolved, 2/2 diarrhea, dehydration and acute on chronic kidney disease. Resolved with hydration,  7.  Deconditioning: PT OT recommending skilled nursing, social work consulted to assist with placement 8. Mild ascites: Per ultrasound, and discussed with radiologist on 12/14 and amount of fluid very small and therapeutic paracentesis would not make a difference.albumin 2.7 and LFTs wnl. No further Lasix as hypernatremic today. IV fluids had been DC'd but gentle hydration now resumed due to hypernatremia 9.AMS: possible sundowning/delirium. Per nsg staff pt has had some intermittent confusion. But beginning on pm of 12/14  was more confused and combative and so was started on risperidone. He initially appeared to improve on risperidone, but last PM very confused and restless. Ammonia level was done and within nl limits. -hypoglycemia might have been a factor- now resolved. -Hypernatremia now contributing, started on hypotonic fluids as above. -CT scan of head ordered today shows no acute intracranial findings -Consulted Dr. Evern Bio is to see patient today, in the meantime she has recommended to keep on the risperidone QPM, but to change to an immediate release formulation in the morning/daily when necessary -obtain B12, RPR, folate follow 10. HTN: Better blood pressure control on hydralazine and imdur. continue holding ACEI 11.?Arrhythmia: pt with h/o arrhythmia and EKG 12/15 with undetermined rhythm, but the repeat EKG of 12/16 showed normal sinus- cardiac enzymes negative, still await 2-D echo for evaluation of EF.  12.Generalized weakness: secondary to dehydration/diarrhea, and deconditioning. PT OT recommending skilled nursing. 13. ?Dysphagia: Swallow evaluation, follow.  DVT prophylaxis: SCDs Code Status: Full Family Communication: Long discussion with Son and daughter at bedside Disposition Plan: SNF when medically stable   Consultants:  GI ([facility], Dr. Oletta Lamas)  ID-Dr Tommy Medal  Dr. Evern Bio- eval pending.  Antibiotics:  Flagyl 12/8>>12/11  Rocephin 12/11>>12/12  HPI/Subjective: -Per nursing confused restless overnight, somnolent this a.m. (status post Haldol last p.m. and risperidone this a.m.), disoriented. Per nursing appear to be having difficulty swallowing this morning. Objective: Filed Vitals:   01/07/12 1649 01/07/12 2121 01/08/12 0537 01/08/12 0930  BP: 144/64 130/60 145/82 140/50  Pulse: 71 77 75 105  Temp: 97.6 F (36.4 C) 97.9 F (36.6 C) 97.6 F (36.4 C) 98 F (36.7 C)  TempSrc:  Oral Oral Oral  Resp: 20 20 20 20   Height:      Weight:  102.195  kg (225 lb 4.8 oz)    SpO2: 100%  93% 95% 96%    Intake/Output Summary (Last 24 hours) at 01/08/12 1234 Last data filed at 01/08/12 0900  Gross per 24 hour  Intake    310 ml  Output    150 ml  Net    160 ml   Filed Weights   01/05/12 2108 01/06/12 2139 01/07/12 2121  Weight: 103.284 kg (227 lb 11.2 oz) 102.195 kg (225 lb 4.8 oz) 102.195 kg (225 lb 4.8 oz)    Exam:   General:  Somnolent, disoriented, dry mucous membranes.  Cardiovascular: S1 and S2, no rubs or gallops  Respiratory: Decreased breath sounds at the bases, otherwise CTA bilaterally  Abdomen: Obese, soft, nontender, no rebound, positive BS; distended  Neuro: no focal or weakness. Moving all extremities grossly. Follow simple commands.  Data Reviewed: Basic Metabolic Panel:  Lab [id number] 01/07/12 0710 01/06/12 0900 01/05/12 2140 01/05/12 0555 01/04/12 0700  NA 149* 147* 144 -- 143 140  K 4.4 4.2 4.0 -- 4.3 3.6  CL 115* 115* 113* -- 115* 112  CO2 21 20 20  -- 21 20  GLUCOSE 141* 164* 140* -- 188* 124*  BUN 18 16 16  -- 22 28*  CREATININE 1.34 1.32 1.26 -- 1.53* 1.65*  CALCIUM 9.9 9.9 10.4 -- 9.5 9.3  MG -- -- -- 1.9 -- --  PHOS -- -- -- -- -- --   Liver Function Tests:  Lab 01/06/12 0900  AST 40*  ALT 35  ALKPHOS 86  BILITOT 0.7  PROT 6.1  ALBUMIN 2.7*    Lab 01/06/12 1515  LIPASE 24  AMYLASE --   CBC:  Lab 01/04/12 0700 01/03/12 0700 01/02/12 0640  WBC 10.2 10.9* 12.1*  NEUTROABS -- -- --  HGB 14.6 15.2 15.7  HCT 43.1 44.5 46.4  MCV 82.7 81.8 84.2  PLT 127* 112* 140*   CBG:  Lab 01/08/12 0737 01/07/12 2127 01/07/12 1636 01/07/12 1202 01/07/12 0740  GLUCAP 142* 88 149* 167* 164*    Recent Results (from the past 240 hour(s))  CLOSTRIDIUM DIFFICILE BY PCR     Status: Normal   Collection Time   12/30/11  9:13 PM      Component Value Range Status Comment   C difficile by pcr NEGATIVE  NEGATIVE Final   STOOL CULTURE     Status: Normal (Preliminary result)   Collection Time   12/31/11  3:16 PM      Component Value  Range Status Comment   Specimen Description STOOL   Final    Special Requests NONE   Final    Culture     Final    Value: Positive for Shiga toxin 2     NO SALMONELLA, SHIGELLA, CAMPYLOBACTER, OR YERSINIA ISOLATED     Note: CRITICAL RESULT CALLED TO, READ BACK BY AND VERIFIED WITH: CHRISTIE S@0747  ON [id number] BY [place] REFERRED TO [facility] LABORATORY IN [place] IDENTIFICATION/CONFIRMATION   Report Status PENDING   Incomplete      Studies: Ct Head Wo Contrast  01/08/2012  *RADIOLOGY REPORT*  Clinical Data: Altered mental status.  Confusion.  Colitis. Diabetic hypertensive patient with chronic renal disease.  CT HEAD WITHOUT CONTRAST  Technique:  Contiguous axial images were obtained from the base of the skull through the vertex without contrast.  Comparison: None.  Findings: Mild motion degradation.  No intracranial hemorrhage.  Small vessel disease type changes without CT evidence of large acute infarct.  No intracranial mass lesion detected on this unenhanced exam.  Mild atrophy without hydrocephalus.  Minimal mucosal thickening paranasal sinuses.  IMPRESSION: No intracranial hemorrhage or CT evidence of large acute infarct.  Evaluation slightly limited by motion.  Please see above   Original Report Authenticated By: Genia Del, [name]     Scheduled Meds:    . aspirin  81 mg Oral Daily  . cinacalcet  30 mg Oral Custom  . erythromycin   Left Eye Q6H  . feeding supplement  237 mL Oral TID BM  . hydrALAZINE  25 mg Oral BID  . insulin aspart  0-15 Units Subcutaneous TID WC  . insulin aspart protamine-insulin aspart  10 Units Subcutaneous BID WC  . isosorbide mononitrate  15 mg Oral Daily  . risperiDONE  0.5 mg Oral BID  . sodium bicarbonate  650 mg Oral BID   Continuous Infusions:    . dextrose 50 mL (01/08/12 0907)    Time spent: 45minutes    [facility] Pager [phone]. If 8PM-8AM, please contact night-coverage at  [email or web address], password [facility] 01/08/2012, 12:34 PM  LOS: 9 days

## 2012-01-08 NOTE — Progress Notes (Signed)
Clinical Social Work  CSW received psych referral due to patient's confusion. CSW attempted to meet with patient but patient unable to participate in assessment. CSW called contact (dtr-Lynne) and left a message with CSW contact information. CSW will continue to follow.  [place], [place] [phone]

## 2012-01-08 NOTE — Progress Notes (Signed)
Pt very restless and agitated, fussing and cussing at family pulling off telemetry leads and IV lines. Refusing to keep gown on or take meds. Family requesting something to help him sleep. Notified Forrest Moron, NP orders received. Will continue to monitor. 0400 Pt continue to pull at tubes and lines, safety mitts applied

## 2012-01-08 NOTE — Progress Notes (Signed)
  Echocardiogram 2D Echocardiogram has been performed.  Mauricio Po 01/08/2012, 4:40 PM

## 2012-01-08 NOTE — Progress Notes (Signed)
Chart review complete.  Patient is not eligible for [facility] Care Management services because his admitting diagnosis does not meet [facility] admission criteria.  In addition, he has not demonstrated a frequent readmission history.  For any additional questions or new referrals please contact [facility] Liaison at [phone]

## 2012-01-08 NOTE — Consult Note (Signed)
Patient Identification:  Damon [name] Date of Evaluation:  01/08/2012 Reason for Consult: Confusion  Referring Provider:  Dr. Inis Sizer  History of Present Illness:Pt is admitted for sx of bright red blood per rectum; no recent colonoscopy.  He had consented to surgery; then presented with confusion with change from 1st degree heart block to AFib and new onset disorientation, confusion, combativeness and was admitted to observation.   Past Psychiatric History: None provided Past Medical History:     Past Medical History  Diagnosis Date  . Bradycardia   . Diabetes mellitus   . Hypertension   . Chronic kidney disease     BAseline creatinine 1.4 to 1.5  . Arrhythmia     Baseline bradycardia  . Hard of hearing        Past Surgical History  Procedure Date  . Tonsillectomy   . Colonoscopy 01/01/2012    Procedure: COLONOSCOPY;  Surgeon: [name], MD;  Location: [facility] ENDOSCOPY;  Service: Endoscopy;  Laterality: N/A;    Allergies:  Allergies  Allergen Reactions  . Penicillins Other (See Comments)    Reaction as a child    Current Medications:  Prior to Admission medications   Medication Sig Start Date End Date Taking? Authorizing Provider  aspirin 81 MG tablet Take 81 mg by mouth daily.     Yes Historical Provider, MD  cinacalcet (SENSIPAR) 30 MG tablet Take 30 mg by mouth 2 (two) times a week. Takes on Wed and Sun   Yes Historical Provider, MD  furosemide (LASIX) 40 MG tablet Take 40 mg by mouth every other day.    Yes Historical Provider, MD  gemfibrozil (LOPID) 600 MG tablet Take 600 mg by mouth 2 (two) times daily before a meal.   Yes Historical Provider, MD  insulin NPH-insulin regular (HUMULIN 70/30) (70-30) 100 UNIT/ML injection Inject 40-50 Units into the skin 2 (two) times daily with a meal. Takes 50 units in the am & 40 units in the pm   Yes Historical Provider, MD  lisinopril (PRINIVIL,ZESTRIL) 20 MG tablet Take 20 mg by mouth daily.   Yes Historical Provider, MD     Social History:    reports that he has never smoked. His smokeless tobacco use includes Chew. He reports that he does not drink alcohol or use illicit drugs.   Family History:    Family History  Problem Relation Age of Onset  . Diabetes type II Mother   . Hypertension Mother   . Diabetes type II Father   . Hypertension Father     Mental Status Examination/Evaluation: Objective:  Appearance: sleeping, unresponsive to name, does not wake up  Eye Contact::    pending   Speech:  pending   Volume:  pending   Mood:  pending   Affect:  pending   Thought Process:   pending   Orientation:   pending   Thought Content:  pending   Suicidal Thoughts:  pending   Homicidal Thoughts:  pending   Judgement:  pending   Insight:  pending    DIAGNOSIS:   AXIS I Confusion R/O dementia, deliirium   AXIS II  Deferred  AXIS III See medical notes.  AXIS IV  pending   AXIS V pending    Assessment/Plan:  Notified Dr. Inis Sizer that pt is sleeping, has had medication. Will continue to see pt in am RECOMMENDATION:  1.  Will evaluate in am  Jaqulyn Chancellor MD 01/08/2012 12:51 PM

## 2012-01-09 ENCOUNTER — Inpatient Hospital Stay (HOSPITAL_COMMUNITY): Payer: Medicare Other

## 2012-01-09 LAB — BASIC METABOLIC PANEL
BUN: 16 mg/dL (ref 6–23)
CO2: 24 mEq/L (ref 19–32)
Calcium: 10 mg/dL (ref 8.4–10.5)
Chloride: 114 mEq/L — ABNORMAL HIGH (ref 96–112)
Creatinine, Ser: 1.33 mg/dL (ref 0.50–1.35)
GFR calc Af Amer: 58 mL/min — ABNORMAL LOW (ref >90)
GFR calc non Af Amer: 50 mL/min — ABNORMAL LOW (ref >90)
Glucose, Bld: 143 mg/dL — ABNORMAL HIGH (ref 70–99)
Potassium: 3.3 mEq/L — ABNORMAL LOW (ref 3.5–5.1)
Sodium: 148 mEq/L — ABNORMAL HIGH (ref 135–145)

## 2012-01-09 LAB — GLUCOSE, CAPILLARY
Glucose-Capillary: 131 mg/dL — ABNORMAL HIGH (ref 70–99)
Glucose-Capillary: 153 mg/dL — ABNORMAL HIGH (ref 70–99)
Glucose-Capillary: 148 mg/dL — ABNORMAL HIGH (ref 70–99)
Glucose-Capillary: 164 mg/dL — ABNORMAL HIGH (ref 70–99)
Glucose-Capillary: 232 mg/dL — ABNORMAL HIGH (ref 70–99)

## 2012-01-09 LAB — CK: Total CK: 139 U/L (ref 7–232)

## 2012-01-09 LAB — FOLATE RBC: RBC Folate: 785 ng/mL — ABNORMAL HIGH (ref >=366)

## 2012-01-09 MED ORDER — ISOSORBIDE MONONITRATE ER 30 MG PO TB24
30.0000 mg | ORAL_TABLET | Freq: Every day | ORAL | Status: DC
  Administered 2012-01-10 – 2012-01-15 (×5): 30 mg via ORAL
  Filled 2012-01-09 (×6): qty 1

## 2012-01-09 NOTE — Consult Note (Signed)
Patient Identification:  Damon [name] Date of Evaluation:  01/09/2012 Reason for Consult:  Confusion  Referring Provider: Dr. Broadus John  History of Present Illness: Pt had presented for colonoscopy for GI Bleed. He had gone to [place] and he continued to have blood in stool.  His daughter brought him to ED and he was admitted.  He was admitted alert and oriented   Past Psychiatric History:not given  Daughter reports loss of short term memory, especially after wife died and he moved in with family   Past Medical History:     Past Medical History  Diagnosis Date  . Bradycardia   . Diabetes mellitus   . Hypertension   . Chronic kidney disease     BAseline creatinine 1.4 to 1.5  . Arrhythmia     Baseline bradycardia  . Hard of hearing        Past Surgical History  Procedure Date  . Tonsillectomy   . Colonoscopy 01/01/2012    Procedure: COLONOSCOPY;  Surgeon: [name], MD;  Location: [facility] ENDOSCOPY;  Service: Endoscopy;  Laterality: N/A;    Allergies:  Allergies  Allergen Reactions  . Penicillins Other (See Comments)    Reaction as a child    Current Medications:  Prior to Admission medications   Medication Sig Start Date End Date Taking? Authorizing Provider  aspirin 81 MG tablet Take 81 mg by mouth daily.     Yes Historical Provider, MD  cinacalcet (SENSIPAR) 30 MG tablet Take 30 mg by mouth 2 (two) times a week. Takes on Wed and Sun   Yes Historical Provider, MD  furosemide (LASIX) 40 MG tablet Take 40 mg by mouth every other day.    Yes Historical Provider, MD  gemfibrozil (LOPID) 600 MG tablet Take 600 mg by mouth 2 (two) times daily before a meal.   Yes Historical Provider, MD  insulin NPH-insulin regular (HUMULIN 70/30) (70-30) 100 UNIT/ML injection Inject 40-50 Units into the skin 2 (two) times daily with a meal. Takes 50 units in the am & 40 units in the pm   Yes Historical Provider, MD  lisinopril (PRINIVIL,ZESTRIL) 20 MG tablet Take 20 mg by mouth daily.    Yes Historical Provider, MD    Social History:    reports that he has never smoked. His smokeless tobacco use includes Chew. He reports that he does not drink alcohol or use illicit drugs.   Family History:    Family History  Problem Relation Age of Onset  . Diabetes type II Mother   . Hypertension Mother   . Diabetes type II Father   . Hypertension Father     Mental Status Examination/Evaluation: Objective:  Appearance: pending  Eye Contact::  pending  Speech:  pending  Volume:  pending  Mood:  pending  Affect:  pending  Thought Process: pending  Orientation:  pending  Thought Content:  pending  Suicidal Thoughts:  pending  Homicidal Thoughts:  pending  Judgement:pending  Insight:  pending   DIAGNOSIS:   AXIS I  Dementia   AXIS II  Deferred  AXIS III See medical notes.  AXIS IV other psychosocial or environmental problems, problems related to social environment and medical complications and progressive loss of short term meory  AXIS V 51-60 moderate symptoms   Assessment/Plan:  Discussed with RN Izora Gala, 6700 desk asst. Pt is sleeping.  He does not respond to sternal rub, calling name, washing face repeated times - by RN Izora Gala.  Assessment cannot be conducted.  RECOMMENDATION:  1.  Will follow pt.  2.  Left message at desk for family members to call MD. 3.  Will follow pt.  Robyne Matar MD 01/09/2012 10:56 AM

## 2012-01-09 NOTE — Procedures (Signed)
Objective Swallowing Evaluation: Modified Barium Swallowing Study  Patient Details  Name: Damon [name] MRN: [id number] Date of Birth: 1933-05-17  Today's Date: 01/09/2012 Time: 1340-1400 SLP Time Calculation (min): 20 min  Past Medical History:  Past Medical History  Diagnosis Date  . Bradycardia   . Diabetes mellitus   . Hypertension   . Chronic kidney disease     BAseline creatinine 1.4 to 1.5  . Arrhythmia     Baseline bradycardia  . Hard of hearing    Past Surgical History:  Past Surgical History  Procedure Date  . Tonsillectomy   . Colonoscopy 01/01/2012    Procedure: COLONOSCOPY;  Surgeon: [name], MD;  Location: [facility] ENDOSCOPY;  Service: Endoscopy;  Laterality: N/A;   HPI:  78 year old who developed severe diarrhea. He has some mild abdominal pain. Later that day he started having several episodes of bright red blood per rectum. They went to [facility] and waited in the ER for 5-6 hours. During the wait the patient's bleeding stopped. He had not been seen, he decided to go home. His diarrhea did continue both but with no further bleeding. Admitted today after continuing to feel poorly with poor appetite.  Pt. diagnosed with GI bleed.  PMH: bradycardia, DM, HTN, chronic kidney disease.  Pt. has developed delirium and was eating/drinking without difficulty here at the hospital until 2 days ago when family reported he "just can't swallow."        Assessment / Plan / Recommendation Clinical Impression  Dysphagia Diagnosis: Moderate oral phase dysphagia;Severe oral phase dysphagia;Mild pharyngeal phase dysphagia Clinical impression: Pt. exhibits mod-severe oral and mild pharyngeal dysphagia likely due to decreased mental status/delerium/over medication (?) resulting in oral transit delays and delayed swallow initiation to valleculae.  Pt. was moderately alert and exhibited a decreased sense of overall awareness of self and surroundings.  He only  transited and initiated a swallow with one tsp sip thin barium and one bite applesauce and expectorated bolus of puree.  He is presently at increased aspiration risk with current cognitive state and recommend NPO except vital meds crushed if applesauce if pt. Alert and aware.  If pt. becomes alert and is able, RN can attempt crushed meds (if necessary) and SLP will see at bedside next date.     Treatment Recommendation  Therapy as outlined in treatment plan below    Diet Recommendation NPO except meds   Medication Administration: Crushed with puree    Other  Recommendations Oral Care Recommendations: Oral care BID   Follow Up Recommendations   (to be determined)    Frequency and Duration min 1 x/week  1 week       SLP Swallow Goals Goal #3: Pt. will maintain alertness and awarenss to timely transit bolus and initiate swallow with minimal s/s aspiration with mod verbal/visual cues.      Reason for Referral Objectively evaluate swallowing function   Oral Phase Oral Preparation/Oral Phase Oral Phase: Impaired Oral - Thin Oral - Thin Teaspoon: Right anterior bolus loss;Weak lingual manipulation;Delayed oral transit (sublingual residue) Oral - Solids Oral - Puree: Delayed oral transit;Weak lingual manipulation;Reduced posterior propulsion   Pharyngeal Phase Pharyngeal Phase Pharyngeal Phase: Impaired Pharyngeal - Thin Pharyngeal - Thin Cup: Delayed swallow initiation;Premature spillage to valleculae Pharyngeal - Solids Pharyngeal - Puree: Delayed swallow initiation;Premature spillage to valleculae  Cervical Esophageal Phase    GO    Cervical Esophageal Phase Cervical Esophageal Phase: [facility]

## 2012-01-09 NOTE — Evaluation (Addendum)
Clinical/Bedside Swallow Evaluation Patient Details  Name: Damon [name] MRN: [id number] Date of Birth: April 24, 1933  Today's Date: 01/09/2012 Time: [id number] SLP Time Calculation (min): 20 min  Past Medical History:  Past Medical History  Diagnosis Date  . Bradycardia   . Diabetes mellitus   . Hypertension   . Chronic kidney disease     BAseline creatinine 1.4 to 1.5  . Arrhythmia     Baseline bradycardia  . Hard of hearing    Past Surgical History:  Past Surgical History  Procedure Date  . Tonsillectomy   . Colonoscopy 01/01/2012    Procedure: COLONOSCOPY;  Surgeon: [name], MD;  Location: [facility] ENDOSCOPY;  Service: Endoscopy;  Laterality: N/A;   HPI:  78 year old who developed severe diarrhea. He has some mild abdominal pain. Later that day he started having several episodes of bright red blood per rectum. They went to [facility] and waited in the ER for 5-6 hours. During the wait the patient's bleeding stopped. He had not been seen, he decided to go home. His diarrhea did continue both but with no further bleeding. Admitted today after continuing to feel poorly with poor appetite.  Pt. diagnosed with GI bleed.  PMH: bradycardia, DM, HTN, chronic kidney disease.  Pt. has developed delirium and was eating/drinking without difficulty here at the hospital until 2 days ago when family reported he "just can't swallow."      Assessment / Plan / Recommendation Clinical Impression  Pt. awake and cooperative, however required max verbal/visual cues throughout assesment.  Exhibited moderate oral deficits with decreased labial seal leading to mild right anterior leakage.  Delayed oral transit to posterior oral cavity.  Pharyngeal impairments included decreased laryngeal elevation and coordination, muliple swallows, with suspected impaired pharyngeal contraction.  No coughing present, however pt. would benefit from an objective evaluation to assess swallow fucntion and  recommend diet/liquid.  MBS scheduled today at 1330.     Aspiration Risk  Moderate    Diet Recommendation NPO        Other  Recommendations Recommended Consults: MBS   Follow Up Recommendations   (to be determined)    Frequency and Duration               Swallow Study Prior Functional Status       General HPI: 78 year old who developed severe diarrhea. He has some mild abdominal pain. Later that day he started having several episodes of bright red blood per rectum. They went to [facility] and waited in the ER for 5-6 hours. During the wait the patient's bleeding stopped. He had not been seen, he decided to go home. His diarrhea did continue both but with no further bleeding. Admitted today after continuing to feel poorly with poor appetite.  Pt. diagnosed with GI bleed.  PMH: bradycardia, DM, HTN, chronic kidney disease.  Pt. has developed delirium and was eating/drinking without difficulty here at the hospital until 2 days ago when family reported he "just can't swallow."    Type of Study: Bedside swallow evaluation Diet Prior to this Study: Regular;Thin liquids Temperature Spikes Noted: No Respiratory Status: Room air History of Recent Intubation: No Behavior/Cognition: Requires cueing;Confused Oral Cavity - Dentition: Edentulous Self-Feeding Abilities: Total assist Patient Positioning: Upright in bed Baseline Vocal Quality: Hoarse;Low vocal intensity Volitional Cough: Cognitively unable to elicit Volitional Swallow: Unable to elicit    Oral/Motor/Sensory Function Overall Oral Motor/Sensory Function:  (generalized weakness)   Ice Chips Ice chips: Impaired Presentation: [employer]  Oral Phase Impairments: Reduced labial seal;Reduced lingual movement/coordination;Impaired anterior to posterior transit Oral Phase Functional Implications: Prolonged oral transit Pharyngeal Phase Impairments: Decreased hyoid-laryngeal movement;Suspected delayed Swallow   Thin Liquid  Thin Liquid: Impaired Presentation: Cup;Spoon Oral Phase Impairments: Reduced labial seal;Reduced lingual movement/coordination;Impaired anterior to posterior transit Oral Phase Functional Implications: Right anterior spillage Pharyngeal  Phase Impairments: Suspected delayed Swallow;Decreased hyoid-laryngeal movement;Multiple swallows    Nectar Thick Nectar Thick Liquid: Not tested   Honey Thick Honey Thick Liquid: Not tested   Puree Puree: Impaired Presentation: Spoon Oral Phase Impairments: Reduced labial seal;Reduced lingual movement/coordination;Impaired anterior to posterior transit Oral Phase Functional Implications: Prolonged oral transit Pharyngeal Phase Impairments: Decreased hyoid-laryngeal movement;Multiple swallows   Solid       Solid: Not tested       Houston Siren M.Ed [employer] [phone]  01/09/2012

## 2012-01-09 NOTE — Telephone Encounter (Signed)
Returned [employer] and spoke with her.

## 2012-01-09 NOTE — Progress Notes (Signed)
Physical Therapy Treatment Patient Details Name: ONEAL COLLASO MRN: [id number] DOB: 09-15-1933 Today's Date: 01/09/2012 Time: 1000-1030 PT Time Calculation (min): 30 min  PT Assessment / Plan / Recommendation Comments on Treatment Session  Pt presents with stiffness throughout all extremities today, RLE more profound than left, and very little active muscular activation. Performed sit to stand 3x with +2 assist but pt unable to transfer to chair or ambulate. Recmomend lift for OOB. PT will continue to follow.    Follow Up Recommendations  CIR;Supervision/Assistance - 24 hour;Supervision for mobility/OOB     Does the patient have the potential to tolerate intense rehabilitation     Barriers to Discharge        Equipment Recommendations  Rolling walker with 5" wheels    Recommendations for Other Services Rehab consult  Frequency Min 3X/week   Plan Discharge plan remains appropriate;Frequency remains appropriate    Precautions / Restrictions Precautions Precautions: Fall Restrictions Weight Bearing Restrictions: No   Pertinent Vitals/Pain No c/o pain    Mobility  Bed Mobility Bed Mobility: Rolling Right;Right Sidelying to Sit;Sitting - Scoot to [employer] of Bed;Sit to Sidelying Right;Scooting to [facility] Rolling Right: 1: +2 Total assist Rolling Right: Patient Percentage: 20% Right Sidelying to Sit: 1: +2 Total assist Right Sidelying to Sit: Patient Percentage: 20% Sitting - Scoot to Edge of Bed: 2: Max assist Sit to Sidelying Right: 1: +2 Total assist Sit to Sidelying Right: Patient Percentage: 0% Scooting to HOB: 1: +2 Total assist Scooting to [facility]: Patient Percentage: 0% Details for Bed Mobility Assistance: pt very rigid throughout all extremities but right LE worse than left. Pt attempting to assist with bed mobility but minimal active mvmt of extremities. vc's for all sequencing Transfers Transfers: Sit to Stand;Stand to Sit Sit to Stand: 1: +2 Total assist;From bed Sit to  Stand: Patient Percentage: 20% Stand to Sit: 1: +2 Total assist;To bed Stand to Sit: Patient Percentage: 20% Details for Transfer Assistance: pt lifting right foot off floor with sit to stand, when right leg stabilized, then began to lift left leg off floor in standing. Only able to clear bed by 2-3 inches. Performed transfer 3x.  Ambulation/Gait Ambulation/Gait Assistance: Not tested (comment) (unable) Stairs: No Wheelchair Mobility Wheelchair Mobility: No    Exercises     PT Diagnosis:    PT Problem List:   PT Treatment Interventions:     PT Goals Acute Rehab PT Goals PT Goal Formulation: With patient Time For Goal Achievement: 01/11/12 Potential to Achieve Goals: Good Pt will go Supine/Side to Sit: with supervision PT Goal: Supine/Side to Sit - Progress: Not progressing (note: lack of progress today likely due to med effects) Pt will go Sit to Supine/Side: with supervision PT Goal: Sit to Supine/Side - Progress: Not progressing Pt will go Sit to Stand: with supervision PT Goal: Sit to Stand - Progress: Not progressing Pt will go Stand to Sit: with supervision PT Goal: Stand to Sit - Progress: Not progressing Pt will Transfer Bed to Chair/Chair to Bed: with supervision PT Transfer Goal: Bed to Chair/Chair to Bed - Progress: Not progressing Pt will Ambulate: >150 feet;with least restrictive assistive device PT Goal: Ambulate - Progress: Not progressing  Visit Information  Last PT Received On: 01/09/12 Assistance Needed: +2 PT/OT Co-Evaluation/Treatment: Yes    Subjective Data  Subjective: pt very lethargic and delayed with all responses, physically and verbally Patient Stated Goal: none stated   Cognition  Overall Cognitive Status: Impaired Area of Impairment: Attention;Following commands;Safety/judgement;Problem solving  Arousal/Alertness: Lethargic Orientation Level: Situation;Time;Disoriented to Behavior During Session: Lethargic Current Attention Level:  Focused Attention - Other Comments: pt nodding off when not being actively stimulated Following Commands: Follows one step commands inconsistently Problem Solving: inability to problem solve and decreased insight into self and deficits Cognition - Other Comments: very slow to process questions/commands    Balance  Balance Balance Assessed: Yes Static Sitting Balance Static Sitting - Balance Support: Bilateral upper extremity supported;Feet supported Static Sitting - Level of Assistance: 5: Stand by assistance  End of Session PT - End of Session Equipment Utilized During Treatment: Gait belt Activity Tolerance: Patient limited by fatigue;Other (comment);Treatment limited secondary to medication (limited by rigidity) Patient left: in bed;with call bell/phone within reach;with family/visitor present Nurse Communication: Mobility status   GP   Leighton Roach, [place]  [place], [place] 01/09/2012, 10:46 AM

## 2012-01-09 NOTE — Progress Notes (Signed)
Occupational Therapy Treatment Patient Details Name: Damon [name] MRN: [id number] DOB: [birth year] Today's Date: 01/09/2012 Time: [id number] OT Time Calculation (min): 24 min  OT Assessment / Plan / Recommendation Comments on Treatment Session Pt currently requiring increased assist for ADL's and self care tasks since last session. Sit to stand transfers attempted x3 in preparation for increased participation in ADL's and eslf care tasks. Increased tone RLE > LLE noted, difficulty following commands etc. MD present states that increased tone may be related to medication.     Follow Up Recommendations  SNF;CIR    Barriers to Discharge       Equipment Recommendations  3 in 1 bedside comode    Recommendations for Other Services    Frequency Min 2X/week   Plan Discharge plan remains appropriate    Precautions / Restrictions Precautions Precautions: Fall Restrictions Weight Bearing Restrictions: No   Pertinent Vitals/Pain No c/o pain    ADL  Grooming: Performed;Wash/dry hands;Wash/dry face;Set up;Supervision/safety;Other (comment) (Increased time, pt very lethargic.) Where Assessed - Grooming: Supine, head of bed up Upper Body Dressing: Performed;Moderate assistance Where Assessed - Upper Body Dressing: Unsupported sitting Lower Body Dressing: Performed;+1 Total assistance Where Assessed - Lower Body Dressing: Unsupported sitting Toilet Transfer: Other (comment);Simulated;+2 Total assistance (Attempted sit-stand x2, pt unable secondary increase tone LE) Toilet Transfer: Patient Percentage: 20% Toilet Transfer Method: Sit to stand;Other (comment) (MD aware & ?tone is medication related) Transfers/Ambulation Related to ADLs: Attempted sit-stand x3 w/ +2 total assist from EOB, pt w/ difficulty WB through RLE w/ leg/foot blocked secondary to increased tone. MD present and states that this may be mediaction related. Sit EOB approx 61min ADL Comments: Pt currently requiring increased  assist for ADL's amd self care tasks since last session. Sit to stand transfers attempted x3 in preparation for increased participation in ADL's.    OT Diagnosis:    OT Problem List:   OT Treatment Interventions:     OT Goals ADL Goals ADL Goal: Grooming - Progress: Not progressing ADL Goal: Upper Body Dressing - Progress: Not progressing ADL Goal: Lower Body Dressing - Progress: Not progressing ADL Goal: Toilet Transfer - Progress: Not progressing  Visit Information  Last OT Received On: 01/09/12 Assistance Needed: +2    Subjective Data  Subjective: Pt in bed, lethargic, family present Patient Stated Goal: None stated   Prior Functioning       Cognition  Overall Cognitive Status: Impaired Area of Impairment: Attention;Following commands;Safety/judgement;Problem solving Arousal/Alertness: Lethargic Orientation Level: Situation;Time;Disoriented to Behavior During Session: Lethargic Current Attention Level: Focused Attention - Other Comments: pt nodding off when not being actively stimulated Following Commands: Follows one step commands inconsistently Problem Solving: inability to problem solve and decreased insight into self and deficits Cognition - Other Comments: very slow to process questions/commands    Mobility  Shoulder Instructions Bed Mobility Bed Mobility: Rolling Right;Right Sidelying to Sit;Sitting - Scoot to Damon [name] of Bed;Sit to Sidelying Right;Scooting to Damon [name] Rolling Right: 1: +2 Total assist Rolling Right: Patient Percentage: 20% Right Sidelying to Sit: 1: +2 Total assist Right Sidelying to Sit: Patient Percentage: 20% Sitting - Scoot to Edge of Bed: 2: Max assist Sit to Sidelying Right: 1: +2 Total assist Sit to Sidelying Right: Patient Percentage: 0% Scooting to HOB: 1: +2 Total assist Scooting to Damon [name]: Patient Percentage: 0% Details for Bed Mobility Assistance: pt very rigid throughout all extremities but right LE worse than left. Pt attempting to assist  with bed mobility but minimal active mvmt of extremities. vc's  for all sequencing Transfers Sit to Stand: 1: +2 Total assist;From bed Sit to Stand: Patient Percentage: 20% Stand to Sit: 1: +2 Total assist;To bed Stand to Sit: Patient Percentage: 20% Details for Transfer Assistance: pt lifting right foot off floor with sit to stand, when right leg stabilized, then began to lift left leg off floor in standing. Only able to clear bed by 2-3 inches. Performed transfer 3x.              Balance Balance Balance Assessed: Yes Static Sitting Balance Static Sitting - Balance Support: Bilateral upper extremity supported Static Sitting - Level of Assistance: 5: Stand by assistance Static Standing Balance Static Standing - Balance Support: Right upper extremity supported;Left upper extremity supported   End of Session OT - End of Session Equipment Utilized During Treatment: Gait belt Activity Tolerance: Patient limited by fatigue Patient left: in bed;with call bell/phone within reach;with family/visitor present;Other (comment) (MD present)  GO     Damon [name], Damon [name]  Damon [name] 01/09/2012, 11:33 AM

## 2012-01-09 NOTE — Progress Notes (Signed)
[facility] PROGRESS NOTE  VINNY COLEE [id number] DOB: 19-Mar-1933 DOA: 12/30/2011 PCP: Arnette Norris, MD  Assessment/Plan:  1. Infectious colitis: Shiga toxin  and bright red blood in stool: Status post colonoscopy on 12/10 which was consistent with infectious etiology, t final report 12/12 states shiga toxin positive but Shigella and Salmonella negative. patient on Rocephin started on 12/11, Dr.Viyouh consulted ID, discussed patient with Dr. Drucilla Schmidt and he states are other bacteria like Escherichia coli it produce Shiga toxin besides Shigella, he recommended discontinuing the Rocephin and this was done. Has been managed supportively, No further diarrhea reported, but still poor mouth intake.  2.  Hypernatremia: Secondary to dehydration, from poor by mouth intake as above. The diarrhea is resolved. Cautious IV hypotonic fluids follow, by mouth was encouraged. 3. Acute on chronic renal failure: Resolved, was secondary to dehydration, hypotension and continue use of ACE. Continue holding ACE.  4. DM with hypoglycemic episodes:  no further hypoglycemic episodes on the decreased  70/30, continue SSI. On current IV fluids A1C 6.8 5. Hypertriglyceridemia: Resume gemfibrozil after creatinine < 2; follow heart healthy low fat diet 6. Metabolic acidosis: Resolved, 2/2 diarrhea, dehydration and acute on chronic kidney disease. Resolved with hydration,  7.  Deconditioning: PT OT recommending skilled nursing, social work consulted to assist with placement 8. Mild ascites: Per ultrasound, and discussed with radiologist on 12/14 and amount of fluid very small and therapeutic paracentesis would not make a difference.albumin 2.7 and LFTs wnl. No further Lasix as hypernatremic, gentle hydration now resumed due to hypernatremia 9.AMS: suspect mild underlying cognitive dysfunction vs early dementia, upon discussion with daughter for 1-2 months prior he was getting his morning and evening medications mixed up,  forgetting things, especially recent memory, now with possible sundowning/delirium. Per nsg staff pt has had some intermittent confusion. But beginning on pm of 12/14 was more confused and combative and so was started on risperidone. He initially appeared to improve on risperidone, but this am drowsy and with increased tone in lower extremities, will hold PM risperdal today due to lethargy, concern for EPS, check CPK, re-orientation techniques -mild Hypernatremia, not really that high to be contributing but doesn't help, continue D5W today, FU bmet in am. -CT scan of head ordered shows no acute intracranial findings -Consulted Dr. Evern Bio, await eval - B12, RPR normal 10. HTN: Better blood pressure control on hydralazine and imdur. continue holding ACEI 11.?Arrhythmia: pt with h/o arrhythmia and EKG 12/15 with undetermined rhythm, but the repeat EKG of 12/16 showed normal sinus- cardiac enzymes negative, still await 2-D echo for evaluation of EF.  12.Generalized weakness: secondary to dehydration/diarrhea, and deconditioning. PT OT recommending skilled nursing. 13. ?Dysphagia: Swallow evaluation, follow.   DVT prophylaxis: SCDs Code Status: Full Family Communication: Long discussion with Son and daughter at bedside Disposition Plan: SNF when medically stable   Consultants:  GI ([facility], Dr. Oletta Lamas)  ID-Dr Tommy Medal  Dr. Evern Bio- eval pending.  Antibiotics:  Flagyl 12/8>>12/11  Rocephin 12/11>>12/12  HPI/Subjective: -tired this am, physically weak, per PT increased tone in lower extremities, poor PO inatke. Objective: Filed Vitals:   01/08/12 1800 01/08/12 2107 01/09/12 0613 01/09/12 0939  BP: 155/80 170/80 165/92 169/74  Pulse: 98 97 82 81  Temp: 98.8 F (37.1 C) 97.1 F (36.2 C)  98.1 F (36.7 C)  TempSrc: Oral Oral Oral Oral  Resp: 20 20    Height:      Weight:  102.195 kg (225 lb 4.8 oz)    SpO2: 98% 98%  98%     Intake/Output Summary (Last 24 hours) at 01/09/12  1101 Last data filed at 01/09/12 0900  Gross per 24 hour  Intake    495 ml  Output    450 ml  Net     45 ml   Filed Weights   01/06/12 2139 01/07/12 2121 01/08/12 2107  Weight: 102.195 kg (225 lb 4.8 oz) 102.195 kg (225 lb 4.8 oz) 102.195 kg (225 lb 4.8 oz)    Exam:   General:  Somnolent, disoriented, dry mucous membranes.  Cardiovascular: S1 and S2, no rubs or gallops  Respiratory: Decreased breath sounds at the bases, otherwise CTA bilaterally  Abdomen: Obese, soft, nontender, no rebound, positive BS; distended  Neuro: no focal or weakness. Increased tone in both lower ext, R>L, moving all extremities grossly. Follow simple commands.  Data Reviewed: Basic Metabolic Panel:  Lab [id number] 01/08/12 0505 01/07/12 0710 01/06/12 0900 01/05/12 2140 01/05/12 0555  NA 148* 149* 147* 144 -- 143  K 3.3* 4.4 4.2 4.0 -- 4.3  CL 114* 115* 115* 113* -- 115*  CO2 24 21 20 20  -- 21  GLUCOSE 143* 141* 164* 140* -- 188*  BUN 16 18 16 16  -- 22  CREATININE 1.33 1.34 1.32 1.26 -- 1.53*  CALCIUM 10.0 9.9 9.9 10.4 -- 9.5  MG -- -- -- -- 1.9 --  PHOS -- -- -- -- -- --   Liver Function Tests:  Lab 01/06/12 0900  AST 40*  ALT 35  ALKPHOS 86  BILITOT 0.7  PROT 6.1  ALBUMIN 2.7*    Lab 01/06/12 1515  LIPASE 24  AMYLASE --   CBC:  Lab 01/04/12 0700 01/03/12 0700  WBC 10.2 10.9*  NEUTROABS -- --  HGB 14.6 15.2  HCT 43.1 44.5  MCV 82.7 81.8  PLT 127* 112*   CBG:  Lab 01/08/12 1655 01/08/12 1208 01/08/12 0737 01/07/12 2127 01/07/12 1636  GLUCAP 91 158* 142* 88 149*    Recent Results (from the past 240 hour(s))  CLOSTRIDIUM DIFFICILE BY PCR     Status: Normal   Collection Time   12/30/11  9:13 PM      Component Value Range Status Comment   C difficile by pcr NEGATIVE  NEGATIVE Final   STOOL CULTURE     Status: Normal (Preliminary result)   Collection Time   12/31/11  3:16 PM      Component Value Range Status Comment   Specimen Description STOOL   Final    Special  Requests NONE   Final    Culture     Final    Value: Positive for Shiga toxin 2     NO SALMONELLA, SHIGELLA, CAMPYLOBACTER, OR YERSINIA ISOLATED     Note: CRITICAL RESULT CALLED TO, READ BACK BY AND VERIFIED WITH: CHRISTIE S@[id number]  ON [id number] BY [place] REFERRED TO [facility] LABORATORY IN [place] IDENTIFICATION/CONFIRMATION   Report Status PENDING   Incomplete      Studies: Ct Head Wo Contrast  01/08/2012  *RADIOLOGY REPORT*  Clinical Data: Altered mental status.  Confusion.  Colitis. Diabetic hypertensive patient with chronic renal disease.  CT HEAD WITHOUT CONTRAST  Technique:  Contiguous axial images were obtained from the base of the skull through the vertex without contrast.  Comparison: None.  Findings: Mild motion degradation.  No intracranial hemorrhage.  Small vessel disease type changes without CT evidence of large acute infarct.  No intracranial mass lesion detected on this unenhanced exam.  Mild  atrophy without hydrocephalus.  Minimal mucosal thickening paranasal sinuses.  IMPRESSION: No intracranial hemorrhage or CT evidence of large acute infarct.  Evaluation slightly limited by motion.  Please see above   Original Report Authenticated By: Genia Del, [name]     Scheduled Meds:    . aspirin  81 mg Oral Daily  . cinacalcet  30 mg Oral Custom  . erythromycin   Left Eye Q6H  . feeding supplement  237 mL Oral TID BM  . feeding supplement  30 mL Oral BID  . hydrALAZINE  25 mg Oral BID  . insulin aspart  0-15 Units Subcutaneous TID WC  . insulin aspart protamine-insulin aspart  10 Units Subcutaneous BID WC  . isosorbide mononitrate  30 mg Oral Daily  . multivitamin with minerals  1 tablet Oral Daily  . risperiDONE  0.5 mg Oral QPM  . sodium bicarbonate  650 mg Oral BID   Continuous Infusions:    . dextrose 50 mL (01/08/12 0907)    Time spent: 26minutes    [facility]  [facility] Pager [phone]. If 8PM-8AM, please contact  night-coverage at [email or web address], password [facility] 01/09/2012, 11:01 AM  LOS: 10 days

## 2012-01-09 NOTE — Progress Notes (Signed)
Clinical Social Work Department CLINICAL SOCIAL WORK PSYCHIATRY SERVICE LINE ASSESSMENT 01/09/2012  Patient:  ZAMEIR WALTON Eimers  Account:  [id number]  Admit Date:  12/30/2011  Clinical Social Worker:  Sindy Messing, LCSW  Date/Time:  01/09/2012 02:30 PM Referred by:  Physician  Date referred:  01/09/2012 Reason for Referral  Behavioral Health Issues   Presenting Symptoms/Problems (In the person's/family's own words):   Patient unable to participate in assessment. Dtr reports consult requested due to confusion.   Abuse/Neglect/Trauma History (check all that apply)  Denies history   Abuse/Neglect/Trauma Comments:   Psychiatric History (check all that apply)  Denies history   Psychiatric medications:  None   Current Mental Health Hospitalizations/Previous Mental Health History:   Dtr reports no current or previous diagnosis of any mental health concerns.   Current provider:   PCP   Place and Date:   N/A   Current Medications:   acetaminophen, acetaminophen, alum & mag hydroxide-simeth, HYDROcodone-acetaminophen, ondansetron (ZOFRAN) IV, ondansetron, risperiDONE                        . aspirin  81 mg Oral Daily  . cinacalcet  30 mg Oral Custom  . erythromycin   Left Eye Q6H  . feeding supplement  237 mL Oral TID BM  . feeding supplement  30 mL Oral BID  . hydrALAZINE  25 mg Oral BID  . insulin aspart  0-15 Units Subcutaneous TID WC  . insulin aspart protamine-insulin aspart  10 Units Subcutaneous BID WC  . isosorbide mononitrate  30 mg Oral Daily  . multivitamin with minerals  1 tablet Oral Daily   Previous Impatient Admission/Date/Reason:   None reported   Emotional Health / Current Symptoms    Suicide/Self Harm  None reported   Suicide attempt in the past:   Other harmful behavior:   Psychotic/Dissociative Symptoms  Unable to accurately assess   Other Psychotic/Dissociative Symptoms:    Attention/Behavioral Symptoms  Unable to accurately assess   Other  Attention / Behavioral Symptoms:    Cognitive Impairment  Unable to accurately assess   Other Cognitive Impairment:    Mood and Adjustment  Lethargic    Stress, Anxiety, Trauma, Any Recent Loss/Stressor  Other - See comment   Anxiety (frequency):   Phobia (specify):   Compulsive behavior (specify):   Obsessive behavior (specify):   Other:   Patient's wife passed away about a year ago.   Substance Abuse/Use  None   SBIRT completed (please refer for detailed history):  N  Self-reported substance use:   Dtr reports no current use substance use and no history use.   Urinary Drug Screen Completed:  N Alcohol level:    Environmental/Housing/Living Arrangement  With Family Member   Who is in the home:   Lives with son   Emergency contact:  Lynn-dtr and Samik-son   Financial  Medicare   Patient's Strengths and Goals (patient's own words):   Patient has stable housing with son.   Clinical Social Worker's Interpretive Summary:   CSW received referral due to patient being confused. CSW attempted to meet with patient but patient too drowsy to participate in assessment. No family present at bedside. Per RN, patient has been drowsy all day.    CSW spoke with dtr [name]) via phone to gather more information on patient. Patient has been living with son for the past year. Son moved in with patient after patient's wife passed away. PTA, patient was completely independent at  home. Dtr reports that patient was not confused at home but had periods of short term memory loss. Dtr reports that patient's forgetfulness became worse after wife passed away. This Probation officer wonders if dtr has more interaction with patient now that he lives with brother and is now noticing more memory loss.    Dtr reports no MH or SA presently or in the past. Patient never diagnosed with dementia. Dtr reports no further information relevant to patient's confusion. CSW will continue to follow patient to assess when  appropriate.    CSW will staff case with psych MD.   Disposition:  Recommend Psych CSW continuing to support while in hospital

## 2012-01-10 DIAGNOSIS — E1149 Type 2 diabetes mellitus with other diabetic neurological complication: Secondary | ICD-10-CM

## 2012-01-10 LAB — BASIC METABOLIC PANEL
BUN: 20 mg/dL (ref 6–23)
CO2: 22 mEq/L (ref 19–32)
Calcium: 10 mg/dL (ref 8.4–10.5)
Chloride: 111 mEq/L (ref 96–112)
Creatinine, Ser: 1.23 mg/dL (ref 0.50–1.35)
GFR calc Af Amer: 64 mL/min — ABNORMAL LOW (ref >90)
GFR calc non Af Amer: 55 mL/min — ABNORMAL LOW (ref >90)
Glucose, Bld: 278 mg/dL — ABNORMAL HIGH (ref 70–99)
Potassium: 3.9 mEq/L (ref 3.5–5.1)
Sodium: 145 mEq/L (ref 135–145)

## 2012-01-10 LAB — GLUCOSE, CAPILLARY
Glucose-Capillary: 240 mg/dL — ABNORMAL HIGH (ref 70–99)
Glucose-Capillary: 224 mg/dL — ABNORMAL HIGH (ref 70–99)
Glucose-Capillary: 176 mg/dL — ABNORMAL HIGH (ref 70–99)
Glucose-Capillary: 202 mg/dL — ABNORMAL HIGH (ref 70–99)

## 2012-01-10 LAB — CBC
HCT: 39.1 % (ref 39.0–52.0)
Hemoglobin: 13.5 g/dL (ref 13.0–17.0)
MCH: 29 pg (ref 26.0–34.0)
MCHC: 34.5 g/dL (ref 30.0–36.0)
MCV: 83.9 fL (ref 78.0–100.0)
Platelets: ADEQUATE 10*3/uL (ref 150–400)
RBC: 4.66 MIL/uL (ref 4.22–5.81)
RDW: 16.7 % — ABNORMAL HIGH (ref 11.5–15.5)
WBC: 8.5 10*3/uL (ref 4.0–10.5)

## 2012-01-10 MED ORDER — HYDRALAZINE HCL 50 MG PO TABS
50.0000 mg | ORAL_TABLET | Freq: Three times a day (TID) | ORAL | Status: DC
  Administered 2012-01-10 – 2012-01-15 (×9): 50 mg via ORAL
  Filled 2012-01-10 (×17): qty 1

## 2012-01-10 MED ORDER — INSULIN ASPART PROT & ASPART (70-30 MIX) 100 UNIT/ML ~~LOC~~ SUSP
15.0000 [IU] | Freq: Two times a day (BID) | SUBCUTANEOUS | Status: DC
  Administered 2012-01-10 – 2012-01-11 (×3): 15 [IU] via SUBCUTANEOUS
  Filled 2012-01-10: qty 3

## 2012-01-10 NOTE — Progress Notes (Signed)
[facility] PROGRESS NOTE  Damon [name] [id number] DOB: February 25, 1933 DOA: 12/30/2011 PCP: Arnette Norris, MD  Assessment/Plan:  1. Infectious colitis: Shiga toxin  and bright red blood in stool: Status post colonoscopy on 12/10 which was consistent with infectious etiology, t final report 12/12 states shiga toxin positive but Shigella and Salmonella negative. patient on Rocephin started on 12/11, Dr.Viyouh consulted ID, discussed patient with Dr. Drucilla Schmidt and he states are other bacteria like Escherichia coli it produce Shiga toxin besides Shigella, he recommended discontinuing the Rocephin and this was done. Has been managed supportively, No further diarrhea reported, but still poor mouth intake. This has resolved 2.  Hypernatremia: Secondary to dehydration, from poor by mouth intake as above, improved, . The diarrhea is resolved. Cautious IV hypotonic fluids follow, by mouth was encouraged. 3. Acute on chronic renal failure: Resolved, was secondary to dehydration, hypotension and continue use of ACE. Continue holding ACE.  4. DM : CBG up with D5W, hopefully can stop IVF when he can resume PO, increase insulin  70/30, continue SSI. On current IV fluids A1C 6.8 5. Hypertriglyceridemia: Resume gemfibrozil after creatinine < 2; follow heart healthy low fat diet 6. Metabolic acidosis: Resolved, 2/2 diarrhea, dehydration and acute on chronic kidney disease. Resolved with hydration,  7.  Deconditioning: PT OT recommending skilled nursing, social work consulted to assist with placement 8. Mild ascites: Per ultrasound, and discussed with radiologist on 12/14 and amount of fluid very small and therapeutic paracentesis would not make a difference.albumin 2.7 and LFTs wnl. No further Lasix as hypernatremic, gentle hydration now resumed due to hypernatremia 9.AMS: suspect mild underlying cognitive dysfunction vs early dementia, upon discussion with daughter for 1-2 months prior he was getting his morning and  evening medications mixed up, forgetting things, especially recent memory, now with possible sundowning/delirium. Per nsg staff pt has had some intermittent confusion. But beginning on pm of 12/14 was more confused and combative and so was started on risperidone. He initially appeared to improve on risperidone, but yesterday was drowsy and with increased tone in lower extremities, hence held PM risperdal yesterday per family cognitively clearer today a risperdal on hold due to lethargy, concern for EPS, check CPK, re-orientation techniques -mild Hypernatremia, not really that high to be contributing but doesn't help, continue D5W today, FU bmet in am. -CT scan of head ordered shows no acute intracranial findings -Consulted Dr. Evern Bio, await eval - B12, RPR normal 10. HTN: Better blood pressure control on hydralazine and imdur. continue holding ACEI 11.?Arrhythmia: pt with h/o arrhythmia and EKG 12/15 with undetermined rhythm, but the repeat EKG of 12/16 showed normal sinus- cardiac enzymes negative, still await 2-D echo for evaluation of EF.  12.Generalized weakness: secondary to dehydration/diarrhea, and deconditioning. PT OT recommending skilled nursing. 13. ?Dysphagia: Swallow evaluation, follow., hopefully start diet today   DVT prophylaxis: SCDs Code Status: Full Family Communication: Long discussion with Son and daughter at bedside Disposition Plan: SNF when medically stable   Consultants:  GI ([facility], Dr. Oletta Lamas)  ID-Dr Tommy Medal  Dr. Evern Bio- eval pending.  Antibiotics:  Flagyl 12/8>>12/11  Rocephin 12/11>>12/12  HPI/Subjective: -tired this am, physically weak, per PT increased tone in lower extremities, poor PO inatke. Objective: Filed Vitals:   01/09/12 0939 01/09/12 2043 01/09/12 2321 01/10/12 0413  BP: 169/74 186/86  180/62  Pulse: 81 78  73  Temp: 98.1 F (36.7 C) 98.6 F (37 C)  98.1 F (36.7 C)  TempSrc: Oral Oral  Oral  Resp:  20  20  Height:  5\' 5"  (1.651 m)  5\' 5"  (1.651 m)   Weight:   99.3 kg (218 lb 14.7 oz)   SpO2:  100%  96%    Intake/Output Summary (Last 24 hours) at 01/10/12 1147 Last data filed at 01/10/12 0333  Gross per 24 hour  Intake      0 ml  Output    250 ml  Net   -250 ml   Filed Weights   01/07/12 2121 01/08/12 2107 01/09/12 2321  Weight: 102.195 kg (225 lb 4.8 oz) 102.195 kg (225 lb 4.8 oz) 99.3 kg (218 lb 14.7 oz)    Exam:   General:  More alert today,, answers some questions, oriented to self and partly to place, dry mucous membranes.  Cardiovascular: S1 and S2, no rubs or gallops  Respiratory: Decreased breath sounds at the bases, otherwise CTA bilaterally  Abdomen: Obese, soft, nontender, no rebound, positive BS; distended  Neuro: no focal or weakness. Increased tone in both lower ext, L>R, moving all extremities grossly. Follow simple commands.  Data Reviewed: Basic Metabolic Panel:  Lab [id number] 01/09/12 0522 01/08/12 0505 01/07/12 0710 01/06/12 0900 01/05/12 2140  NA 145 148* 149* 147* 144 --  K 3.9 3.3* 4.4 4.2 4.0 --  CL 111 114* 115* 115* 113* --  CO2 22 24 21 20 20  --  GLUCOSE 278* 143* 141* 164* 140* --  BUN 20 16 18 16 16  --  CREATININE 1.23 1.33 1.34 1.32 1.26 --  CALCIUM 10.0 10.0 9.9 9.9 10.4 --  MG -- -- -- -- -- 1.9  PHOS -- -- -- -- -- --   Liver Function Tests:  Lab 01/06/12 0900  AST 40*  ALT 35  ALKPHOS 86  BILITOT 0.7  PROT 6.1  ALBUMIN 2.7*    Lab 01/06/12 1515  LIPASE 24  AMYLASE --   CBC:  Lab 01/10/12 0555 01/04/12 0700  WBC 8.5 10.2  NEUTROABS -- --  HGB 13.5 14.6  HCT 39.1 43.1  MCV 83.9 82.7  PLT PLATELET CLUMPS NOTED ON SMEAR, COUNT APPEARS ADEQUATE 127*   CBG:  Lab 01/10/12 1121 01/10/12 0735 01/09/12 2208 01/09/12 1649 01/09/12 1333  GLUCAP 224* 240* 232* 164* 148*    Recent Results (from the past 240 hour(s))  STOOL CULTURE     Status: Normal (Preliminary result)   Collection Time   12/31/11  3:16 PM      Component Value Range Status  Comment   Specimen Description STOOL   Final    Special Requests NONE   Final    Culture     Final    Value: Positive for Shiga toxin 2     NO SALMONELLA, SHIGELLA, CAMPYLOBACTER, OR YERSINIA ISOLATED     Note: CRITICAL RESULT CALLED TO, READ BACK BY AND VERIFIED WITH: CHRISTIE S@0747  ON [id number] BY [place] REFERRED TO [place] IN [place] IDENTIFICATION/CONFIRMATION FAXED TO [place].Susanne Greenhouse 514PM [place]   Report Status PENDING   Incomplete      Studies: Dg Swallowing Func-speech Pathology  01/09/2012  Orbie Pyo [place], CCC-SLP     01/09/2012  3:02 PM Objective Swallowing Evaluation: Modified Barium Swallowing Study   Patient Details  Name: Damon [name] MRN: [id number] Date of Birth: 11/29/1933  Today's Date: 01/09/2012 Time: 1340-1400 SLP Time Calculation (min): 20 min  Past Medical History:  Past Medical History  Diagnosis Date  . Bradycardia   .  Diabetes mellitus   . Hypertension   . Chronic kidney disease     BAseline creatinine 1.4 to 1.5  . Arrhythmia     Baseline bradycardia  . Hard of hearing    Past Surgical History:  Past Surgical History  Procedure Date  . Tonsillectomy   . Colonoscopy 01/01/2012    Procedure: COLONOSCOPY;  Surgeon: [name], MD;   Location: [facility] ENDOSCOPY;  Service: Endoscopy;  Laterality: N/A;   HPI:  78 year old who developed severe diarrhea. He has some mild  abdominal pain. Later that day he started having several episodes  of bright red blood per rectum. They went to [facility] and waited in the ER for 5-6 hours. During the wait the  patient's bleeding stopped. He had not been seen, he decided to  go home. His diarrhea did continue both but with no further  bleeding. Admitted today after continuing to feel poorly with  poor appetite.  Pt. diagnosed with GI bleed.  PMH: bradycardia,  DM, HTN, chronic kidney disease.  Pt. has developed delirium and  was eating/drinking without  difficulty here at the hospital until  2 days ago when family reported he "just can't swallow."        Assessment / Plan / Recommendation Clinical Impression  Dysphagia Diagnosis: Moderate oral phase dysphagia;Severe oral  phase dysphagia;Mild pharyngeal phase dysphagia Clinical impression: Pt. exhibits mod-severe oral and mild  pharyngeal dysphagia likely due to decreased mental  status/delerium/over medication (?) resulting in oral transit  delays and delayed swallow initiation to valleculae.  Pt. was  moderately alert and exhibited a decreased sense of overall  awareness of self and surroundings.  He only transited and  initiated a swallow with one tsp sip thin barium and one bite  applesauce and expectorated bolus of puree.  He is presently at  increased aspiration risk with current cognitive state and  recommend NPO except vital meds crushed if applesauce if pt.  Alert and aware.  If pt. becomes alert and is able, RN can  attempt crushed meds (if necessary) and SLP will see at bedside  next date.     Treatment Recommendation  Therapy as outlined in treatment plan below    Diet Recommendation NPO except meds   Medication Administration: Crushed with puree    Other  Recommendations Oral Care Recommendations: Oral care BID   Follow Up Recommendations   (to be determined)    Frequency and Duration min 1 x/week  1 week       SLP Swallow Goals Goal #3: Pt. will maintain alertness and awarenss to timely  transit bolus and initiate swallow with minimal s/s aspiration  with mod verbal/visual cues.      Reason for Referral Objectively evaluate swallowing function   Oral Phase Oral Preparation/Oral Phase Oral Phase: Impaired Oral - Thin Oral - Thin Teaspoon: Right anterior bolus loss;Weak lingual  manipulation;Delayed oral transit (sublingual residue) Oral - Solids Oral - Puree: Delayed oral transit;Weak lingual  manipulation;Reduced posterior propulsion   Pharyngeal Phase Pharyngeal Phase Pharyngeal Phase: Impaired  Pharyngeal - Thin Pharyngeal - Thin Cup: Delayed swallow initiation;Premature  spillage to valleculae Pharyngeal - Solids Pharyngeal - Puree: Delayed swallow initiation;Premature spillage  to valleculae  Cervical Esophageal Phase    GO    Cervical Esophageal Phase Cervical Esophageal Phase: WFL             Scheduled Meds:    . aspirin  81 mg Oral Daily  . cinacalcet  30 mg Oral Custom  . erythromycin   Left Eye Q6H  . feeding supplement  237 mL Oral TID BM  . feeding supplement  30 mL Oral BID  . hydrALAZINE  50 mg Oral Q8H  . insulin aspart  0-15 Units Subcutaneous TID WC  . insulin aspart protamine-insulin aspart  15 Units Subcutaneous BID WC  . isosorbide mononitrate  30 mg Oral Daily  . multivitamin with minerals  1 tablet Oral Daily   Continuous Infusions:    . dextrose 1,000 mL (01/10/12 1103)    Time spent: 52minutes    [facility]  [facility] Pager [phone]. If 8PM-8AM, please contact night-coverage at [email or web address], password [facility] 01/10/2012, 11:47 AM  LOS: 11 days

## 2012-01-10 NOTE — Progress Notes (Signed)
Progress Notes Patient Identification:  Damon [name] Date of Evaluation:  01/10/2012 Reason for Consult: Confusion  Referring Provider: Dr. Broadus [name]  History of Present Illness:  Pt had presented for colonoscopy for GI Bleed. He had gone to [place] and he continued to have blood in stool. His daughter brought him to ED and he was admitted. He was admitted alert and oriented   Past Psychiatric History:not given  Daughter reports loss of short term memory, especially after wife died and he moved in with family    Past Medical History:     Past Medical History  Diagnosis Date  . Bradycardia   . Diabetes mellitus   . Hypertension   . Chronic kidney disease     BAseline creatinine 1.4 to 1.5  . Arrhythmia     Baseline bradycardia  . Hard of hearing        Past Surgical History  Procedure Date  . Tonsillectomy   . Colonoscopy 01/01/2012    Procedure: COLONOSCOPY;  Surgeon: Damon [name]., MD;  Location: [facility] ENDOSCOPY;  Service: Endoscopy;  Laterality: N/A;    Allergies:  Allergies  Allergen Reactions  . Penicillins Other (See Comments)    Reaction as a child    Current Medications:  Prior to Admission medications   Medication Sig Start Date End Date Taking? Authorizing Provider  aspirin 81 MG tablet Take 81 mg by mouth daily.     Yes Historical Provider, MD  cinacalcet (SENSIPAR) 30 MG tablet Take 30 mg by mouth 2 (two) times a week. Takes on Wed and Sun   Yes Historical Provider, MD  furosemide (LASIX) 40 MG tablet Take 40 mg by mouth every other day.    Yes Historical Provider, MD  gemfibrozil (LOPID) 600 MG tablet Take 600 mg by mouth 2 (two) times daily before a meal.   Yes Historical Provider, MD  insulin NPH-insulin regular (HUMULIN 70/30) (70-30) 100 UNIT/ML injection Inject 40-50 Units into the skin 2 (two) times daily with a meal. Takes 50 units in the am & 40 units in the pm   Yes Historical Provider, MD  lisinopril (PRINIVIL,ZESTRIL) 20 MG tablet Take 20 mg  by mouth daily.   Yes Historical Provider, MD    Social History:    reports that he has never smoked. His smokeless tobacco use includes Chew. He reports that he does not drink alcohol or use illicit drugs.   Family History:    Family History  Problem Relation Age of Onset  . Diabetes type II Mother   . Hypertension Mother   . Diabetes type II Father   . Hypertension Father     Mental Status Examination/Evaluation: Objective:  Appearance: obtunded, unconscious  Eye Contact::  Absent  Speech: Mumbling, incoherent attempt to speak  Volume:  Decreased  Mood:    Affect:  Negative  Thought Process:  unable to assess  Orientation:  NA  Thought Content:  NA  Suicidal Thoughts:  No  Homicidal Thoughts:  No  Judgement:  Other:  NA  Insight:  NA   DIAGNOSIS:   AXIS I   Semiconscious due to infectious process  AXIS II  Deferred  AXIS III See medical notes.  AXIS IV economic problems, housing problems, other psychosocial or environmental problems, problems related to social environment and confused upon admission; progressive sonolence  AXIS V 51-60 moderate symptoms   Assessment/Plan:  Discussed with daughter and son Not using gown and gloves.  Daughter says he was showing some  signs of confusion at home.  She tries to waken him - he arouses slightly better than yesterday.  His eyes open briefly and he mutters unintelligible sounds.  He closes his eyes and gives no further response.  Cultures are pending. RECOMMENDATION:  1.  Pt has serious infection and most likely is sedated 1/with medication and 2/due to infectious process.  2.  At present he demonstrates no capacity.  3.  Will continue to follow when pt wakens.  Tequlia Gonsalves MD 01/10/2012 1:12 PM

## 2012-01-10 NOTE — Progress Notes (Signed)
Speech Language Pathology Dysphagia Treatment Patient Details Name: Damon [name] MRN: [id number] DOB: Jun 08, 1933 Today's Date: 01/10/2012 Time: [id number] SLP Time Calculation (min): 13 min  Assessment / Plan / Recommendation Clinical Impression  Pt. much more alert and aware than yesterday.  Pt. conversing with SLP with dysarthric speech.  Pt. able to self feed with max tactile cues and consumed water with immediate throat clears approximately 50% of the time (frequency greater with straw).  Pt. required moderate verbal/tactile cues to ensure small sips.  Solids not attempted due to no dentition, dysarthric and decreased endurance.  Recommend Dys 1 diet with thin liquids via cup (no straws), pills whole in applesauce.  ST will follow to ensure safety with diet/liquids and upgrade when able.       Diet Recommendation  Initiate / Change Diet: Dysphagia 1 (puree);Thin liquid    SLP Plan Continue with current plan of care       Swallowing Goals  SLP Swallowing Goals Patient will utilize recommended strategies during swallow to increase swallowing safety with: Moderate cueing Goal #3: Pt. will maintain alertness and awarenss to timely transit bolus and initiate swallow with minimal s/s aspiration with mod verbal/visual cues. Swallow Study Goal #3 - Progress: Met  General Temperature Spikes Noted: No Respiratory Status: Room air Behavior/Cognition: Alert;Requires cueing;Cooperative;Pleasant mood Oral Cavity - Dentition: Edentulous Patient Positioning: Upright in bed  Oral Cavity - Oral Hygiene Does patient have any of the following "at risk" factors?: Tongue - coated Brush patient's teeth BID with toothbrush (using toothpaste with fluoride): Yes Patient is AT RISK - Oral Care Protocol followed (see row info): Yes   Dysphagia Treatment Treatment focused on: Upgraded PO texture trials;Facilitation of oral preparatory phase;Facilitation of oral phase;Facilitation of pharyngeal  phase Treatment Methods/Modalities: Differential diagnosis Patient observed directly with PO's: Yes Type of PO's observed: Dysphagia 1 (puree);Thin liquids Feeding: Able to feed self;Needs assist Liquids provided via: Cup;Straw Oral Phase Signs & Symptoms: Anterior loss/spillage Pharyngeal Phase Signs & Symptoms: Suspected delayed swallow initiation;Immediate throat clear;Delayed throat clear Type of cueing: Verbal;Visual;Tactile Amount of cueing: Moderate   GO     Orbie Pyo [place].[employer] [phone]  01/10/2012

## 2012-01-11 LAB — URINE MICROSCOPIC-ADD ON

## 2012-01-11 LAB — URINALYSIS, ROUTINE W REFLEX MICROSCOPIC
Bilirubin Urine: NEGATIVE
Glucose, UA: 500 mg/dL — AB
Hgb urine dipstick: NEGATIVE
Ketones, ur: 15 mg/dL — AB
Leukocytes, UA: NEGATIVE
Nitrite: NEGATIVE
Protein, ur: 100 mg/dL — AB
Specific Gravity, Urine: 1.021 (ref 1.005–1.030)
Urobilinogen, UA: 0.2 mg/dL (ref 0.0–1.0)
pH: 5 (ref 5.0–8.0)

## 2012-01-11 LAB — GLUCOSE, CAPILLARY
Glucose-Capillary: 198 mg/dL — ABNORMAL HIGH (ref 70–99)
Glucose-Capillary: 218 mg/dL — ABNORMAL HIGH (ref 70–99)
Glucose-Capillary: 115 mg/dL — ABNORMAL HIGH (ref 70–99)

## 2012-01-11 MED ORDER — SODIUM CHLORIDE 0.45 % IV SOLN
INTRAVENOUS | Status: DC
  Administered 2012-01-11: 12:00:00 via INTRAVENOUS

## 2012-01-11 MED ORDER — TRAZODONE 25 MG HALF TABLET
25.0000 mg | ORAL_TABLET | Freq: Every evening | ORAL | Status: DC
  Administered 2012-01-11: 25 mg via ORAL
  Filled 2012-01-11 (×2): qty 1

## 2012-01-11 MED ORDER — ENSURE PUDDING PO PUDG
1.0000 | Freq: Three times a day (TID) | ORAL | Status: DC
  Administered 2012-01-11 – 2012-01-15 (×6): 1 via ORAL

## 2012-01-11 MED ORDER — HYDRALAZINE HCL 20 MG/ML IJ SOLN
10.0000 mg | Freq: Once | INTRAMUSCULAR | Status: AC
  Administered 2012-01-11: 10 mg via INTRAVENOUS
  Filled 2012-01-11: qty 0.5

## 2012-01-11 NOTE — Progress Notes (Signed)
[facility] PROGRESS NOTE  KNOAH WASHABAUGH [id number] DOB: [birth year] DOA: 12/30/2011 PCP: Arnette Norris, MD  Assessment/Plan:  1. Infectious colitis: Shiga toxin  and bright red blood in stool: Status post colonoscopy on 12/10 which was consistent with infectious etiology,final report 12/12 states shiga toxin positive but Shigella and Salmonella negative. Seen by ID, recommended supportive care only, Antibiotics stopped, no further diarrhea, leukocytosis resolved.  2.   Delirium: mild underlying cognitive dysfunction vs early dementia, upon discussion with daughter for 1-2 months prior he was getting his morning and evening medications mixed up, forgetting things, especially recent memory, now with possible sundowning/delirium. Per nsg staff pt has had some intermittent confusion and agitation. But beginning on pm of 12/14 was more confused and combative and so was started on risperidone. He initially appeared to improve on risperidone, but became drowsy and with increased tone in lower extremities, hence held PM risperdal, cognitively was clearer yesterday but worse today and also didn't sleep any last pm. Will add low dose trazodone for sleep at 7pm tonight, re-orientation techniques, supportive care. -CT scan of head ordered shows no acute intracranial findings - B12, RPR normal - check UA  3.  Acute on chronic renal failure: Resolved, was secondary to dehydration, hypotension and continue use of ACE. Continue holding ACE.   4.  DM : CBG up with D5W, hopefully can stop IVF when PO intake improves, continue insulin  70/30, continue SSI.  A1C 6.8  5.   Metabolic acidosis: Resolved, 2/2 diarrhea, dehydration and acute on chronic kidney disease. Resolved with hydration,   6.  Deconditioning: PT OT recommending skilled nursing, social work consulted to assist with placement  7. . Mild ascites: Per ultrasound, and discussed with radiologist on 12/14 and amount of fluid very small and  therapeutic paracentesis would not make a difference.albumin 2.7 and LFTs wnl. No further Lasix as hypernatremic, gentle hydration now resumed due to hypernatremia  9.. HTN: Better blood pressure control on hydralazine and imdur. continue holding ACEI  10. ?Arrhythmia: pt with h/o arrhythmia and EKG 12/15 with undetermined rhythm, but the repeat EKG of 12/16 showed normal sinus- cardiac enzymes negative, 2-D echo with EF of 60% no wall motion abnormality  11.Generalized weakness: secondary to dehydration/diarrhea, and deconditioning. PT OT recommending skilled nursing.  12. ?Dysphagia: Swallow evaluation, follow., D1 diet   DVT prophylaxis: SCDs Code Status: Full Family Communication: Long discussion with Son and daughter at bedside Disposition Plan: SNF when medically stable   Consultants:  GI Youth worker, Dr. Oletta Lamas)  ID-Dr Tommy Medal  Dr. Evern Bio-  Antibiotics:  Flagyl 12/8>>12/11  Rocephin 12/11>>12/12  HPI/Subjective: Had a good day yesterday, more confused this am, hardly got any sleep last pm Objective: Filed Vitals:   01/10/12 1408 01/10/12 2043 01/11/12 0502 01/11/12 0939  BP: 168/92 147/73 158/82 159/72  Pulse: 95 81 89 88  Temp: 98.5 F (36.9 C) 97.4 F (36.3 C) 97.9 F (36.6 C) 97.2 F (36.2 C)  TempSrc: Oral Oral Oral Oral  Resp: 22 20 22 18   Height:      Weight:  100.1 kg (220 lb 10.9 oz)    SpO2: 97% 99% 100% 94%    Intake/Output Summary (Last 24 hours) at 01/11/12 1037 Last data filed at 01/11/12 0900  Gross per 24 hour  Intake     50 ml  Output    950 ml  Net   -900 ml   Filed Weights   01/08/12 2107 01/09/12 2321 01/10/12 2043  Weight: 102.195  kg (225 lb 4.8 oz) 99.3 kg (218 lb 14.7 oz) 100.1 kg (220 lb 10.9 oz)    Exam:   General:  Awake, confused, picking at things, answers some questions  Cardiovascular: S1 and S2, no rubs or gallops  Respiratory: Decreased breath sounds at the bases, otherwise CTA bilaterally  Abdomen: Obese, soft,  nontender, no rebound, positive BS; distended  Neuro: no focal or weakness. Increased tone in both lower ext, L>R, moving all extremities grossly. Follow simple commands.  Data Reviewed: Basic Metabolic Panel:  Lab [id number] 0555 01/09/12 0522 01/08/12 0505 01/07/12 0710 01/06/12 0900 01/05/12 2140  NA 145 148* 149* 147* 144 --  K 3.9 3.3* 4.4 4.2 4.0 --  CL 111 114* 115* 115* 113* --  CO2 22 24 21 20 20  --  GLUCOSE 278* 143* 141* 164* 140* --  BUN 20 16 18 16 16  --  CREATININE 1.23 1.33 1.34 1.32 1.26 --  CALCIUM 10.0 10.0 9.9 9.9 10.4 --  MG -- -- -- -- -- 1.9  PHOS -- -- -- -- -- --   Liver Function Tests:  Lab 01/06/12 0900  AST 40*  ALT 35  ALKPHOS 86  BILITOT 0.7  PROT 6.1  ALBUMIN 2.7*    Lab 01/06/12 1515  LIPASE 24  AMYLASE --   CBC:  Lab 01/10/12 0555  WBC 8.5  NEUTROABS --  HGB 13.5  HCT 39.1  MCV 83.9  PLT PLATELET CLUMPS NOTED ON SMEAR, COUNT APPEARS ADEQUATE   CBG:  Lab 01/11/12 0747 01/10/12 2033 01/10/12 1618 01/10/12 1121 01/10/12 0735  GLUCAP 198* 202* 176* 224* 240*    No results found for this or any previous visit (from the past 240 hour(s)).   Studies: Dg Swallowing Func-speech Pathology  01/09/2012  [place], CCC-SLP     01/09/2012  3:02 PM Objective Swallowing Evaluation: Modified Barium Swallowing Study   Patient Details  Name: Damon [name] MRN: [id number] Date of Birth: 1933/11/17  Today's Date: 01/09/2012 Time: 1340-1400 SLP Time Calculation (min): 20 min  Past Medical History:  Past Medical History  Diagnosis Date  . Bradycardia   . Diabetes mellitus   . Hypertension   . Chronic kidney disease     BAseline creatinine 1.4 to 1.5  . Arrhythmia     Baseline bradycardia  . Hard of hearing    Past Surgical History:  Past Surgical History  Procedure Date  . Tonsillectomy   . Colonoscopy 01/01/2012    Procedure: COLONOSCOPY;  Surgeon: [name], MD;   Location[facility] ENDOSCOPY;  Service: Endoscopy;  Laterality: N/A;   HPI:   78 year old who developed severe diarrhea. He has some mild  abdominal pain. Later that day he started having several episodes  of bright red blood per rectum. They went to [facility] and waited in the ER for 5-6 hours. During the wait the  patient's bleeding stopped. He had not been seen, he decided to  go home. His diarrhea did continue both but with no further  bleeding. Admitted today after continuing to feel poorly with  poor appetite.  Pt. diagnosed with GI bleed.  PMH: bradycardia,  DM, HTN, chronic kidney disease.  Pt. has developed delirium and  was eating/drinking without difficulty here at the hospital until  2 days ago when family reported he "just can't swallow."        Assessment / Plan / Recommendation Clinical Impression  Dysphagia Diagnosis: Moderate oral phase dysphagia;Severe oral  phase  dysphagia;Mild pharyngeal phase dysphagia Clinical impression: Pt. exhibits mod-severe oral and mild  pharyngeal dysphagia likely due to decreased mental  status/delerium/over medication (?) resulting in oral transit  delays and delayed swallow initiation to valleculae.  Pt. was  moderately alert and exhibited a decreased sense of overall  awareness of self and surroundings.  He only transited and  initiated a swallow with one tsp sip thin barium and one bite  applesauce and expectorated bolus of puree.  He is presently at  increased aspiration risk with current cognitive state and  recommend NPO except vital meds crushed if applesauce if pt.  Alert and aware.  If pt. becomes alert and is able, RN can  attempt crushed meds (if necessary) and SLP will see at bedside  next date.     Treatment Recommendation  Therapy as outlined in treatment plan below    Diet Recommendation NPO except meds   Medication Administration: Crushed with puree    Other  Recommendations Oral Care Recommendations: Oral care BID   Follow Up Recommendations   (to be determined)    Frequency and Duration min 1 x/week  1 week        SLP Swallow Goals Goal #3: Pt. will maintain alertness and awarenss to timely  transit bolus and initiate swallow with minimal s/s aspiration  with mod verbal/visual cues.      Reason for Referral Objectively evaluate swallowing function   Oral Phase Oral Preparation/Oral Phase Oral Phase: Impaired Oral - Thin Oral - Thin Teaspoon: Right anterior bolus loss;Weak lingual  manipulation;Delayed oral transit (sublingual residue) Oral - Solids Oral - Puree: Delayed oral transit;Weak lingual  manipulation;Reduced posterior propulsion   Pharyngeal Phase Pharyngeal Phase Pharyngeal Phase: Impaired Pharyngeal - Thin Pharyngeal - Thin Cup: Delayed swallow initiation;Premature  spillage to valleculae Pharyngeal - Solids Pharyngeal - Puree: Delayed swallow initiation;Premature spillage  to valleculae  Cervical Esophageal Phase    GO    Cervical Esophageal Phase Cervical Esophageal Phase: WFL             Scheduled Meds:    . aspirin  81 mg Oral Daily  . cinacalcet  30 mg Oral Custom  . erythromycin   Left Eye Q6H  . feeding supplement  1 Container Oral TID BM  . feeding supplement  237 mL Oral TID BM  . hydrALAZINE  50 mg Oral Q8H  . insulin aspart  0-15 Units Subcutaneous TID WC  . insulin aspart protamine-insulin aspart  15 Units Subcutaneous BID WC  . isosorbide mononitrate  30 mg Oral Daily  . multivitamin with minerals  1 tablet Oral Daily  . traZODone  25 mg Oral QPM   Continuous Infusions:    Time spent: 33minutes    [facility]  [facility] Pager [phone]. If 8PM-8AM, please contact night-coverage at [email or web address], password [facility] 01/11/2012, 10:37 AM  LOS: 12 days

## 2012-01-11 NOTE — Progress Notes (Signed)
Clinical Social Work Progress Note PSYCHIATRY SERVICE LINE 01/11/2012  Patient:  LUIS SHEARER Kercheval  Account:  [id number]  Admit Date:  12/30/2011  Clinical Social Worker:  Sindy Messing, LCSW  Date/Time:  01/11/2012 09:00 AM  Review of Patient  Overall Medical Condition:   RN reports patient is more alert but still confused. Per unit CSW patient will dc to SNF when medically ready to dc.   Participation Level:  None  Participation Quality  Other - See comment   Other Participation Quality:   Patient completing procedure with RN in room. CSW spoke with dtr.   Affect  Anxious   Cognitive  Confused   Reaction to Medications/Concerns:   None reported   Modes of Intervention  Support   Summary of Progress/Plan at Discharge   CSW unable to meet with patient at this time due to procedure occurring. CSW spoke with RN who reports that patient is much more alert and talkative today but still confused.    CSW met with patient's dtr Sula Soda) at the doorway. Dtr reports that patient is still confused and she is interested in talking with psych MD. Dtr reports she met psych MD yesterday. Dtr states that she feels patient was confused due to medications and is hopeful he will be less confused if medications are changed. Dtr appreciative of CSW consult but reports no needs at this time.    CSW will continue to follow.

## 2012-01-11 NOTE — Progress Notes (Signed)
Occupational Therapy Treatment Patient Details Name: Damon [name] MRN: [id number] DOB: [birth year] Today's Date: 01/11/2012 Time: [id number] OT Time Calculation (min): 31 min  OT Assessment / Plan / Recommendation Comments on Treatment Session Pt not progressing towards goals this session.  Pt very resistant to transfers and ADL activities (due to confusion).     Follow Up Recommendations  SNF;CIR    Barriers to Discharge       Equipment Recommendations  3 in 1 bedside comode    Recommendations for Other Services    Frequency Min 2X/week   Plan Discharge plan remains appropriate    Precautions / Restrictions Precautions Precautions: Fall Restrictions Weight Bearing Restrictions: No   Pertinent Vitals/Pain See vitals    ADL  Grooming: Performed;Wash/dry face;+1 Total assistance Where Assessed - Grooming: Unsupported sitting Equipment Used: Gait belt Transfers/Ambulation Related to ADLs: Attempted sit<>stand but pt resisted transfer. ADL Comments: Pt resistant to tactile cueing and hand over hand assist to wash his face.    OT Diagnosis:    OT Problem List:   OT Treatment Interventions:     OT Goals ADL Goals Pt Will Perform Grooming: with modified independence;Standing at sink;Unsupported ADL Goal: Grooming - Progress: Not progressing (due to confusion) Miscellaneous OT Goals Miscellaneous OT Goal #1: Pt will perform all bed mobility with supervision and min verbal cueing as precursor for EOB ADLs. OT Goal: Miscellaneous Goal #1 - Progress: Goal set today  Visit Information  Last OT Received On: 01/11/12 Assistance Needed: +3 or more    Subjective Data      Prior Functioning       Cognition  Overall Cognitive Status: Impaired Area of Impairment: Attention;Following commands Arousal/Alertness: Awake/alert Orientation Level: Disoriented to;Time;Place;Situation Behavior During Session: Restless Current Attention Level: Focused Following Commands: Follows  one step commands inconsistently;Follows one step commands with increased time    Mobility  Shoulder Instructions Bed Mobility Bed Mobility: Supine to Sit;Sit to Supine;Rolling Right;Rolling Left;Scooting to [facility] Rolling Right: 1: +2 Total assist Rolling Right: Patient Percentage: 10% Rolling Left: 1: +2 Total assist Rolling Left: Patient Percentage: 0% Supine to Sit: 1: +2 Total assist Supine to Sit: Patient Percentage: 10% Sitting - Scoot to Edge of Bed: 1: +2 Total assist Sitting - Scoot to Edge of Bed: Patient Percentage: 0% Sit to Supine:  (+3totalpt0% (pt totally resistant)) Scooting to HOB: 1: +2 Total assist Scooting to [facility]: Patient Percentage: 0% Details for Bed Mobility Assistance: Resistant with most mobility, extending through trunk and lower extremities, slightly easier with supine->sit but still needing heavy assist; was refusing to lie back down so needed +3totalA Transfers Details for Transfer Assistance: 3 attempts to get pt to stand, pt pushign back and resistant regardless of verbal cueing       Exercises     Balance Balance Balance Assessed: Yes Static Sitting Balance Static Sitting - Balance Support: Bilateral upper extremity supported Static Sitting - Level of Assistance: 5: Stand by assistance Static Sitting - Comment/# of Minutes: no physical assist needed, pt sat for at least 15 minutes EOB and somewhat participating in exercises without LOB   End of Session OT - End of Session Equipment Utilized During Treatment: Gait belt Activity Tolerance: Other (comment) (confusion) Patient left: in bed;with call bell/phone within reach;with bed alarm set Nurse Communication: Mobility status;Other (comment) (RN assisted with returning pt to supine position)  GO   01/11/2012 Darrol Jump OTR/L Pager [phone] Office [phone]   Darrol Jump 01/11/2012, 4:50 PM

## 2012-01-11 NOTE — Progress Notes (Signed)
Physical Therapy Treatment Patient Details Name: Damon [name] MRN: [id number] DOB: 12-28-1933 Today's Date: 01/11/2012 Time: [id number] PT Time Calculation (min): 30 min  PT Assessment / Plan / Recommendation Comments on Treatment Session  Significant decline in participation and functional status. No progress towards goals.     Follow Up Recommendations  SNF     Does the patient have the potential to tolerate intense rehabilitation     Barriers to Discharge        Equipment Recommendations       Recommendations for Other Services    Frequency Min 3X/week   Plan Frequency remains appropriate;Discharge plan needs to be updated    Precautions / Restrictions Precautions Precautions: Fall Restrictions Weight Bearing Restrictions: No       Mobility  Bed Mobility Bed Mobility: Supine to Sit;Sit to Supine;Rolling Right;Rolling Left;Scooting to [facility] Rolling Right: 1: +2 Total assist Rolling Right: Patient Percentage: 10% Rolling Left: 1: +2 Total assist Rolling Left: Patient Percentage: 0% Supine to Sit: 1: +2 Total assist Supine to Sit: Patient Percentage: 10% Sitting - Scoot to Edge of Bed: 1: +2 Total assist Sitting - Scoot to Edge of Bed: Patient Percentage: 0% Sit to Supine:  (+3totalpt0% (pt totally resistant)) Scooting to HOB: 1: +2 Total assist Scooting to [facility]: Patient Percentage: 0% Details for Bed Mobility Assistance: Resistant with most mobility, extending through trunk and lower extremities, slightly easier with supine->sit but still needing heavy assist; was refusing to lie back down so needed +3totalA Transfers Transfers: Sit to Stand Details for Transfer Assistance: 3 attempts to get pt to stand, pt pushign back and resistant regardless of verbal cueing Ambulation/Gait Ambulation/Gait Assistance: Not tested (comment)    Exercises General Exercises - Lower Extremity Long Arc Quad: AAROM;Both;10 reps;Seated    PT Goals Acute Rehab PT Goals Pt will go  Supine/Side to Sit: with mod assist PT Goal: Supine/Side to Sit - Progress: Revised due to lack of progress Pt will go Sit to Supine/Side: with mod assist PT Goal: Sit to Supine/Side - Progress: Revised due to lack of progress Pt will go Sit to Stand: with mod assist PT Goal: Sit to Stand - Progress: Revised due to lack of progress Pt will go Stand to Sit: with mod assist PT Goal: Stand to Sit - Progress: Revised due to lack of progress Pt will Transfer Bed to Chair/Chair to Bed: with mod assist PT Transfer Goal: Bed to Chair/Chair to Bed - Progress: Revised due to lack of progress  Visit Information  Last PT Received On: 01/11/12 Assistance Needed: +3 or more    Subjective Data  Subjective: "Im not doing anything." (slurred and garbled speech)   Cognition  Overall Cognitive Status: Impaired Area of Impairment: Attention;Following commands Arousal/Alertness: Awake/alert Orientation Level: Disoriented to;Time;Place;Situation Behavior During Session: Restless Current Attention Level: Focused Following Commands: Follows one step commands inconsistently;Follows one step commands with increased time    Balance  Balance Balance Assessed: Yes Static Sitting Balance Static Sitting - Balance Support: Bilateral upper extremity supported Static Sitting - Level of Assistance: 5: Stand by assistance Static Sitting - Comment/# of Minutes: no physical assist needed, pt sat for at least 15 minutes EOB and somewhat participating in exercises without LOB  End of Session PT - End of Session Equipment Utilized During Treatment: Gait belt Activity Tolerance: Patient limited by fatigue;Treatment limited secondary to agitation;Other (comment) (confusion and resistance) Patient left: in bed;with call bell/phone within reach Nurse Communication: Mobility status   GP  [facility] HELEN 01/11/2012, 4:22 PM

## 2012-01-11 NOTE — Progress Notes (Signed)
NUTRITION FOLLOW UP  Intervention:   1. Pt refusing Prostat. Will d/c. 2. Ensure Pudding po TID, each supplement provides 170 kcal and 4 grams of protein.  3. RD to continue to follow nutrition care plan  Nutrition Dx:   Inadequate oral intake related to poor mental status and appetite as evidenced by poor meal completion. Improving.  Goal:   Pt to meet >/= 90% of their estimated nutrition needs. Improving.  Monitor:   weight trends, lab trends, I/O's, PO intake, supplement tolerance  Assessment:   Underwent MBSS on 12/18, with recommendations for NPO 2/2 mental status. Pt seen by SLP for f/u on 12/19 with recommendations for Dysphagia 1 diet with thin liquids. Pt was much more alert for participation in swallow eval. Pt consumed 50% of breakfast this morning.  Height: Ht Readings from Last 1 Encounters:  01/09/12 5\' 5"  (1.651 m)    Weight Status:   Wt Readings from Last 1 Encounters:  01/10/12 220 lb 10.9 oz (100.1 kg)   Re-estimated needs:  Kcal: 1800 - 2000 kcal Protein: 95 - 110 grams protein Fluid: 1.8 - 2 liters daily  Skin: none  Diet Order: Dysphagia 1 Supplement: Glucerna Shakes PO TID, 30 ml Prostat PO BID   Intake/Output Summary (Last 24 hours) at 01/11/12 1018 Last data filed at 01/11/12 0900  Gross per 24 hour  Intake     50 ml  Output    950 ml  Net   -900 ml   Last BM: 12/16  Labs:   Lab 01/10/12 0555 01/09/12 0522 01/08/12 0505 01/05/12 2140  NA 145 148* 149* --  K 3.9 3.3* 4.4 --  CL 111 114* 115* --  CO2 22 24 21  --  BUN 20 16 18  --  CREATININE 1.23 1.33 1.34 --  CALCIUM 10.0 10.0 9.9 --  MG -- -- -- 1.9  PHOS -- -- -- --  GLUCOSE 278* 143* 141* --    CBG (last 3)   Basename 01/11/12 0747 01/10/12 2033 01/10/12 1618  GLUCAP 198* 202* 176*    Scheduled Meds:   . aspirin  81 mg Oral Daily  . cinacalcet  30 mg Oral Custom  . erythromycin   Left Eye Q6H  . feeding supplement  237 mL Oral TID BM  . feeding supplement  30 mL Oral  BID  . hydrALAZINE  50 mg Oral Q8H  . insulin aspart  0-15 Units Subcutaneous TID WC  . insulin aspart protamine-insulin aspart  15 Units Subcutaneous BID WC  . isosorbide mononitrate  30 mg Oral Daily  . multivitamin with minerals  1 tablet Oral Daily    Continuous Infusions:   Inda Coke MS, RD, LDN Pager: [phone] After-hours pager: [phone]

## 2012-01-11 NOTE — Progress Notes (Signed)
Speech Language Pathology Dysphagia Treatment Patient Details Name: Damon [name] MRN: [id number] DOB: 1934/01/21 Today's Date: 01/11/2012 Time: [id number] SLP Time Calculation (min): 8 min  Assessment / Plan / Recommendation Clinical Impression  Attempted to observe pt. with recommended diet with family at bedside.  Presently he is alert, however unable to sustain attend to SLP or activity, hallucinating and grabbing at the air.  He refused water with encouragement and moderate verbal/visual cues.  RN reported pt. ate breakfast without difficulty.  Continued family education regarding clinical rationale for current dysphagia (cognitive based) and advised to limits distractions during meals.  Continue Dys 1, thin liquids when pt. able to sustain attention.       Diet Recommendation  Continue with Current Diet: Dysphagia 1 (puree);Thin liquid    SLP Plan Continue with current plan of care       Swallowing Goals  SLP Swallowing Goals Patient will utilize recommended strategies during swallow to increase swallowing safety with: Moderate cueing Swallow Study Goal #2 - Progress: Progressing toward goal Swallow Study Goal #3 - Progress: Progressing toward goal  General Temperature Spikes Noted: No Respiratory Status: Room air Behavior/Cognition: Distractible;Requires cueing;Alert;Confused (restless) Oral Cavity - Dentition: Edentulous Patient Positioning: Upright in bed  Oral Cavity - Oral Hygiene Does patient have any of the following "at risk" factors?: Tongue - coated;Lips - dry, cracked Brush patient's teeth BID with toothbrush (using toothpaste with fluoride): Yes Patient is HIGH RISK - Oral Care Protocol followed (see row info): Yes   Dysphagia Treatment Treatment focused on: Skilled observation of diet tolerance;Patient/family/caregiver education Treatment Methods/Modalities: Skilled observation Patient observed directly with PO's: No Reason PO's not observed: Refused Type of  cueing: Verbal;Visual;Tactile Amount of cueing: Moderate        Orbie Pyo [place].[employer] [phone]  01/11/2012

## 2012-01-11 NOTE — Clinical Social Work Note (Signed)
CSW continuing to monitor patient's progress and will facilitate discharge to [facility] when patient medically stable.  Honest Safranek Givens, MSW, LCSW [phone]

## 2012-01-12 ENCOUNTER — Inpatient Hospital Stay (HOSPITAL_COMMUNITY): Payer: Medicare Other

## 2012-01-12 LAB — BASIC METABOLIC PANEL
BUN: 18 mg/dL (ref 6–23)
CO2: 25 mEq/L (ref 19–32)
Calcium: 10.3 mg/dL (ref 8.4–10.5)
Chloride: 113 mEq/L — ABNORMAL HIGH (ref 96–112)
Creatinine, Ser: 1.37 mg/dL — ABNORMAL HIGH (ref 0.50–1.35)
GFR calc Af Amer: 56 mL/min — ABNORMAL LOW (ref >90)
GFR calc non Af Amer: 49 mL/min — ABNORMAL LOW (ref >90)
Glucose, Bld: 193 mg/dL — ABNORMAL HIGH (ref 70–99)
Potassium: 3.4 mEq/L — ABNORMAL LOW (ref 3.5–5.1)
Sodium: 149 mEq/L — ABNORMAL HIGH (ref 135–145)

## 2012-01-12 LAB — BLOOD GAS, ARTERIAL
Acid-Base Excess: 0.7 mmol/L (ref 0.0–2.0)
Bicarbonate: 25 mEq/L — ABNORMAL HIGH (ref 20.0–24.0)
O2 Content: 3 L/min
O2 Saturation: 98.5 %
Patient temperature: 98.6
TCO2: 26.2 mmol/L (ref 0–100)
pCO2 arterial: 41.5 mmHg (ref 35.0–45.0)
pH, Arterial: 7.396 (ref 7.350–7.450)
pO2, Arterial: 107 mmHg — ABNORMAL HIGH (ref 80.0–100.0)

## 2012-01-12 LAB — GLUCOSE, CAPILLARY
Glucose-Capillary: 175 mg/dL — ABNORMAL HIGH (ref 70–99)
Glucose-Capillary: 231 mg/dL — ABNORMAL HIGH (ref 70–99)
Glucose-Capillary: 221 mg/dL — ABNORMAL HIGH (ref 70–99)
Glucose-Capillary: 199 mg/dL — ABNORMAL HIGH (ref 70–99)
Glucose-Capillary: 172 mg/dL — ABNORMAL HIGH (ref 70–99)

## 2012-01-12 LAB — CBC
HCT: 39.9 % (ref 39.0–52.0)
Hemoglobin: 13.2 g/dL (ref 13.0–17.0)
MCH: 28.2 pg (ref 26.0–34.0)
MCHC: 33.1 g/dL (ref 30.0–36.0)
MCV: 85.3 fL (ref 78.0–100.0)
Platelets: 152 10*3/uL (ref 150–400)
RBC: 4.68 MIL/uL (ref 4.22–5.81)
RDW: 16.8 % — ABNORMAL HIGH (ref 11.5–15.5)
WBC: 8.4 10*3/uL (ref 4.0–10.5)

## 2012-01-12 LAB — AMMONIA: Ammonia: 30 umol/L (ref 11–60)

## 2012-01-12 LAB — MRSA PCR SCREENING: MRSA by PCR: NEGATIVE

## 2012-01-12 MED ORDER — LORAZEPAM 2 MG/ML IJ SOLN
INTRAMUSCULAR | Status: AC
  Filled 2012-01-12: qty 1

## 2012-01-12 MED ORDER — FUROSEMIDE 10 MG/ML IJ SOLN
40.0000 mg | Freq: Once | INTRAMUSCULAR | Status: AC
  Administered 2012-01-12: 40 mg via INTRAVENOUS
  Filled 2012-01-12: qty 4

## 2012-01-12 MED ORDER — INSULIN ASPART PROT & ASPART (70-30 MIX) 100 UNIT/ML ~~LOC~~ SUSP
12.0000 [IU] | Freq: Two times a day (BID) | SUBCUTANEOUS | Status: DC
  Administered 2012-01-13 (×2): 12 [IU] via SUBCUTANEOUS

## 2012-01-12 MED ORDER — LORAZEPAM 2 MG/ML IJ SOLN: 1.0000 mg | INTRAMUSCULAR | Status: DC | PRN

## 2012-01-12 NOTE — Consult Note (Signed)
Reason for Consult:AMS Referring Physician: Broadus John   CC: AMS  HPI: Damon [name] is an 78 y.o. male who intially presented with diarrhea diagnosed with infectious colitis and acute on chronic renal failure who after treatment of above began to develop mental status changes.   Patient seems to have some baseline cognitive issues that the family reports started a few months prior with the patient forgetting medications, etc.   Beginning on pm of 12/14 was more confused and combative and so was started on risperidone. He initially appeared to improved on risperidone, but became drowsy and with increased tone in lower extremities.  Risperdal was stopped on 12/18 and  cognitively was clearer yesterday(12/19) but worse 12/20 and also didn't sleep any last pm (12/20). Was given low dose trazodone at 7pm 12/20 to help with sleep, this am lethargic with eyes rolling backwards and jerky movements witnessed by RN.  Trazodone was discontinued today.   Past Medical History  Diagnosis Date  . Bradycardia   . Diabetes mellitus   . Hypertension   . Chronic kidney disease     BAseline creatinine 1.4 to 1.5  . Arrhythmia     Baseline bradycardia  . Hard of hearing     Past Surgical History  Procedure Date  . Tonsillectomy   . Colonoscopy 01/01/2012    Procedure: COLONOSCOPY;  Surgeon: [name], MD;  Location: [facility] ENDOSCOPY;  Service: Endoscopy;  Laterality: N/A;    Family History  Problem Relation Age of Onset  . Diabetes type II Mother   . Hypertension Mother   . Diabetes type II Father   . Hypertension Father     Social History:  reports that he has never smoked. His smokeless tobacco use includes Chew. He reports that he does not drink alcohol or use illicit drugs.  Allergies  Allergen Reactions  . Penicillins Other (See Comments)    Reaction as a child    Medications:  I have reviewed the patient's current medications. Scheduled:   . aspirin  81 mg Oral Daily  . cinacalcet   30 mg Oral Custom  . erythromycin   Left Eye Q6H  . feeding supplement  1 Container Oral TID BM  . feeding supplement  237 mL Oral TID BM  . hydrALAZINE  50 mg Oral Q8H  . insulin aspart  0-15 Units Subcutaneous TID WC  . insulin aspart protamine-insulin aspart  12 Units Subcutaneous BID WC  . isosorbide mononitrate  30 mg Oral Daily  . LORazepam      . multivitamin with minerals  1 tablet Oral Daily    ROS: Unable to obtain  Physical Examination: Blood pressure 150/64, pulse 81, temperature 98.6 F (37 C), temperature source Oral, resp. rate 16, height 5\' 5"  (1.651 m), weight 98.9 kg (218 lb 0.6 oz), SpO2 97.00%.  Neurologic Examination Mental Status: Patient grimaces to deep sternal rub.  Localizes to pain with RUE.  Patient does not respond to verbal stimuli.  Does not follow commands.  No verbalizations noted.  Cranial Nerves: II: patient does not respond confrontation bilaterally, pupils right 3 mm, left 3 mm,and reactive bilaterally III,IV,VI: doll's response absent bilaterally but patient resists movement of head V,VII: Closes eyes shut strongly bilaterally and resists active opening VIII: patient does not respond to verbal stimuli IX,X: gag reflex un able to test, XI: trapezius strength unable to test bilaterally XII: tongue strength unable to test Motor: Moves RUE spontaneously and localizes pain.  No movement noted of  the LUE.  Withdraws both lower extremities with stimuli Sensory: Respond to noxious stimuli in all extremities except the LUE. Deep Tendon Reflexes:  Absent throughout. Plantars: mute bilaterally Cerebellar: Unable to perform    Laboratory Studies:   Basic Metabolic Panel:  Lab [id number] 0500 01/10/12 0555 01/09/12 0522 01/08/12 0505 01/07/12 0710 01/05/12 2140  NA 149* 145 148* 149* 147* --  K 3.4* 3.9 3.3* 4.4 4.2 --  CL 113* 111 114* 115* 115* --  CO2 25 22 24 21 20  --  GLUCOSE 193* 278* 143* 141* 164* --  BUN 18 20 16 18 16  --   CREATININE 1.37* 1.23 1.33 1.34 1.32 --  CALCIUM 10.3 10.0 10.0 -- -- --  MG -- -- -- -- -- 1.9  PHOS -- -- -- -- -- --    Liver Function Tests:  Lab 01/06/12 0900  AST 40*  ALT 35  ALKPHOS 86  BILITOT 0.7  PROT 6.1  ALBUMIN 2.7*    Lab 01/06/12 1515  LIPASE 24  AMYLASE --    Lab 01/12/12 0949 01/06/12 1515  AMMONIA 30 36    CBC:  Lab 01/12/12 0500 01/10/12 0555  WBC 8.4 8.5  NEUTROABS -- --  HGB 13.2 13.5  HCT 39.9 39.1  MCV 85.3 83.9  PLT 152 PLATELET CLUMPS NOTED ON SMEAR, COUNT APPEARS ADEQUATE    Cardiac Enzymes:  Lab 01/09/12 0522 01/06/12 0859 01/05/12 2139  CKTOTAL 139 -- --  CKMB -- -- --  CKMBINDEX -- -- --  TROPONINI -- <0.30 <0.30    BNP: No components found with this basename: POCBNP:5  CBG:  Lab 01/12/12 1143 01/12/12 0822 01/11/12 1624 01/11/12 1144 01/11/12 0747  GLUCAP 231* 175* 115* 218* 198*    Microbiology: Results for orders placed during the hospital encounter of 12/30/11  CLOSTRIDIUM DIFFICILE BY PCR     Status: Normal   Collection Time   12/30/11  9:13 PM      Component Value Range Status Comment   C difficile by pcr NEGATIVE  NEGATIVE Final   STOOL CULTURE     Status: Normal (Preliminary result)   Collection Time   12/31/11  3:16 PM      Component Value Range Status Comment   Specimen Description STOOL   Final    Special Requests NONE   Final    Culture     Final    Value: Positive for Shiga toxin 2     NO SALMONELLA, SHIGELLA, CAMPYLOBACTER, OR YERSINIA ISOLATED     Note: CRITICAL RESULT CALLED TO, READ BACK BY AND VERIFIED WITH: CHRISTIE S@0747  ON [id number] BY [place] REFERRED TO [employer] IN [place] IDENTIFICATION/CONFIRMATION FAXED TO [place] [employer]Susanne Greenhouse [id number]      MODAN   Report Status PENDING   Incomplete     Coagulation Studies: No results found for this basename: LABPROT:5,INR:5 in the last 72 hours  Urinalysis:  Lab 01/11/12 1225  COLORURINE YELLOW   LABSPEC 1.021  PHURINE 5.0  GLUCOSEU 500*  HGBUR NEGATIVE  BILIRUBINUR NEGATIVE  KETONESUR 15*  PROTEINUR 100*  UROBILINOGEN 0.2  NITRITE NEGATIVE  LEUKOCYTESUR NEGATIVE    Lipid Panel:     Component Value Date/Time   CHOL 129 03/28/2010 0804   TRIG 67.0 03/28/2010 0804   HDL 36.50* 03/28/2010 0804   CHOLHDL 4 03/28/2010 0804   VLDL 13.4 03/28/2010 0804   LDLCALC 79 03/28/2010 0804    HgbA1C:  Lab Results  Component  Value Date   HGBA1C 6.8* 12/31/2011    Urine Drug Screen:   No results found for this basename: labopia, cocainscrnur, labbenz, amphetmu, thcu, labbarb    Alcohol Level: No results found for this basename: ETH:2 in the last 168 hours  Imaging: Dg Chest Port 1v Same Day  01/12/2012  *RADIOLOGY REPORT*  Clinical Data: Respiratory distress  PORTABLE CHEST - 1 VIEW SAME DAY  Comparison: No prior chest x-rays available for comparison.  Findings: The lungs are not well aerated.  There is cardiomegaly present with probable moderate pulmonary vascular congestion.  No focal infiltrate is seen.  No bony abnormalities noted.  IMPRESSION: Poor inspiration with probable moderate pulmonary vascular congestion.   Original Report Authenticated By: Ivar Drape, [name]      Assessment/Plan:  78 year old male with mental status changes.  Patient with cognitive issues at baseline.  May very well have been worsening as an inpatient which is commonly seen, sundowning, etc..  Clinical picture now complicated with the medications that have been used.  Unclear if current lethargy related to Trazodone given last evening or another process.  Patient, as well, has some focality on his exam and is not using his LUE.  Has had some arhythmias during this hospitalization which may have placed him at risk for an ischemic event.  CT performed on 12/17 was marred by motion artifact but did not show an acute event.    Recommendations: 1.  Agree with holding all sedating medications at this time. 2.  MRI of  the brain without contrast.  If MRI unable to be performed would repeat head CT.  3.  Would continue ASA 4.  No overt evidence of seizure.  Would not start an anticonvulsant at this time.   [employer], MD [facility] [phone] 01/12/2012, 2:02 PM

## 2012-01-12 NOTE — Progress Notes (Signed)
[id number]. Patient is still confused ; arousable with pain; pt on O2; reviewed tele strip patient is AF;Family at bedside informed of transfer ; no jerky movements noted so far  Dr. Broadus John notified.

## 2012-01-12 NOTE — Progress Notes (Signed)
[facility] PROGRESS NOTE  Damon [name] [id number] DOB: 1933-08-30 DOA: 12/30/2011 PCP: Arnette Norris, MD  Assessment/Plan:  1. Infectious colitis: Shiga toxin  and bright red blood in stool: Status post colonoscopy on 12/10 which was consistent with infectious etiology,final report 12/12 states shiga toxin positive but Shigella and Salmonella negative. Seen by ID, recommended supportive care only, Antibiotics stopped, no further diarrhea, leukocytosis.       This has resolved 2.   Delirium: mild underlying cognitive dysfunction vs early dementia, upon discussion with daughter for 1-2 months prior he was getting his morning and evening medications mixed up, forgetting things, especially recent memory, now with possible sundowning/delirium. Per nsg staff pt has had some intermittent confusion and agitation. But beginning on pm of 12/14 was more confused and combative and so was started on risperidone. He initially appeared to improved on risperidone, but became drowsy and with increased tone in lower extremities, hence held PM risperdal 12/18, cognitively was clearer yesterday(12/19) but worse 12/20 and also didn't sleep any last pm (12/20). Was given low dose trazodone at 7pm 12/20 to help with sleep, this am lethargic with eyes rolling backwards and jerky movements witnessed by RN, Stop trazodone. Will order MRI brain, check EEG ? Subclinical seizures, CPK was normal so NMS from risperdal unlikely . Neuro consult requested - CT scan of head ordered shows no acute intracranial findings - B12, RPR normal -  UA negative  3.  Acute on chronic renal failure: Resolved, was secondary to dehydration, hypotension and continue use of ACE. Continue holding ACE.   4.  DM : CBG up with D5W, stop IVF, cut down insulin due to poor PO, continue SSI.  A1C 6.8  5.   Metabolic acidosis: Resolved, 2/2 diarrhea, dehydration and acute on chronic kidney disease. Resolved with hydration,   6.  Deconditioning:  PT OT recommending skilled nursing, social work consulted to assist with placement  7. . HTN: Better blood pressure control on hydralazine and imdur. continue holding ACEI  8. ?Arrhythmia: pt with h/o arrhythmia and EKG 12/15 with undetermined rhythm, but the repeat EKG of 12/16 showed normal sinus- cardiac enzymes negative, 2-D echo with EF of 60% no wall motion abnormality  9.Generalized weakness: secondary to dehydration/diarrhea, and deconditioning. PT OT recommending skilled nursing.  10. ?Dysphagia: Swallow evaluation, following., D1 diet, hold diet till mentation improves   DVT prophylaxis: SCDs Code Status: Full Family Communication: Long discussion with Son and daughter at bedside Disposition Plan: SNF when medically stable   Consultants:  GI Youth worker, Dr. Oletta Lamas)  ID-Dr Tommy Medal  Dr. Evern Bio-  Antibiotics:  Flagyl 12/8>>12/11  Rocephin 12/11>>12/12  HPI/Subjective: Had a good day yesterday, more confused this am, hardly got any sleep last pm  Objective: Filed Vitals:   01/11/12 1409 01/11/12 2154 01/12/12 0604 01/12/12 0815  BP: 155/68 151/102 177/88 164/83  Pulse: 90 95 84 74  Temp:  97.6 F (36.4 C) 98.2 F (36.8 C) 98.4 F (36.9 C)  TempSrc:  Axillary Oral Oral  Resp: 16 16 26 27   Height:      Weight:      SpO2: 91% 96% 96% 94%    Intake/Output Summary (Last 24 hours) at 01/12/12 1147 Last data filed at 01/11/12 2300  Gross per 24 hour  Intake 550.83 ml  Output      0 ml  Net 550.83 ml   Filed Weights   01/08/12 2107 01/09/12 2321 01/10/12 2043  Weight: 102.195 kg (225 lb 4.8 oz) 99.3  kg (218 lb 14.7 oz) 100.1 kg (220 lb 10.9 oz)    Exam:   General:  Awake, confused, picking at things, answers some questions  Cardiovascular: S1 and S2, no rubs or gallops  Respiratory: Decreased breath sounds at the bases, otherwise CTA bilaterally  Abdomen: Obese, soft, nontender, no rebound, positive BS; distended  Neuro: no focal or weakness.  Increased tone in both lower ext, L>R, moving all extremities grossly. Follow simple commands.  Data Reviewed: Basic Metabolic Panel:  Lab [id number] 0500 01/10/12 0555 01/09/12 0522 01/08/12 0505 01/07/12 0710 01/05/12 2140  NA 149* 145 148* 149* 147* --  K 3.4* 3.9 3.3* 4.4 4.2 --  CL 113* 111 114* 115* 115* --  CO2 25 22 24 21 20  --  GLUCOSE 193* 278* 143* 141* 164* --  BUN 18 20 16 18 16  --  CREATININE 1.37* 1.23 1.33 1.34 1.32 --  CALCIUM 10.3 10.0 10.0 9.9 9.9 --  MG -- -- -- -- -- 1.9  PHOS -- -- -- -- -- --   Liver Function Tests:  Lab 01/06/12 0900  AST 40*  ALT 35  ALKPHOS 86  BILITOT 0.7  PROT 6.1  ALBUMIN 2.7*    Lab 01/06/12 1515  LIPASE 24  AMYLASE --   CBC:  Lab 01/12/12 0500 01/10/12 0555  WBC 8.4 8.5  NEUTROABS -- --  HGB 13.2 13.5  HCT 39.9 39.1  MCV 85.3 83.9  PLT 152 PLATELET CLUMPS NOTED ON SMEAR, COUNT APPEARS ADEQUATE   CBG:  Lab 01/12/12 0822 01/11/12 1624 01/11/12 1144 01/11/12 0747 01/10/12 2033  GLUCAP 175* 115* 218* 198* 202*    No results found for this or any previous visit (from the past 240 hour(s)).   Studies: Dg Chest Port 1v Same Day  01/12/2012  *RADIOLOGY REPORT*  Clinical Data: Respiratory distress  PORTABLE CHEST - 1 VIEW SAME DAY  Comparison: No prior chest x-rays available for comparison.  Findings: The lungs are not well aerated.  There is cardiomegaly present with probable moderate pulmonary vascular congestion.  No focal infiltrate is seen.  No bony abnormalities noted.  IMPRESSION: Poor inspiration with probable moderate pulmonary vascular congestion.   Original Report Authenticated By: Ivar Drape, [name]     Scheduled Meds:    . aspirin  81 mg Oral Daily  . cinacalcet  30 mg Oral Custom  . erythromycin   Left Eye Q6H  . feeding supplement  1 Container Oral TID BM  . feeding supplement  237 mL Oral TID BM  . hydrALAZINE  50 mg Oral Q8H  . insulin aspart  0-15 Units Subcutaneous TID WC  . insulin aspart  protamine-insulin aspart  15 Units Subcutaneous BID WC  . isosorbide mononitrate  30 mg Oral Daily  . LORazepam      . multivitamin with minerals  1 tablet Oral Daily   Continuous Infusions:    Time spent: 81minutes    [facility]  [facility] Pager [phone]. If 8PM-8AM, please contact night-coverage at [email or web address], password [facility] 01/12/2012, 11:47 AM  LOS: 13 days

## 2012-01-12 NOTE — Progress Notes (Signed)
Called by primary RN to evaluate patient.  Upon arrival to patients room, Rn at bedside.  As per Rn patient appeared to have a glazed over look and was unresponsive.  Patient laying supine in bed, using abdominal muscles to breath and eyes  Slightly deviated upwards.  Abd distended and slight firm.  Patient responded to painful stimuli, did not follow commands, not oriented.  Patient attempted to speak, speech thick and garbled.  Dr. Broadus John at bedside, orders received.  ABG done, results reviewed.  Patient to be transferred to SDU.  RN to call if assistance needed

## 2012-01-13 ENCOUNTER — Inpatient Hospital Stay (HOSPITAL_COMMUNITY): Payer: Medicare Other

## 2012-01-13 LAB — BASIC METABOLIC PANEL
BUN: 21 mg/dL (ref 6–23)
CO2: 28 mEq/L (ref 19–32)
Calcium: 10.4 mg/dL (ref 8.4–10.5)
Chloride: 111 mEq/L (ref 96–112)
Creatinine, Ser: 1.51 mg/dL — ABNORMAL HIGH (ref 0.50–1.35)
GFR calc Af Amer: 50 mL/min — ABNORMAL LOW (ref >90)
GFR calc non Af Amer: 43 mL/min — ABNORMAL LOW (ref >90)
Glucose, Bld: 197 mg/dL — ABNORMAL HIGH (ref 70–99)
Potassium: 3.2 mEq/L — ABNORMAL LOW (ref 3.5–5.1)
Sodium: 150 mEq/L — ABNORMAL HIGH (ref 135–145)

## 2012-01-13 LAB — CBC
HCT: 39.5 % (ref 39.0–52.0)
Hemoglobin: 12.7 g/dL — ABNORMAL LOW (ref 13.0–17.0)
MCH: 27.9 pg (ref 26.0–34.0)
MCHC: 32.2 g/dL (ref 30.0–36.0)
MCV: 86.8 fL (ref 78.0–100.0)
Platelets: 141 10*3/uL — ABNORMAL LOW (ref 150–400)
RBC: 4.55 MIL/uL (ref 4.22–5.81)
RDW: 16.9 % — ABNORMAL HIGH (ref 11.5–15.5)
WBC: 7.4 10*3/uL (ref 4.0–10.5)

## 2012-01-13 LAB — GLUCOSE, CAPILLARY
Glucose-Capillary: 202 mg/dL — ABNORMAL HIGH (ref 70–99)
Glucose-Capillary: 251 mg/dL — ABNORMAL HIGH (ref 70–99)
Glucose-Capillary: 249 mg/dL — ABNORMAL HIGH (ref 70–99)
Glucose-Capillary: 256 mg/dL — ABNORMAL HIGH (ref 70–99)

## 2012-01-13 MED ORDER — POTASSIUM CHLORIDE CRYS ER 20 MEQ PO TBCR: 40.0000 meq | EXTENDED_RELEASE_TABLET | Freq: Once | ORAL | Status: DC

## 2012-01-13 MED ORDER — POTASSIUM CHLORIDE 2 MEQ/ML IV SOLN
INTRAVENOUS | Status: DC
  Administered 2012-01-13: 14:00:00 via INTRAVENOUS
  Filled 2012-01-13 (×2): qty 1000

## 2012-01-13 MED ORDER — POTASSIUM CHLORIDE 20 MEQ/15ML (10%) PO LIQD
40.0000 meq | Freq: Once | ORAL | Status: AC
  Administered 2012-01-13: 40 meq via ORAL
  Filled 2012-01-13: qty 30

## 2012-01-13 NOTE — Progress Notes (Signed)
[facility] PROGRESS NOTE  Damon [name] [id number] DOB: Dec 29, 1933 DOA: 12/30/2011 PCP: Arnette Norris, MD  Assessment/Plan:  1. Infectious colitis: Shiga toxin  and bright red blood in stool: Status post colonoscopy on 12/10 which was consistent with infectious etiology,final report 12/12 states shiga toxin positive but Shigella and Salmonella negative. Seen by ID, recommended supportive care only, Antibiotics stopped, no further diarrhea, leukocytosis.       This has resolved 2.   Delirium: mild underlying cognitive dysfunction vs early dementia, upon discussion with daughter for 1-2 months prior he was getting his morning and evening medications mixed up, forgetting things, especially recent memory, then with possible sundowning/delirium. Per nsg staff pt has had some intermittent confusion and agitation. But beginning on pm of 12/14 was more confused and combative and so was started on risperidone. He initially appeared to improved on risperidone, but became drowsy and with increased tone in lower extremities, hence held PM risperdal 12/18, cognitively was clearer yesterday(12/19) but worse 12/20 and also didn't sleep any last pm (12/20). Was given low dose trazodone at 7pm 12/20 to help with sleep, 12/21 am lethargic with eyes rolling backwards and jerky movements witnessed by RN, Stopped trazodone.  Mentation improved from last pm, awake, quite appropriate MRI brain and EEG pending Greatly appreciate Dr.Reynolds consult - CT scan of head ordered shows no acute intracranial findings - B12, RPR normal -  UA negative  3.  Acute on chronic renal failure: Resolved, was secondary to dehydration, hypotension and continue use of ACE. Continue holding ACE.   4.  DM : CBG up with D5W,  cut down insulin due to poor PO, continue SSI.  A1C 6.8  5.   Metabolic acidosis: Resolved, 2/2 diarrhea, dehydration and acute on chronic kidney disease. Resolved with hydration,   6.  Deconditioning: PT OT  recommending skilled nursing, social work consulted to assist with placement  7. . HTN: Better blood pressure control on hydralazine and imdur. continue holding ACEI  8. ?Arrhythmia: pt with h/o arrhythmia and EKG 12/15 with undetermined rhythm, but the repeat EKG of 12/16 showed normal sinus- cardiac enzymes negative, 2-D echo with EF of 60% no wall motion abnormality  9.Generalized weakness: secondary to dehydration/diarrhea, and deconditioning. PT OT recommending skilled nursing.  10. ?Dysphagia: Swallow evaluation, following., D1 diet, resume diet  11. Hypernatremia: from poor PO intake, will add D5W with KCL at 66ml/hr can stop this once po intake improves   DVT prophylaxis: SCDs Code Status: Full Family Communication: Son and daughter at bedside  Disposition Plan: SNF when medically stable   Consultants:  GI Youth worker, Dr. Oletta Lamas)  ID-Dr Tommy Medal  Dr. Evern Bio-  Antibiotics:  Flagyl 12/8>>12/11  Rocephin 12/11>>12/12  HPI/Subjective: Marked improvement in mentation, awake, alert, interactive  Objective: Filed Vitals:   01/12/12 1600 01/12/12 2057 01/13/12 0052 01/13/12 0422  BP: 136/99 157/61 158/66 165/67  Pulse: 78 72 73 74  Temp: 98 F (36.7 C) 97.6 F (36.4 C) 98.1 F (36.7 C) 98.1 F (36.7 C)  TempSrc: Oral Oral Oral Oral  Resp: 19 18  17   Height:      Weight:    99.5 kg (219 lb 5.7 oz)  SpO2: 97% 96% 98% 96%    Intake/Output Summary (Last 24 hours) at 01/13/12 0751 Last data filed at 01/13/12 0422  Gross per 24 hour  Intake      0 ml  Output   1600 ml  Net  -1600 ml   [employer]  01/10/12 2043 01/12/12 1235 01/13/12 0422  Weight: 100.1 kg (220 lb 10.9 oz) 98.9 kg (218 lb 0.6 oz) 99.5 kg (219 lb 5.7 oz)    Exam:   General:  awake, alert, interactive  Cardiovascular: S1 and S2, no rubs or gallops  Respiratory: Decreased breath sounds at the bases, otherwise CTA bilaterally  Abdomen: Obese, soft, nontender, no rebound, positive BS;  distended  Neuro: no focal or weakness. Tone improved in all extremities esp lower legs,  Follows commands.  Data Reviewed: Basic Metabolic Panel:  Lab [id number] 0520 01/12/12 0500 01/10/12 0555 01/09/12 0522 01/08/12 0505  NA 150* 149* 145 148* 149*  K 3.2* 3.4* 3.9 3.3* 4.4  CL 111 113* 111 114* 115*  CO2 28 25 22 24 21   GLUCOSE 197* 193* 278* 143* 141*  BUN 21 18 20 16 18   CREATININE 1.51* 1.37* 1.23 1.33 1.34  CALCIUM 10.4 10.3 10.0 10.0 9.9  MG -- -- -- -- --  PHOS -- -- -- -- --   Liver Function Tests:  Lab 01/06/12 0900  AST 40*  ALT 35  ALKPHOS 86  BILITOT 0.7  PROT 6.1  ALBUMIN 2.7*    Lab 01/06/12 1515  LIPASE 24  AMYLASE --   CBC:  Lab 01/13/12 0520 01/12/12 0500 01/10/12 0555  WBC 7.4 8.4 8.5  NEUTROABS -- -- --  HGB 12.7* 13.2 13.5  HCT 39.5 39.9 39.1  MCV 86.8 85.3 83.9  PLT 141* 152 PLATELET CLUMPS NOTED ON SMEAR, COUNT APPEARS ADEQUATE   CBG:  Lab 01/12/12 2201 01/12/12 1612 01/12/12 1331 01/12/12 1143 01/12/12 0822  GLUCAP 172* 199* 221* 231* 175*    Recent Results (from the past 240 hour(s))  MRSA PCR SCREENING     Status: Normal   Collection Time   01/12/12 12:48 PM      Component Value Range Status Comment   MRSA by PCR NEGATIVE  NEGATIVE Final      Studies: Dg Chest Port 1v Same Day  01/12/2012  *RADIOLOGY REPORT*  Clinical Data: Respiratory distress  PORTABLE CHEST - 1 VIEW SAME DAY  Comparison: No prior chest x-rays available for comparison.  Findings: The lungs are not well aerated.  There is cardiomegaly present with probable moderate pulmonary vascular congestion.  No focal infiltrate is seen.  No bony abnormalities noted.  IMPRESSION: Poor inspiration with probable moderate pulmonary vascular congestion.   Original Report Authenticated By: Ivar Drape, [name]     Scheduled Meds:    . aspirin  81 mg Oral Daily  . cinacalcet  30 mg Oral Custom  . erythromycin   Left Eye Q6H  . feeding supplement  1 Container Oral TID BM  .  feeding supplement  237 mL Oral TID BM  . hydrALAZINE  50 mg Oral Q8H  . insulin aspart  0-15 Units Subcutaneous TID WC  . insulin aspart protamine-insulin aspart  12 Units Subcutaneous BID WC  . isosorbide mononitrate  30 mg Oral Daily  . multivitamin with minerals  1 tablet Oral Daily  . potassium chloride  40 mEq Oral Once   Continuous Infusions:    . dextrose 5 % 1,000 mL with potassium chloride 20 mEq infusion      Time spent: 51minutes    [facility] Pager [phone]. If 8PM-8AM, please contact night-coverage at [email or web address], password [facility] 01/13/2012, 7:51 AM  LOS: 14 days

## 2012-01-13 NOTE — Progress Notes (Signed)
Subjective: Patient awake and alert today but confused.  Remembers nothing of yesterday.  No complaints.  MRI of the brain performed and shows no acute changes.    Objective: Current vital signs: BP 128/59  Pulse 97  Temp 98.3 F (36.8 C) (Oral)  Resp 20  Ht 5\' 5"  (1.651 m)  Wt 99.5 kg (219 lb 5.7 oz)  BMI 36.50 kg/m2  SpO2 96% Vital signs in last 24 hours: Temp:  [97.6 F (36.4 C)-98.3 F (36.8 C)] 98.3 F (36.8 C) (12/22 1200) Pulse Rate:  [72-97] 97  (12/22 1200) Resp:  [17-20] 20  (12/22 1200) BP: (128-165)/(59-99) 128/59 mmHg (12/22 1200) SpO2:  [96 %-98 %] 96 % (12/22 1200) Weight:  [99.5 kg (219 lb 5.7 oz)] 99.5 kg (219 lb 5.7 oz) (12/22 0422)  Intake/Output from previous day: 12/21 0701 - 12/22 0700 In: -  Out: 1600 [Urine:1600] Intake/Output this shift: Total I/O In: 420 [P.O.:420] Out: 275 [Urine:275] Nutritional status: Dysphagia  Neurologic Exam: Mental Status: Alert.  Does not know the year, how old he or what year he was born in.  Does recognize his family.  Perseverates on commands and ideas. Speech slurred.  Follows simple commands.   Cranial Nerves: II: Discs flat bilaterally; Visual fields grossly normal, pupils equal, round, reactive to light and accommodation III,IV, VI: ptosis not present, extra-ocular motions intact bilaterally V,VII: smile symmetric, facial light touch sensation normal bilaterally VIII: hearing normal bilaterally IX,X: gag reflex present XI: bilateral shoulder shrug XII: midline tongue extension Motor: Moves all extremities against gravity and reasonably symmetrically Sensory: Responds to pinprick throughout Deep Tendon Reflexes: 1+ in the upper extremities and absent in the lower extremities.   Plantars: Right: mute   Left: mute  Lab Results: Basic Metabolic Panel:  Lab [id number] 0520 01/12/12 0500 01/10/12 0555 01/09/12 0522 01/08/12 0505  NA 150* 149* 145 148* 149*  K 3.2* 3.4* 3.9 3.3* 4.4  CL 111 113* 111 114* 115*   CO2 28 25 22 24 21   GLUCOSE 197* 193* 278* 143* 141*  BUN 21 18 20 16 18   CREATININE 1.51* 1.37* 1.23 1.33 1.34  CALCIUM 10.4 10.3 10.0 -- --  MG -- -- -- -- --  PHOS -- -- -- -- --    Liver Function Tests: No results found for this basename: AST:5,ALT:5,ALKPHOS:5,BILITOT:5,PROT:5,ALBUMIN:5 in the last 168 hours  Lab 01/06/12 1515  LIPASE 24  AMYLASE --    Lab 01/12/12 0949 01/06/12 1515  AMMONIA 30 36    CBC:  Lab 01/13/12 0520 01/12/12 0500 01/10/12 0555  WBC 7.4 8.4 8.5  NEUTROABS -- -- --  HGB 12.7* 13.2 13.5  HCT 39.5 39.9 39.1  MCV 86.8 85.3 83.9  PLT 141* 152 PLATELET CLUMPS NOTED ON SMEAR, COUNT APPEARS ADEQUATE    Cardiac Enzymes:  Lab 01/09/12 0522  CKTOTAL 139  CKMB --  CKMBINDEX --  TROPONINI --    Lipid Panel: No results found for this basename: CHOL:5,TRIG:5,HDL:5,CHOLHDL:5,VLDL:5,LDLCALC:5 in the last 168 hours  CBG:  Lab 01/13/12 1220 01/13/12 0759 01/12/12 2201 01/12/12 1612 01/12/12 1331  GLUCAP 251* 202* 172* 199* 221*    Microbiology: Results for orders placed during the hospital encounter of 12/30/11  CLOSTRIDIUM DIFFICILE BY PCR     Status: Normal   Collection Time   12/30/11  9:13 PM      Component Value Range Status Comment   C difficile by pcr NEGATIVE  NEGATIVE Final   STOOL CULTURE     Status: Normal (Preliminary  result)   Collection Time   12/31/11  3:16 PM      Component Value Range Status Comment   Specimen Description STOOL   Final    Special Requests NONE   Final    Culture     Final    Value: Positive for Shiga toxin 2     NO SALMONELLA, SHIGELLA, CAMPYLOBACTER, OR YERSINIA ISOLATED     Note: CRITICAL RESULT CALLED TO, READ BACK BY AND VERIFIED WITH: CHRISTIE S@0747  ON [id number] BY [place] REFERRED TO [place] IN [place] IDENTIFICATION/CONFIRMATION FAXED TO [place] [employer]Susanne Greenhouse 514PM [place]   Report Status PENDING   Incomplete   MRSA PCR SCREENING     Status:  Normal   Collection Time   01/12/12 12:48 PM      Component Value Range Status Comment   MRSA by PCR NEGATIVE  NEGATIVE Final     Coagulation Studies: No results found for this basename: LABPROT:5,INR:5 in the last 72 hours  Imaging: Mr Brain Wo Contrast  01/13/2012  *RADIOLOGY REPORT*  Clinical Data: Mental status change.  Question seizure  MRI HEAD WITHOUT CONTRAST  Technique:  Multiplanar, multiecho pulse sequences of the brain and surrounding structures were obtained according to standard protocol without intravenous contrast.  Comparison: CT 01/08/2012  Findings: Image quality degraded by mild motion.  Negative for acute infarct.  Generalized atrophy is present with minimal chronic ischemic changes.  No cortical infarct.  Negative for hemorrhage or fluid collection.  Negative for mass or edema.  IMPRESSION: No acute intracranial abnormality.   Original Report Authenticated By: Carl Best, [name]    Dg Chest Port 1v Same Day  01/12/2012  *RADIOLOGY REPORT*  Clinical Data: Respiratory distress  PORTABLE CHEST - 1 VIEW SAME DAY  Comparison: No prior chest x-rays available for comparison.  Findings: The lungs are not well aerated.  There is cardiomegaly present with probable moderate pulmonary vascular congestion.  No focal infiltrate is seen.  No bony abnormalities noted.  IMPRESSION: Poor inspiration with probable moderate pulmonary vascular congestion.   Original Report Authenticated By: Ivar Drape, [name]     Medications:  I have reviewed the patient's current medications. Scheduled:   . aspirin  81 mg Oral Daily  . cinacalcet  30 mg Oral Custom  . erythromycin   Left Eye Q6H  . feeding supplement  1 Container Oral TID BM  . feeding supplement  237 mL Oral TID BM  . hydrALAZINE  50 mg Oral Q8H  . insulin aspart  0-15 Units Subcutaneous TID WC  . insulin aspart protamine-insulin aspart  12 Units Subcutaneous BID WC  . isosorbide mononitrate  30 mg Oral Daily  . multivitamin with  minerals  1 tablet Oral Daily    Assessment/Plan:  Confusion state (01/08/2012)   Assessment: may be related to underlying cognitive issues.  MRI of the brain shows no acute changes.  EEG pending.  No reported evidence of seizure.    Plan: 1. EEG in AM  2. Continue holding sedating medications    LOS: 14 days   Alexis Goodell, MD [facility] [phone] 01/13/2012  1:12 PM

## 2012-01-14 ENCOUNTER — Inpatient Hospital Stay (HOSPITAL_COMMUNITY): Payer: Medicare Other

## 2012-01-14 LAB — CBC
HCT: 38 % — ABNORMAL LOW (ref 39.0–52.0)
Hemoglobin: 12.2 g/dL — ABNORMAL LOW (ref 13.0–17.0)
MCH: 28.2 pg (ref 26.0–34.0)
MCHC: 32.1 g/dL (ref 30.0–36.0)
MCV: 88 fL (ref 78.0–100.0)
Platelets: 143 10*3/uL — ABNORMAL LOW (ref 150–400)
RBC: 4.32 MIL/uL (ref 4.22–5.81)
RDW: 16.9 % — ABNORMAL HIGH (ref 11.5–15.5)
WBC: 7.6 10*3/uL (ref 4.0–10.5)

## 2012-01-14 LAB — GLUCOSE, CAPILLARY
Glucose-Capillary: 271 mg/dL — ABNORMAL HIGH (ref 70–99)
Glucose-Capillary: 300 mg/dL — ABNORMAL HIGH (ref 70–99)
Glucose-Capillary: 307 mg/dL — ABNORMAL HIGH (ref 70–99)
Glucose-Capillary: 78 mg/dL (ref 70–99)

## 2012-01-14 LAB — BASIC METABOLIC PANEL
BUN: 27 mg/dL — ABNORMAL HIGH (ref 6–23)
CO2: 30 mEq/L (ref 19–32)
Calcium: 10.3 mg/dL (ref 8.4–10.5)
Chloride: 109 mEq/L (ref 96–112)
Creatinine, Ser: 1.6 mg/dL — ABNORMAL HIGH (ref 0.50–1.35)
GFR calc Af Amer: 47 mL/min — ABNORMAL LOW (ref >90)
GFR calc non Af Amer: 40 mL/min — ABNORMAL LOW (ref >90)
Glucose, Bld: 257 mg/dL — ABNORMAL HIGH (ref 70–99)
Potassium: 3.8 mEq/L (ref 3.5–5.1)
Sodium: 145 mEq/L (ref 135–145)

## 2012-01-14 MED ORDER — SODIUM CHLORIDE 0.9 % IJ SOLN
10.0000 mL | INTRAMUSCULAR | Status: DC | PRN
  Administered 2012-01-14 – 2012-01-15 (×2): 10 mL

## 2012-01-14 MED ORDER — INSULIN ASPART PROT & ASPART (70-30 MIX) 100 UNIT/ML ~~LOC~~ SUSP
15.0000 [IU] | Freq: Two times a day (BID) | SUBCUTANEOUS | Status: DC
  Administered 2012-01-14 – 2012-01-15 (×3): 15 [IU] via SUBCUTANEOUS

## 2012-01-14 NOTE — Progress Notes (Signed)
Subjective: Patient resting but easily awakened.  Follows commands.  EEG performed today shows slowing but no evidence of epileptiform activity.    Objective: Current vital signs: BP 124/69  Pulse 69  Temp 98.2 F (36.8 C) (Axillary)  Resp 20  Ht 5\' 5"  (1.651 m)  Wt 99.5 kg (219 lb 5.7 oz)  BMI 36.50 kg/m2  SpO2 98% Vital signs in last 24 hours: Temp:  [97.8 F (36.6 C)-98.7 F (37.1 C)] 98.2 F (36.8 C) (12/23 1400) Pulse Rate:  [64-87] 69  (12/23 1400) Resp:  [15-23] 20  (12/23 1400) BP: (124-171)/(48-69) 124/69 mmHg (12/23 1400) SpO2:  [96 %-99 %] 98 % (12/23 1400)  Intake/Output from previous day: 12/22 0701 - 12/23 0700 In: 1671.7 [P.O.:840; I.V.:831.7] Out: 926 [Urine:925; Stool:1] Intake/Output this shift: Total I/O In: 530 [P.O.:480; I.V.:50] Out: 700 [Urine:700] Nutritional status: Dysphagia  Neurologic Exam: Mental Status:  Alert. Not fully oriented.  Perseverates on commands and ideas. Speech slurred. Follows simple commands.  Cranial Nerves:  II: Discs flat bilaterally; Visual fields grossly normal, pupils equal, round, reactive to light and accommodation  III,IV, VI: ptosis not present, extra-ocular motions intact bilaterally  V,VII: smile symmetric, facial light touch sensation normal bilaterally  VIII: hearing normal bilaterally  IX,X: gag reflex present  XI: bilateral shoulder shrug  XII: midline tongue extension  Motor:  Moves all extremities against gravity and reasonably symmetrically  Sensory: Responds to pinprick throughout  Deep Tendon Reflexes: 1+ in the upper extremities and absent in the lower extremities.  Plantars:  Right: mute    Left: mute   Lab Results: Basic Metabolic Panel:  Lab 0000000 0400 01/13/12 0520 01/12/12 0500 01/10/12 0555 01/09/12 0522  NA 145 150* 149* 145 148*  K 3.8 3.2* 3.4* 3.9 3.3*  CL 109 111 113* 111 114*  CO2 30 28 25 22 24   GLUCOSE 257* 197* 193* 278* 143*  BUN 27* 21 18 20 16   CREATININE 1.60* 1.51*  1.37* 1.23 1.33  CALCIUM 10.3 10.4 10.3 -- --  MG -- -- -- -- --  PHOS -- -- -- -- --    Liver Function Tests: No results found for this basename: AST:5,ALT:5,ALKPHOS:5,BILITOT:5,PROT:5,ALBUMIN:5 in the last 168 hours No results found for this basename: LIPASE:5,AMYLASE:5 in the last 168 hours  Lab 01/12/12 0949  AMMONIA 30    CBC:  Lab 01/14/12 0400 01/13/12 0520 01/12/12 0500 01/10/12 0555  WBC 7.6 7.4 8.4 8.5  NEUTROABS -- -- -- --  HGB 12.2* 12.7* 13.2 13.5  HCT 38.0* 39.5 39.9 39.1  MCV 88.0 86.8 85.3 83.9  PLT 143* 141* 152 PLATELET CLUMPS NOTED ON SMEAR, COUNT APPEARS ADEQUATE    Cardiac Enzymes:  Lab 01/09/12 0522  CKTOTAL 139  CKMB --  CKMBINDEX --  TROPONINI --    Lipid Panel: No results found for this basename: CHOL:5,TRIG:5,HDL:5,CHOLHDL:5,VLDL:5,LDLCALC:5 in the last 168 hours  CBG:  Lab 01/14/12 1259 01/14/12 0738 01/13/12 2141 01/13/12 1705 01/13/12 1220  GLUCAP 300* 271* 256* 249* 251*    Microbiology: Results for orders placed during the hospital encounter of 12/30/11  CLOSTRIDIUM DIFFICILE BY PCR     Status: Normal   Collection Time   12/30/11  9:13 PM      Component Value Range Status Comment   C difficile by pcr NEGATIVE  NEGATIVE Final   STOOL CULTURE     Status: Normal (Preliminary result)   Collection Time   12/31/11  3:16 PM      Component Value Range Status  Comment   Specimen Description STOOL   Final    Special Requests NONE   Final    Culture     Final    Value: Positive for Shiga toxin 2     NO SALMONELLA, SHIGELLA, CAMPYLOBACTER, OR YERSINIA ISOLATED     Note: CRITICAL RESULT CALLED TO, READ BACK BY AND VERIFIED WITH: CHRISTIE S@0747  ON [id number] BY [place] REFERRED TO [place] IN [place] IDENTIFICATION/CONFIRMATION FAXED TO [facility] [employer]Susanne Greenhouse 514PM [place]   Report Status PENDING   Incomplete   MRSA PCR SCREENING     Status: Normal   Collection Time   01/12/12 12:48 PM       Component Value Range Status Comment   MRSA by PCR NEGATIVE  NEGATIVE Final     Coagulation Studies: No results found for this basename: LABPROT:5,INR:5 in the last 72 hours  Imaging: Mr Brain Wo Contrast  01/13/2012  *RADIOLOGY REPORT*  Clinical Data: Mental status change.  Question seizure  MRI HEAD WITHOUT CONTRAST  Technique:  Multiplanar, multiecho pulse sequences of the brain and surrounding structures were obtained according to standard protocol without intravenous contrast.  Comparison: CT 01/08/2012  Findings: Image quality degraded by mild motion.  Negative for acute infarct.  Generalized atrophy is present with minimal chronic ischemic changes.  No cortical infarct.  Negative for hemorrhage or fluid collection.  Negative for mass or edema.  IMPRESSION: No acute intracranial abnormality.   Original Report Authenticated By: Carl Best, [name]    Dg Chest Portable 1 View  01/13/2012  *RADIOLOGY REPORT*  Clinical Data: PICC line placement.  PORTABLE CHEST - 1 VIEW  Comparison: One-view chest 01/12/2012.  Findings: A right-sided PICC line is in place.  The tip is at the cavoatrial junction.  The heart is enlarged.  Lung volumes are low. Bibasilar atelectasis is present.  Mild interstitial disease is slightly improved.  IMPRESSION:  1.  The tip of the new right-sided PICC line is at the cavoatrial junction. 2.  Stable cardiomegaly with slight decrease in interstitial edema. 3.  Persistent low lung volumes with mild bibasilar atelectasis.   Original Report Authenticated By: [name], [name]     Medications:  I have reviewed the patient's current medications. Scheduled:   . aspirin  81 mg Oral Daily  . cinacalcet  30 mg Oral Custom  . erythromycin   Left Eye Q6H  . feeding supplement  1 Container Oral TID BM  . feeding supplement  237 mL Oral TID BM  . hydrALAZINE  50 mg Oral Q8H  . insulin aspart  0-15 Units Subcutaneous TID WC  . insulin aspart protamine-insulin aspart   15 Units Subcutaneous BID WC  . isosorbide mononitrate  30 mg Oral Daily  . multivitamin with minerals  1 tablet Oral Daily    Assessment/Plan:   Confusion state (01/08/2012)   Assessment: Patient with altered mental status.  MRI shows no acute lesions.  EEG shows no epileptiform activity.  Patient continues to hhave multiple metabolic issues including elevated sodium and elevated renal function.  Has baseline cognitive issues and has been hospitalized for quite some time.  Suspect mental status related to such    Plan: No further neurologic testing recommended at this time.  Would continue to limit sedating medications.       LOS: 15 days   Alexis Goodell, MD [facility] [phone] 01/14/2012  4:38 PM

## 2012-01-14 NOTE — Progress Notes (Signed)
Speech Language Pathology Dysphagia Treatment Patient Details Name: Damon [name] MRN: [id number] DOB: 1933-05-13 Today's Date: 01/14/2012 Time: [id number] SLP Time Calculation (min): 15 min  Assessment / Plan / Recommendation Clinical Impression  Pt observed with thin liquids and graham cracker. Pt able to sustain attention to PO though tactile cues and verbal cues required to reduce bolus size due to impulsivity. Pt does not have dentures present and struggled to masticate solids though he did attend to bolus and manipulated adequately in the oral cavity. No overt signs of aspriation were observed though subjective observation does suggest delay in swallow response with increased risk of aspiration with large bolus. Recommend pt upgrade to a dys 2 (fine chop) diet with thin liquids and no straws. Signage placed. If pt can have dentures, may be able to upgrade diet further.     Diet Recommendation  Initiate / Change Diet: Dysphagia 2 (fine chop);Thin liquid    SLP Plan Continue with current plan of care   Pertinent Vitals/Pain NA   Swallowing Goals  SLP Swallowing Goals Patient will consume recommended diet without observed clinical signs of aspiration with: Minimal assistance Swallow Study Goal #1 - Progress: Progressing toward goal Patient will utilize recommended strategies during swallow to increase swallowing safety with: Moderate cueing Swallow Study Goal #2 - Progress: Progressing toward goal Goal #3: Pt. will maintain alertness and awarenss to timely transit bolus and initiate swallow with minimal s/s aspiration with mod verbal/visual cues. Swallow Study Goal #3 - Progress: Met  General Temperature Spikes Noted: No Respiratory Status: Supplemental O2 delivered via (comment) Behavior/Cognition: Distractible;Requires cueing;Alert;Confused Oral Cavity - Dentition: Edentulous Patient Positioning: Upright in chair  Oral Cavity - Oral Hygiene Does patient have any of the following  "at risk" factors?: Oxygen therapy - cannula, mask, simple oxygen devices Brush patient's teeth BID with toothbrush (using toothpaste with fluoride): No (Comment) Patient is AT RISK - Oral Care Protocol followed (see row info): Yes   Dysphagia Treatment Treatment focused on: Skilled observation of diet tolerance;Upgraded PO texture trials;Facilitation of oral preparatory phase Treatment Methods/Modalities: Skilled observation Patient observed directly with PO's: Yes Type of PO's observed: Dysphagia 3 (soft);Thin liquids Feeding: Needs set up;Needs assist Liquids provided via: Cup;Straw Oral Phase Signs & Symptoms: Prolonged bolus formation;Prolonged oral phase;Prolonged mastication Pharyngeal Phase Signs & Symptoms: Suspected delayed swallow initiation Type of cueing: Verbal;Visual;Tactile Amount of cueing: Moderate   GO     Elia Nunley, Katherene Ponto 01/14/2012, 9:52 AM

## 2012-01-14 NOTE — Progress Notes (Signed)
EEG COMPLETED

## 2012-01-14 NOTE — Progress Notes (Signed)
Report called, pt transferring to 6N05 via bed with belongings. Suezanne Cheshire

## 2012-01-14 NOTE — Progress Notes (Signed)
Physical Therapy Treatment Patient Details Name: Damon [name] MRN: [id number] DOB: 12-19-1933 Today's Date: 01/14/2012 Time: [id number] PT Time Calculation (min): 28 min  PT Assessment / Plan / Recommendation Comments on Treatment Session  Pt admitted with kidney issues and colitis. Pt with confusion during hospital stay. However, pt willing and able to participate with therapy today. Able to increase independence with mobility during session.    Follow Up Recommendations  SNF     Does the patient have the potential to tolerate intense rehabilitation     Barriers to Discharge        Equipment Recommendations  Rolling walker with 5" wheels    Recommendations for Other Services    Frequency Min 3X/week   Plan Frequency remains appropriate;Discharge plan needs to be updated    Precautions / Restrictions Precautions Precautions: Fall Restrictions Weight Bearing Restrictions: No   Pertinent Vitals/Pain None    Mobility  Bed Mobility Bed Mobility: Rolling Right;Right Sidelying to Sit Rolling Right: 1: +2 Total assist;With rail Rolling Right: Patient Percentage: 50% Right Sidelying to Sit: 1: +2 Total assist;With rails Right Sidelying to Sit: Patient Percentage: 50% Details for Bed Mobility Assistance: Assist to facilitate rotation of trunk with reaching left UE across midline toward rail as well as for trunk to translate to midline from side with pushing up with bilateral UEs. Assist also to bilateral LEs to flex and translate off EOB. Cues for sequence. Transfers Transfers: Sit to Stand;Stand to Sit (2 trials.) Sit to Stand: 1: +2 Total assist;From bed Sit to Stand: Patient Percentage: 40% Stand to Sit: 1: +2 Total assist;To bed;To chair/3-in-1 Stand to Sit: Patient Percentage: 40% Details for Transfer Assistance: Assist to translate trunk anterior over BOS with cues for sequence and safe hand placement.  First attempt only able to maintain for 10 seconds. Second attempt for  2-3 mins. Blocked bilateral kness to prevent buckling with facilitation at sacrum/chest to increase extension of trunk. Ambulation/Gait Ambulation/Gait Assistance: 1: +2 Total assist Ambulation/Gait: Patient Percentage: 40% Ambulation Distance (Feet): 2 Feet Assistive device: None Ambulation/Gait Assistance Details: Assist to shift weight right and left in order to advance bilateral LEs. Continued assist at sacrum/chest for trunk extension as well as for balance throughout. Gait Pattern: Step-to pattern;Decreased stride length;Trunk flexed;Narrow base of support Stairs: No Wheelchair Mobility Wheelchair Mobility: No    Exercises General Exercises - Lower Extremity Ankle Circles/Pumps: AROM;Both;5 reps;Supine Long Arc Quad: AROM;Both;5 reps;Seated   PT Diagnosis:    PT Problem List:   PT Treatment Interventions:     PT Goals Acute Rehab PT Goals PT Goal Formulation: With patient Time For Goal Achievement: 01/28/12 Potential to Achieve Goals: Good Pt will go Supine/Side to Sit: with mod assist PT Goal: Supine/Side to Sit - Progress: Progressing toward goal Pt will go Sit to Supine/Side: with mod assist PT Goal: Sit to Supine/Side - Progress: Progressing toward goal Pt will go Sit to Stand: with mod assist PT Goal: Sit to Stand - Progress: Progressing toward goal Pt will go Stand to Sit: with mod assist PT Goal: Stand to Sit - Progress: Progressing toward goal Pt will Transfer Bed to Chair/Chair to Bed: with mod assist PT Transfer Goal: Bed to Chair/Chair to Bed - Progress: Progressing toward goal Pt will Ambulate: 16 - 50 feet;with mod assist;with least restrictive assistive device PT Goal: Ambulate - Progress: Progressing toward goal  Visit Information  Last PT Received On: 01/14/12 Assistance Needed: +2 PT/OT Co-Evaluation/Treatment: Yes    Subjective Data  Subjective: "  I'm feeling ok." Patient Stated Goal: none stated   Cognition  Overall Cognitive Status:  Impaired Area of Impairment: Attention;Following commands;Problem solving Arousal/Alertness: Awake/alert Orientation Level: Disoriented to;Time;Situation Behavior During Session: [facility] for tasks performed Current Attention Level: Sustained Following Commands: Follows one step commands with increased time Problem Solving: Difficulty problem solving with cues to assist.  Cognition - Other Comments: Slow to process information.    Balance  Balance Balance Assessed: Yes Static Sitting Balance Static Sitting - Balance Support: Bilateral upper extremity supported Static Sitting - Level of Assistance: 5: Stand by assistance Static Sitting - Comment/# of Minutes: Pt physical assist with pt able to sit for 10 minutes total. Static Standing Balance Static Standing - Balance Support: Right upper extremity supported;Left upper extremity supported Static Standing - Level of Assistance: 1: +2 Total assist;Patient percentage (comment) (40%) Static Standing - Comment/# of Minutes: Pt able to stand for 2-3 minutes with Total assist +2 (pt=40%). Assist to increase trunk extension and for balance.  End of Session PT - End of Session Equipment Utilized During Treatment: Gait belt Activity Tolerance: Patient tolerated treatment well;Patient limited by fatigue Patient left: in chair;with call bell/phone within reach Nurse Communication: Mobility status   GP     Cyndia Bent 01/14/2012, 8:58 AM  01/14/2012 Cyndia Bent, PT, DPT [phone]

## 2012-01-14 NOTE — Progress Notes (Signed)
Inpatient Diabetes Program Recommendations  AACE/ADA: New Consensus Statement on Inpatient Glycemic Control (2013)  Target Ranges:  Prepandial:   less than 140 mg/dL      Peak postprandial:   less than 180 mg/dL (1-2 hours)      Critically ill patients:  140 - 180 mg/dL   Results for BETTIE, QUEBODEAUX (MRN [id number]) as of 01/14/2012 12:30  Ref. Range 01/13/2012 07:59 01/13/2012 12:20 01/13/2012 17:05 01/13/2012 21:41  Glucose-Capillary Latest Range: 70-99 mg/dL 202 (H) 251 (H) 249 (H) 256 (H)   Noted 70/30 insulin increased today to 15 units bid with meals.  Patient takes fairly large doses of 70/30 at home (50 units in the AM and 40 units in the PM at home)    Note: Will follow. Wyn Quaker RN, MSN, CDE Diabetes Coordinator Inpatient Diabetes Program [phone]

## 2012-01-14 NOTE — Progress Notes (Addendum)
[facility] PROGRESS NOTE  Damon [name] [id number] DOB: 07/06/1933 DOA: 12/30/2011 PCP: Arnette Norris, MD  Assessment/Plan:  1. Infectious colitis: Shiga toxin  and bright red blood in stool: Status post colonoscopy on 12/10 which was consistent with infectious etiology,final report 12/12 states shiga toxin positive but Shigella and Salmonella negative. Seen by ID, recommended supportive care only, Antibiotics stopped, no further diarrhea, leukocytosis.       This has resolved 2.   Delirium: Underlying cognitive dysfunction vs early dementia, upon discussion with daughter for 1-2 months prior he was getting his morning and evening medications mixed up, forgetting things, especially recent memory, then with possible sundowning/delirium. Per nsg staff pt has had some intermittent confusion and agitation. But beginning on pm of 12/14 was more confused and combative and so was started on risperidone. He initially appeared to improved on risperidone, but became drowsy and with increased tone in lower extremities, hence held PM risperdal 12/18, cognitively was clearer yesterday(12/19) but worse 12/20 and also didn't sleep any last pm (12/20). Was given low dose trazodone at 7pm 12/20 to help with sleep, 12/21 am lethargic with eyes rolling backwards and jerky movements witnessed by RN, Stopped trazodone.  Mentation improved from 12/21 pm, awake, quite appropriate but intermittently confused MRI brain negative, EEG pending Greatly appreciate Dr.Reynolds consult - CT scan of head ordered shows no acute intracranial findings - B12, RPR normal - UA negative  3.  Acute on chronic renal failure: Resolved, was secondary to dehydration, hypotension and continue use of ACE. Continue holding ACE.   4.  DM : CBG up with D5W,  Increase insulin, continue SSI.  A1C 6.8  5.   Metabolic acidosis: Resolved, 2/2 diarrhea, dehydration and acute on chronic kidney disease. Resolved with hydration,   6.   Deconditioning: PT OT recommending skilled nursing, social work consulted to assist with placement  7. . HTN: Better blood pressure control on hydralazine and imdur. continue holding ACEI  8. ?Arrhythmia: pt with h/o arrhythmia and EKG 12/15 with undetermined rhythm, but the repeat EKG of 12/16 showed normal sinus- cardiac enzymes negative, 2-D echo with EF of 60% no wall motion abnormality  9.Generalized weakness: secondary to dehydration/diarrhea, and deconditioning. PT OT recommending skilled nursing.  10. ?Dysphagia: Swallow evaluation, following., D1 diet, speech following, ? Upgrade diet  11. Hypernatremia: from poor PO intake, improved, stop IVF, encourage PO   DVT prophylaxis: SCDs Code Status: Full Family Communication: Son and daughter at bedside  Disposition Plan: SNF when medically stable, ? tomorrow   Consultants:  GI Youth worker, Dr. Oletta Lamas)  ID-Dr Tommy Medal  Dr. Evern Bio-  Antibiotics:  Flagyl 12/8>>12/11  Rocephin 12/11>>12/12  HPI/Subjective: Mentation remains stable since yesterday, more interactive, confused  Objective: Filed Vitals:   01/13/12 1600 01/13/12 2039 01/14/12 0011 01/14/12 0743  BP: 142/68 138/64 140/57   Pulse:   69   Temp: 97.9 F (36.6 C) 98.7 F (37.1 C) 97.9 F (36.6 C) 97.8 F (36.6 C)  TempSrc: Oral Axillary Axillary Axillary  Resp: 20 23 20    Height:      Weight:      SpO2:  99% 99%     Intake/Output Summary (Last 24 hours) at 01/14/12 0752 Last data filed at 01/14/12 0700  Gross per 24 hour  Intake 1671.67 ml  Output    926 ml  Net 745.67 ml   Filed Weights   01/10/12 2043 01/12/12 1235 01/13/12 0422  Weight: 100.1 kg (220 lb 10.9 oz) 98.9 kg (218  lb 0.6 oz) 99.5 kg (219 lb 5.7 oz)    Exam:   General:  awake, alert, interactive, oriented to self, place, confused in between  Cardiovascular: S1 and S2, no rubs or gallops  Respiratory: Decreased breath sounds at the bases, otherwise CTA bilaterally  Abdomen: Obese,  soft, nontender, no rebound, positive BS; distended  Neuro: no focal or weakness. Tone improved in all extremities esp lower legs,  Follows commands.  Data Reviewed: Basic Metabolic Panel:  Lab 0000000 0400 01/13/12 0520 01/12/12 0500 01/10/12 0555 01/09/12 0522  NA 145 150* 149* 145 148*  K 3.8 3.2* 3.4* 3.9 3.3*  CL 109 111 113* 111 114*  CO2 30 28 25 22 24   GLUCOSE 257* 197* 193* 278* 143*  BUN 27* 21 18 20 16   CREATININE 1.60* 1.51* 1.37* 1.23 1.33  CALCIUM 10.3 10.4 10.3 10.0 10.0  MG -- -- -- -- --  PHOS -- -- -- -- --   Liver Function Tests: No results found for this basename: AST:5,ALT:5,ALKPHOS:5,BILITOT:5,PROT:5,ALBUMIN:5 in the last 168 hours No results found for this basename: LIPASE:5,AMYLASE:5 in the last 168 hours CBC:  Lab 01/14/12 0400 01/13/12 0520 01/12/12 0500 01/10/12 0555  WBC 7.6 7.4 8.4 8.5  NEUTROABS -- -- -- --  HGB 12.2* 12.7* 13.2 13.5  HCT 38.0* 39.5 39.9 39.1  MCV 88.0 86.8 85.3 83.9  PLT 143* 141* 152 PLATELET CLUMPS NOTED ON SMEAR, COUNT APPEARS ADEQUATE   CBG:  Lab 01/14/12 0738 01/13/12 2141 01/13/12 1705 01/13/12 1220 01/13/12 0759  GLUCAP 271* 256* 249* 251* 202*    Recent Results (from the past 240 hour(s))  MRSA PCR SCREENING     Status: Normal   Collection Time   01/12/12 12:48 PM      Component Value Range Status Comment   MRSA by PCR NEGATIVE  NEGATIVE Final      Studies: Mr Brain Wo Contrast  01/13/2012  *RADIOLOGY REPORT*  Clinical Data: Mental status change.  Question seizure  MRI HEAD WITHOUT CONTRAST  Technique:  Multiplanar, multiecho pulse sequences of the brain and surrounding structures were obtained according to standard protocol without intravenous contrast.  Comparison: CT 01/08/2012  Findings: Image quality degraded by mild motion.  Negative for acute infarct.  Generalized atrophy is present with minimal chronic ischemic changes.  No cortical infarct.  Negative for hemorrhage or fluid collection.  Negative for  mass or edema.  IMPRESSION: No acute intracranial abnormality.   Original Report Authenticated By: Carl Best, [name]    Dg Chest Portable 1 View  01/13/2012  *RADIOLOGY REPORT*  Clinical Data: PICC line placement.  PORTABLE CHEST - 1 VIEW  Comparison: One-view chest 01/12/2012.  Findings: A right-sided PICC line is in place.  The tip is at the cavoatrial junction.  The heart is enlarged.  Lung volumes are low. Bibasilar atelectasis is present.  Mild interstitial disease is slightly improved.  IMPRESSION:  1.  The tip of the new right-sided PICC line is at the cavoatrial junction. 2.  Stable cardiomegaly with slight decrease in interstitial edema. 3.  Persistent low lung volumes with mild bibasilar atelectasis.   Original Report Authenticated By: [name], [name]    Dg Chest Port 1v Same Day  01/12/2012  *RADIOLOGY REPORT*  Clinical Data: Respiratory distress  PORTABLE CHEST - 1 VIEW SAME DAY  Comparison: No prior chest x-rays available for comparison.  Findings: The lungs are not well aerated.  There is cardiomegaly present with probable moderate pulmonary vascular congestion.  No focal  infiltrate is seen.  No bony abnormalities noted.  IMPRESSION: Poor inspiration with probable moderate pulmonary vascular congestion.   Original Report Authenticated By: Ivar Drape, [name]     Scheduled Meds:    . aspirin  81 mg Oral Daily  . cinacalcet  30 mg Oral Custom  . erythromycin   Left Eye Q6H  . feeding supplement  1 Container Oral TID BM  . feeding supplement  237 mL Oral TID BM  . hydrALAZINE  50 mg Oral Q8H  . insulin aspart  0-15 Units Subcutaneous TID WC  . insulin aspart protamine-insulin aspart  15 Units Subcutaneous BID WC  . isosorbide mononitrate  30 mg Oral Daily  . multivitamin with minerals  1 tablet Oral Daily   Continuous Infusions:    Time spent: 54minutes    [facility]  [facility] Pager [phone]. If 8PM-8AM, please contact night-coverage at [email or web address],  password [facility] 01/14/2012, 7:52 AM  LOS: 15 days

## 2012-01-14 NOTE — Progress Notes (Signed)
Patient transferred to [facility] from [facility].  Drowsy, easily aroused. Oriented to room. Reminded to call for assistance. Call bell in reach.

## 2012-01-14 NOTE — Progress Notes (Signed)
Occupational Therapy Treatment Patient Details Name: Damon [name] MRN: [id number] DOB: 19-Dec-1933 Today's Date: 01/14/2012 Time: [id number] OT Time Calculation (min): 25 min  OT Assessment / Plan / Recommendation Comments on Treatment Session Pt better compared to previous sessions, however still needs total +2 assist for bed mobility, transfers, and selfcare sit to stand.  Will need extensive SNF level rehab at discharge.    Follow Up Recommendations  SNF       Equipment Recommendations  None recommended by OT       Frequency Min 2X/week   Plan Discharge plan needs to be updated    Precautions / Restrictions Precautions Precautions: Fall Restrictions Weight Bearing Restrictions: No   Pertinent Vitals/Pain No report of pain O2 sats 98% on room air, HR 85 BPM and stable    ADL  Eating/Feeding: Performed;Minimal assistance Where Assessed - Eating/Feeding: Chair Grooming: Performed;Set up;Wash/dry face Where Assessed - Grooming: Unsupported sitting Toilet Transfer: Simulated;+2 Total assistance Toilet Transfer: Patient Percentage: 40% Toilet Transfer Method: Stand pivot Toilet Transfer Equipment: Other (comment) (To bedside chair, pt with cathetor) Toileting - Clothing Manipulation and Hygiene: Simulated;+2 Total assistance Toileting - Clothing Manipulation and Hygiene: Patient Percentage: 40% Where Assessed - Toileting Clothing Manipulation and Hygiene: Other (comment) (sit to stand from the EOB) Transfers/Ambulation Related to ADLs: Pt required total assist +2 (pt 40%) for sit to stand from EOB and for stand pivot to bedside chair. ADL Comments: Pt more alert and cooperative this session.  Not oriented to time or situation but following one step commands consistently.  In standing demonstrates decreased hip and lumbar extension, using bed to stabilize his LEs.  Decreased ability also noted for weightshifting in order to adequately take steps for pivot transfer.    OT  Goals Acute Rehab OT Goals OT Goal Formulation: With patient Time For Goal Achievement: 01/28/12 Potential to Achieve Goals: Fair ADL Goals Pt Will Perform Grooming: with supervision;Unsupported;Sitting, edge of bed;Other (comment) (3 tasks) ADL Goal: Grooming - Progress: Revised due to lack of progress Pt Will Perform Upper Body Bathing: with supervision;Sitting, edge of bed;Unsupported ADL Goal: Upper Body Bathing - Progress: Revised due to lack of progress Pt Will Perform Lower Body Bathing: with min assist;Sit to stand from bed;Supported ADL Goal: Lower Body Bathing - Progress: Revised due to lack of progress Pt Will Perform Upper Body Dressing: with set-up;Unsupported;Sitting, bed ADL Goal: Upper Body Dressing - Progress: Revised due to lack of progress Pt Will Perform Lower Body Dressing: with mod assist;with adaptive equipment;Sit to stand from bed ADL Goal: Lower Body Dressing - Progress: Revised due to lack of progress Pt Will Transfer to Toilet: with min assist;3-in-1;with DME ADL Goal: Toilet Transfer - Progress: Revised due to lack of progress Pt Will Perform Toileting - Clothing Manipulation: with min assist;Sitting on 3-in-1 or toilet;Standing ADL Goal: Toileting - Clothing Manipulation - Progress: Revised due to lack of progress Pt Will Perform Toileting - Hygiene: with min assist;Sit to stand from 3-in-1/toilet ADL Goal: Toileting - Hygiene - Progress: Goal set today Miscellaneous OT Goals Miscellaneous OT Goal #1: Pt will transfer from supine to sit EOB with min assist in preparation for self care tasks. OT Goal: Miscellaneous Goal #1 - Progress: Revised due to lack of progress  Visit Information  Last OT Received On: 01/14/12 Assistance Needed: +2 PT/OT Co-Evaluation/Treatment: Yes    Subjective Data  Subjective: I don't know?  ( when asked what the date was) Patient Stated Goal: Did not state.  Cognition  Overall Cognitive Status: Impaired Area of  Impairment: Memory Arousal/Alertness: Awake/alert Orientation Level: Disoriented to;Time;Situation Behavior During Session: WFL for tasks performed Current Attention Level: Sustained Following Commands: Follows one step commands consistently Problem Solving: Difficulty problem solving with cues to assist.  Cognition - Other Comments: Slow to process information.    Mobility  Shoulder Instructions Bed Mobility Bed Mobility: Rolling Right;Right Sidelying to Sit Rolling Right: 1: +2 Total assist;With rail Rolling Right: Patient Percentage: 50% Right Sidelying to Sit: 1: +2 Total assist;With rails Right Sidelying to Sit: Patient Percentage: 50% Details for Bed Mobility Assistance: Assist to facilitate rotation of trunk with reaching left UE across midline toward rail as well as for trunk to translate to midline from side with pushing up with bilateral UEs. Assist also to bilateral LEs to flex and translate off EOB. Cues for sequence. Transfers Transfers: Sit to Stand Sit to Stand: 1: +2 Total assist;From bed Sit to Stand: Patient Percentage: 40% Stand to Sit: 1: +2 Total assist;To bed;To chair/3-in-1 Stand to Sit: Patient Percentage: 40% Details for Transfer Assistance: Assist to translate trunk anterior over BOS with cues for sequence and safe hand placement.  First attempt only able to maintain for 10 seconds. Second attempt for 2-3 mins. Blocked bilateral kness to prevent buckling with facilitation at sacrum/chest to increase extension of trunk.          Balance Balance Balance Assessed: Yes Static Sitting Balance Static Sitting - Balance Support: Bilateral upper extremity supported Static Sitting - Level of Assistance: 5: Stand by assistance Static Sitting - Comment/# of Minutes: Pt physical assist with pt able to sit for 10 minutes total. Static Standing Balance Static Standing - Balance Support: Right upper extremity supported;Left upper extremity supported Static Standing -  Level of Assistance: 1: +2 Total assist;Patient percentage (comment) (40%) Static Standing - Comment/# of Minutes: Pt able to stand for 2-3 minutes with Total assist +2 (pt=40%). Assist to increase trunk extension and for balance.   End of Session OT - End of Session Equipment Utilized During Treatment: Gait belt Activity Tolerance: Patient limited by fatigue Patient left: in chair;with call bell/phone within reach;with nursing in room;Other (comment) (nurse tech to assist with breakfast) Nurse Communication: Mobility status     [place] OTR/L Pager number [phone] 01/14/2012, 8:59 AM

## 2012-01-15 DIAGNOSIS — N189 Chronic kidney disease, unspecified: Secondary | ICD-10-CM

## 2012-01-15 DIAGNOSIS — R410 Disorientation, unspecified: Secondary | ICD-10-CM | POA: Diagnosis present

## 2012-01-15 DIAGNOSIS — N179 Acute kidney failure, unspecified: Secondary | ICD-10-CM

## 2012-01-15 DIAGNOSIS — N058 Unspecified nephritic syndrome with other morphologic changes: Secondary | ICD-10-CM

## 2012-01-15 LAB — CBC
HCT: 37.7 % — ABNORMAL LOW (ref 39.0–52.0)
Hemoglobin: 12.1 g/dL — ABNORMAL LOW (ref 13.0–17.0)
MCH: 27.9 pg (ref 26.0–34.0)
MCHC: 32.1 g/dL (ref 30.0–36.0)
MCV: 86.9 fL (ref 78.0–100.0)
Platelets: 144 10*3/uL — ABNORMAL LOW (ref 150–400)
RBC: 4.34 MIL/uL (ref 4.22–5.81)
RDW: 16.6 % — ABNORMAL HIGH (ref 11.5–15.5)
WBC: 7.2 10*3/uL (ref 4.0–10.5)

## 2012-01-15 LAB — BASIC METABOLIC PANEL
BUN: 23 mg/dL (ref 6–23)
CO2: 29 mEq/L (ref 19–32)
Calcium: 10.5 mg/dL (ref 8.4–10.5)
Chloride: 110 mEq/L (ref 96–112)
Creatinine, Ser: 1.53 mg/dL — ABNORMAL HIGH (ref 0.50–1.35)
GFR calc Af Amer: 49 mL/min — ABNORMAL LOW (ref >90)
GFR calc non Af Amer: 42 mL/min — ABNORMAL LOW (ref >90)
Glucose, Bld: 168 mg/dL — ABNORMAL HIGH (ref 70–99)
Potassium: 3.4 mEq/L — ABNORMAL LOW (ref 3.5–5.1)
Sodium: 147 mEq/L — ABNORMAL HIGH (ref 135–145)

## 2012-01-15 LAB — GLUCOSE, CAPILLARY
Glucose-Capillary: 169 mg/dL — ABNORMAL HIGH (ref 70–99)
Glucose-Capillary: 171 mg/dL — ABNORMAL HIGH (ref 70–99)

## 2012-01-15 MED ORDER — ISOSORBIDE MONONITRATE ER 30 MG PO TB24: 30.0000 mg | ORAL_TABLET | Freq: Every day | ORAL | Status: DC

## 2012-01-15 MED ORDER — HYDRALAZINE HCL 50 MG PO TABS: 50.0000 mg | ORAL_TABLET | Freq: Three times a day (TID) | ORAL | Status: DC

## 2012-01-15 MED ORDER — INSULIN NPH ISOPHANE & REGULAR (70-30) 100 UNIT/ML ~~LOC~~ SUSP: 16.0000 [IU] | Freq: Two times a day (BID) | SUBCUTANEOUS | Status: DC

## 2012-01-15 MED ORDER — ACETAMINOPHEN 325 MG PO TABS: 650.0000 mg | ORAL_TABLET | Freq: Four times a day (QID) | ORAL | Status: DC | PRN

## 2012-01-15 NOTE — Discharge Summary (Signed)
Physician Discharge Summary  Patient ID: Damon [name] MRN: [id number] DOB/AGE: 76-Oct-1935 76 y.o.  Admit date: 12/30/2011 Discharge date: 01/15/2012  Primary Care Physician:  Arnette Norris, MD   Disposition and Follow-up:  PCP in 1 week Will need furher titration of Insulin Resume ACE inhibitor in 1 week if BP tolerates Bmet in 1 week    Discharge Diagnoses:    Shigella gastroenteritis  Shiga toxin-producing Escherichia coli infection  Delirium  Suspected early dementia vs Mild cognitive dysfunction  Diabetes mellitus with kidney disease  DIABETIC PERIPHERAL NEUROPATHY  HYPERTRIGLYCERIDEMIA  OBESITY  HYPERTENSION  Bradycardia  Acute-on-chronic kidney injury  Dehydration  Confusion state       Medication List     As of 01/15/2012 11:12 AM    STOP taking these medications         lisinopril 20 MG tablet   Commonly known as: PRINIVIL,ZESTRIL      TAKE these medications         acetaminophen 325 MG tablet   Commonly known as: TYLENOL   Take 2 tablets (650 mg total) by mouth every 6 (six) hours as needed (or Fever >/= 101).      aspirin 81 MG tablet   Take 81 mg by mouth daily.      cinacalcet 30 MG tablet   Commonly known as: SENSIPAR   Take 30 mg by mouth 2 (two) times a week. Takes on Wed and Sun      furosemide 40 MG tablet   Commonly known as: LASIX   Take 40 mg by mouth every other day.      gemfibrozil 600 MG tablet   Commonly known as: LOPID   Take 600 mg by mouth 2 (two) times daily before a meal.      hydrALAZINE 50 MG tablet   Commonly known as: APRESOLINE   Take 1 tablet (50 mg total) by mouth every 8 (eight) hours.      insulin NPH-insulin regular (70-30) 100 UNIT/ML injection   Commonly known as: NOVOLIN 70/30   Inject 16 Units into the skin 2 (two) times daily with a meal. Takes 50 units in the am & 40 units in the pm      isosorbide mononitrate 30 MG 24 hr tablet   Commonly known as: IMDUR   Take 1 tablet (30 mg total) by mouth  daily.          Consults:   1. GI DR.Edwards 2. ID, Dr.VanDam 3. NEurology Dr.Reynolds  Significant Diagnostic Studies:  Dg Chest Portable 1 View  01/13/2012  *RADIOLOGY REPORT*  Clinical Data: PICC line placement.  PORTABLE CHEST - 1 VIEW  Comparison: One-view chest 01/12/2012.  Findings: A right-sided PICC line is in place.  The tip is at the cavoatrial junction.  The heart is enlarged.  Lung volumes are low. Bibasilar atelectasis is present.  Mild interstitial disease is slightly improved.  IMPRESSION:  1.  The tip of the new right-sided PICC line is at the cavoatrial junction. 2.  Stable cardiomegaly with slight decrease in interstitial edema. 3.  Persistent low lung volumes with mild bibasilar atelectasis.   Original Report Authenticated By: [name], [name]     Brief H and P: This is a 78 year old gentleman who on Friday developed severe diarrhea. He has some mild abdominal pain. Later that day he started having several episodes of bright red blood per rectum. They went to [facility] and waited in the ER for 5-6 hours. During  the wait the patient's bleeding stopped. He had not been seen, he decided to go home. His diarrhea did continue both but with no further bleeding. Today he did not feel good, he slept all day, had a poor appetite. His daughter decided to take him to the ER. He has not had abdominal pain since Friday. He's never had a colonoscopy. He's not previously had blood in his stool. He reports no weight loss, fever, chills or previous constipation. He denies any lightheadedness or dizziness. History provided by his daughters who is at the bedside. The patient's alert and oriented but hard of hearing.    Hospital Course:  1. Infectious colitis: Had bloody diarrhea on admission this has resolved, Shiga toxin and bright red blood in stool: Status post colonoscopy on 12/10 which was consistent with infectious etiology,final report 12/12 states shiga  toxin positive but Shigella and Salmonella negative. Seen by ID, recommended supportive care only, Antibiotics stopped, no further diarrhea, leukocytosis.             This has resolved   2. Delirium: Underlying cognitive dysfunction vs early dementia, upon discussion with daughter for 1-2 months prior he was getting his morning and evening medications mixed up, forgetting things, especially recent memory, then with possible sundowning/delirium. Per nsg staff pt has had some intermittent confusion and agitation.  But beginning on pm of 12/14 was more confused and combative and so was started on risperidone. He initially appeared to improved on risperidone, but became drowsy and with increased tone in lower extremities, hence held PM risperdal 12/18, cognitively was clearer the next day(12/19) but worsened by 12/20 and also didn't sleep any last pm (12/20). Was then given low dose trazodone at 7pm 12/20 to help with sleep, 12/21 am lethargic with eyes rolling backwards and jerky movements witnessed by RN, Stopped trazodone.  Mentation improved from 12/21 pm, awake, quite appropriate but intermittently confused  MRI brain negative, EEG without epileptiform activity  Greatly appreciate Neurology consult per Dr.Reynolds  - CT scan of head ordered shows no acute intracranial findings  - B12, RPR normal  - UA negative   3. Acute on chronic renal failure: Resolved, was secondary to dehydration, hypotension. Continue holding ACE.   4. DM : back on insulin at lower dose than previously  5. Metabolic acidosis: Resolved, 2/2 diarrhea, dehydration and acute on chronic kidney disease. Resolved with hydration  6. Deconditioning: PT OT recommending skilled nursing, social work consulted to assist with placement   7. . HTN: Better blood pressure control on hydralazine and imdur. continue holding ACEI   8. Atrial Arrhythmia: pt with h/o arrhythmia and EKG 12/15 with undetermined rhythm, but the repeat EKG of  12/16 showed normal sinus- cardiac enzymes negative, 2-D echo with EF of 60% no wall motion abnormality   9.Generalized weakness: secondary to dehydration/diarrhea, and deconditioning. PT OT recommending skilled nursing.   10. Dysphagia: Had Swallow evaluation, following., Dysphagia 2 diet with thin liquids  11. Hypernatremia: from poor PO intake, improved,  encourage PO      Time spent on Discharge: 40min  Signed: Cathlene Gardella [facility]  01/15/2012, 11:12 AM

## 2012-01-15 NOTE — Progress Notes (Signed)
Physical Therapy Treatment Patient Details Name: Damon [name] MRN: [id number] DOB: 1933/03/23 Today's Date: 01/15/2012 Time: [id number] PT Time Calculation (min): 23 min  PT Assessment / Plan / Recommendation Comments on Treatment Session  Pt admitted with colitis and continues to progress with therapy. Pt able to increase activity tolerance with increased ability to stand statically. Motivated!    Follow Up Recommendations  SNF     Does the patient have the potential to tolerate intense rehabilitation     Barriers to Discharge        Equipment Recommendations  Rolling walker with 5" wheels    Recommendations for Other Services    Frequency Min 3X/week   Plan Frequency remains appropriate;Discharge plan needs to be updated    Precautions / Restrictions Precautions Precautions: Fall Restrictions Weight Bearing Restrictions: No   Pertinent Vitals/Pain None    Mobility  Bed Mobility Bed Mobility: Supine to Sit;Sit to Supine Supine to Sit: 3: Mod assist;HOB elevated Sit to Supine: 3: Mod assist;HOB elevated Details for Bed Mobility Assistance: Assist to bilateral LEs to move off EOB as well as trunk to facilitate anterior translation over BOS. Assist to also move bilateral LEs onto bed as well as slow descent of trunk to surface. Cues for sequence and safety. Transfers Transfers: Sit to Stand;Stand to Sit (2 trials.) Sit to Stand: 1: +2 Total assist;From bed Sit to Stand: Patient Percentage: 40% Stand to Sit: 1: +2 Total assist;To bed Stand to Sit: Patient Percentage: 40% Details for Transfer Assistance: Assist to translate trunk anterior over BOS while blocking bilateral knees to prevent buckling. Cues for tall posture and safe sequence. Assist to also extend trunk tall with facilitation at chest and ischials. Ambulation/Gait Ambulation/Gait Assistance: Not tested (comment) Stairs: No Wheelchair Mobility Wheelchair Mobility: No    Exercises     PT Diagnosis:    PT  Problem List:   PT Treatment Interventions:     PT Goals Acute Rehab PT Goals PT Goal Formulation: With patient Time For Goal Achievement: 01/28/12 Potential to Achieve Goals: Good Pt will go Supine/Side to Sit: with min assist PT Goal: Supine/Side to Sit - Progress: Updated due to goal met Pt will go Sit to Supine/Side: with min assist PT Goal: Sit to Supine/Side - Progress: Updated due to goal met PT Goal: Sit to Stand - Progress: Progressing toward goal PT Goal: Stand to Sit - Progress: Progressing toward goal  Visit Information  Last PT Received On: 01/15/12 Assistance Needed: +2 PT/OT Co-Evaluation/Treatment: Yes    Subjective Data  Subjective: "We can give it a try." Patient Stated Goal: none stated   Cognition  Overall Cognitive Status: Impaired Area of Impairment: Problem solving;Following commands;Attention Arousal/Alertness: Awake/alert Orientation Level: Disoriented to;Time Behavior During Session: [facility] for tasks performed Current Attention Level: Sustained Following Commands: Follows one step commands with increased time Problem Solving: Difficulty problem solving with cues to assist.  Cognition - Other Comments: Slow to process information.    Balance  Balance Balance Assessed: Yes Static Standing Balance Static Standing - Balance Support: Bilateral upper extremity supported Static Standing - Level of Assistance: 1: +2 Total assist;Patient percentage (comment) ((pt=50%)) Static Standing - Comment/# of Minutes: Pt stoo 2-3 minutes with assist to extend trunk and for balance. Blocked bilateral knees to prevent buckling.  End of Session PT - End of Session Equipment Utilized During Treatment: Gait belt Activity Tolerance: Patient tolerated treatment well Patient left: in bed;with call bell/phone within reach;with family/visitor present Nurse Communication: Mobility status  GP     Kendal Hymen M 01/15/2012, 12:40 PM  01/15/2012 Cyndia Bent, PT,  DPT [phone]

## 2012-01-15 NOTE — Clinical Social Work Note (Signed)
Clinical Social Worker spoke with daughter, Joya Martyr and Suanne Marker, admissions coordinator at [facility] regarding anticipated discharge today. Per Suanne Marker, she will need d/c summary today no later than 11:30am today. CSW will continue to follow and facilitate discharge when medically stable.  Leandro Reasoner MSW, [place]

## 2012-01-15 NOTE — Progress Notes (Signed)
Occupational Therapy Treatment Patient Details Name: Damon [name] MRN: [id number] DOB: 01-Feb-1933 Today's Date: 01/15/2012 Time: [id number] OT Time Calculation (min): 23 min  OT Assessment / Plan / Recommendation Comments on Treatment Session Pt progressing this session by static standing for 2-3 mintues with decreased buckling concentrating on daughter. Pt does buckle with static standing and high fall risk.     Follow Up Recommendations  SNF    Barriers to Discharge       Equipment Recommendations       Recommendations for Other Services    Frequency Min 2X/week   Plan Discharge plan needs to be updated    Precautions / Restrictions Precautions Precautions: Fall Restrictions Weight Bearing Restrictions: No   Pertinent Vitals/Pain Fatigued s/p standing x3 attempts    ADL  Grooming: Wash/dry face;Set up Where Assessed - Grooming: Unsupported sitting ADL Comments: Pt completed sit<>stand transfer x3 during session. Pt disoriented but pleasant willing to participate. Pt with Bil LE buckling. pt requires max v/c for static standing and upright Posture.    OT Diagnosis:    OT Problem List:   OT Treatment Interventions:     OT Goals Acute Rehab OT Goals OT Goal Formulation: With patient Time For Goal Achievement: 01/28/12 Potential to Achieve Goals: Fair ADL Goals Pt Will Perform Grooming: with supervision;Unsupported;Sitting, edge of bed;Other (comment) ADL Goal: Grooming - Progress: Progressing toward goals Pt Will Perform Upper Body Bathing: with supervision;Sitting, edge of bed;Unsupported Pt Will Perform Lower Body Bathing: with min assist;Sit to stand from bed;Supported Pt Will Perform Upper Body Dressing: with set-up;Unsupported;Sitting, bed Pt Will Perform Lower Body Dressing: with mod assist;with adaptive equipment;Sit to stand from bed Pt Will Transfer to Toilet: with min assist;3-in-1;with DME Pt Will Perform Toileting - Clothing Manipulation: with min  assist;Sitting on 3-in-1 or toilet;Standing Pt Will Perform Toileting - Hygiene: with min assist;Sit to stand from 3-in-1/toilet Miscellaneous OT Goals Miscellaneous OT Goal #1: Pt will transfer from supine to sit EOB with min assist in preparation for self care tasks. OT Goal: Miscellaneous Goal #1 - Progress: Progressing toward goals  Visit Information  Last OT Received On: 01/15/12 Assistance Needed: +2    Subjective Data      Prior Functioning       Cognition  Overall Cognitive Status: Impaired Area of Impairment: Problem solving;Following commands;Attention Arousal/Alertness: Awake/alert Orientation Level: Disoriented to;Time Behavior During Session: [facility] for tasks performed Current Attention Level: Sustained Following Commands: Follows one step commands with increased time Problem Solving: Difficulty problem solving with cues to assist.  Cognition - Other Comments: Slow to process information.    Mobility  Shoulder Instructions Bed Mobility Bed Mobility: Supine to Sit;Sit to Supine Supine to Sit: 3: Mod assist;HOB elevated Sit to Supine: 3: Mod assist;HOB elevated Details for Bed Mobility Assistance: Assist to bilateral LEs to move off EOB as well as trunk to facilitate anterior translation over BOS. Assist to also move bilateral LEs onto bed as well as slow descent of trunk to surface. Cues for sequence and safety. Transfers Sit to Stand: 1: +2 Total assist;From bed Sit to Stand: Patient Percentage: 40% Stand to Sit: 1: +2 Total assist;To bed Stand to Sit: Patient Percentage: 40% Details for Transfer Assistance: Assist to translate trunk anterior over BOS while blocking bilateral knees to prevent buckling. Cues for tall posture and safe sequence. Assist to also extend trunk tall with facilitation at chest and ischials.       Exercises      Balance Balance Balance  Assessed: Yes Static Standing Balance Static Standing - Balance Support: Bilateral upper extremity  supported Static Standing - Level of Assistance: 1: +2 Total assist;Patient percentage (comment) Static Standing - Comment/# of Minutes: pt stood 2-3 minutes with assist to extend trunk and for balance. Blocked bilateral knees to prevent buckling   End of Session OT - End of Session Equipment Utilized During Treatment: Gait belt Activity Tolerance: Patient limited by fatigue Patient left: with call bell/phone within reach;in bed;with family/visitor present Nurse Communication: Mobility status  GO     Veneda Melter 01/15/2012, 1:42 PM Pager: [phone]

## 2012-01-15 NOTE — Procedures (Signed)
EEG NUMBER:  REFERRING PHYSICIAN:  Domenic Polite, MD.  HISTORY:  A 78 year old male with episode of confusion and unresponsiveness.  MEDICATIONS:  Ativan, Imdur, Zofran, Apresoline, Vicodin, insulin.  CONDITIONS OF RECORDING:  This is a 16-channel EEG carried out with the patient in the awake and drowsy state.  DESCRIPTION:  The waking background activity consists of a low-voltage, symmetrical, fairly well-organized 7 Hz beta activity seen from the parieto-occipital and posterotemporal regions.  This is the maximum frequency seen.  Often the posterior background rhythm is even slower and can be seen to be in the delta range.  Theta and alpha rhythms are seen from the central and temporal regions with theta rhythms being most predominant.  Low voltage, fast activity, poorly organized was seen anteriorly at times, superimposed on more posterior rhythms.  There is further slowing with drowse. Stage II sleep is not obtained. Hyperventilation was not performed.  Intermittent photic stimulation was not performed.  IMPRESSION:  This is an abnormal EEG secondary to posterior background slowing.  This finding can be seen with a diffuse gray matter disturbance that is etiologically nonspecific, but may include a dementia among other possibilities.  No epileptiform activity was noted.          ______________________________ Alexis Goodell, MD    [id number] D:  01/14/2012 16:47:51  T:  01/15/2012 03:11:27  Job #:  [id number]

## 2012-01-28 ENCOUNTER — Telehealth: Payer: Self-pay | Admitting: Family Medicine

## 2012-01-28 NOTE — Telephone Encounter (Signed)
Yes I would like to see him for follow up as well.

## 2012-01-28 NOTE — Telephone Encounter (Signed)
Appt scheduled for 1/14.

## 2012-01-28 NOTE — Telephone Encounter (Signed)
Caller: Angela/Patient; Phone: [phone]; Reason for Call:   Daughter/ Levada Dy calling stating pt  recently hospitalized for bacterial infection and will be discharged home 01/30/2012 from [facility].  Daughter wants to know if he needs to see Dr.  Deborra Medina for follow up or the Diabetic physician he was referred to.  He has appt with the Endocrinologist on 02/25/2012.  Daughter can be reached at [phone] and leave msg if needed. OFFICE FOLLOWP

## 2012-02-04 ENCOUNTER — Ambulatory Visit (INDEPENDENT_AMBULATORY_CARE_PROVIDER_SITE_OTHER): Payer: Medicare Other | Admitting: Family Medicine

## 2012-02-04 ENCOUNTER — Encounter: Payer: Self-pay | Admitting: Family Medicine

## 2012-02-04 VITALS — BP 130/64 | HR 72 | Temp 97.9°F | Wt 207.0 lb

## 2012-02-04 DIAGNOSIS — F4489 Other dissociative and conversion disorders: Secondary | ICD-10-CM

## 2012-02-04 DIAGNOSIS — A039 Shigellosis, unspecified: Secondary | ICD-10-CM

## 2012-02-04 DIAGNOSIS — N289 Disorder of kidney and ureter, unspecified: Secondary | ICD-10-CM

## 2012-02-04 DIAGNOSIS — R404 Transient alteration of awareness: Secondary | ICD-10-CM

## 2012-02-04 DIAGNOSIS — E1129 Type 2 diabetes mellitus with other diabetic kidney complication: Secondary | ICD-10-CM

## 2012-02-04 DIAGNOSIS — R410 Disorientation, unspecified: Secondary | ICD-10-CM

## 2012-02-04 DIAGNOSIS — E1121 Type 2 diabetes mellitus with diabetic nephropathy: Secondary | ICD-10-CM

## 2012-02-04 DIAGNOSIS — N058 Unspecified nephritic syndrome with other morphologic changes: Secondary | ICD-10-CM

## 2012-02-04 DIAGNOSIS — N179 Acute kidney failure, unspecified: Secondary | ICD-10-CM

## 2012-02-04 DIAGNOSIS — N189 Chronic kidney disease, unspecified: Secondary | ICD-10-CM

## 2012-02-04 MED ORDER — LISINOPRIL 20 MG PO TABS: 20.0000 mg | ORAL_TABLET | Freq: Every day | ORAL | Status: DC

## 2012-02-04 NOTE — Patient Instructions (Addendum)
Please STOP Imdur. Please restart Lisinopril 20 mg daily.  Please come see me in 2 weeks to recheck your blood pressure.  Please keep checking your blood sugars twice daily.

## 2012-02-04 NOTE — Progress Notes (Signed)
79 year old pleasant man here with his daughter for follow up [facility].  Notes from [facility] reviewed.  Admitted to [facility] 12/8 - 01/15/2012 for bloody diarrhea.  Initially went to [facility] and waited in the ER for 5-6 hours. While he was waiting, rectal bleeding stopped, so he decided to go home.   Next day he was lethargic so his daughter decided to take him to the ER.    Colonoscopy on 12/10 was consistent with infectious etiology,final report 12/12 states shiga toxin positive but Shigella and Salmonella negative.   Seen by ID, recommended supportive care only, Antibiotics stopped, no further diarrhea. Lab Results  Component Value Date   WBC 7.2 01/15/2012   HGB 12.1* 01/15/2012   HCT 37.7* 01/15/2012   MCV 86.9 01/15/2012   PLT 144* 01/15/2012      Delirium: Acute delirium beginning on the night of 12/14.  Was more confused and combative and so was started on risperidone. He initially appeared to improved on risperidone, but became drowsy and with increased tone in lower extremities, hence held PM risperdal 12/18, cognitively was clearer the next day(12/19) but worsened by 12/20 and also didn't sleep any last pm (12/20). Was then given low dose trazodone at 7pm 12/20 to help with sleep, 12/21 am lethargic with eyes rolling backwards and jerky movements witnessed by RN, Stopped trazodone.  Mentation improved from 12/21 pm, awake, quite appropriate but intermittently confused   MRI brain negative, EEG without epileptiform activity   - CT scan of head ordered shows no acute intracranial findings  - B12, RPR normal  - UA negative    . Acute on chronic renal failure: Resolved, was secondary to dehydration, hypotension. ACEI held and Imdur started. Lab Results  Component Value Date   CREATININE 1.53* 01/15/2012    Generalized weakness so was sent to [facility] for PT/OT/rehab.  He does feel stronger.  DM- due to hypoglycemia in the  hospital, insulin decreased to 16 units int he am and 24 units qhs.  CBGs fasting have been running 90s-low 100s.   Denies any episodes of hypoglycemia.  Has follow up scheduled with endocrinologist 02/25/2012.   ROS: See HPI  Physical exam:  BP 130/64  Pulse 72  Temp 97.9 F (36.6 C) (Oral)  Wt 207 lb (93.895 kg) Wt Readings from Last 3 Encounters:  02/04/12 207 lb (93.895 kg)  01/13/12 219 lb 5.7 oz (99.5 kg)  01/13/12 219 lb 5.7 oz (99.5 kg)     Constitutional: He is oriented to person, place, and time. He appears well-developed and well-nourished.  obese  HENT:  Head: Normocephalic.  Nose: Nose normal.  Mouth/Throat: Oropharynx is clear and moist.  Eyes: Conjunctivae are normal. Pupils are equal, round, and reactive to light.  Neck: Normal range of motion. Neck supple. No JVD present.  Cardiovascular: Normal rate, regular rhythm, S1 normal, S2 normal, normal heart sounds and intact distal pulses. Exam reveals no gallop and no friction rub.  No murmur heard.  Pulmonary/Chest: Effort normal and breath sounds normal. No respiratory distress. He has no wheezes. He has no rales. He exhibits no tenderness.  Abdominal: Soft. Bowel sounds are normal. He exhibits no distension. There is no tenderness.  Musculoskeletal: Normal range of motion. He exhibits no edema and no tenderness.  Lymphadenopathy:  He has no cervical adenopathy.  Neurological: He is alert and oriented to person, place, and time. Coordination normal.  Skin: Skin is warm and dry. No rash noted.  No erythema.  Psychiatric: He has a normal mood and affect. His behavior is normal. Judgment and thought content normal.  Assessment and Plan: 1. Diabetes mellitus with kidney disease  CBGs stable.  Continue checking CBGs twice daily- may need to titrate up insulin as his diet returns to normal. Keep follow up appt with endocrinology. The patient indicates understanding of these issues and agrees with the plan.    2.  HTN- D/c Imdur and restart lisinopril 20 mg daily for renal protection. Follow up in 2 weeks.   3. Shigella gastroenteritis  Resolved.   4. Acute-on-chronic kidney injury  Pre renal- recheck cr today. Comprehensive metabolic panel

## 2012-02-05 ENCOUNTER — Ambulatory Visit: Payer: Medicare Other | Admitting: Family Medicine

## 2012-02-05 LAB — COMPREHENSIVE METABOLIC PANEL
ALT: 15 U/L (ref 0–53)
AST: 18 U/L (ref 0–37)
Albumin: 3.5 g/dL (ref 3.5–5.2)
Alkaline Phosphatase: 101 U/L (ref 39–117)
BUN: 25 mg/dL — ABNORMAL HIGH (ref 6–23)
CO2: 26 mEq/L (ref 19–32)
Calcium: 9.8 mg/dL (ref 8.4–10.5)
Chloride: 103 mEq/L (ref 96–112)
Creatinine, Ser: 1.4 mg/dL (ref 0.4–1.5)
GFR: 52.7 mL/min — ABNORMAL LOW (ref >60.00)
Glucose, Bld: 265 mg/dL — ABNORMAL HIGH (ref 70–99)
Potassium: 4.9 mEq/L (ref 3.5–5.1)
Sodium: 135 mEq/L (ref 135–145)
Total Bilirubin: 0.5 mg/dL (ref 0.3–1.2)
Total Protein: 6.6 g/dL (ref 6.0–8.3)

## 2012-02-18 ENCOUNTER — Ambulatory Visit: Payer: Medicare Other | Admitting: Family Medicine

## 2012-03-06 LAB — STOOL CULTURE: Culture: POSITIVE

## 2012-03-13 ENCOUNTER — Telehealth: Payer: Self-pay

## 2012-03-13 NOTE — Telephone Encounter (Signed)
I called [name] back and pt has changed mail order pharmacies to [facility]. Pt seen 02/04/12 and pt was to return in 2 weeks to see Dr Deborra Medina; pt cancelled appt. Lynn rescheduled for 03/20/12 at 8 am. [name] needed early morning or late afternoon appt). [name] said pt had enough med to last until appt.

## 2012-03-13 NOTE — Telephone Encounter (Signed)
Damon [name] pts daughter left v/m requesting a change in pts mail order pharmacy. Left v/m for Damon [name] to call back.

## 2012-03-13 NOTE — Telephone Encounter (Signed)
1 Confidential Office Message [address] [place], [place] [address] p. [phone] f. [phone] To: [facility] (Daytime Triage) Fax: [phone] From: [facility] Date/ Time: 03/13/2012 15:50:02 Taken By: Thereasa Parkin, RN Caller: [place]: N/A Patient: Damon, [name] DOB: May 20, 1933 Phone: [phone] Message: Insurance changed 01/23/12; Called to request medication refills be sent to [facility]. He is NOT out of medications. Damon [name] became frustrated with answering machine when she was transferred by office staff to leave a message for [name]. Since she left an incomplete information, RN sent this note for follow up. Please call Damon [name] back.

## 2012-03-20 ENCOUNTER — Telehealth: Payer: Self-pay | Admitting: Family Medicine

## 2012-03-20 ENCOUNTER — Encounter: Payer: Self-pay | Admitting: Family Medicine

## 2012-03-20 ENCOUNTER — Ambulatory Visit (INDEPENDENT_AMBULATORY_CARE_PROVIDER_SITE_OTHER): Payer: Medicare Other | Admitting: Family Medicine

## 2012-03-20 VITALS — BP 110/54 | HR 76 | Temp 98.3°F | Wt 201.0 lb

## 2012-03-20 DIAGNOSIS — I10 Essential (primary) hypertension: Secondary | ICD-10-CM

## 2012-03-20 MED ORDER — INSULIN NPH ISOPHANE & REGULAR (70-30) 100 UNIT/ML ~~LOC~~ SUSP: SUBCUTANEOUS | Status: DC

## 2012-03-20 MED ORDER — LISINOPRIL 20 MG PO TABS: 20.0000 mg | ORAL_TABLET | Freq: Every day | ORAL | Status: DC

## 2012-03-20 MED ORDER — FUROSEMIDE 40 MG PO TABS: 40.0000 mg | ORAL_TABLET | ORAL | Status: DC

## 2012-03-20 MED ORDER — "INSULIN SYRINGE-NEEDLE U-100 30G X 1/2"" 0.5 ML MISC": Status: DC

## 2012-03-20 MED ORDER — GEMFIBROZIL 600 MG PO TABS: 600.0000 mg | ORAL_TABLET | Freq: Two times a day (BID) | ORAL | Status: DC

## 2012-03-20 NOTE — Telephone Encounter (Signed)
Daughter calls concerned that medications father was no longer on had been e-prescribed to a his new mail order pharmaceutical company.  RN reviewed in Epic the medications that had been ordered.  Caller agreed that these were current meds and none had been ordered that he was not currently taking.  The take home med list had confused her since it had meds listed her father was no longer taking.

## 2012-03-20 NOTE — Telephone Encounter (Signed)
Please call pt's daughter to update med list.

## 2012-03-20 NOTE — Patient Instructions (Signed)
Your blood pressure looks good. Please keep your appointment with your diabetes doctor.

## 2012-03-20 NOTE — Progress Notes (Signed)
79 year old pleasant man here with his daughter for follow up .  HTN- last month, during his appt for hospital/rehab follow up, we d/c'd his imdur and restarted lisinopril for renal protection.  DM- Followed by endocrinology.  Last office visit earlier this month.  CBGs fasting have been running 90s-low 100s.  Has had a couple of high readings in the morning- was 180 yesterday morning. Taking 16 units of humalin in am and 24 units at night.  Denies any episodes of hypoglycemia.   Has follow up with endocrinology in April 2014.  Has lost weight but appetite is good. Wt Readings from Last 3 Encounters:  03/20/12 201 lb (91.173 kg)  02/04/12 207 lb (93.895 kg)  01/13/12 219 lb 5.7 oz (99.5 kg)     Patient Active Problem List  Diagnosis  . Diabetes mellitus with kidney disease  . DIABETIC PERIPHERAL NEUROPATHY  . HYPERTRIGLYCERIDEMIA  . OBESITY  . HYPERTENSION   Past Medical History  Diagnosis Date  . Bradycardia   . Diabetes mellitus   . Hypertension   . Chronic kidney disease     BAseline creatinine 1.4 to 1.5  . Arrhythmia     Baseline bradycardia  . Hard of hearing    Past Surgical History  Procedure Laterality Date  . Tonsillectomy    . Colonoscopy  01/01/2012    Procedure: COLONOSCOPY;  Surgeon: [name], MD;  Location: [facility] ENDOSCOPY;  Service: Endoscopy;  Laterality: N/A;   History  Substance Use Topics  . Smoking status: Never Smoker   . Smokeless tobacco: Former Systems developer    Types: Chew     Comment: chewing more than 40 years/chew 1PPD  . Alcohol Use: No   Family History  Problem Relation Age of Onset  . Diabetes type II Mother   . Hypertension Mother   . Diabetes type II Father   . Hypertension Father    Allergies  Allergen Reactions  . Penicillins Other (See Comments)    Reaction as a child   Current Outpatient Prescriptions on File Prior to Visit  Medication Sig Dispense Refill  . acetaminophen (TYLENOL) 325 MG tablet Take 2 tablets (650  mg total) by mouth every 6 (six) hours as needed (or Fever >/= 101).      Marland Kitchen aspirin 81 MG tablet Take 81 mg by mouth daily.        . cinacalcet (SENSIPAR) 30 MG tablet Take 30 mg by mouth 2 (two) times a week. Takes on Wed and Sun      . furosemide (LASIX) 40 MG tablet Take 40 mg by mouth every other day.       Marland Kitchen gemfibrozil (LOPID) 600 MG tablet Take 600 mg by mouth 2 (two) times daily before a meal.      . hydrALAZINE (APRESOLINE) 50 MG tablet Take 1 tablet (50 mg total) by mouth every 8 (eight) hours.  60 tablet  0  . insulin NPH-insulin regular (HUMULIN 70/30) (70-30) 100 UNIT/ML injection Inject 16 Units into the skin 2 (two) times daily with a meal. Takes 50 units in the am & 40 units in the pm  10 mL  0  . lisinopril (PRINIVIL,ZESTRIL) 20 MG tablet Take 1 tablet (20 mg total) by mouth daily.  90 tablet  3   No current facility-administered medications on file prior to visit.   The PMH, PSH, Social History, Family History, Medications, and allergies have been reviewed in [facility], and have been updated if relevant.  ROS: See  HPI Denies any dizziness when he stands up. No SOB or CP  Physical exam:  BP 110/54  Pulse 76  Temp(Src) 98.3 F (36.8 C)  Wt 201 lb (91.173 kg)  BMI 33.45 kg/m2  SpO2 97%  Wt Readings from Last 3 Encounters:  03/20/12 201 lb (91.173 kg)  02/04/12 207 lb (93.895 kg)  01/13/12 219 lb 5.7 oz (99.5 kg)      Constitutional: He is oriented to person, place, and time. He appears well-developed and well-nourished.  obese  HENT:  Head: Normocephalic.  Nose: Nose normal.  Mouth/Throat: Oropharynx is clear and moist.  Eyes: Conjunctivae are normal. Pupils are equal, round, and reactive to light.  Neck: Normal range of motion. Neck supple. No JVD present.  Cardiovascular: Normal rate, regular rhythm, S1 normal, S2 normal, normal heart sounds and intact distal pulses. Exam reveals no gallop and no friction rub.  No murmur heard.  Pulmonary/Chest: Effort normal  and breath sounds normal. No respiratory distress. He has no wheezes. He has no rales. He exhibits no tenderness.  Abdominal: Soft. Bowel sounds are normal. He exhibits no distension. There is no tenderness.  Musculoskeletal: Normal range of motion. He exhibits no edema and no tenderness.  Lymphadenopathy:  He has no cervical adenopathy.  Neurological: He is alert and oriented to person, place, and time. Coordination normal.  Skin: Skin is warm and dry. No rash noted. No erythema.  Psychiatric: He has a normal mood and affect. His behavior is normal. Judgment and thought content normal.  Assessment and Plan:  1. HYPERTENSION Well controlled on lisinopril. No sx of orthostasis. Continue current does.

## 2012-03-21 NOTE — Telephone Encounter (Signed)
Med list updated.  I had gone over the meds with daughter in the office yesterday, no changes were mentioned at that time.

## 2012-03-27 ENCOUNTER — Other Ambulatory Visit: Payer: Self-pay | Admitting: *Deleted

## 2012-03-27 MED ORDER — "INSULIN SYRINGE-NEEDLE U-100 30G X 1/2"" 0.5 ML MISC": Status: DC

## 2012-03-27 NOTE — Telephone Encounter (Signed)
Faxed request from [facility], form faxed back to them.

## 2012-06-22 LAB — HM DIABETES EYE EXAM

## 2012-08-22 LAB — HM DIABETES FOOT EXAM

## 2012-08-28 ENCOUNTER — Telehealth: Payer: Self-pay

## 2012-08-28 NOTE — Telephone Encounter (Signed)
Damon [name] pts daughter request status of [employer] request for supplies for test strips and insulin syringes and lancets. Fax # for [employer] [facility] [phone] and include pt acct  # [id number]. Phone # [phone].Damon [name] request cb.Please advise.

## 2012-08-29 NOTE — Telephone Encounter (Signed)
Form is on your desk, Dr. Hulen Shouts patient.

## 2012-08-29 NOTE — Telephone Encounter (Signed)
Okay to await Dr. Hulen Shouts return.

## 2012-09-01 ENCOUNTER — Encounter: Payer: Self-pay | Admitting: Family Medicine

## 2012-09-01 NOTE — Telephone Encounter (Signed)
Damon [name] said getting supplies thru [employer] does not cost pt any out of pocket; over weekend purchased test strips and cost to pt was $70.00.Pt is going to run out of syringes and lancets and does not want to pay out of pocket and cannot wait on Dr Hulen Shouts return. Damon [name] request cb.

## 2012-09-01 NOTE — Telephone Encounter (Signed)
Spoke with [employer].  She states pt needs this form faxed in today if possible.  Form is in your in basket.

## 2012-09-01 NOTE — Telephone Encounter (Signed)
Form faxed

## 2012-09-01 NOTE — Telephone Encounter (Signed)
Filled out and placed in my out box. 

## 2012-10-01 ENCOUNTER — Ambulatory Visit: Payer: Self-pay | Admitting: Surgery

## 2012-10-01 LAB — PROTIME-INR
INR: 0.9
Prothrombin Time: 12.7 secs (ref 11.5–14.7)

## 2012-10-01 LAB — HEPATIC FUNCTION PANEL A (ARMC)
Albumin: 3.5 g/dL
Alkaline Phosphatase: 147 U/L — ABNORMAL HIGH
Bilirubin, Direct: 0.1 mg/dL
Bilirubin,Total: 0.5 mg/dL
SGOT(AST): 19 U/L
SGPT (ALT): 20 U/L
Total Protein: 7 g/dL

## 2012-10-01 LAB — BASIC METABOLIC PANEL WITH GFR
Anion Gap: 7
BUN: 30 mg/dL — ABNORMAL HIGH
Calcium, Total: 10.3 mg/dL — ABNORMAL HIGH
Chloride: 105 mmol/L
Co2: 25 mmol/L
Creatinine: 1.79 mg/dL — ABNORMAL HIGH
EGFR (African American): 41 — ABNORMAL LOW
EGFR (Non-African Amer.): 36 — ABNORMAL LOW
Glucose: 305 mg/dL — ABNORMAL HIGH
Osmolality: 291
Potassium: 4.5 mmol/L
Sodium: 137 mmol/L

## 2012-10-01 LAB — CBC WITH DIFFERENTIAL/PLATELET
Basophil #: 0 x10 3/mm 3
Basophil %: 0.7 %
Eosinophil #: 0.1 x10 3/mm 3
Eosinophil %: 1.4 %
HCT: 48.6 %
HGB: 16.4 g/dL
Lymphocyte %: 31.4 %
Lymphs Abs: 2 x10 3/mm 3
MCH: 29.6 pg
MCHC: 33.8 g/dL
MCV: 87 fL
Monocyte #: 0.5 "x10 3/mm "
Monocyte %: 7.8 %
Neutrophil #: 3.8 x10 3/mm 3
Neutrophil %: 58.7 %
Platelet: 171 x10 3/mm 3
RBC: 5.56 x10 6/mm 3
RDW: 14.8 % — ABNORMAL HIGH
WBC: 6.4 x10 3/mm 3

## 2012-10-01 LAB — APTT: Activated PTT: 37.5 secs — ABNORMAL HIGH (ref 23.6–35.9)

## 2012-10-10 ENCOUNTER — Ambulatory Visit: Payer: Self-pay | Admitting: Surgery

## 2012-10-11 LAB — ALBUMIN: Albumin: 3.6 g/dL (ref 3.4–5.0)

## 2012-10-11 LAB — CALCIUM: Calcium, Total: 9.9 mg/dL (ref 8.5–10.1)

## 2012-10-13 LAB — PATHOLOGY REPORT

## 2012-11-10 ENCOUNTER — Telehealth: Payer: Self-pay

## 2012-11-10 NOTE — Telephone Encounter (Signed)
Levada Dy pts daughter request refills for mail order; last seen 03/20/12. Scheduled appt with Dr Darnell Level 11/11/12 at 2:45 pm.

## 2012-11-11 ENCOUNTER — Encounter: Payer: Self-pay | Admitting: Family Medicine

## 2012-11-11 ENCOUNTER — Ambulatory Visit (INDEPENDENT_AMBULATORY_CARE_PROVIDER_SITE_OTHER): Payer: Medicare Other | Admitting: Family Medicine

## 2012-11-11 VITALS — BP 118/64 | HR 68 | Temp 98.1°F | Wt 213.2 lb

## 2012-11-11 DIAGNOSIS — E1121 Type 2 diabetes mellitus with diabetic nephropathy: Secondary | ICD-10-CM

## 2012-11-11 DIAGNOSIS — I10 Essential (primary) hypertension: Secondary | ICD-10-CM

## 2012-11-11 DIAGNOSIS — E781 Pure hyperglyceridemia: Secondary | ICD-10-CM

## 2012-11-11 DIAGNOSIS — N058 Unspecified nephritic syndrome with other morphologic changes: Secondary | ICD-10-CM

## 2012-11-11 DIAGNOSIS — E1129 Type 2 diabetes mellitus with other diabetic kidney complication: Secondary | ICD-10-CM

## 2012-11-11 MED ORDER — INSULIN NPH ISOPHANE & REGULAR (70-30) 100 UNIT/ML ~~LOC~~ SUSP: SUBCUTANEOUS | Status: DC

## 2012-11-11 MED ORDER — LISINOPRIL 20 MG PO TABS: 20.0000 mg | ORAL_TABLET | Freq: Every day | ORAL | Status: DC

## 2012-11-11 MED ORDER — GEMFIBROZIL 600 MG PO TABS: 600.0000 mg | ORAL_TABLET | Freq: Two times a day (BID) | ORAL | Status: DC

## 2012-11-11 MED ORDER — FUROSEMIDE 40 MG PO TABS: 40.0000 mg | ORAL_TABLET | ORAL | Status: DC

## 2012-11-11 NOTE — Patient Instructions (Signed)
You are doing well. I've refilled medications. Call us with questions. Return to see [place] in about 6 months for follow up

## 2012-11-11 NOTE — Assessment & Plan Note (Signed)
Chronic, stable. Continue gemfibrozil. Will need FLP next visit.

## 2012-11-11 NOTE — Progress Notes (Signed)
  Subjective:    Patient ID: Damon [name], male    DOB: 1933/09/05, 79 y.o.   MRN: [id number]  HPI CC: med refill  Dr. Hulen Shouts patient presents today for med refill visit as she's on maternity leave.  DM - sees [name] regularly.  Overall followed by endo.  Checks sugars once daily.  This morning 111.  No low sugars recently.  No paresthesias.  Unsure latest A1c but thinks well controlled.  CKD - sees Dr. Candiss Norse at [facility].  On calcium and calcitriol.  Recently referred to urology.  Stage III on last check last month, with Cr 1.5.  Recently had parathyroid surgery.  HTN - No HA, vision changes, CP/tightness, SOB, leg swelling. Compliant with meds - lasix 40mg  qod and lisinopril 20mg  daily.  HLD - no myalgias with gemfibrozil 600mg  bid daily.  Recently had blood work done 3 wks ago.  Pending f/u lab visit and endo visit.   Past Medical History  Diagnosis Date  . Bradycardia   . Diabetes mellitus   . Hypertension   . Chronic kidney disease     BAseline creatinine 1.4 to 1.5  . Arrhythmia     Baseline bradycardia  . Hard of hearing      Review of Systems Per HPI    Objective:   Physical Exam  Nursing note and vitals reviewed. Constitutional: He appears well-developed and well-nourished. No distress.  Central obesity  HENT:  Mouth/Throat: Oropharynx is clear and moist. No oropharyngeal exudate.  Eyes: Conjunctivae and EOM are normal. Pupils are equal, round, and reactive to light. No scleral icterus.  Neck: Carotid bruit is not present.  Cardiovascular: Normal rate, regular rhythm, normal heart sounds and intact distal pulses.   No murmur heard. Pulmonary/Chest: Effort normal and breath sounds normal. No respiratory distress. He has no wheezes. He has no rales.  Musculoskeletal: He exhibits no edema.  Lymphadenopathy:    He has no cervical adenopathy.       Assessment & Plan:

## 2012-11-11 NOTE — Assessment & Plan Note (Signed)
Chronic, seems stable. I have asked them to send [place] copy of latest blood work from Dr. Joycie Peek office.

## 2012-11-11 NOTE — Assessment & Plan Note (Signed)
Chronic, stable. Continue meds. Tolerating well.  Latest Cr 1.5 by renal note last month.

## 2012-11-19 DIAGNOSIS — N3941 Urge incontinence: Secondary | ICD-10-CM | POA: Insufficient documentation

## 2012-11-19 DIAGNOSIS — N401 Enlarged prostate with lower urinary tract symptoms: Secondary | ICD-10-CM | POA: Insufficient documentation

## 2012-11-19 DIAGNOSIS — R339 Retention of urine, unspecified: Secondary | ICD-10-CM | POA: Insufficient documentation

## 2013-02-26 ENCOUNTER — Other Ambulatory Visit: Payer: Self-pay

## 2013-02-26 MED ORDER — FUROSEMIDE 40 MG PO TABS: 40.0000 mg | ORAL_TABLET | ORAL | Status: DC

## 2013-02-26 MED ORDER — GEMFIBROZIL 600 MG PO TABS: 600.0000 mg | ORAL_TABLET | Freq: Two times a day (BID) | ORAL | Status: DC

## 2013-02-26 MED ORDER — LISINOPRIL 20 MG PO TABS: 20.0000 mg | ORAL_TABLET | Freq: Every day | ORAL | Status: DC

## 2013-02-26 NOTE — Telephone Encounter (Signed)
Levada Dy pts daughter said pts ins changed and pharmacy changed to [facility]; angela has notified [facility] no longer pts pharmacy. Advised refills sent to [facility].

## 2013-05-01 ENCOUNTER — Ambulatory Visit (INDEPENDENT_AMBULATORY_CARE_PROVIDER_SITE_OTHER): Payer: Commercial Managed Care - HMO | Admitting: Family Medicine

## 2013-05-01 ENCOUNTER — Encounter: Payer: Self-pay | Admitting: Family Medicine

## 2013-05-01 ENCOUNTER — Encounter: Payer: Self-pay | Admitting: Gastroenterology

## 2013-05-01 VITALS — BP 142/68 | HR 50 | Temp 98.1°F | Wt 226.2 lb

## 2013-05-01 DIAGNOSIS — R197 Diarrhea, unspecified: Secondary | ICD-10-CM | POA: Insufficient documentation

## 2013-05-01 DIAGNOSIS — R195 Other fecal abnormalities: Secondary | ICD-10-CM

## 2013-05-01 LAB — CBC WITH DIFFERENTIAL/PLATELET
Basophils Absolute: 0 10*3/uL (ref 0.0–0.1)
Basophils Relative: 0 % (ref 0–1)
Eosinophils Absolute: 0.1 10*3/uL (ref 0.0–0.7)
Eosinophils Relative: 2 % (ref 0–5)
HCT: 45.6 % (ref 39.0–52.0)
Hemoglobin: 15.3 g/dL (ref 13.0–17.0)
Lymphocytes Relative: 39 % (ref 12–46)
Lymphs Abs: 2.5 10*3/uL (ref 0.7–4.0)
MCH: 29.6 pg (ref 26.0–34.0)
MCHC: 33.6 g/dL (ref 30.0–36.0)
MCV: 88.2 fL (ref 78.0–100.0)
Monocytes Absolute: 0.7 10*3/uL (ref 0.1–1.0)
Monocytes Relative: 11 % (ref 3–12)
Neutro Abs: 3 10*3/uL (ref 1.7–7.7)
Neutrophils Relative %: 48 % (ref 43–77)
Platelets: 175 10*3/uL (ref 150–400)
RBC: 5.17 MIL/uL (ref 4.22–5.81)
RDW: 14.7 % (ref 11.5–15.5)
WBC: 6.3 10*3/uL (ref 4.0–10.5)

## 2013-05-01 LAB — BASIC METABOLIC PANEL
BUN: 29 mg/dL — ABNORMAL HIGH (ref 6–23)
CO2: 20 mEq/L (ref 19–32)
Calcium: 8.7 mg/dL (ref 8.4–10.5)
Chloride: 104 mEq/L (ref 96–112)
Creat: 1.68 mg/dL — ABNORMAL HIGH (ref 0.50–1.35)
Glucose, Bld: 234 mg/dL — ABNORMAL HIGH (ref 70–99)
Potassium: 4.5 mEq/L (ref 3.5–5.3)
Sodium: 136 mEq/L (ref 135–145)

## 2013-05-01 NOTE — Patient Instructions (Signed)
Good to see you. Please stop by the lab.  Please stop by to see Rosaria Ferries on your way out to set up your referral to see Dr. Oletta Lamas.

## 2013-05-01 NOTE — Assessment & Plan Note (Signed)
With heme pos stool and h/o infectious colitis. Blood may be due to irritation but given history, will refer back to GI. Check CBC, electrolytes and c diff today. The patient indicates understanding of these issues and agrees with the plan.

## 2013-05-01 NOTE — Progress Notes (Signed)
Pre visit review using our clinic review tool, if applicable. No additional management support is needed unless otherwise documented below in the visit note. 

## 2013-05-01 NOTE — Progress Notes (Signed)
Subjective:   Patient ID: Damon [name], male    DOB: 03-30-1933, 80 y.o.   MRN: [id number]  Damon [name] is a pleasant 80 y.o. year old male who presents to clinic today with Diarrhea  on 05/01/2013  HPI: 80 yo here for diarrhea  X 1 week. Water stools- 5-6 times per day.  No blood in stool, no dark stools.  No fevers. No nausea or vomiting.  No abdominal pain.  No one in family has similar symptoms.  Imodium not helping.  PMH significant for infectious colitis (Dr. Oletta Lamas, colonoscopy in 12/2011).  No recent hospitalizations or antibiotic use. Patient Active Problem List   Diagnosis Date Noted  . Diarrhea 05/01/2013  . 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 DIABETIC PERIPHERAL NEUROPATHY 12/04/2005  . HYPERTRIGLYCERIDEMIA 12/04/2005  . OBESITY 12/04/2005  . HYPERTENSION 12/04/2005   Past Medical History  Diagnosis Date  . Bradycardia   . Diabetes mellitus   . Hypertension   . Chronic kidney disease     BAseline creatinine 1.4 to 1.5  . Arrhythmia     Baseline bradycardia  . Hard of hearing    Past Surgical History  Procedure Laterality Date  . Tonsillectomy    . Colonoscopy  01/01/2012    Procedure: COLONOSCOPY;  Surgeon: [name], MD;  Location: [facility] ENDOSCOPY;  Service: Endoscopy;  Laterality: N/A;   History  Substance Use Topics  . Smoking status: Never Smoker   . Smokeless tobacco: Former Systems developer    Types: Chew     Comment: chewing more than 40 years/chew 1PPD  . Alcohol Use: No   Family History  Problem Relation Age of Onset  . Diabetes type II Mother   . Hypertension Mother   . Diabetes type II Father   . Hypertension Father    Allergies  Allergen Reactions  . Penicillins Other (See Comments)    Reaction as a child   Current Outpatient Prescriptions on File Prior to Visit  Medication Sig Dispense Refill  . acetaminophen (TYLENOL) 325 MG tablet Take 2 tablets (650 mg total) by mouth every 6 (six) hours  as needed (or Fever >/= 101).      Marland Kitchen aspirin 81 MG tablet Take 81 mg by mouth daily.        . calcitRIOL (ROCALTROL) 0.5 MCG capsule Take 1 capsule by mouth daily.      . calcium acetate (PHOSLO) 667 MG capsule Take 1 capsule by mouth daily.      . furosemide (LASIX) 40 MG tablet Take 1 tablet (40 mg total) by mouth every other day.  45 tablet  2  . gemfibrozil (LOPID) 600 MG tablet Take 1 tablet (600 mg total) by mouth 2 (two) times daily before a meal.  180 tablet  2  . insulin NPH-regular (HUMULIN 70/30) (70-30) 100 UNIT/ML injection Inject 35 units in the morning and 35 units at night. Syringes.  QS 3 months  30 mL  3  . Insulin Syringe-Needle U-100 (B-D INS SYRINGE 0.5CC/30GX1/2") 30G X 1/2" 0.5 ML MISC Use to inject insulin twice a day.  200 each  3  . lisinopril (PRINIVIL,ZESTRIL) 20 MG tablet Take 1 tablet (20 mg total) by mouth daily.  90 tablet  2  . tamsulosin (FLOMAX) 0.4 MG CAPS capsule Take 0.4 mg by mouth daily.       No current facility-administered medications on file prior to visit.   The PMH, PSH, Social History, Family History,  Medications, and allergies have been reviewed in [facility], and have been updated if relevant.  Review of Systems See HPI    Objective:    BP 142/68  Pulse 50  Temp(Src) 98.1 F (36.7 C) (Oral)  Wt 226 lb 4 oz (102.626 kg)  SpO2 97%   Physical Exam  Gen: alert, NAD Abd:  Soft, NT Rectal: Normal tone, Stool soft in vault- no melena Hemocult positive      Assessment & Plan:   Diarrhea - Plan: C difficile Toxins A+B W/Rflx, CBC with Differential, Basic metabolic panel No Follow-up on file.

## 2013-05-04 NOTE — Addendum Note (Signed)
Addended by: Marchia Bond on: 05/04/2013 09:31 AM   Modules accepted: Orders

## 2013-05-05 LAB — CLOSTRIDIUM DIFFICILE EIA: CDIFTX: NEGATIVE

## 2013-06-08 ENCOUNTER — Ambulatory Visit (INDEPENDENT_AMBULATORY_CARE_PROVIDER_SITE_OTHER): Payer: Commercial Managed Care - HMO | Admitting: Family Medicine

## 2013-06-08 ENCOUNTER — Encounter: Payer: Self-pay | Admitting: Family Medicine

## 2013-06-08 VITALS — BP 130/62 | HR 72 | Temp 98.3°F | Wt 217.5 lb

## 2013-06-08 DIAGNOSIS — J069 Acute upper respiratory infection, unspecified: Secondary | ICD-10-CM

## 2013-06-08 MED ORDER — HYDROCODONE-HOMATROPINE 5-1.5 MG/5ML PO SYRP: 5.0000 mL | ORAL_SOLUTION | Freq: Four times a day (QID) | ORAL | Status: DC | PRN

## 2013-06-08 MED ORDER — AZITHROMYCIN 250 MG PO TABS: ORAL_TABLET | ORAL | Status: DC

## 2013-06-08 NOTE — Progress Notes (Signed)
SUBJECTIVE:  Damon [name] is a 80 y.o. male with h/o DM who complains of coryza, congestion, dry cough and left ear pain, pressure for 8 days. He denies a history of anorexia and chest pain and denies a history of asthma. Patient denies smoke cigarettes.    PCN allergic.  Patient Active Problem List   Diagnosis Date Noted  . Diarrhea 05/01/2013  . 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 DIABETIC PERIPHERAL NEUROPATHY 12/04/2005  . HYPERTRIGLYCERIDEMIA 12/04/2005  . OBESITY 12/04/2005  . HYPERTENSION 12/04/2005   Past Medical History  Diagnosis Date  . Bradycardia   . Diabetes mellitus   . Hypertension   . Chronic kidney disease     BAseline creatinine 1.4 to 1.5  . Arrhythmia     Baseline bradycardia  . Hard of hearing    Past Surgical History  Procedure Laterality Date  . Tonsillectomy    . Colonoscopy  01/01/2012    Procedure: COLONOSCOPY;  Surgeon: [name], MD;  Location: [facility] ENDOSCOPY;  Service: Endoscopy;  Laterality: N/A;   History  Substance Use Topics  . Smoking status: Never Smoker   . Smokeless tobacco: Former Systems developer    Types: Chew     Comment: chewing more than 40 years/chew 1PPD  . Alcohol Use: No   Family History  Problem Relation Age of Onset  . Diabetes type II Mother   . Hypertension Mother   . Diabetes type II Father   . Hypertension Father    Allergies  Allergen Reactions  . Penicillins Other (See Comments)    Reaction as a child   Current Outpatient Prescriptions on File Prior to Visit  Medication Sig Dispense Refill  . acetaminophen (TYLENOL) 325 MG tablet Take 2 tablets (650 mg total) by mouth every 6 (six) hours as needed (or Fever >/= 101).      Marland Kitchen aspirin 81 MG tablet Take 81 mg by mouth daily.        . calcitRIOL (ROCALTROL) 0.5 MCG capsule Take 1 capsule by mouth daily.      . calcium acetate (PHOSLO) 667 MG capsule Take 1 capsule by mouth daily.      . furosemide (LASIX) 40 MG tablet Take 1  tablet (40 mg total) by mouth every other day.  45 tablet  2  . gemfibrozil (LOPID) 600 MG tablet Take 1 tablet (600 mg total) by mouth 2 (two) times daily before a meal.  180 tablet  2  . insulin NPH-regular (HUMULIN 70/30) (70-30) 100 UNIT/ML injection Inject 35 units in the morning and 35 units at night. Syringes.  QS 3 months  30 mL  3  . Insulin Syringe-Needle U-100 (B-D INS SYRINGE 0.5CC/30GX1/2") 30G X 1/2" 0.5 ML MISC Use to inject insulin twice a day.  200 each  3  . lisinopril (PRINIVIL,ZESTRIL) 20 MG tablet Take 1 tablet (20 mg total) by mouth daily.  90 tablet  2  . tamsulosin (FLOMAX) 0.4 MG CAPS capsule Take 0.4 mg by mouth daily.       No current facility-administered medications on file prior to visit.   The PMH, PSH, Social History, Family History, Medications, and allergies have been reviewed in [facility], and have been updated if relevant.  OBJECTIVE: BP 130/62  Pulse 72  Temp(Src) 98.3 F (36.8 C) (Oral)  Wt 217 lb 8 oz (98.657 kg)  SpO2 97%  He appears well, vital signs are as noted. Left ear - TM bulding  Throat and  pharynx normal.  Neck supple. No adenopathy in the neck. Nose is congested. Sinuses non tender. Scattered exp wheezes  ASSESSMENT:  otitis media and bronchitis  PLAN: Zpack, hycodan prn coug Symptomatic therapy suggested: push fluids, rest and return office visit prn if symptoms persist or worsen. Call or return to clinic prn if these symptoms worsen or fail to improve as anticipated.

## 2013-06-08 NOTE — Progress Notes (Signed)
Pre visit review using our clinic review tool, if applicable. No additional management support is needed unless otherwise documented below in the visit note. 

## 2013-06-16 ENCOUNTER — Encounter: Payer: Self-pay | Admitting: Cardiovascular Disease

## 2013-06-22 ENCOUNTER — Ambulatory Visit: Payer: Commercial Managed Care - HMO | Admitting: Gastroenterology

## 2013-08-21 LAB — CBC
HCT: 47.2 % (ref 40.0–52.0)
HGB: 15.7 g/dL (ref 13.0–18.0)
MCH: 29.9 pg (ref 26.0–34.0)
MCHC: 33.3 g/dL (ref 32.0–36.0)
MCV: 90 fL (ref 80–100)
Platelet: 196 10*3/uL (ref 150–440)
RBC: 5.25 10*6/uL (ref 4.40–5.90)
RDW: 15.2 % — ABNORMAL HIGH (ref 11.5–14.5)
WBC: 9.6 10*3/uL (ref 3.8–10.6)

## 2013-08-21 LAB — BASIC METABOLIC PANEL
Anion Gap: 6 — ABNORMAL LOW (ref 7–16)
BUN: 40 mg/dL — ABNORMAL HIGH (ref 7–18)
Calcium, Total: 8.3 mg/dL — ABNORMAL LOW (ref 8.5–10.1)
Chloride: 109 mmol/L — ABNORMAL HIGH (ref 98–107)
Co2: 26 mmol/L (ref 21–32)
Creatinine: 2.08 mg/dL — ABNORMAL HIGH (ref 0.60–1.30)
EGFR (African American): 34 — ABNORMAL LOW
EGFR (Non-African Amer.): 30 — ABNORMAL LOW
Glucose: 203 mg/dL — ABNORMAL HIGH (ref 65–99)
Osmolality: 297 (ref 275–301)
Potassium: 4.4 mmol/L (ref 3.5–5.1)
Sodium: 141 mmol/L (ref 136–145)

## 2013-08-21 LAB — COMPREHENSIVE METABOLIC PANEL
Albumin: 4 g/dL (ref 3.4–5.0)
Alkaline Phosphatase: 135 U/L — ABNORMAL HIGH
Anion Gap: 8 (ref 7–16)
BUN: 40 mg/dL — ABNORMAL HIGH (ref 7–18)
Bilirubin,Total: 0.5 mg/dL (ref 0.2–1.0)
Calcium, Total: 8.4 mg/dL — ABNORMAL LOW (ref 8.5–10.1)
Chloride: 107 mmol/L (ref 98–107)
Co2: 24 mmol/L (ref 21–32)
Creatinine: 2.14 mg/dL — ABNORMAL HIGH (ref 0.60–1.30)
EGFR (African American): 33 — ABNORMAL LOW
EGFR (Non-African Amer.): 29 — ABNORMAL LOW
Glucose: 225 mg/dL — ABNORMAL HIGH (ref 65–99)
Osmolality: 294 (ref 275–301)
Potassium: 4.6 mmol/L (ref 3.5–5.1)
SGOT(AST): 27 U/L (ref 15–37)
SGPT (ALT): 25 U/L
Sodium: 139 mmol/L (ref 136–145)
Total Protein: 8 g/dL (ref 6.4–8.2)

## 2013-08-21 LAB — TSH: Thyroid Stimulating Horm: 3.31 u[IU]/mL

## 2013-08-21 LAB — CK-MB
CK-MB: 7.8 ng/mL — ABNORMAL HIGH (ref 0.5–3.6)
CK-MB: 6.5 ng/mL — ABNORMAL HIGH (ref 0.5–3.6)

## 2013-08-21 LAB — TROPONIN I
Troponin-I: <0.02 ng/mL
Troponin-I: <0.02 ng/mL

## 2013-08-21 LAB — PROTIME-INR
INR: 1.1
Prothrombin Time: 14.3 secs (ref 11.5–14.7)

## 2013-08-21 LAB — APTT: Activated PTT: 39.6 secs — ABNORMAL HIGH (ref 23.6–35.9)

## 2013-08-22 DIAGNOSIS — E119 Type 2 diabetes mellitus without complications: Secondary | ICD-10-CM

## 2013-08-22 DIAGNOSIS — I10 Essential (primary) hypertension: Secondary | ICD-10-CM

## 2013-08-22 DIAGNOSIS — R079 Chest pain, unspecified: Secondary | ICD-10-CM | POA: Diagnosis not present

## 2013-08-22 DIAGNOSIS — N184 Chronic kidney disease, stage 4 (severe): Secondary | ICD-10-CM | POA: Diagnosis not present

## 2013-08-22 DIAGNOSIS — I517 Cardiomegaly: Secondary | ICD-10-CM

## 2013-08-22 LAB — LIPID PANEL
Cholesterol: 127 mg/dL (ref 0–200)
HDL Cholesterol: 32 mg/dL — ABNORMAL LOW (ref 40–60)
Ldl Cholesterol, Calc: 76 mg/dL (ref 0–100)
Triglycerides: 96 mg/dL (ref 0–200)
VLDL Cholesterol, Calc: 19 mg/dL (ref 5–40)

## 2013-08-22 LAB — TROPONIN I: Troponin-I: <0.02 ng/mL

## 2013-08-22 LAB — CK-MB: CK-MB: 6.4 ng/mL — ABNORMAL HIGH (ref 0.5–3.6)

## 2013-08-23 DIAGNOSIS — R079 Chest pain, unspecified: Secondary | ICD-10-CM | POA: Diagnosis not present

## 2013-08-23 LAB — PLATELET COUNT: Platelet: 171 10*3/uL (ref 150–440)

## 2013-08-23 LAB — BASIC METABOLIC PANEL
Anion Gap: 7 (ref 7–16)
BUN: 26 mg/dL — ABNORMAL HIGH (ref 7–18)
Calcium, Total: 8.7 mg/dL (ref 8.5–10.1)
Chloride: 111 mmol/L — ABNORMAL HIGH (ref 98–107)
Co2: 24 mmol/L (ref 21–32)
Creatinine: 1.39 mg/dL — ABNORMAL HIGH (ref 0.60–1.30)
EGFR (African American): 56 — ABNORMAL LOW
EGFR (Non-African Amer.): 48 — ABNORMAL LOW
Glucose: 121 mg/dL — ABNORMAL HIGH (ref 65–99)
Osmolality: 289 (ref 275–301)
Potassium: 4.3 mmol/L (ref 3.5–5.1)
Sodium: 142 mmol/L (ref 136–145)

## 2013-08-24 ENCOUNTER — Encounter: Payer: Self-pay | Admitting: Cardiovascular Disease

## 2013-08-24 ENCOUNTER — Inpatient Hospital Stay: Payer: Self-pay | Admitting: Internal Medicine

## 2013-08-24 DIAGNOSIS — R079 Chest pain, unspecified: Secondary | ICD-10-CM | POA: Diagnosis not present

## 2013-08-24 DIAGNOSIS — I4891 Unspecified atrial fibrillation: Secondary | ICD-10-CM | POA: Diagnosis not present

## 2013-08-25 DIAGNOSIS — I4891 Unspecified atrial fibrillation: Secondary | ICD-10-CM | POA: Diagnosis not present

## 2013-08-25 LAB — BASIC METABOLIC PANEL
Anion Gap: 10 (ref 7–16)
BUN: 43 mg/dL — ABNORMAL HIGH (ref 7–18)
Calcium, Total: 8.8 mg/dL (ref 8.5–10.1)
Chloride: 106 mmol/L (ref 98–107)
Co2: 26 mmol/L (ref 21–32)
Creatinine: 1.84 mg/dL — ABNORMAL HIGH (ref 0.60–1.30)
EGFR (African American): 40 — ABNORMAL LOW
EGFR (Non-African Amer.): 34 — ABNORMAL LOW
Glucose: 128 mg/dL — ABNORMAL HIGH (ref 65–99)
Osmolality: 296 (ref 275–301)
Potassium: 4.1 mmol/L (ref 3.5–5.1)
Sodium: 142 mmol/L (ref 136–145)

## 2013-08-25 LAB — PRO B NATRIURETIC PEPTIDE: B-Type Natriuretic Peptide: 555 pg/mL — ABNORMAL HIGH (ref 0–450)

## 2013-09-04 ENCOUNTER — Telehealth: Payer: Self-pay | Admitting: Family Medicine

## 2013-09-04 ENCOUNTER — Encounter: Payer: Commercial Managed Care - HMO | Admitting: Cardiovascular Disease

## 2013-09-04 DIAGNOSIS — I5021 Acute systolic (congestive) heart failure: Secondary | ICD-10-CM

## 2013-09-04 NOTE — Telephone Encounter (Signed)
Pt's daughter is calling stating pt needs a referral to the Heart Failure Clinic in [facility]. Pt has follow-up appt with you on 09/11/2013. Please advise. Thank you!

## 2013-09-07 NOTE — Telephone Encounter (Signed)
Referral placed.

## 2013-09-08 ENCOUNTER — Ambulatory Visit: Payer: Self-pay | Admitting: Family

## 2013-09-09 ENCOUNTER — Ambulatory Visit (INDEPENDENT_AMBULATORY_CARE_PROVIDER_SITE_OTHER): Payer: Commercial Managed Care - HMO | Admitting: Cardiovascular Disease

## 2013-09-09 ENCOUNTER — Encounter: Payer: Self-pay | Admitting: Cardiovascular Disease

## 2013-09-09 VITALS — BP 98/52 | HR 68 | Ht 66.0 in | Wt 220.5 lb

## 2013-09-09 DIAGNOSIS — I5031 Acute diastolic (congestive) heart failure: Secondary | ICD-10-CM

## 2013-09-09 DIAGNOSIS — I10 Essential (primary) hypertension: Secondary | ICD-10-CM

## 2013-09-09 DIAGNOSIS — E1129 Type 2 diabetes mellitus with other diabetic kidney complication: Secondary | ICD-10-CM

## 2013-09-09 DIAGNOSIS — I5030 Unspecified diastolic (congestive) heart failure: Secondary | ICD-10-CM | POA: Insufficient documentation

## 2013-09-09 DIAGNOSIS — IMO0002 Reserved for concepts with insufficient information to code with codable children: Secondary | ICD-10-CM

## 2013-09-09 DIAGNOSIS — E1165 Type 2 diabetes mellitus with hyperglycemia: Secondary | ICD-10-CM

## 2013-09-09 DIAGNOSIS — I5032 Chronic diastolic (congestive) heart failure: Secondary | ICD-10-CM

## 2013-09-09 DIAGNOSIS — N058 Unspecified nephritic syndrome with other morphologic changes: Secondary | ICD-10-CM

## 2013-09-09 DIAGNOSIS — E1121 Type 2 diabetes mellitus with diabetic nephropathy: Secondary | ICD-10-CM

## 2013-09-09 DIAGNOSIS — E669 Obesity, unspecified: Secondary | ICD-10-CM

## 2013-09-09 DIAGNOSIS — E785 Hyperlipidemia, unspecified: Secondary | ICD-10-CM

## 2013-09-09 DIAGNOSIS — I509 Heart failure, unspecified: Secondary | ICD-10-CM

## 2013-09-09 NOTE — Assessment & Plan Note (Signed)
Blood pressure is very low today. We will cut his lisinopril back to 10 mg daily. Suggested he take Lasix 20 mg every other day

## 2013-09-09 NOTE — Assessment & Plan Note (Signed)
Recommended that he stay on his Lipitor

## 2013-09-09 NOTE — Assessment & Plan Note (Signed)
We have encouraged continued exercise, careful diet management in an effort to lose weight. 

## 2013-09-09 NOTE — Assessment & Plan Note (Signed)
Blood pressure is well controlled on today's visit. No changes made to the medications. 

## 2013-09-09 NOTE — Progress Notes (Signed)
Patient ID: Damon [name], male    DOB: Jun 18, 1933, 80 y.o.   MRN: [id number]  HPI Comments: Mr. Damon [name] is a 80 year old gentleman with history of type 2 diabetes on insulin, hypertension, renal disease, obesity with recent admission to the hospital for chest discomfort and cough. He was admitted 08/22/2013. He presents to establish care in the office.   He is diagnosed with bronchitis with bronchoconstriction, concern of diastolic CHF Easily seen at the heart failure clinic at [facility] yesterday. Renal function on arrival to the hospital; creatinine 1.8. This improved down to 1.3 with IV fluids Continues on Lasix 40 mg every other day He denies any lightheadedness or dizziness.  In the hospital he had echocardiogram that showed normal ejection fraction Also had a stress test that showed no ischemia with normal ejection fraction  Blood pressure in the office today is running low. Bilaterally systolic is [id number] or less  EKG shows normal sinus rhythm with rate 68 beats per minute, APCs noted      Outpatient Encounter Prescriptions as of 09/09/2013  Medication Sig  . albuterol-ipratropium (COMBIVENT) 18-103 MCG/ACT inhaler Inhale 1-2 puffs into the lungs every 4 (four) hours.  Marland Kitchen aspirin 81 MG tablet Take 81 mg by mouth daily.    Marland Kitchen atorvastatin (LIPITOR) 20 MG tablet Take 20 mg by mouth daily.  . furosemide (LASIX) 40 MG tablet Take 1 tablet (40 mg total) by mouth every other day.  Marland Kitchen gemfibrozil (LOPID) 600 MG tablet Take 1 tablet (600 mg total) by mouth 2 (two) times daily before a meal.  . insulin NPH-regular Human (NOVOLIN 70/30) (70-30) 100 UNIT/ML injection Inject 45 Units into the skin 2 (two) times daily with a meal.  . Insulin Syringe-Needle U-100 (B-D INS SYRINGE 0.5CC/30GX1/2") 30G X 1/2" 0.5 ML MISC Use to inject insulin twice a day.  . lisinopril (PRINIVIL,ZESTRIL) 20 MG tablet Take 1 tablet (20 mg total) by mouth daily.  . metoprolol tartrate (LOPRESSOR) 25 MG tablet Take 12.5 mg by  mouth 2 (two) times daily.  . potassium chloride SA (K-DUR,KLOR-CON) 20 MEQ tablet Take 20 mEq by mouth every other day.  . tamsulosin (FLOMAX) 0.4 MG CAPS capsule Take 0.4 mg by mouth daily.  . Vitamin D, Ergocalciferol, (DRISDOL) 50000 UNITS CAPS capsule Take 50,000 Units by mouth every 7 (seven) days.    Review of Systems  Constitutional: Negative.   HENT: Negative.   Eyes: Negative.   Respiratory: Negative.   Cardiovascular: Negative.   Gastrointestinal: Negative.   Endocrine: Negative.   Musculoskeletal: Negative.   Skin: Negative.   Allergic/Immunologic: Negative.   Neurological: Negative.   Hematological: Negative.   Psychiatric/Behavioral: Negative.   All other systems reviewed and are negative.   BP 98/52  Pulse 68  Ht 5\' 6"  (1.676 m)  Wt 220 lb 8 oz (100.018 kg)  BMI 35.61 kg/m2  Physical Exam  Nursing note and vitals reviewed. Constitutional: He is oriented to person, place, and time. He appears well-developed and well-nourished.  HENT:  Head: Normocephalic.  Nose: Nose normal.  Mouth/Throat: Oropharynx is clear and moist.  Eyes: Conjunctivae are normal. Pupils are equal, round, and reactive to light.  Neck: Normal range of motion. Neck supple. No JVD present.  Cardiovascular: Normal rate, regular rhythm, S1 normal, S2 normal, normal heart sounds and intact distal pulses.  Exam reveals no gallop and no friction rub.   No murmur heard. Pulmonary/Chest: Effort normal and breath sounds normal. No respiratory distress. He has no wheezes. He has  no rales. He exhibits no tenderness.  Abdominal: Soft. Bowel sounds are normal. He exhibits no distension. There is no tenderness.  Musculoskeletal: Normal range of motion. He exhibits no edema and no tenderness.  Lymphadenopathy:    He has no cervical adenopathy.  Neurological: He is alert and oriented to person, place, and time. Coordination normal.  Skin: Skin is warm and dry. No rash noted. No erythema.  Psychiatric:  He has a normal mood and affect. His behavior is normal. Judgment and thought content normal.      Assessment and Plan

## 2013-09-09 NOTE — Assessment & Plan Note (Signed)
Ejection fraction normal on recent echocardiogram. Recent coughing likely from bronchitis. Blood pressure is low and creatinine 1.8 on recent admission to the hospital improving to 1.3 after fluids. Blood pressure is low today with systolic pressure of 98. Suggested he take 20 mg of Lasix every other day. Suspect he can wean slowly off his Lasix and take this only as needed

## 2013-09-09 NOTE — Patient Instructions (Signed)
You are doing well. Please cut the furosemide  (lasix) in 1/2 every other day  Please cut the lisinopril in 1/2 daily  Please call us if you have new issues that need to be addressed before your next appt.  Your physician wants you to follow-up in: 6 months.  You will receive a reminder letter in the mail two months in advance. If you don't receive a letter, please call our office to schedule the follow-up appointment.

## 2013-09-10 ENCOUNTER — Ambulatory Visit: Payer: Commercial Managed Care - HMO | Admitting: Family Medicine

## 2013-09-11 ENCOUNTER — Ambulatory Visit (INDEPENDENT_AMBULATORY_CARE_PROVIDER_SITE_OTHER): Payer: Commercial Managed Care - HMO | Admitting: Family Medicine

## 2013-09-11 ENCOUNTER — Encounter: Payer: Self-pay | Admitting: Family Medicine

## 2013-09-11 ENCOUNTER — Ambulatory Visit: Payer: Commercial Managed Care - HMO | Admitting: Family Medicine

## 2013-09-11 VITALS — BP 98/58 | HR 58 | Temp 98.5°F | Wt 221.2 lb

## 2013-09-11 DIAGNOSIS — E162 Hypoglycemia, unspecified: Secondary | ICD-10-CM

## 2013-09-11 DIAGNOSIS — E1165 Type 2 diabetes mellitus with hyperglycemia: Secondary | ICD-10-CM

## 2013-09-11 DIAGNOSIS — I509 Heart failure, unspecified: Secondary | ICD-10-CM

## 2013-09-11 DIAGNOSIS — E1129 Type 2 diabetes mellitus with other diabetic kidney complication: Secondary | ICD-10-CM

## 2013-09-11 DIAGNOSIS — E669 Obesity, unspecified: Secondary | ICD-10-CM

## 2013-09-11 DIAGNOSIS — I5031 Acute diastolic (congestive) heart failure: Secondary | ICD-10-CM

## 2013-09-11 DIAGNOSIS — N189 Chronic kidney disease, unspecified: Secondary | ICD-10-CM

## 2013-09-11 DIAGNOSIS — E1121 Type 2 diabetes mellitus with diabetic nephropathy: Secondary | ICD-10-CM

## 2013-09-11 DIAGNOSIS — IMO0002 Reserved for concepts with insufficient information to code with codable children: Secondary | ICD-10-CM

## 2013-09-11 DIAGNOSIS — I10 Essential (primary) hypertension: Secondary | ICD-10-CM

## 2013-09-11 DIAGNOSIS — E785 Hyperlipidemia, unspecified: Secondary | ICD-10-CM

## 2013-09-11 DIAGNOSIS — N058 Unspecified nephritic syndrome with other morphologic changes: Secondary | ICD-10-CM

## 2013-09-11 MED ORDER — LISINOPRIL 2.5 MG PO TABS: 2.5000 mg | ORAL_TABLET | Freq: Every day | ORAL | Status: DC

## 2013-09-11 NOTE — Progress Notes (Signed)
Pre visit review using our clinic review tool, if applicable. No additional management support is needed unless otherwise documented below in the visit note. 

## 2013-09-11 NOTE — Assessment & Plan Note (Signed)
Asymptomatic now. Continue lasix, may have to continue to decrease dose. No changes to lasix made today.

## 2013-09-11 NOTE — Addendum Note (Signed)
Addended by: Royann Shivers A on: 09/11/2013 04:09 PM   Modules accepted: Orders

## 2013-09-11 NOTE — Assessment & Plan Note (Signed)
New- see above.

## 2013-09-11 NOTE — Progress Notes (Signed)
Subjective:   Patient ID: Damon [name], male    DOB: March 02, 1933, 80 y.o.   MRN: [id number]  Damon [name] is a pleasant 80 y.o. year old male with h/o CRF, HTN , DM and recent diagnosis of CHF, who presents to clinic today with Hospitalization Follow-up  on 09/11/2013  HPI:  Notes from [facility] reviewed. Admitted with chest pain and "choking like sensation" on 08/24/2013, discharged home on 08/26/2013.  CE neg x 3.  Lexiscan stress test was done given persistent chest pain on 08/24/2013 which was negative. Echocardiogram was also done that showed normal ejection fraction  CXR showed pulmonary edema consistent with CFH.  Placed on lasix, cr 1.8 which improved to 1.3 with IVFs. Discharged home on lasix 40 mg every other day.  Saw Dr. Rockey Situ for follow up on 8/19.  Note reviewed.  Was hypotensive, dose of lisinopril decreased to 10 mg daily.  He also decreased his lasix dose to 20 mg every other day.  Lab Results  Component Value Date   CREATININE 1.68* 05/01/2013   DM- seeing [name].  On NPH 25 units twice daily.  All this week- hypoglycemia in 40s and 50s.  "fell out in restaurant" yesterday- had to call EMS. Current Outpatient Prescriptions on File Prior to Visit  Medication Sig Dispense Refill  . albuterol-ipratropium (COMBIVENT) 18-103 MCG/ACT inhaler Inhale 1-2 puffs into the lungs every 4 (four) hours.      Marland Kitchen aspirin 81 MG tablet Take 81 mg by mouth daily.        Marland Kitchen atorvastatin (LIPITOR) 20 MG tablet Take 20 mg by mouth daily.      Marland Kitchen gemfibrozil (LOPID) 600 MG tablet Take 1 tablet (600 mg total) by mouth 2 (two) times daily before a meal.  180 tablet  2  . insulin NPH-regular Human (NOVOLIN 70/30) (70-30) 100 UNIT/ML injection Inject 45 Units into the skin 2 (two) times daily with a meal.      . Insulin Syringe-Needle U-100 (B-D INS SYRINGE 0.5CC/30GX1/2") 30G X 1/2" 0.5 ML MISC Use to inject insulin twice a day.  200 each  3  . metoprolol tartrate (LOPRESSOR) 25 MG tablet Take 12.5  mg by mouth 2 (two) times daily.      . potassium chloride SA (K-DUR,KLOR-CON) 20 MEQ tablet Take 20 mEq by mouth every other day.      . tamsulosin (FLOMAX) 0.4 MG CAPS capsule Take 0.4 mg by mouth daily.      . Vitamin D, Ergocalciferol, (DRISDOL) 50000 UNITS CAPS capsule Take 50,000 Units by mouth every 7 (seven) days.       No current facility-administered medications on file prior to visit.    Allergies  Allergen Reactions  . Penicillins Other (See Comments)    Reaction as a child    Past Medical History  Diagnosis Date  . Bradycardia   . Diabetes mellitus   . Hypertension   . Chronic kidney disease     BAseline creatinine 1.4 to 1.5  . Arrhythmia     Baseline bradycardia  . Hard of hearing   . CHF (congestive heart failure)     Past Surgical History  Procedure Laterality Date  . Tonsillectomy    . Colonoscopy  01/01/2012    Procedure: COLONOSCOPY;  Surgeon: [name], MD;  Location: [facility] ENDOSCOPY;  Service: Endoscopy;  Laterality: N/A;  . Parathyroidectomy      Family History  Problem Relation Age of Onset  . Diabetes type  II Mother   . Hypertension Mother   . Diabetes type II Father   . Hypertension Father     History   Social History  . Marital Status: Widowed    Spouse Name: N/A    Number of Children: N/A  . Years of Education: N/A   Occupational History  . Not on file.   Social History Main Topics  . Smoking status: Never Smoker   . Smokeless tobacco: Former Systems developer    Types: Chew     Comment: chewing more than 40 years/chew 1PPD  . Alcohol Use: No  . Drug Use: No  . Sexual Activity: Not on file   Other Topics Concern  . Not on file   Social History Narrative  . No narrative on file   The PMH, PSH, Social History, Family History, Medications, and allergies have been reviewed in [facility], and have been updated if relevant.   Review of Systems    See HPI No dizziness +fatigue Daughter feels he has been "spaced out"- sleeping all  day. Started to have some CP today. No SOB Objective:    BP 98/58  Pulse 58  Temp(Src) 98.5 F (36.9 C) (Oral)  Wt 221 lb 4 oz (100.358 kg)  SpO2 95%  Wt Readings from Last 3 Encounters:  09/11/13 221 lb 4 oz (100.358 kg)  09/09/13 220 lb 8 oz (100.018 kg)  06/08/13 217 lb 8 oz (98.657 kg)    Physical Exam   Constitutional: He is oriented to person, place, and time. He appears well-developed and well-nourished.  HENT:  Head: Normocephalic.  Nose: Nose normal.  Mouth/Throat: Oropharynx is clear and moist.  Eyes: Conjunctivae are normal. Pupils are equal, round, and reactive to light.  Neck: Normal range of motion. Neck supple. No JVD present.  Cardiovascular: Normal rate, regular rhythm, S1 normal, S2 normal, normal heart sounds and intact distal pulses. Exam reveals no gallop and no friction rub.  No murmur heard.  Pulmonary/Chest: Effort normal and breath sounds normal. No respiratory distress. He has no wheezes. He has no rales. He exhibits no tenderness.  Abdominal: Soft. Bowel sounds are normal. He exhibits no distension. There is no tenderness.  Musculoskeletal: Normal range of motion. He exhibits no edema and no tenderness.  Lymphadenopathy:  He has no cervical adenopathy.  Neurological: He is alert and oriented to person, place, and time. Coordination normal.  Skin: Skin is warm and dry. No rash noted. No erythema.  Psychiatric: He has a normal mood and affect. His behavior is normal. Judgment and thought content normal.         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

## 2013-09-11 NOTE — Patient Instructions (Addendum)
Stop taking Lisinopril 10 mg daily. Start taking Lisinopril 2.5 mg daily. Come back in 1 week to recheck your blood pressure.  Please decrease your Insulin to 20 units twice a day- you can increase this as needed. Call [name] with an update.  I will call you with your lab results.

## 2013-09-11 NOTE — Assessment & Plan Note (Signed)
Now with hypoglycemia. Will decrease dose of NPH to 20 units twice daily and I advised his daughter to let [name] know. Follow up in 1 week.

## 2013-09-11 NOTE — Assessment & Plan Note (Signed)
Now with hypotension likely due to added meds- metoprolol and lasix. Decrease dose of lisinopril to 2.5 mg daily to continue renal protection. Follow up in 1 week.

## 2013-09-11 NOTE — Assessment & Plan Note (Signed)
Recheck renal fxn today.

## 2013-09-12 LAB — COMPREHENSIVE METABOLIC PANEL
ALT: 13 U/L (ref 0–53)
AST: 11 U/L (ref 0–37)
Albumin: 3.9 g/dL (ref 3.5–5.2)
Alkaline Phosphatase: 108 U/L (ref 39–117)
BUN: 43 mg/dL — ABNORMAL HIGH (ref 6–23)
CO2: 22 mEq/L (ref 19–32)
Calcium: 8.5 mg/dL (ref 8.4–10.5)
Chloride: 102 mEq/L (ref 96–112)
Creat: 1.87 mg/dL — ABNORMAL HIGH (ref 0.50–1.35)
Glucose, Bld: 117 mg/dL — ABNORMAL HIGH (ref 70–99)
Potassium: 5.1 mEq/L (ref 3.5–5.3)
Sodium: 137 mEq/L (ref 135–145)
Total Bilirubin: 0.7 mg/dL (ref 0.2–1.2)
Total Protein: 6.4 g/dL (ref 6.0–8.3)

## 2013-09-12 LAB — CBC WITH DIFFERENTIAL/PLATELET
Basophils Absolute: 0 10*3/uL (ref 0.0–0.1)
Basophils Relative: 0 % (ref 0–1)
Eosinophils Absolute: 0.1 10*3/uL (ref 0.0–0.7)
Eosinophils Relative: 1 % (ref 0–5)
HCT: 42.1 % (ref 39.0–52.0)
Hemoglobin: 14.6 g/dL (ref 13.0–17.0)
Lymphocytes Relative: 23 % (ref 12–46)
Lymphs Abs: 1.9 10*3/uL (ref 0.7–4.0)
MCH: 29.2 pg (ref 26.0–34.0)
MCHC: 34.7 g/dL (ref 30.0–36.0)
MCV: 84.2 fL (ref 78.0–100.0)
Monocytes Absolute: 0.8 10*3/uL (ref 0.1–1.0)
Monocytes Relative: 10 % (ref 3–12)
Neutro Abs: 5.3 10*3/uL (ref 1.7–7.7)
Neutrophils Relative %: 66 % (ref 43–77)
Platelets: 166 10*3/uL (ref 150–400)
RBC: 5 MIL/uL (ref 4.22–5.81)
RDW: 15.6 % — ABNORMAL HIGH (ref 11.5–15.5)
WBC: 8.1 10*3/uL (ref 4.0–10.5)

## 2013-09-16 MED ORDER — FUROSEMIDE 20 MG PO TABS: ORAL_TABLET | ORAL | Status: DC

## 2013-09-16 NOTE — Addendum Note (Signed)
Addended by: Modena Nunnery on: 09/16/2013 01:07 PM   Modules accepted: Orders

## 2013-09-17 ENCOUNTER — Ambulatory Visit (INDEPENDENT_AMBULATORY_CARE_PROVIDER_SITE_OTHER): Payer: Commercial Managed Care - HMO | Admitting: Family Medicine

## 2013-09-17 VITALS — BP 102/60 | HR 71 | Temp 98.1°F | Wt 220.8 lb

## 2013-09-17 DIAGNOSIS — E1165 Type 2 diabetes mellitus with hyperglycemia: Secondary | ICD-10-CM

## 2013-09-17 DIAGNOSIS — E162 Hypoglycemia, unspecified: Secondary | ICD-10-CM

## 2013-09-17 DIAGNOSIS — I509 Heart failure, unspecified: Secondary | ICD-10-CM

## 2013-09-17 DIAGNOSIS — Z23 Encounter for immunization: Secondary | ICD-10-CM

## 2013-09-17 DIAGNOSIS — N183 Chronic kidney disease, stage 3 unspecified: Secondary | ICD-10-CM

## 2013-09-17 DIAGNOSIS — I5032 Chronic diastolic (congestive) heart failure: Secondary | ICD-10-CM

## 2013-09-17 DIAGNOSIS — N058 Unspecified nephritic syndrome with other morphologic changes: Secondary | ICD-10-CM

## 2013-09-17 DIAGNOSIS — I959 Hypotension, unspecified: Secondary | ICD-10-CM

## 2013-09-17 DIAGNOSIS — E1129 Type 2 diabetes mellitus with other diabetic kidney complication: Secondary | ICD-10-CM

## 2013-09-17 DIAGNOSIS — IMO0002 Reserved for concepts with insufficient information to code with codable children: Secondary | ICD-10-CM

## 2013-09-17 DIAGNOSIS — E1121 Type 2 diabetes mellitus with diabetic nephropathy: Secondary | ICD-10-CM

## 2013-09-17 LAB — BASIC METABOLIC PANEL
BUN: 29 mg/dL — ABNORMAL HIGH (ref 6–23)
CO2: 24 mEq/L (ref 19–32)
Calcium: 9.3 mg/dL (ref 8.4–10.5)
Chloride: 103 mEq/L (ref 96–112)
Creatinine, Ser: 1.5 mg/dL (ref 0.4–1.5)
GFR: 47.33 mL/min — ABNORMAL LOW (ref >60.00)
Glucose, Bld: 367 mg/dL — ABNORMAL HIGH (ref 70–99)
Potassium: 4.5 mEq/L (ref 3.5–5.1)
Sodium: 136 mEq/L (ref 135–145)

## 2013-09-17 NOTE — Assessment & Plan Note (Signed)
Improved. Continue current dose of rx.

## 2013-09-17 NOTE — Progress Notes (Signed)
Subjective:   Patient ID: Damon [name], male    DOB: 11-Nov-1933, 80 y.o.   MRN: [id number]  Damon [name] is a pleasant 80 y.o. year old male with h/o CRF, HTN , DM and recent diagnosis of CHF, who presents to clinic today for 1 week Follow-up  on 09/17/2013  HPI:  Saw him last week for hospital follow up.  Admitted with chest pain and "choking like sensation" on 08/24/2013, discharged home on 08/26/2013.  Was persistently hypotensive when I saw him so I decreased his lisinopril to 2.5 mg daily to continue renal protection.  [name] had decreased his lasix to 20 mg every other day.  Cr elevated when I checked it on 8/21 so I decreased his lasix to 10 mg every other day- note sent to Dr .Rockey Situ and he was in agreement with this plan.  Lab Results  Component Value Date   CREATININE 1.87* 09/11/2013   Wt Readings from Last 3 Encounters:  09/17/13 220 lb 12 oz (100.132 kg)  09/11/13 221 lb 4 oz (100.358 kg)  09/09/13 220 lb 8 oz (100.018 kg)    DM- seeing [name].  Was having hypoglycemia- decreased his NPH to 20 units twice daily and advised him letting [name] know. Feels better.  FSBS this am was 134. . Current Outpatient Prescriptions on File Prior to Visit  Medication Sig Dispense Refill  . albuterol-ipratropium (COMBIVENT) 18-103 MCG/ACT inhaler Inhale 1-2 puffs into the lungs every 4 (four) hours.      Marland Kitchen aspirin 81 MG tablet Take 81 mg by mouth daily.        Marland Kitchen atorvastatin (LIPITOR) 20 MG tablet Take 20 mg by mouth daily.      . furosemide (LASIX) 20 MG tablet Take 1/2 tab every other day  45 tablet  0  . gemfibrozil (LOPID) 600 MG tablet Take 1 tablet (600 mg total) by mouth 2 (two) times daily before a meal.  180 tablet  2  . insulin NPH-regular Human (NOVOLIN 70/30) (70-30) 100 UNIT/ML injection Inject 45 Units into the skin 2 (two) times daily with a meal.      . Insulin Syringe-Needle U-100 (B-D INS SYRINGE 0.5CC/30GX1/2") 30G X 1/2" 0.5 ML MISC Use to inject insulin  twice a day.  200 each  3  . lisinopril (PRINIVIL,ZESTRIL) 2.5 MG tablet Take 1 tablet (2.5 mg total) by mouth daily.  30 tablet  1  . metoprolol tartrate (LOPRESSOR) 25 MG tablet Take 12.5 mg by mouth 2 (two) times daily.      . potassium chloride SA (K-DUR,KLOR-CON) 20 MEQ tablet Take 20 mEq by mouth every other day.      . tamsulosin (FLOMAX) 0.4 MG CAPS capsule Take 0.4 mg by mouth daily.      . Vitamin D, Ergocalciferol, (DRISDOL) 50000 UNITS CAPS capsule Take 50,000 Units by mouth every 7 (seven) days.       No current facility-administered medications on file prior to visit.    Allergies  Allergen Reactions  . Penicillins Other (See Comments)    Reaction as a child    Past Medical History  Diagnosis Date  . Bradycardia   . Diabetes mellitus   . Hypertension   . Chronic kidney disease     BAseline creatinine 1.4 to 1.5  . Arrhythmia     Baseline bradycardia  . Hard of hearing   . CHF (congestive heart failure)     Past Surgical History  Procedure Laterality  Date  . Tonsillectomy    . Colonoscopy  01/01/2012    Procedure: COLONOSCOPY;  Surgeon: [name], MD;  Location: [facility] ENDOSCOPY;  Service: Endoscopy;  Laterality: N/A;  . Parathyroidectomy      Family History  Problem Relation Age of Onset  . Diabetes type II Mother   . Hypertension Mother   . Diabetes type II Father   . Hypertension Father     History   Social History  . Marital Status: Widowed    Spouse Name: N/A    Number of Children: N/A  . Years of Education: N/A   Occupational History  . Not on file.   Social History Main Topics  . Smoking status: Never Smoker   . Smokeless tobacco: Former Systems developer    Types: Chew     Comment: chewing more than 40 years/chew 1PPD  . Alcohol Use: No  . Drug Use: No  . Sexual Activity: Not on file   Other Topics Concern  . Not on file   Social History Narrative  . No narrative on file   The PMH, PSH, Social History, Family History, Medications,  and allergies have been reviewed in [facility], and have been updated if relevant.   Review of Systems    See HPI No dizziness More energy  No CP No SOB  Objective:    BP 102/60  Pulse 71  Temp(Src) 98.1 F (36.7 C) (Oral)  Wt 220 lb 12 oz (100.132 kg)  SpO2 95%  Wt Readings from Last 3 Encounters:  09/17/13 220 lb 12 oz (100.132 kg)  09/11/13 221 lb 4 oz (100.358 kg)  09/09/13 220 lb 8 oz (100.018 kg)    Physical Exam   Constitutional: He is oriented to person, place, and time. He appears well-developed and well-nourished.  HENT:  Head: Normocephalic.  Nose: Nose normal.  Mouth/Throat: Oropharynx is clear and moist.  Eyes: Conjunctivae are normal. Pupils are equal, round, and reactive to light.  Neck: Normal range of motion. Neck supple. No JVD present.  Cardiovascular: Normal rate, regular rhythm, S1 normal, S2 normal, normal heart sounds and intact distal pulses. Exam reveals no gallop and no friction rub.  No murmur heard.  Pulmonary/Chest: Effort normal and breath sounds normal. No respiratory distress. He has no wheezes. He has no rales. He exhibits no tenderness.  Abdominal: Soft. Bowel sounds are normal. He exhibits no distension. There is no tenderness.  Musculoskeletal: Normal range of motion. He exhibits no edema and no tenderness.  Lymphadenopathy:  He has no cervical adenopathy.  Neurological: He is alert and oriented to person, place, and time. Coordination normal.  Skin: Skin is warm and dry. No rash noted. No erythema.  Psychiatric: He has a normal mood and affect. His behavior is normal. Judgment and thought content normal.         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

## 2013-09-17 NOTE — Assessment & Plan Note (Signed)
Improved.  No longer symptomatic.

## 2013-09-17 NOTE — Patient Instructions (Signed)
I will call you with your lab results. No changes to your medications for now.

## 2013-09-17 NOTE — Assessment & Plan Note (Signed)
Deteriorated. Decreased dose of lasix to 10 mg every other day. Continue current dose- weight stable and no signs of volume overload. Recheck BMET today. Orders Placed This Encounter  Procedures  . Flu Vaccine QUAD 36+ mos PF IM (Fluarix Quad PF)  . Basic metabolic panel

## 2013-09-17 NOTE — Progress Notes (Signed)
Pre visit review using our clinic review tool, if applicable. No additional management support is needed unless otherwise documented below in the visit note. 

## 2013-09-17 NOTE — Assessment & Plan Note (Signed)
Appears stable on lower dose of lasix. No changes made.

## 2013-09-29 ENCOUNTER — Telehealth: Payer: Self-pay | Admitting: Family Medicine

## 2013-09-29 NOTE — Telephone Encounter (Signed)
error 

## 2013-10-26 ENCOUNTER — Other Ambulatory Visit: Payer: Self-pay

## 2013-10-26 MED ORDER — FUROSEMIDE 20 MG PO TABS: ORAL_TABLET | ORAL | Status: DC

## 2013-10-26 MED ORDER — LISINOPRIL 2.5 MG PO TABS: 2.5000 mg | ORAL_TABLET | Freq: Every day | ORAL | Status: DC

## 2013-10-26 NOTE — Telephone Encounter (Signed)
Spoke to pt and informed her Rx has been sent to the results

## 2013-10-26 NOTE — Telephone Encounter (Signed)
Advised daughter to await confirmation from [facility] to ensure that the Rx has been processed before we send 10mo supply to [facility]. Whoever received the order first is who the insurance will pay.

## 2013-10-26 NOTE — Telephone Encounter (Signed)
Damon [name] pts daughter request 90 day refill to [facility] and 2 week refill to [address] so pt will not be out of med. Damon [name] said pts med were decreased 08/2013; pt presently taking furosemide 20 mg  1/2 tab every other day and lisinopril 2.5 mg one tab daily. Damon [name] wants to know if pt is to continue these dosages; pt has had no h/a, dizziness, CP. Or SOB.  Damon [name] has no way to ck pts BP. When does Dr Deborra Medina want pt to have f/u appt.

## 2014-02-02 ENCOUNTER — Other Ambulatory Visit: Payer: Self-pay | Admitting: *Deleted

## 2014-02-02 MED ORDER — FUROSEMIDE 20 MG PO TABS: ORAL_TABLET | ORAL | Status: DC

## 2014-02-02 MED ORDER — GEMFIBROZIL 600 MG PO TABS: 600.0000 mg | ORAL_TABLET | Freq: Two times a day (BID) | ORAL | Status: DC

## 2014-02-02 MED ORDER — LISINOPRIL 2.5 MG PO TABS: 2.5000 mg | ORAL_TABLET | Freq: Every day | ORAL | Status: DC

## 2014-05-14 NOTE — Op Note (Signed)
PATIENT NAME:  Damon [name], Damon [name] MR#:  [id number] DATE OF BIRTH:  06/30/1933  DATE OF PROCEDURE:  10/10/2012  OPERATION PERFORMED:  Right lower parathyroidectomy.   PREOPERATIVE DIAGNOSIS: Primary hyperparathyroidism.   POSTOPERATIVE DIAGNOSIS: Primary hyperparathyroidism, secondary to right lower parathyroid adenoma (1.1 grams, including small portion of right lower thyroid nodule).   SURGEON: Consuela Mimes, [name]   FIRST ASSISTANT: Chauncey Reading, [name]   ANESTHESIA: General.   PROCEDURE IN DETAIL: The patient was placed supine on the operating room table and prepped and draped in the usual sterile fashion. A 1.5 inch incision was made in the skin crease in the neck just above the suprasternal notch, and right clavicular head. This was carried down through the platysma muscle and subplatysmal flaps were created superiorly and inferiorly. The anterior border of the sternocleidomastoid muscle was dissected out and the sternocleidomastoid muscle was retracted laterally. The lateral border of the strap muscles were dissected out and they were retracted medially. This exposed the inferior pole of the thyroid. A parathyroid adenoma was seen just at this area and there was an associated right lower thyroid nodule, such that the adenoma was half inside and half outside the thyroid tissue itself. The only way that I could remove the parathyroid adenoma was to remove the small thyroid nodule along with it. Therefore, the parenchyma of the thyroid was divided here with the electrocautery and hemostasis on that area was achieved with the electrocautery. Otherwise, the dissection was completed with minimal manipulation. There was a fairly large inferior pole thyroid vein that required medium Hemoclip placements in that area.  The gland with the attached thyroid nodule was sent for frozen section and returned hyperplastic parathyroid tissue with attached thyroid tissue and the entire specimen weighed 1100 mg.  At  least three quarters of this was the parathyroid adenoma. The intermediate fascia of the neck was closed with a single horizontal mattress suture of 3-0 Monocryl and the platysma was closed with a running 3-0 Monocryl and the skin was reapproximated with a running subcuticular 5-0 Monocryl and suture strips. The patient tolerated the procedure well. There were no complications.   ____________________________ Consuela Mimes, MD [id number] D: 10/10/2012 08:39:00 ET T: 10/10/2012 09:15:29 ET JOB#: [id number]  cc: A. Lavone Orn, MD Consuela Mimes, MD, <Dictator>  Consuela Mimes MD ELECTRONICALLY SIGNED 10/10/2012 18:01

## 2014-05-15 NOTE — H&P (Signed)
PATIENT NAME:  Damon [name], Damon [name] MR#:  [id number] DATE OF BIRTH:  28-Jun-1933  DATE OF ADMISSION:  08/21/2013  REFERRING PHYSICIAN:  Dr. Joni Fears   PRIMARY CARE PHYSICIAN: Dr. Arnette Norris  CHIEF COMPLAINT: Chest year.  HISTORY OF PRESENT ILLNESS: This is a 80-old gentleman with history of hypertension, diabetes type 2 insulin-requiring as well as CKD who presents with chest pain. Describes acute onset chest pain which occurred at rest, while he was watching TV, retrosternal in location, 8 out of 10 in intensity, described it as a "choking" sensation for quality, nonradiating. Denies any worsening or relieving factors. He has some associated shortness of breath during the episode; however, no symptoms at this time.   REVIEW OF SYSTEMS: CONSTITUTIONAL: Denies fevers, chills, fatigue, weakness.  EYES: Denies blurred vision, double vision or eye pain.  EARS, NOSE, THROAT: Denies tinnitus, ear pain, hearing loss.  RESPIRATORY: Denies cough, wheeze, shortness of breath.  CARDIOVASCULAR: Denies chest pain, palpitations. PULMONARY: Positive for shortness of breath as described above. Denies any cough or wheeze.  CARDIOVASCULAR: Positive for chest pain as described above. Denies any palpitations or edema.  GASTROINTESTINAL: Denies nausea, vomiting, diarrhea, abdominal pain.  GENITOURINARY: Denies dysuria, hematuria.  ENDOCRINE: Denies nocturia or thyroid problems.  HEMATOLOGIC AND LYMPHATIC: Denies easy bruising or bleeding.  SKIN: Denies rash or lesions.  MUSCULOSKELETAL: Denies pain in neck, back, shoulder, knees, hips or arthritic symptoms.  NEUROLOGIC: Denies paralysis, paresthesias.  PSYCHIATRIC: No anxiety or depressive symptoms.  Otherwise, full review of systems by me is negative.   PAST MEDICAL HISTORY: Hypertension, diabetes insulin requiring, as well as chronic kidney disease with baseline creatinine which appears to be about 1.8 to 2 per our records.   SOCIAL HISTORY: Denies any alcohol,  tobacco, or drug usage. Previously used smokeless tobacco, last at about 5 years ago   FAMILY HISTORY: Positive for hypertension, diabetes.   ALLERGIES: PENICILLIN.   HOME MEDICATIONS: Aspirin 81 mg p.o. daily, lisinopril 20 mg p.o. daily, Flomax 0.4 mg p.o. daily, Humulin 70/30,  45 units b.i.d., gemfibrozil 600 mg p.o. b.i.d., Lasix 40 mg every other day, vitamin D3 50,000 international units weekly on Mondays.   PHYSICAL EXAMINATION:  VITAL SIGNS: Temperature 98.1, heart rate 64, respirations 20, blood pressure 162/72, saturating 95% on room air. Weight 97.5 kg.  GENERAL: Well-nourished, well-developed, Caucasian gentleman, currently in no acute distress.  HEAD: Normocephalic, atraumatic.  EYES: Pupils equal, round, reactive to light. Extraocular muscles intact. No scleral icterus.  MOUTH: Moist mucous membranes. Poor dentition. No abscess noted.  EARS, NOSE AND THROAT: Clear without exudates. No external lesions.  NECK: Supple. No thyromegaly. No nodules. No JVD.  PULMONARY: Clear to auscultation bilaterally without wheezes, rubs or rhonchi. No accessory muscles used. Good respiratory effort.  CHEST: Nontender on palpation.  CARDIOVASCULAR: S1, S2, irregular rate, irregular rhythm. No murmurs, rubs, or gallops. No edema. Pedal pulses 2+ bilaterally.  GASTROINTESTINAL: Soft, nontender, nondistended. No masses. Positive bowel sounds. No hepatosplenomegaly.  MUSCULOSKELETAL: No swelling, clubbing, or edema. Range of motion full in all extremities  NEUROLOGIC: Cranial nerves II through XII intact. No gross focal neurological deficits. Sensation intact. Reflexes intact.  SKIN: No ulceration, lesions, rashes or cyanosis. Skin warm and dry. Turgor intact.  PSYCHIATRIC: Mood and affect within normal limits. The patient is awake, alert and oriented  x 3. Insight and judgment intact.   LABORATORY DATA: EKG performed revealing atrial fibrillation which is rate controlled. No ST or T wave  abnormalities. Chest x-ray performed, no acute  cardiopulmonary process. Abdominal film performed, no acute abdominal process.    Sodium 139, potassium 4.6, chloride 107, bicarbonate 24, BUN 40, creatinine 2.14, glucose 225. Troponin less than 0.02. WBC 9.6, hemoglobin 15.7, platelets of 196,000. INR of 1.1.   ASSESSMENT AND PLAN:  A 81 year old gentleman with history of hypertension, diabetes, chronic kidney disease, presenting with chest pain.  1. Chest pain. Admit to telemetry under observational status. Initiate aspirin and statin therapy. Trend cardiac enzymes x 3. Of note, he does follow with Dr. (Dictation Anomaly) <MISSING TEXT>  of cardiology, so if cardiology consult is required, we will use that group.  2. Diabetes type 2. Continue with 70/30 insulin and his home schedule. Add insulin sliding scale with q. 6 hour Accu-Cheks.  3. Atrial fibrillation. The patient is unsure if he ever has carried the diagnosis of atrial fibrillation.  We will check a TSH, a transthoracic echocardiogram as well as place on telemetry. Currently, he is actually bradycardic for heart rate.  4. Hypertension. Continue with lisinopril.  5. Venous thromboembolism prophylaxis with heparin subcutaneously.   CODE STATUS:  Full code.  TIME SPENT: 45 minutes.     ____________________________ Aaron Mose. Malaak Stach, MD dkh:[id number] D: 08/21/2013 22:52:37 ET T: 08/21/2013 23:06:04 ET JOB#: [id number]  cc: Aaron Mose. Gurnie Duris, MD, <Dictator> Phillip Sandler Woodfin Ganja MD ELECTRONICALLY SIGNED 08/22/2013 1:04

## 2014-05-15 NOTE — Consult Note (Signed)
General Aspect Asked to see this 81 year old patient for evaluation of chest pain   Present Illness 81 year-old obese diabetic male presenting with intermittent chest discomfort since last night. He describes episodes of a 'tingling' sensation in the substernal region and right chest, worse with coughing. No association with physical activity. He has never had pain like this before. He denies shortness of breath, edema, heart palpitations, orthopnea, or PND. He states he can feel the pain 'coming on' at times and denies any other associated symptoms. There is no radiation of pain to the shoulders/jaw/arms. No pain at present.   Physical Exam:  GEN well developed, obese   HEENT PERRL, moist oral mucosa   NECK supple  No masses   RESP rhonchi   CARD Regular rate and rhythm  No murmur   ABD denies tenderness  no hernia  soft  distended  normal BS   LYMPH negative neck   EXTR negative cyanosis/clubbing, negative edema   SKIN No rashes   NEURO cranial nerves intact, motor/sensory function intact   PSYCH A+O to time, place, person, good insight, lethargic   Review of Systems:  General: No Complaints   Skin: No Complaints   ENT: No Complaints   Eyes: No Complaints   Neck: No Complaints   Respiratory: Frequent cough   Cardiovascular: Chest pain or discomfort   Gastrointestinal: No Complaints   Genitourinary: No Complaints   Vascular: No Complaints   Musculoskeletal: Muscle or Joint Stiffness   Neurologic: No Complaints   Hematologic: No Complaints   Endocrine: No Complaints   Psychiatric: No Complaints   Review of Systems: All other systems were reviewed and found to be negative   Medications/Allergies Reviewed Medications/Allergies reviewed   Family & Social History:  Family and Social History:  Family History Negative   Social History positive tobacco (Greater than 1 year), smokeless tobacco - now quit   Place of Living Home   EKG:  EKG NSR    Interpretation NSR with PAC's, first degree AVB, no acute ST-T changes    Penicillin: Rash   Impression 1. Chest pain at rest with typical and atypical features. Lungs are rhonchorous and pt with cough, ? chest pain associated with this. However, ACS clearly in differential in this patient with longstanding obesity and insulin-requiring diabetes. While CKMB levels have mild elevation, cardiac troponins are negative and this is a more sensitive and specific marker. Recommend Lexiscan Myoview stress study Monday am. Would treat with DVT-prophylaxis dose heparin but I don't think he requires systemic anticoagulation unless troponins become positive. If stress test is normal/low risk, would not anticipate further cardiac evaluation. Cardiac cath would come with significant risk considering his chronic kidney disease/diabetes.  2. Type 2 diabetes, insulin-requiring. Needs major lifestyle modification but doubt he will be able to achieve significant weight loss.   3. HTN - BP elevated earlier during admission, but now within range. Continue to follow and treat accordingly. He has fairly long first degree AV block and heart rate about 60 bpm, so would avoid pushing AV nodal blocker doses.   4. CKD, Stage 4. Likely diabetic nephropathy. Maintain adequate hydration, avoid nephrotoxins as much as possible.   5. Morbid obesity. Counseling done.  6. Cardiac rhythm. Initially there was concern about atrial fibrillation. However, after review of EKGs and tele dating back to hospital admission, I do not see evidence of AF. He has Sinus rhythm and sinus brady with first degree AV block and at times has  frequent PAC's.   Will follow with you. Thanks and please call if any questions arise.   Electronic Signatures: Sherren Mocha (MD)  (Signed 01-Aug-15 12:13)  Authored: General Aspect/Present Illness, History and Physical Exam, Review of System, Family & Social History, EKG , Allergies, Impression/Plan   Last  Updated: 01-Aug-15 12:13 by Sherren Mocha (MD)

## 2014-05-15 NOTE — Discharge Summary (Signed)
PATIENT NAME:  Damon [name], Damon [name] MR#:  [id number] DATE OF BIRTH:  12-01-1933  DATE OF ADMISSION:  08/24/2013 DATE OF DISCHARGE:  08/26/2013  PRIMARY CARE PHYSICIAN:  Marciano Sequin. Deborra Medina, MD.  PRIMARY CARDIOLOGY:  Dr. Annie Main.  ADMITTING DIAGNOSIS:  Chest pain.  DISCHARGE DIAGNOSES:  1.  Acute diastolic congestive heart failure with left ventricular ejection fraction 55%. 2.  Acute bronchitis.  Discharge home with p.o. levofloxacin for 5 days. 3.  Chest pain.  Rule out acute myocardial infarction.  Negative Lexiscan test.  SECONDARY DISCHARGE DIAGNOSES: 1.  Hypertension. 2.  Type 2 diabetes mellitus. 3.  Obesity.  CONSULTATIONS:  [facility], Dr. Rockey Situ.   PROCEDURES: Lexiscan stress test, which is normal.   BRIEF HISTORY OF PRESENT ILLNESS: The patient is a 81 year old male with chronic history of chronic renal insufficiency, hypertension, diabetes mellitus type 2, came into the ED with a chief complaint of chest pain and choking-like sensation. The patient was admitted to the hospital to rule out acute MI. Please review H and P for details.   HOSPITAL COURSE:   1.  The patient's chest pain, acute MI is ruled out with 3 negative sets of cardiac enzymes.  The patient was persistently complaining of atypical chest pain. Cardiology has recommended Lexiscan stress tests and the patient had this test done on August 3,  which was negative.     2.  Acute diastolic congestive heart failure. The patient was complaining of shortness of breath, regarding which repeat chest x-ray was done which has revealed pulmonary edema. The patient was placed on a Lasix via IV and strict in's and out's also were monitored. Daily weights were also monitored. Within no time, patient's pulmonary edema is significantly improved. The patient's creatinine is back to his baseline at 1.8; however, as we are planning to continue Lasix as an outpatient with potassium supplements, the plan is to monitor his renal  function closely. Also, as the patient is on statin and lopid , we need to continue monitoring his LFTs.  3.  Acute kidney failure on chronic kidney disease. The patient was given IV fluids initially.  At the time of admission, 1 liter of IV fluids, following which the patient's creatinine went down to 1.3; however when Lasix was given for acute diastolic congestive heart failure, the patient's creatinine went up to 1.8, which is his normal baseline.  The plan is to monitor his renal function closely and PCP to consider repeating CMP as an outpatient in a week.  4.  Acute bronchitis. The patient was complaining of chest tightness associated with wheezing and some cough starting from August 4. He is placed on levofloxacin and steroids.  Nebulizer treatments were provided and incentive spirometry was recommended. With steroids and antibiotics, his condition significantly improved. Today, the patient being stable.    CONDITION:  At the time of discharge, stable.   DISPOSITION: Home to family care.  There is also concern about atrial fibrillation, but cardiology has reviewed EKGs, which has revealed normal sinus rhythm with multiple PVCs. At this point, we are not quite sure whether the patient has paroxysmal atrial fibrillation or not.   HOME MEDICATIONS: Gemfibrozil 600 mg 1 tablet p.o. b.i.d., lisinopril 20 mg p.o. once daily, tamsulosin 0.4 mg 1 capsule p.o. once daily. Aspirin 81 mg once daily, Humulin 70/30,  45 units subcutaneously 2 times a day, vitamin D3, 50,000 units 1 capsule p.o. once a week on Mondays, furosemide 40 mg p.o. every other day,  atorvastatin  20 mg 1 tablet p.o. once daily, metoprolol tartrate 25 mg 1/2 tablet p.o. b.i.d., levofloxacin 250 mg 1 capsule p.o. once daily for 5 days, albuterol ipratropium inhaler 1 puff inhalation 4 times a day, Sterapred dose pack 1 pack a day as prescribed, Klor-Con 20 mEq p.o. every other day.   DIET:  Low-fat, low-cholesterol, diabetic diet.    ACTIVITY: As tolerated.   FOLLOWUP:  Follow up with primary care physician in 1-2 weeks and Dr. Rockey Situ, cardiology in 1-2 weeks.  PCP to [facility] regarding LFTs and renal function in a week. Also, the patient will be benefited with outpatient sleep study to rule out obstructive sleep apnea. Also, the patient has recommended to follow up with [facility] as an outpatient.  Plan of care was discussed in detail with the patient and family members at bedside.   TOTAL TIME SPENT ON DISCHARGE:  45 minutes.    ____________________________ Nicholes Mango, MD ag:ds D: 08/26/2013 14:08:01 ET T: 08/26/2013 14:28:02 ET JOB#: [id number]  cc: Nicholes Mango, MD, <Dictator> Nicholes Mango MD ELECTRONICALLY SIGNED 09/03/2013 8:14

## 2014-08-12 ENCOUNTER — Telehealth: Payer: Self-pay | Admitting: *Deleted

## 2014-08-12 NOTE — Telephone Encounter (Signed)
Agree with recommendations. Thank you.

## 2014-08-12 NOTE — Telephone Encounter (Signed)
Daughter left voicemail at triage requesting to speak with Dr. Deborra Medina directly

## 2014-08-12 NOTE — Telephone Encounter (Signed)
Spoke to pts daughter who was wanting to know if he was required to have an OV with endo each time his Rx is due. I advised her that she would have to contact them directly. She states that her father is non-compliant and labs are always abnormal. Advised her that if he becomes compliant and has better control of diet, he MAY not have to follow up as often, as he MAY not have to be monitored as closely, but to contact endo, as this would be the providers decision. I informed her that I was not aware of their direct policies and it would be their decision as to when he follows up and how medications are dispensed

## 2014-12-15 ENCOUNTER — Other Ambulatory Visit: Payer: Commercial Managed Care - HMO

## 2014-12-20 ENCOUNTER — Encounter: Payer: Commercial Managed Care - HMO | Admitting: Family Medicine

## 2015-01-26 ENCOUNTER — Other Ambulatory Visit (INDEPENDENT_AMBULATORY_CARE_PROVIDER_SITE_OTHER): Payer: PPO

## 2015-01-26 ENCOUNTER — Other Ambulatory Visit: Payer: Self-pay | Admitting: Internal Medicine

## 2015-01-26 DIAGNOSIS — Z125 Encounter for screening for malignant neoplasm of prostate: Secondary | ICD-10-CM | POA: Diagnosis not present

## 2015-01-26 DIAGNOSIS — E785 Hyperlipidemia, unspecified: Secondary | ICD-10-CM | POA: Diagnosis not present

## 2015-01-26 DIAGNOSIS — E559 Vitamin D deficiency, unspecified: Secondary | ICD-10-CM | POA: Diagnosis not present

## 2015-01-26 LAB — COMPREHENSIVE METABOLIC PANEL
ALT: 15 U/L (ref 0–53)
AST: 15 U/L (ref 0–37)
Albumin: 4.1 g/dL (ref 3.5–5.2)
Alkaline Phosphatase: 104 U/L (ref 39–117)
BUN: 21 mg/dL (ref 6–23)
CO2: 24 mEq/L (ref 19–32)
Calcium: 9 mg/dL (ref 8.4–10.5)
Chloride: 108 mEq/L (ref 96–112)
Creatinine, Ser: 1.42 mg/dL (ref 0.40–1.50)
GFR: 51.02 mL/min — ABNORMAL LOW (ref >60.00)
Glucose, Bld: 119 mg/dL — ABNORMAL HIGH (ref 70–99)
Potassium: 4.2 mEq/L (ref 3.5–5.1)
Sodium: 141 mEq/L (ref 135–145)
Total Bilirubin: 0.6 mg/dL (ref 0.2–1.2)
Total Protein: 6.5 g/dL (ref 6.0–8.3)

## 2015-01-26 LAB — LIPID PANEL
Cholesterol: 127 mg/dL (ref 0–200)
HDL: 37.8 mg/dL — ABNORMAL LOW (ref >39.00)
LDL Cholesterol: 75 mg/dL (ref 0–99)
NonHDL: 88.91
Total CHOL/HDL Ratio: 3
Triglycerides: 69 mg/dL (ref 0.0–149.0)
VLDL: 13.8 mg/dL (ref 0.0–40.0)

## 2015-01-26 LAB — VITAMIN D 25 HYDROXY (VIT D DEFICIENCY, FRACTURES): VITD: 31.85 ng/mL (ref 30.00–100.00)

## 2015-01-26 LAB — CBC
HCT: 50.1 % (ref 39.0–52.0)
Hemoglobin: 16.6 g/dL (ref 13.0–17.0)
MCHC: 33.2 g/dL (ref 30.0–36.0)
MCV: 88.8 fl (ref 78.0–100.0)
Platelets: 169 10*3/uL (ref 150.0–400.0)
RBC: 5.64 Mil/uL (ref 4.22–5.81)
RDW: 15 % (ref 11.5–15.5)
WBC: 6.6 10*3/uL (ref 4.0–10.5)

## 2015-01-26 LAB — PSA, MEDICARE: PSA: 2.91 ng/ml (ref 0.10–4.00)

## 2015-01-31 ENCOUNTER — Encounter: Payer: PPO | Admitting: Family Medicine

## 2015-02-18 DIAGNOSIS — E1140 Type 2 diabetes mellitus with diabetic neuropathy, unspecified: Secondary | ICD-10-CM | POA: Diagnosis not present

## 2015-02-18 DIAGNOSIS — Z794 Long term (current) use of insulin: Secondary | ICD-10-CM | POA: Diagnosis not present

## 2015-02-18 DIAGNOSIS — B351 Tinea unguium: Secondary | ICD-10-CM | POA: Diagnosis not present

## 2015-02-18 DIAGNOSIS — L851 Acquired keratosis [keratoderma] palmaris et plantaris: Secondary | ICD-10-CM | POA: Diagnosis not present

## 2015-02-28 ENCOUNTER — Ambulatory Visit (INDEPENDENT_AMBULATORY_CARE_PROVIDER_SITE_OTHER): Payer: PPO | Admitting: Family Medicine

## 2015-02-28 ENCOUNTER — Encounter: Payer: Self-pay | Admitting: Family Medicine

## 2015-02-28 VITALS — BP 124/68 | HR 49 | Temp 97.5°F | Ht 64.75 in | Wt 219.0 lb

## 2015-02-28 DIAGNOSIS — E785 Hyperlipidemia, unspecified: Secondary | ICD-10-CM

## 2015-02-28 DIAGNOSIS — I10 Essential (primary) hypertension: Secondary | ICD-10-CM | POA: Diagnosis not present

## 2015-02-28 DIAGNOSIS — E1121 Type 2 diabetes mellitus with diabetic nephropathy: Secondary | ICD-10-CM

## 2015-02-28 DIAGNOSIS — Y92009 Unspecified place in unspecified non-institutional (private) residence as the place of occurrence of the external cause: Secondary | ICD-10-CM

## 2015-02-28 DIAGNOSIS — I5032 Chronic diastolic (congestive) heart failure: Secondary | ICD-10-CM | POA: Diagnosis not present

## 2015-02-28 DIAGNOSIS — W19XXXA Unspecified fall, initial encounter: Secondary | ICD-10-CM | POA: Insufficient documentation

## 2015-02-28 DIAGNOSIS — N183 Chronic kidney disease, stage 3 (moderate): Secondary | ICD-10-CM | POA: Diagnosis not present

## 2015-02-28 DIAGNOSIS — Z23 Encounter for immunization: Secondary | ICD-10-CM

## 2015-02-28 DIAGNOSIS — E162 Hypoglycemia, unspecified: Secondary | ICD-10-CM

## 2015-02-28 DIAGNOSIS — E1165 Type 2 diabetes mellitus with hyperglycemia: Secondary | ICD-10-CM | POA: Diagnosis not present

## 2015-02-28 DIAGNOSIS — Z0000 Encounter for general adult medical examination without abnormal findings: Secondary | ICD-10-CM | POA: Diagnosis not present

## 2015-02-28 DIAGNOSIS — Y92099 Unspecified place in other non-institutional residence as the place of occurrence of the external cause: Secondary | ICD-10-CM

## 2015-02-28 DIAGNOSIS — IMO0002 Reserved for concepts with insufficient information to code with codable children: Secondary | ICD-10-CM

## 2015-02-28 NOTE — Assessment & Plan Note (Signed)
New- advised to decrease NPH to 30 units twice daily.

## 2015-02-28 NOTE — Assessment & Plan Note (Signed)
No signs of infection.  Right arm re wrapped in gauze today.

## 2015-02-28 NOTE — Patient Instructions (Signed)
Great to see you. Please decrease your insulin to 30 units twice daily.

## 2015-02-28 NOTE — Assessment & Plan Note (Signed)
He would like for me to manage this. Check a1c today, decrease dose of insulin. Prevnar 13.

## 2015-02-28 NOTE — Assessment & Plan Note (Signed)
The patients weight, height, BMI and visual acuity have been recorded in the chart.  Cognitive function assessed.   I have made referrals, counseling and provided education to the patient based review of the above and I have provided the pt with a written personalized care plan for preventive services.  Prevnar 13 and Td today.

## 2015-02-28 NOTE — Progress Notes (Signed)
Subjective:   Patient ID: Damon [name], male    DOB: Jul 25, 1933, 82 y.o.   MRN: [id number]  Damon [name] is a pleasant 82 y.o. year old male with h/o CRF, HTN , DM  CHF, who presents to clinic today for  Annual Exam  and follow up of chronic medical conditions on 02/28/2015  HPI:  I have personally reviewed the Medicare Annual Wellness questionnaire and have noted 1. The patient's medical and social history 2. Their use of alcohol, tobacco or illicit drugs 3. Their current medications and supplements 4. The patient's functional ability including ADL's, fall risks, home safety risks and hearing or visual             impairment. 5. Diet and physical activities 6. Evidence for depression or mood disorders  End of life wishes discussed and updated in Social History.  The roster of all physicians providing medical care to patient - is listed in the CareTeams section of the chart.  Zoster 11/22/08 Td 11/22/08 Pneumovax 6/19/009 Colonoscopy 01/01/2012  Fell at home yesterday.  EMS came to his out evaluated him and wrapped his right arm in gauze which was bleeding.  CRF- followed by renal.  Last saw Dr. Candiss Norse in 03/2014.  Note reviewed.  Lab Results  Component Value Date   CREATININE 1.42 01/26/2015   DM- followed by [name].  Last note in Epic was from 03/2014 but he has been seen more recently.  Does not want to return to endo. Checks FSBS every morning.  Brings meter in- ranging 60s- 110 fasting.  Does feel a liltle shaky at times.  Currently taking NPH to 35 units twice daily and advised him letting [name] know. Feels better.  FSBS this am was 134.  CHF- was followed by Dr. Rockey Situ but has not been seen since 09/09/2013.  Lab Results  Component Value Date   CREATININE 1.42 01/26/2015   Wt Readings from Last 3 Encounters:  02/28/15 219 lb (99.338 kg)  09/08/13 222 lb (100.699 kg)  09/17/13 220 lb 12 oz (100.132 kg)    HLD- currently taking Lopid 600 mg twice daily.  No  longer taking statin. Lab Results  Component Value Date   CHOL 127 01/26/2015   HDL 37.80* 01/26/2015   LDLCALC 75 01/26/2015   TRIG 69.0 01/26/2015   CHOLHDL 3 01/26/2015    . Current Outpatient Prescriptions on File Prior to Visit  Medication Sig Dispense Refill  . aspirin 81 MG tablet Take 81 mg by mouth daily.      . furosemide (LASIX) 20 MG tablet Take 1/2 tab every other day 45 tablet 3  . gemfibrozil (LOPID) 600 MG tablet Take 1 tablet (600 mg total) by mouth 2 (two) times daily before a meal. 180 tablet 3  . insulin NPH-regular Human (NOVOLIN 70/30) (70-30) 100 UNIT/ML injection Inject 45 Units into the skin 2 (two) times daily with a meal.    . Insulin Syringe-Needle U-100 (B-D INS SYRINGE 0.5CC/30GX1/2") 30G X 1/2" 0.5 ML MISC Use to inject insulin twice a day. 200 each 3  . lisinopril (PRINIVIL,ZESTRIL) 2.5 MG tablet Take 1 tablet (2.5 mg total) by mouth daily. 90 tablet 3  . potassium chloride SA (K-DUR,KLOR-CON) 20 MEQ tablet Take 20 mEq by mouth every other day.    . tamsulosin (FLOMAX) 0.4 MG CAPS capsule Take 0.4 mg by mouth daily.     No current facility-administered medications on file prior to visit.    Allergies  Allergen Reactions  . Penicillins Other (See Comments)    Reaction as a child    Past Medical History  Diagnosis Date  . Bradycardia   . Diabetes mellitus   . Hypertension   . Chronic kidney disease     BAseline creatinine 1.4 to 1.5  . Arrhythmia     Baseline bradycardia  . Hard of hearing   . CHF (congestive heart failure) [facility])     Past Surgical History  Procedure Laterality Date  . Tonsillectomy    . Colonoscopy  01/01/2012    Procedure: COLONOSCOPY;  Surgeon: [name], MD;  Location: [facility] ENDOSCOPY;  Service: Endoscopy;  Laterality: N/A;  . Parathyroidectomy      Family History  Problem Relation Age of Onset  . Diabetes type II Mother   . Hypertension Mother   . Diabetes type II Father   . Hypertension Father      Social History   Social History  . Marital Status: Widowed    Spouse Name: N/A  . Number of Children: N/A  . Years of Education: N/A   Occupational History  . Not on file.   Social History Main Topics  . Smoking status: Never Smoker   . Smokeless tobacco: Former Systems developer    Types: Chew     Comment: chewing more than 40 years/chew 1PPD  . Alcohol Use: No  . Drug Use: No  . Sexual Activity: Not on file   Other Topics Concern  . Not on file   Social History Narrative   Would desire CPR   The PMH, PSH, Social History, Family History, Medications, and allergies have been reviewed in [facility], and have been updated if relevant.   Review of Systems  Constitutional: Negative.   HENT: Negative.   Eyes: Negative.   Cardiovascular: Negative.   Gastrointestinal: Negative.   Endocrine: Negative.   Genitourinary: Negative.   Musculoskeletal: Negative.   Allergic/Immunologic: Negative.   Neurological: Positive for dizziness.  Hematological: Negative.   Psychiatric/Behavioral: Negative.   All other systems reviewed and are negative.      Objective:    BP 124/68 mmHg  Pulse 49  Temp(Src) 97.5 F (36.4 C) (Oral)  Ht 5' 4.75" (1.645 m)  Wt 219 lb (99.338 kg)  BMI 36.71 kg/m2  SpO2 97%  Wt Readings from Last 3 Encounters:  02/28/15 219 lb (99.338 kg)  09/08/13 222 lb (100.699 kg)  09/17/13 220 lb 12 oz (100.132 kg)    Physical Exam  Constitutional: He is oriented to person, place, and time. He appears well-developed and well-nourished. No distress.  edentulous  HENT:  Head: Normocephalic.  Eyes: Conjunctivae are normal.  Cardiovascular: Normal rate.   Pulmonary/Chest: Effort normal.  Abdominal: Soft.  Musculoskeletal: Normal range of motion.  Neurological: He is alert and oriented to person, place, and time.  Skin: He is not diaphoretic.  ecchymosis and healing skin tear right arm  Psychiatric: He has a normal mood and affect. His behavior is normal. Judgment and  thought content normal.  Nursing note and vitals reviewed.    Constitutional: He is oriented to person, place, and time. He appears well-developed and well-nourished.  HENT:  Head: Normocephalic.  Nose: Nose normal.  Mouth/Throat: Oropharynx is clear and moist.  Eyes: Conjunctivae are normal. Pupils are equal, round, and reactive to light.  Neck: Normal range of motion. Neck supple. No JVD present.  Cardiovascular: Normal rate, regular rhythm, S1 normal, S2 normal, normal heart sounds and intact distal pulses.  Exam reveals no gallop and no friction rub.  No murmur heard.  Pulmonary/Chest: Effort normal and breath sounds normal. No respiratory distress. He has no wheezes. He has no rales. He exhibits no tenderness.  Abdominal: Soft. Bowel sounds are normal. He exhibits no distension. There is no tenderness.  Musculoskeletal: Normal range of motion. He exhibits no edema and no tenderness.  Lymphadenopathy:  He has no cervical adenopathy.  Neurological: He is alert and oriented to person, place, and time. Coordination normal.  Skin: Skin is warm and dry. No rash noted. No erythema.  Psychiatric: He has a normal mood and affect. His behavior is normal. Judgment and thought content normal.         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

## 2015-02-28 NOTE — Addendum Note (Signed)
Addended by: Modena Nunnery on: 02/28/2015 04:00 PM   Modules accepted: Orders

## 2015-02-28 NOTE — Assessment & Plan Note (Signed)
Well controlled.  No changes made. 

## 2015-03-01 LAB — HEMOGLOBIN A1C: Hgb A1c MFr Bld: 6.8 % — ABNORMAL HIGH (ref 4.6–6.5)

## 2015-03-06 ENCOUNTER — Emergency Department: Payer: PPO

## 2015-03-06 ENCOUNTER — Emergency Department
Admission: EM | Admit: 2015-03-06 | Discharge: 2015-03-06 | Disposition: A | Discharge status: Home / Self Care | Payer: PPO | Attending: Emergency Medicine | Admitting: Emergency Medicine

## 2015-03-06 DIAGNOSIS — T07XXXA Unspecified multiple injuries, initial encounter: Secondary | ICD-10-CM

## 2015-03-06 DIAGNOSIS — S5011XA Contusion of right forearm, initial encounter: Secondary | ICD-10-CM | POA: Diagnosis not present

## 2015-03-06 DIAGNOSIS — Y998 Other external cause status: Secondary | ICD-10-CM | POA: Insufficient documentation

## 2015-03-06 DIAGNOSIS — S59911A Unspecified injury of right forearm, initial encounter: Secondary | ICD-10-CM | POA: Diagnosis present

## 2015-03-06 DIAGNOSIS — W1839XA Other fall on same level, initial encounter: Secondary | ICD-10-CM | POA: Diagnosis not present

## 2015-03-06 DIAGNOSIS — E1121 Type 2 diabetes mellitus with diabetic nephropathy: Secondary | ICD-10-CM | POA: Insufficient documentation

## 2015-03-06 DIAGNOSIS — Y92194 Driveway of other specified residential institution as the place of occurrence of the external cause: Secondary | ICD-10-CM | POA: Diagnosis not present

## 2015-03-06 DIAGNOSIS — Y9389 Activity, other specified: Secondary | ICD-10-CM | POA: Insufficient documentation

## 2015-03-06 DIAGNOSIS — S40812A Abrasion of left upper arm, initial encounter: Secondary | ICD-10-CM | POA: Diagnosis not present

## 2015-03-06 DIAGNOSIS — S60512A Abrasion of left hand, initial encounter: Secondary | ICD-10-CM | POA: Insufficient documentation

## 2015-03-06 DIAGNOSIS — Z79899 Other long term (current) drug therapy: Secondary | ICD-10-CM | POA: Diagnosis not present

## 2015-03-06 DIAGNOSIS — S6000XA Contusion of unspecified finger without damage to nail, initial encounter: Secondary | ICD-10-CM

## 2015-03-06 DIAGNOSIS — Z794 Long term (current) use of insulin: Secondary | ICD-10-CM | POA: Diagnosis not present

## 2015-03-06 DIAGNOSIS — S80211A Abrasion, right knee, initial encounter: Secondary | ICD-10-CM | POA: Insufficient documentation

## 2015-03-06 DIAGNOSIS — E1140 Type 2 diabetes mellitus with diabetic neuropathy, unspecified: Secondary | ICD-10-CM | POA: Insufficient documentation

## 2015-03-06 DIAGNOSIS — I129 Hypertensive chronic kidney disease with stage 1 through stage 4 chronic kidney disease, or unspecified chronic kidney disease: Secondary | ICD-10-CM | POA: Diagnosis not present

## 2015-03-06 DIAGNOSIS — M79641 Pain in right hand: Secondary | ICD-10-CM | POA: Diagnosis not present

## 2015-03-06 DIAGNOSIS — N189 Chronic kidney disease, unspecified: Secondary | ICD-10-CM | POA: Diagnosis not present

## 2015-03-06 DIAGNOSIS — Z7982 Long term (current) use of aspirin: Secondary | ICD-10-CM | POA: Diagnosis not present

## 2015-03-06 DIAGNOSIS — M79631 Pain in right forearm: Secondary | ICD-10-CM | POA: Diagnosis not present

## 2015-03-06 DIAGNOSIS — Z880 Allergy status to penicillin: Secondary | ICD-10-CM | POA: Insufficient documentation

## 2015-03-06 DIAGNOSIS — M7989 Other specified soft tissue disorders: Secondary | ICD-10-CM | POA: Diagnosis not present

## 2015-03-06 DIAGNOSIS — S60511A Abrasion of right hand, initial encounter: Secondary | ICD-10-CM | POA: Diagnosis not present

## 2015-03-06 DIAGNOSIS — S60221A Contusion of right hand, initial encounter: Secondary | ICD-10-CM | POA: Diagnosis not present

## 2015-03-06 MED ORDER — BACITRACIN ZINC 500 UNIT/GM EX OINT
TOPICAL_OINTMENT | Freq: Two times a day (BID) | CUTANEOUS | Status: DC
  Administered 2015-03-06: 1 via TOPICAL
  Filled 2015-03-06: qty 0.9

## 2015-03-06 NOTE — Discharge Instructions (Signed)
Contusion A contusion is a deep bruise. Contusions happen when an injury causes bleeding under the skin. Symptoms of bruising include pain, swelling, and discolored skin. The skin may turn blue, purple, or yellow. HOME CARE   Rest the injured area.  If told, put ice on the injured area.  Put ice in a plastic bag.  Place a towel between your skin and the bag.  Leave the ice on for 20 minutes, 2-3 times per day.  If told, put light pressure (compression) on the injured area using an elastic bandage. Make sure the bandage is not too tight. Remove it and put it back on as told by your doctor.  If possible, raise (elevate) the injured area above the level of your heart while you are sitting or lying down.  Take over-the-counter and prescription medicines only as told by your doctor. GET HELP IF:  Your symptoms do not get better after several days of treatment.  Your symptoms get worse.  You have trouble moving the injured area. GET HELP RIGHT AWAY IF:   You have very bad pain.  You have a loss of feeling (numbness) in a hand or foot.  Your hand or foot turns pale or cold.   This information is not intended to replace advice given to you by your health care provider. Make sure you discuss any questions you have with your health care provider.   Document Released: 06/27/2007 Document Revised: 09/29/2014 Document Reviewed: 05/26/2014 Elsevier Interactive Patient Education 2016 [place] abrasions for any signs of infection such as fever or pus. Keep area clean and dry. Clean with mild soap and water and change dressings daily. Follow-up with your doctor if any continued problems.  Continue regular medication as directed. Begin using your walker at home around the house and especially outside.

## 2015-03-06 NOTE — ED Provider Notes (Signed)
[facility] Emergency Department Provider Note ____________________________________________  Time seen: Approximately 2:26 PM  I have reviewed the triage vital signs and the nursing notes.   HISTORY  Chief Complaint Fall   HPI Damon [name] is a 82 y.o. male is here with family after falling in the driveway approximately 2 hours prior to his arrival. Patient states that he laid in the driveway for approximately 1 hour. He has abrasions to his right arm, left arm and right knee. He states that he did not hit his head and there was no loss of consciousness. He has remained alert and oriented per family members. This is the second time he has fallen in the last 2 weeks. Patient does not want to use his walker. He states he has to at home and still does not want to use either one of them. Patient has gravel on his right forearm. He states that he has had a tetanus shot last week secondary to an injury.Patient denies any pain at this time. He states he has bruises easily. Patient states this was not a syncopal fall.   Past Medical History  Diagnosis Date  . Bradycardia   . Diabetes mellitus   . Hypertension   . Chronic kidney disease     BAseline creatinine 1.4 to 1.5  . Arrhythmia     Baseline bradycardia  . Hard of hearing   . CHF (congestive heart failure) [facility])     Patient Active Problem List   Diagnosis Date Noted  . Medicare annual wellness visit, subsequent 02/28/2015  . Fall at home 02/28/2015  . Chronic renal disease 09/11/2013  . Hypoglycemia 09/11/2013  . Diastolic CHF ([place]) [id number]  . Hyperlipidemia 09/09/2013  . 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 DIABETIC PERIPHERAL NEUROPATHY 12/04/2005  . OBESITY 12/04/2005  . Essential hypertension 12/04/2005    Past Surgical History  Procedure Laterality Date  . Tonsillectomy    . Colonoscopy  01/01/2012    Procedure: COLONOSCOPY;  Surgeon: [name], MD;  Location[facility] END[facility]COPY;  Service: Endoscopy;  Laterality: N/A;  . Parathyroidectomy      Current Outpatient Rx  Name  Route  Sig  Dispense  Refill  . aspirin 81 MG tablet   Oral   Take 81 mg by mouth daily.           . Ergocalciferol (VITAMIN D2 PO)   Oral   Take by mouth.         . furosemide (LASIX) 20 MG tablet      Take 1/2 tab every other day   45 tablet   3   . gemfibrozil (LOPID) 600 MG tablet   Oral   Take 1 tablet (600 mg total) by mouth 2 (two) times daily before a meal.   180 tablet   3   . insulin NPH-regular Human (NOVOLIN 70/30) (70-30) 100 UNIT/ML injection   Subcutaneous   Inject 45 Units into the skin 2 (two) times daily with a meal.         . Insulin Syringe-Needle U-100 (B-D INS SYRINGE 0.5CC/30GX1/2") 30G X 1/2" 0.5 ML MISC      Use to inject insulin twice a day.   200 each   3     Dx 250.40, 583.81   . lisinopril (PRINIVIL,ZESTRIL) 2.5 MG tablet   Oral   Take 1 tablet (2.5 mg total) by mouth daily.   90 tablet   3   .  potassium chloride SA (K-DUR,KLOR-CON) 20 MEQ tablet   Oral   Take 20 mEq by mouth every other day.         . tamsulosin (FLOMAX) 0.4 MG CAPS capsule   Oral   Take 0.4 mg by mouth daily.           Allergies Penicillins  Family History  Problem Relation Age of Onset  . Diabetes type II Mother   . Hypertension Mother   . Diabetes type II Father   . Hypertension Father     Social History Social History  Substance Use Topics  . Smoking status: Never Smoker   . Smokeless tobacco: Former Systems developer    Types: Chew     Comment: chewing more than 40 years/chew 1PPD  . Alcohol Use: No    Review of Systems Constitutional: No fever/chills Eyes: No visual changes. ENT: No trauma Cardiovascular: Denies chest pain. Respiratory: Denies shortness of breath. Gastrointestinal:   No nausea, no vomiting.   Musculoskeletal: Negative for back pain. Skin: Positive for skin tear, abrasion right forearm and  right hand. There are superficial abrasions to the left arm and right knee. Neurological: Negative for headaches, focal weakness or numbness.  10-point ROS otherwise negative.  ____________________________________________   PHYSICAL EXAM:  VITAL SIGNS: ED Triage Vitals  Enc Vitals Group     BP 03/06/15 1211 158/68 mmHg     Pulse Rate 03/06/15 1211 53     Resp 03/06/15 1211 20     Temp 03/06/15 1211 97.6 F (36.4 C)     Temp Source 03/06/15 1211 Oral     SpO2 03/06/15 1211 98 %     Weight 03/06/15 1211 220 lb (99.791 kg)     Height 03/06/15 1211 5\' 8"  (1.727 m)     Head Cir --      Peak Flow --      Pain Score 03/06/15 1212 0     Pain Loc --      Pain Edu? --      Excl. in [place]? --     Constitutional: Alert and oriented. Well appearing and in no acute distress. Eyes: Conjunctivae are normal. PERRL. EOMI. Head: Atraumatic. No abrasions are noted to forehead face or scalp. Nose: No trauma. Neck: No stridor.  No cervical tenderness to palpation and range of motion is without restriction or pain. Cardiovascular: Normal rate, regular rhythm. Grossly normal heart sounds.  Good peripheral circulation. Respiratory: Normal respiratory effort.  No retractions. Lungs CTAB. Gastrointestinal: Soft and nontender. No distention. Sounds are normoactive 4 quadrants. Musculoskeletal: There is superficial abrasion noted to the right knee however patient's range of motion is without restriction and there is no effusion noted. Right forearm there is moderate ecchymosis and superficial abrasion to the elbow without active bleeding. There is ecchymosis to the forearm and superficial lacerations and abrasions to the right hand including digits. There is no active bleeding present. Left arm is superficial abrasions. Neurologic:  Normal speech and language. No gross focal neurologic deficits are appreciated. No gait instability. Skin:  Skin is warm, dry. See noted above. Psychiatric: Mood and affect are  normal. Speech and behavior are normal.  ____________________________________________   LABS (all labs ordered are listed, but only abnormal results are displayed)  Labs Reviewed - No data to display RADIOLOGY X-ray right forearm no fracture or dislocation per radiologist. Right hand no fracture or dislocation was noted per radiologist. 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  Areas were irrigated copiously with normal saline by nurses. Areas were cleaned and dressed. Patient is to watch for signs of infection and clean areas daily. Patient is to follow-up with his primary care doctor if any continued problems or return to the emergency room if any severe worsening of his symptoms. He was also encouraged to use his walker when outside especially to walk. ____________________________________________   FINAL CLINICAL IMPRESSION(S) / ED DIAGNOSES  Final diagnoses:  Abrasions of multiple sites  Contusion of right forearm, initial encounter  Contusion of right hand including fingers, initial encounter      Johnn Hai, PA-C 03/06/15 2145  Eula Listen, MD 03/07/15 1534

## 2015-03-06 NOTE — ED Notes (Signed)
Patient states that he was walking back from the mailbox and fell, he is not sure how he fell. Denies LOC or hitting head. Patient fell on gravel. Has injuries to his right arm and hand and left hand. Patient currently has bandages on wounds, bleeding is controlled at this time, patient denies any pain.

## 2015-03-06 NOTE — ED Notes (Addendum)
Pt arrives to ER via POV with daughter. Pt fell in driveway approx 2 hours PTA. Pt states that he laid in driveway for approx 1 hour. Pt takes ASA, denies blood thinners. Pt denies hitting his head, denies syncope of LOC. Pt c/o right arm pain, abrasion to right arm, left arm and right knee. No abrasions to head. Pt alert and oriented X4, active, cooperative, pt in NAD. RR even and unlabored, color WNL.    Pt bandaged on arrival.

## 2015-03-06 NOTE — ED Notes (Signed)
Patients wounds un-bandaged and irrigated with normal saline. Patient has multiple skin tears to the right elbow, skin tears to the 2nd, 3rd, and 4th, digits on the right right hand, and a skin tear to the knuckle of the 2nd digit on the left hand.

## 2015-03-06 NOTE — ED Notes (Addendum)
CBG 76 upon arrival to pt house per family. CBG runs 80-90 per family.

## 2015-03-07 DIAGNOSIS — S61213A Laceration without foreign body of left middle finger without damage to nail, initial encounter: Secondary | ICD-10-CM | POA: Diagnosis not present

## 2015-03-07 DIAGNOSIS — S61215A Laceration without foreign body of left ring finger without damage to nail, initial encounter: Secondary | ICD-10-CM | POA: Diagnosis not present

## 2015-03-07 DIAGNOSIS — S61411A Laceration without foreign body of right hand, initial encounter: Secondary | ICD-10-CM | POA: Diagnosis not present

## 2015-03-14 ENCOUNTER — Ambulatory Visit (INDEPENDENT_AMBULATORY_CARE_PROVIDER_SITE_OTHER): Payer: PPO | Admitting: Family Medicine

## 2015-03-14 ENCOUNTER — Encounter: Payer: Self-pay | Admitting: Family Medicine

## 2015-03-14 VITALS — BP 124/70 | HR 60 | Temp 97.8°F | Wt 220.5 lb

## 2015-03-14 DIAGNOSIS — Y92009 Unspecified place in unspecified non-institutional (private) residence as the place of occurrence of the external cause: Principal | ICD-10-CM

## 2015-03-14 DIAGNOSIS — M79641 Pain in right hand: Secondary | ICD-10-CM | POA: Diagnosis not present

## 2015-03-14 DIAGNOSIS — W19XXXS Unspecified fall, sequela: Secondary | ICD-10-CM

## 2015-03-14 MED ORDER — DOXYCYCLINE HYCLATE 100 MG PO TABS
100.0000 mg | ORAL_TABLET | Freq: Two times a day (BID) | ORAL | Status: DC
Start: 2015-03-14 — End: 2015-10-11

## 2015-03-14 NOTE — Assessment & Plan Note (Signed)
Discussed again the importance of using his walker. He has been very resistant to this.

## 2015-03-14 NOTE — Patient Instructions (Signed)
Good to see you. Your right hand does look infected.  Take antibiotic as directed- doxycyline 1 tablet twice daily for 10 days.  Keep cleaning it like you are.

## 2015-03-14 NOTE — Progress Notes (Signed)
Pre visit review using our clinic review tool, if applicable. No additional management support is needed unless otherwise documented below in the visit note. 

## 2015-03-14 NOTE — Assessment & Plan Note (Signed)
New- with erythema and warmth around abrasions. Place him on doxycyline 100 mg twice daily x 10 days- eRx sent.  Continue cleaning the wounds and keep them uncovered. Call or return to clinic prn if these symptoms worsen or fail to improve as anticipated. The patient indicates understanding of these issues and agrees with the plan.

## 2015-03-14 NOTE — Progress Notes (Signed)
Subjective:   Patient ID: Damon [name], male    DOB: 08-Jan-1934, 82 y.o.   MRN: [id number]  Damon [name] is a pleasant 82 y.o. year old male who presents to clinic today with fell last week  on 03/14/2015  HPI:  Martin Majestic to ER on 03/06/15 after fall at home.  Notes reviewed.  Xray of right forearm was done which negative.  Sent home and advised to follow up with me here today.  Tetanus immunization UTD.  Here today because he is concerned that his right hand is getting infected.  Has abrasions on all of the knuckles on his right hand.  Has been cleaning them twice a day with peroxide.  They are getting puffier and red, warm to touch. No fevers, chills, nausea or vomiting.  Current Outpatient Prescriptions on File Prior to Visit  Medication Sig Dispense Refill  . aspirin 81 MG tablet Take 81 mg by mouth daily.      . Ergocalciferol (VITAMIN D2 PO) Take by mouth.    . furosemide (LASIX) 20 MG tablet Take 1/2 tab every other day 45 tablet 3  . gemfibrozil (LOPID) 600 MG tablet Take 1 tablet (600 mg total) by mouth 2 (two) times daily before a meal. 180 tablet 3  . insulin NPH-regular Human (NOVOLIN 70/30) (70-30) 100 UNIT/ML injection Inject 45 Units into the skin 2 (two) times daily with a meal.     . Insulin Syringe-Needle U-100 (B-D INS SYRINGE 0.5CC/30GX1/2") 30G X 1/2" 0.5 ML MISC Use to inject insulin twice a day. 200 each 3  . lisinopril (PRINIVIL,ZESTRIL) 2.5 MG tablet Take 1 tablet (2.5 mg total) by mouth daily. 90 tablet 3  . tamsulosin (FLOMAX) 0.4 MG CAPS capsule Take 0.4 mg by mouth daily.    . potassium chloride SA (K-DUR,KLOR-CON) 20 MEQ tablet Take 20 mEq by mouth every other day. Reported on 03/14/2015     No current facility-administered medications on file prior to visit.    Allergies  Allergen Reactions  . Penicillins Other (See Comments)    Reaction as a child    Past Medical History  Diagnosis Date  . Bradycardia   . Diabetes mellitus   . Hypertension     . Chronic kidney disease     BAseline creatinine 1.4 to 1.5  . Arrhythmia     Baseline bradycardia  . Hard of hearing   . CHF (congestive heart failure) [facility])     Past Surgical History  Procedure Laterality Date  . Tonsillectomy    . Colonoscopy  01/01/2012    Procedure: COLONOSCOPY;  Surgeon: [name], MD;  Location: [facility] ENDOSCOPY;  Service: Endoscopy;  Laterality: N/A;  . Parathyroidectomy      Family History  Problem Relation Age of Onset  . Diabetes type II Mother   . Hypertension Mother   . Diabetes type II Father   . Hypertension Father     Social History   Social History  . Marital Status: Widowed    Spouse Name: N/A  . Number of Children: N/A  . Years of Education: N/A   Occupational History  . Not on file.   Social History Main Topics  . Smoking status: Never Smoker   . Smokeless tobacco: Former Systems developer    Types: Chew     Comment: chewing more than 40 years/chew 1PPD  . Alcohol Use: No  . Drug Use: No  . Sexual Activity: Not on file   Other Topics Concern  .  Not on file   Social History Narrative   Would desire CPR   The PMH, PSH, Social History, Family History, Medications, and allergies have been reviewed in [facility], and have been updated if relevant.   Review of Systems  Constitutional: Negative.   Respiratory: Negative.   Cardiovascular: Negative.   Gastrointestinal: Negative.   Skin: Positive for wound.  All other systems reviewed and are negative.      Objective:    BP 124/70 mmHg  Pulse 60  Temp(Src) 97.8 F (36.6 C) (Oral)  Wt 220 lb 8 oz (100.018 kg)   Physical Exam  Constitutional: He is oriented to person, place, and time. He appears well-developed and well-nourished. No distress.  HENT:  Head: Normocephalic.  Eyes: Conjunctivae are normal.  Cardiovascular: Normal rate.   Pulmonary/Chest: Effort normal.  Neurological: He is alert and oriented to person, place, and time. No cranial nerve deficit.  Skin: He is not  diaphoretic.  Abrasions on PIPs digits 2, 3, 4- there is some surrounding erythema and warmth No TTP over joints  Psychiatric: He has a normal mood and affect. His behavior is normal. Judgment and thought content normal.  Nursing note and vitals reviewed.         Assessment & Plan:   Fall at home, sequela  Right hand pain No Follow-up on file.

## 2015-03-28 ENCOUNTER — Other Ambulatory Visit: Payer: Self-pay

## 2015-03-28 MED ORDER — FUROSEMIDE 20 MG PO TABS: ORAL_TABLET | ORAL | Status: DC

## 2015-03-28 MED ORDER — LISINOPRIL 2.5 MG PO TABS: 2.5000 mg | ORAL_TABLET | Freq: Every day | ORAL | Status: DC

## 2015-03-28 MED ORDER — GEMFIBROZIL 600 MG PO TABS: 600.0000 mg | ORAL_TABLET | Freq: Two times a day (BID) | ORAL | Status: DC

## 2015-03-28 NOTE — Telephone Encounter (Signed)
Damon [name] request refills furosemide,gemfibrozil and lisinopril to [facility] [address]. Annual exam on 02/28/15. Advised done per protocol.

## 2015-03-30 ENCOUNTER — Telehealth: Payer: Self-pay

## 2015-03-30 NOTE — Telephone Encounter (Signed)
[name] said [facility] did not fill the lisinopril; called [facility] and spoke with Melissa and Lisinopril rx is on file;Melissa said maintenance med is put on file and pt has to ask for med; [facility] Sci[facility] will fill now;Lynn notified and voiced understanding.  [name] also said that when pt was in 02/28/15 for annual exam furosemide was supposed to be changed on med list to furosemide 40 mg taking 1 tab po every other day. Furosemide 20 mg was changed to 40 mg by Dr Gabriel Carina. [name] request new rx for furosemide 40 mg sent to [facility] [address]. Lynn request cb on 03/31/15. Please advise.

## 2015-03-31 NOTE — Telephone Encounter (Signed)
Ok to change med list and send new rx as requested.

## 2015-03-31 NOTE — Telephone Encounter (Signed)
I prefer for him to take lasix every other day if he is not having too much swelling.

## 2015-03-31 NOTE — Telephone Encounter (Signed)
Spoke to pts daughter and advised. Informed daughter ok to take 2 20mg  tabs, since he has already picked it up from the pharmacy. Daughter advised to contact office back upon completion of 20mg , for new 40mg  Rx. Med list updated

## 2015-03-31 NOTE — Telephone Encounter (Addendum)
Left v/m requesting cb from [place]. Spoke with Barnett Applebaum RN team lead and she advised to contact Dr Joycie Peek office to verify when furosemide 40 mg taking one every other day was documented. Spoke with Janett Billow, Dr Joycie Peek assistant and was advised per pts chart furosemide 40 mg taking one every other day was noted back 02/2012. Dr Valda Favia office filled last 06/22/2014 for # 16.

## 2015-03-31 NOTE — Telephone Encounter (Signed)
Pt is needing to contact Dr Gabriel Carina, as there are no notes from them, and pts daughter did not notify us of change at [place]. His refills are to be managed by Dr Gabriel Carina, if they made corrections. Rena filled med for pt 03/06. Routed back to [place], per Dr Deborra Medina to contact pt. Pt may double meds if needed, if he has already picked up Rx

## 2015-03-31 NOTE — Telephone Encounter (Signed)
Spoke to pts daughter who states that she was advised that you would manage all of pts medications, and that pt did not have to return to Dr Gabriel Carina. Pt did not update medlist while at [place], but states that it was discussed with Dr Deborra Medina. Daughter is wanting Dr Deborra Medina to decide the dosing and instruction for pt based on his last visit with her. Pt is needing new Rx for whatever Dr Deborra Medina believes is best

## 2015-04-04 ENCOUNTER — Telehealth: Payer: Self-pay | Admitting: Family Medicine

## 2015-04-04 MED ORDER — GLUCOSE BLOOD VI STRP: ORAL_STRIP | Status: DC

## 2015-04-04 MED ORDER — "INSULIN SYRINGE-NEEDLE U-100 30G X 1/2"" 0.5 ML MISC": Status: DC

## 2015-04-04 NOTE — Telephone Encounter (Signed)
Rx sent to requested pharmacy

## 2015-04-04 NOTE — Telephone Encounter (Signed)
Damon [name] called she wanted to know if you could Call in a rx for needles and test strip  Test strips one touch ultra  Needles   50units  Short needles 30 gage 1/2 cc  [facility] [address] Please advise if you can do this

## 2015-04-12 DIAGNOSIS — M7752 Other enthesopathy of left foot: Secondary | ICD-10-CM | POA: Diagnosis not present

## 2015-04-12 DIAGNOSIS — Q6689 Other  specified congenital deformities of feet: Secondary | ICD-10-CM | POA: Diagnosis not present

## 2015-04-22 DIAGNOSIS — R609 Edema, unspecified: Secondary | ICD-10-CM | POA: Diagnosis not present

## 2015-04-22 DIAGNOSIS — E1129 Type 2 diabetes mellitus with other diabetic kidney complication: Secondary | ICD-10-CM | POA: Diagnosis not present

## 2015-04-22 DIAGNOSIS — N183 Chronic kidney disease, stage 3 (moderate): Secondary | ICD-10-CM | POA: Diagnosis not present

## 2015-04-22 DIAGNOSIS — I10 Essential (primary) hypertension: Secondary | ICD-10-CM | POA: Diagnosis not present

## 2015-04-25 ENCOUNTER — Other Ambulatory Visit: Payer: Self-pay

## 2015-04-25 MED ORDER — ERGOCALCIFEROL 1.25 MG (50000 UT) PO CAPS: 50000.0000 [IU] | ORAL_CAPSULE | ORAL | Status: DC

## 2015-04-25 MED ORDER — FUROSEMIDE 40 MG PO TABS: 40.0000 mg | ORAL_TABLET | ORAL | Status: DC

## 2015-04-25 NOTE — Progress Notes (Signed)
Error to open this note see refill request 04/25/15.

## 2015-04-25 NOTE — Telephone Encounter (Signed)
Damon [name] (DPR signed) said pt has been taking furosemide 40 mg every other day; at annual exam 02/2015 furosemide 20 mg taking 1/2 tab every other day was on med list ; Med list updated but pt cannot remember which doctor changed dosage; also request Dr Deborra Medina to start to refill vit d 50 000 units since pt is no longer seeing Dr Gabriel Carina. Damon [name] said FYI to Dr Deborra Medina; pt saw Dr Candiss Norse on 04/22/15 and Dr Candiss Norse is to send a note to Dr Deborra Medina but Dr Candiss Norse added Amlodipine 5 mg taking one daily due to elevated BP.[facility] [address].

## 2015-04-26 DIAGNOSIS — E113592 Type 2 diabetes mellitus with proliferative diabetic retinopathy without macular edema, left eye: Secondary | ICD-10-CM | POA: Diagnosis not present

## 2015-04-27 DIAGNOSIS — H4052X3 Glaucoma secondary to other eye disorders, left eye, severe stage: Secondary | ICD-10-CM | POA: Diagnosis not present

## 2015-04-27 DIAGNOSIS — H211X2 Other vascular disorders of iris and ciliary body, left eye: Secondary | ICD-10-CM | POA: Diagnosis not present

## 2015-04-27 DIAGNOSIS — H35372 Puckering of macula, left eye: Secondary | ICD-10-CM | POA: Diagnosis not present

## 2015-04-27 DIAGNOSIS — E113593 Type 2 diabetes mellitus with proliferative diabetic retinopathy without macular edema, bilateral: Secondary | ICD-10-CM | POA: Diagnosis not present

## 2015-04-27 DIAGNOSIS — H3562 Retinal hemorrhage, left eye: Secondary | ICD-10-CM | POA: Diagnosis not present

## 2015-05-21 DIAGNOSIS — L03119 Cellulitis of unspecified part of limb: Secondary | ICD-10-CM | POA: Diagnosis not present

## 2015-05-21 DIAGNOSIS — L97509 Non-pressure chronic ulcer of other part of unspecified foot with unspecified severity: Secondary | ICD-10-CM | POA: Diagnosis not present

## 2015-05-21 DIAGNOSIS — E13621 Other specified diabetes mellitus with foot ulcer: Secondary | ICD-10-CM | POA: Diagnosis not present

## 2015-05-23 DIAGNOSIS — L02619 Cutaneous abscess of unspecified foot: Secondary | ICD-10-CM | POA: Diagnosis not present

## 2015-05-23 DIAGNOSIS — L03119 Cellulitis of unspecified part of limb: Secondary | ICD-10-CM | POA: Diagnosis not present

## 2015-05-23 DIAGNOSIS — L97521 Non-pressure chronic ulcer of other part of left foot limited to breakdown of skin: Secondary | ICD-10-CM | POA: Diagnosis not present

## 2015-05-23 NOTE — Telephone Encounter (Signed)
[name] called to request refill vit D to [facility] [address]; pt should have available refill and [name] will ck with pharmacy and cb if needed.

## 2015-05-24 DIAGNOSIS — H211X2 Other vascular disorders of iris and ciliary body, left eye: Secondary | ICD-10-CM | POA: Diagnosis not present

## 2015-05-24 DIAGNOSIS — E113592 Type 2 diabetes mellitus with proliferative diabetic retinopathy without macular edema, left eye: Secondary | ICD-10-CM | POA: Diagnosis not present

## 2015-05-24 DIAGNOSIS — H4052X3 Glaucoma secondary to other eye disorders, left eye, severe stage: Secondary | ICD-10-CM | POA: Diagnosis not present

## 2015-05-24 DIAGNOSIS — E113491 Type 2 diabetes mellitus with severe nonproliferative diabetic retinopathy without macular edema, right eye: Secondary | ICD-10-CM | POA: Diagnosis not present

## 2015-05-24 DIAGNOSIS — H35372 Puckering of macula, left eye: Secondary | ICD-10-CM | POA: Diagnosis not present

## 2015-06-06 DIAGNOSIS — L97521 Non-pressure chronic ulcer of other part of left foot limited to breakdown of skin: Secondary | ICD-10-CM | POA: Diagnosis not present

## 2015-06-07 DIAGNOSIS — E113591 Type 2 diabetes mellitus with proliferative diabetic retinopathy without macular edema, right eye: Secondary | ICD-10-CM | POA: Diagnosis not present

## 2015-06-21 ENCOUNTER — Other Ambulatory Visit: Payer: Self-pay

## 2015-06-21 MED ORDER — ERGOCALCIFEROL 1.25 MG (50000 UT) PO CAPS
50000.0000 [IU] | ORAL_CAPSULE | ORAL | Status: DC
Start: 2015-06-21 — End: 2015-07-17

## 2015-06-21 NOTE — Telephone Encounter (Signed)
Lynn left v/m requesting refill Vit D to [facility] [address]. Last refilled # 4 on 04/25/15. Pt last annual exam on 02/28/15.Please advise.

## 2015-06-24 DIAGNOSIS — N183 Chronic kidney disease, stage 3 (moderate): Secondary | ICD-10-CM | POA: Diagnosis not present

## 2015-06-24 DIAGNOSIS — R600 Localized edema: Secondary | ICD-10-CM | POA: Diagnosis not present

## 2015-06-24 DIAGNOSIS — E1129 Type 2 diabetes mellitus with other diabetic kidney complication: Secondary | ICD-10-CM | POA: Diagnosis not present

## 2015-06-24 DIAGNOSIS — I10 Essential (primary) hypertension: Secondary | ICD-10-CM | POA: Diagnosis not present

## 2015-06-29 DIAGNOSIS — E1140 Type 2 diabetes mellitus with diabetic neuropathy, unspecified: Secondary | ICD-10-CM | POA: Diagnosis not present

## 2015-06-29 DIAGNOSIS — L97521 Non-pressure chronic ulcer of other part of left foot limited to breakdown of skin: Secondary | ICD-10-CM | POA: Diagnosis not present

## 2015-06-29 DIAGNOSIS — L851 Acquired keratosis [keratoderma] palmaris et plantaris: Secondary | ICD-10-CM | POA: Diagnosis not present

## 2015-06-29 DIAGNOSIS — Z794 Long term (current) use of insulin: Secondary | ICD-10-CM | POA: Diagnosis not present

## 2015-06-29 DIAGNOSIS — B351 Tinea unguium: Secondary | ICD-10-CM | POA: Diagnosis not present

## 2015-07-10 ENCOUNTER — Other Ambulatory Visit: Payer: Self-pay | Admitting: Family Medicine

## 2015-07-11 NOTE — Telephone Encounter (Signed)
Ok to take OTC vit D 2000 IU daily and recheck Vit D in 3 months.

## 2015-07-11 NOTE — Telephone Encounter (Signed)
Is pt needing to continue high dose VitD? Last lab 01/2015-normal

## 2015-07-17 ENCOUNTER — Other Ambulatory Visit: Payer: Self-pay | Admitting: Family Medicine

## 2015-07-18 NOTE — Telephone Encounter (Signed)
Please see 07/10/15 med refill note; [name] (DPR signed) left v/m requesting what to do about Vit D.

## 2015-07-18 NOTE — Telephone Encounter (Signed)
Lm on Lynn's vm and advised ok to take high dose vitD; approved by Dr Deborra Medina

## 2015-07-18 NOTE — Telephone Encounter (Signed)
Is pt needing to continue high dose VitD? Last lab 01/2015-normal. pls advise

## 2015-07-19 DIAGNOSIS — H4052X3 Glaucoma secondary to other eye disorders, left eye, severe stage: Secondary | ICD-10-CM | POA: Diagnosis not present

## 2015-07-19 DIAGNOSIS — H3562 Retinal hemorrhage, left eye: Secondary | ICD-10-CM | POA: Diagnosis not present

## 2015-07-19 DIAGNOSIS — H211X2 Other vascular disorders of iris and ciliary body, left eye: Secondary | ICD-10-CM | POA: Diagnosis not present

## 2015-07-19 DIAGNOSIS — E113591 Type 2 diabetes mellitus with proliferative diabetic retinopathy without macular edema, right eye: Secondary | ICD-10-CM | POA: Diagnosis not present

## 2015-07-19 DIAGNOSIS — E113592 Type 2 diabetes mellitus with proliferative diabetic retinopathy without macular edema, left eye: Secondary | ICD-10-CM | POA: Diagnosis not present

## 2015-07-29 DIAGNOSIS — M7752 Other enthesopathy of left foot: Secondary | ICD-10-CM | POA: Diagnosis not present

## 2015-07-29 DIAGNOSIS — L97521 Non-pressure chronic ulcer of other part of left foot limited to breakdown of skin: Secondary | ICD-10-CM | POA: Diagnosis not present

## 2015-07-29 DIAGNOSIS — Q6689 Other  specified congenital deformities of feet: Secondary | ICD-10-CM | POA: Diagnosis not present

## 2015-07-30 ENCOUNTER — Other Ambulatory Visit: Payer: Self-pay | Admitting: Family Medicine

## 2015-08-01 NOTE — Telephone Encounter (Signed)
Ok to refill one time only but let's go ahead and schedule Vit D lab.

## 2015-08-01 NOTE — Telephone Encounter (Signed)
Is pt needing to continue high dose VitD? 

## 2015-08-16 DIAGNOSIS — L02619 Cutaneous abscess of unspecified foot: Secondary | ICD-10-CM | POA: Diagnosis not present

## 2015-08-16 DIAGNOSIS — L03119 Cellulitis of unspecified part of limb: Secondary | ICD-10-CM | POA: Diagnosis not present

## 2015-08-16 DIAGNOSIS — L97522 Non-pressure chronic ulcer of other part of left foot with fat layer exposed: Secondary | ICD-10-CM | POA: Diagnosis not present

## 2015-08-16 DIAGNOSIS — M2012 Hallux valgus (acquired), left foot: Secondary | ICD-10-CM | POA: Diagnosis not present

## 2015-08-19 DIAGNOSIS — I10 Essential (primary) hypertension: Secondary | ICD-10-CM | POA: Diagnosis not present

## 2015-08-19 DIAGNOSIS — E785 Hyperlipidemia, unspecified: Secondary | ICD-10-CM | POA: Diagnosis not present

## 2015-08-19 DIAGNOSIS — E119 Type 2 diabetes mellitus without complications: Secondary | ICD-10-CM | POA: Diagnosis not present

## 2015-08-19 DIAGNOSIS — L97529 Non-pressure chronic ulcer of other part of left foot with unspecified severity: Secondary | ICD-10-CM | POA: Diagnosis not present

## 2015-08-31 DIAGNOSIS — L03119 Cellulitis of unspecified part of limb: Secondary | ICD-10-CM | POA: Diagnosis not present

## 2015-08-31 DIAGNOSIS — L97522 Non-pressure chronic ulcer of other part of left foot with fat layer exposed: Secondary | ICD-10-CM | POA: Diagnosis not present

## 2015-08-31 DIAGNOSIS — L02619 Cutaneous abscess of unspecified foot: Secondary | ICD-10-CM | POA: Diagnosis not present

## 2015-09-11 ENCOUNTER — Other Ambulatory Visit: Payer: Self-pay | Admitting: Family Medicine

## 2015-09-21 DIAGNOSIS — L97522 Non-pressure chronic ulcer of other part of left foot with fat layer exposed: Secondary | ICD-10-CM | POA: Diagnosis not present

## 2015-09-28 ENCOUNTER — Telehealth: Payer: Self-pay | Admitting: Family Medicine

## 2015-09-28 NOTE — Telephone Encounter (Signed)
Daughter [name] left message on triage phone requesting a call from PCP regarding patient. Called to get additional information, daughter states pt is getting "aggressive and mean".  Patient last seen 02/2015 for medicare wellness.  Offered appointment 09/29/15 8 am, daughter declined, just wants call back from PCP [phone]

## 2015-10-05 NOTE — Telephone Encounter (Signed)
Please call pt's daughter to get more information.

## 2015-10-05 NOTE — Telephone Encounter (Signed)
Spoke to pts daughter who is wanting to speak directly with PCP as stated below

## 2015-10-05 NOTE — Telephone Encounter (Signed)
Spoke with pt's daughter.  Having more mood swings and memory loss.  Advised that she should bring him in for evaluation.  She agreed to this.

## 2015-10-08 ENCOUNTER — Other Ambulatory Visit: Payer: Self-pay | Admitting: Family Medicine

## 2015-10-10 NOTE — Telephone Encounter (Signed)
Is pt needing to continue high dose VitD?

## 2015-10-11 ENCOUNTER — Encounter: Payer: Self-pay | Admitting: Family Medicine

## 2015-10-11 ENCOUNTER — Ambulatory Visit (INDEPENDENT_AMBULATORY_CARE_PROVIDER_SITE_OTHER): Payer: PPO | Admitting: Family Medicine

## 2015-10-11 VITALS — BP 142/72 | HR 48 | Temp 97.8°F | Wt 223.5 lb

## 2015-10-11 DIAGNOSIS — H211X2 Other vascular disorders of iris and ciliary body, left eye: Secondary | ICD-10-CM | POA: Diagnosis not present

## 2015-10-11 DIAGNOSIS — F39 Unspecified mood [affective] disorder: Secondary | ICD-10-CM

## 2015-10-11 DIAGNOSIS — E113592 Type 2 diabetes mellitus with proliferative diabetic retinopathy without macular edema, left eye: Secondary | ICD-10-CM | POA: Diagnosis not present

## 2015-10-11 DIAGNOSIS — E113591 Type 2 diabetes mellitus with proliferative diabetic retinopathy without macular edema, right eye: Secondary | ICD-10-CM | POA: Diagnosis not present

## 2015-10-11 DIAGNOSIS — R4586 Emotional lability: Secondary | ICD-10-CM | POA: Insufficient documentation

## 2015-10-11 DIAGNOSIS — H4052X3 Glaucoma secondary to other eye disorders, left eye, severe stage: Secondary | ICD-10-CM | POA: Diagnosis not present

## 2015-10-11 LAB — COMPREHENSIVE METABOLIC PANEL
ALT: 16 U/L (ref 0–53)
AST: 15 U/L (ref 0–37)
Albumin: 4.2 g/dL (ref 3.5–5.2)
Alkaline Phosphatase: 104 U/L (ref 39–117)
BUN: 25 mg/dL — ABNORMAL HIGH (ref 6–23)
CO2: 28 mEq/L (ref 19–32)
Calcium: 9.2 mg/dL (ref 8.4–10.5)
Chloride: 103 mEq/L (ref 96–112)
Creatinine, Ser: 1.47 mg/dL (ref 0.40–1.50)
GFR: 48.94 mL/min — ABNORMAL LOW (ref >60.00)
Glucose, Bld: 116 mg/dL — ABNORMAL HIGH (ref 70–99)
Potassium: 4.2 mEq/L (ref 3.5–5.1)
Sodium: 138 mEq/L (ref 135–145)
Total Bilirubin: 0.7 mg/dL (ref 0.2–1.2)
Total Protein: 7 g/dL (ref 6.0–8.3)

## 2015-10-11 LAB — CBC WITH DIFFERENTIAL/PLATELET
Basophils Absolute: 0 10*3/uL (ref 0.0–0.1)
Basophils Relative: 0.5 % (ref 0.0–3.0)
Eosinophils Absolute: 0.1 10*3/uL (ref 0.0–0.7)
Eosinophils Relative: 1.1 % (ref 0.0–5.0)
HCT: 47.5 % (ref 39.0–52.0)
Hemoglobin: 16.5 g/dL (ref 13.0–17.0)
Lymphocytes Relative: 32 % (ref 12.0–46.0)
Lymphs Abs: 2.5 10*3/uL (ref 0.7–4.0)
MCHC: 34.7 g/dL (ref 30.0–36.0)
MCV: 86.2 fl (ref 78.0–100.0)
Monocytes Absolute: 0.8 10*3/uL (ref 0.1–1.0)
Monocytes Relative: 9.5 % (ref 3.0–12.0)
Neutro Abs: 4.5 10*3/uL (ref 1.4–7.7)
Neutrophils Relative %: 56.9 % (ref 43.0–77.0)
Platelets: 169 10*3/uL (ref 150.0–400.0)
RBC: 5.51 Mil/uL (ref 4.22–5.81)
RDW: 14.1 % (ref 11.5–15.5)
WBC: 7.9 10*3/uL (ref 4.0–10.5)

## 2015-10-11 LAB — VITAMIN B12: Vitamin B-12: 213 pg/mL (ref 211–911)

## 2015-10-11 LAB — HEMOGLOBIN A1C: Hgb A1c MFr Bld: 9.5 % — ABNORMAL HIGH (ref 4.6–6.5)

## 2015-10-11 LAB — TSH: TSH: 3.87 u[IU]/mL (ref 0.35–4.50)

## 2015-10-11 LAB — VITAMIN D 25 HYDROXY (VIT D DEFICIENCY, FRACTURES): VITD: 35.43 ng/mL (ref 30.00–100.00)

## 2015-10-11 NOTE — Assessment & Plan Note (Signed)
>  25 minutes spent in face to face time with patient, >50% spent in counselling or coordination of care Pt does not feel like he is depressed, anxious or having mood swings. Discussed with his daughter today.  Will check labs and UA today to rule out reversible causes. Rx not indicated at this time. The patient indicates understanding of these issues and agrees with the plan.

## 2015-10-11 NOTE — Progress Notes (Signed)
Subjective:   Patient ID: Damon [name], male    DOB: 12-14-1933, 82 y.o.   MRN: [id number]  Damon [name] is a pleasant 82 y.o. year old male who presents to clinic today with his daughter for mood swings  on 10/11/2015  HPI:  Mood swings- daughter brings him in today because she feels at times he is "mean and hateful" and the next day he's laughing and jovial.  Ms. Larmon does not think he has been having mood swings.  He denies feeling depressed or anxious. He has not harmed himself or anyone else.  His daughter thinks he is "snappy." Not violent.  Sleeping well.  Appetite good.  Wt Readings from Last 3 Encounters:  10/11/15 223 lb 8 oz (101.4 kg)  03/14/15 220 lb 8 oz (100 kg)  03/06/15 220 lb (99.8 kg)   Current Outpatient Prescriptions on File Prior to Visit  Medication Sig Dispense Refill  . aspirin 81 MG tablet Take 81 mg by mouth daily.      . furosemide (LASIX) 40 MG tablet Take 1 tablet (40 mg total) by mouth every other day. 45 tablet 1  . gemfibrozil (LOPID) 600 MG tablet Take 1 tablet (600 mg total) by mouth 2 (two) times daily before a meal. 180 tablet 3  . glucose blood test strip Use as instructed 100 each 12  . insulin NPH-regular Human (NOVOLIN 70/30) (70-30) 100 UNIT/ML injection Inject 45 Units into the skin 2 (two) times daily with a meal.     . Insulin Syringe-Needle U-100 (B-D INS SYRINGE 0.5CC/30GX1/2") 30G X 1/2" 0.5 ML MISC Use to inject insulin twice a day. 100 each 1  . lisinopril (PRINIVIL,ZESTRIL) 2.5 MG tablet Take 1 tablet (2.5 mg total) by mouth daily. 90 tablet 3  . potassium chloride SA (K-DUR,KLOR-CON) 20 MEQ tablet Take 20 mEq by mouth every other day. Reported on 03/14/2015    . tamsulosin (FLOMAX) 0.4 MG CAPS capsule Take 0.4 mg by mouth daily.    . Vitamin D, Ergocalciferol, (DRISDOL) 50000 units CAPS capsule TAKE ONE CAPSULE BY MOUTH ONCE A WEEK 4 capsule 0   No current facility-administered medications on file prior to visit.      Allergies  Allergen Reactions  . Penicillins Other (See Comments)    Reaction as a child    Past Medical History:  Diagnosis Date  . Arrhythmia    Baseline bradycardia  . Bradycardia   . CHF (congestive heart failure) ([place])   . Chronic kidney disease    BAseline creatinine 1.4 to 1.5  . Diabetes mellitus   . Hard of hearing   . Hypertension     Past Surgical History:  Procedure Laterality Date  . COLONOSCOPY  01/01/2012   Procedure: COLONOSCOPY;  Surgeon: [name], MD;  Location: [facility] ENDOSCOPY;  Service: Endoscopy;  Laterality: N/A;  . PARATHYROIDECTOMY    . TONSILLECTOMY      Family History  Problem Relation Age of Onset  . Diabetes type II Mother   . Hypertension Mother   . Diabetes type II Father   . Hypertension Father     Social History   Social History  . Marital status: Widowed    Spouse name: N/A  . Number of children: N/A  . Years of education: N/A   Occupational History  . Not on file.   Social History Main Topics  . Smoking status: Never Smoker  . Smokeless tobacco: Former Systems developer    Types: Loss adjuster, chartered  Comment: chewing more than 40 years/chew 1PPD  . Alcohol use No  . Drug use: No  . Sexual activity: Not on file   Other Topics Concern  . Not on file   Social History Narrative   Would desire CPR   The PMH, PSH, Social History, Family History, Medications, and allergies have been reviewed in [facility], and have been updated if relevant.    Review of Systems  Psychiatric/Behavioral: Positive for agitation. Negative for confusion, decreased concentration, dysphoric mood, hallucinations, self-injury, sleep disturbance and suicidal ideas. The patient is not nervous/anxious and is not hyperactive.   All other systems reviewed and are negative.      Objective:    BP (!) 142/72   Pulse (!) 48   Temp 97.8 F (36.6 C) (Oral)   Wt 223 lb 8 oz (101.4 kg)   SpO2 95%   BMI 33.98 kg/m    Physical Exam  Constitutional: He is oriented  to person, place, and time. He appears well-developed and well-nourished. No distress.  HENT:  Head: Normocephalic.  Eyes: Conjunctivae are normal.  Cardiovascular: Normal rate.   Pulmonary/Chest: Effort normal.  Musculoskeletal: Normal range of motion.  Neurological: He is alert and oriented to person, place, and time. No cranial nerve deficit.  Skin: Skin is warm and dry. He is not diaphoretic.  Psychiatric: He has a normal mood and affect. His behavior is normal. Judgment and thought content normal.  Nursing note and vitals reviewed.         Assessment & Plan:   Mood swings ([place]) - Plan: Comprehensive metabolic panel, CBC with Differential/Platelet, TSH, Vitamin B12, Vitamin D, 25-hydroxy, Hemoglobin A1c No Follow-up on file.

## 2015-10-11 NOTE — Progress Notes (Signed)
Pre visit review using our clinic review tool, if applicable. No additional management support is needed unless otherwise documented below in the visit note. 

## 2015-10-17 ENCOUNTER — Telehealth: Payer: Self-pay | Admitting: Family Medicine

## 2015-10-17 NOTE — Telephone Encounter (Signed)
Refer to lab notes 

## 2015-10-17 NOTE — Telephone Encounter (Signed)
Damon [name] called stating she was expecting a call from [place] she spoke with her on Friday, regarding pt's labs

## 2015-10-19 DIAGNOSIS — L97422 Non-pressure chronic ulcer of left heel and midfoot with fat layer exposed: Secondary | ICD-10-CM | POA: Diagnosis not present

## 2015-10-19 DIAGNOSIS — E113293 Type 2 diabetes mellitus with mild nonproliferative diabetic retinopathy without macular edema, bilateral: Secondary | ICD-10-CM | POA: Diagnosis not present

## 2015-10-19 DIAGNOSIS — L851 Acquired keratosis [keratoderma] palmaris et plantaris: Secondary | ICD-10-CM | POA: Diagnosis not present

## 2015-10-19 DIAGNOSIS — E1122 Type 2 diabetes mellitus with diabetic chronic kidney disease: Secondary | ICD-10-CM | POA: Diagnosis not present

## 2015-10-19 DIAGNOSIS — Z794 Long term (current) use of insulin: Secondary | ICD-10-CM | POA: Diagnosis not present

## 2015-10-19 DIAGNOSIS — N183 Chronic kidney disease, stage 3 (moderate): Secondary | ICD-10-CM | POA: Diagnosis not present

## 2015-10-19 DIAGNOSIS — E1165 Type 2 diabetes mellitus with hyperglycemia: Secondary | ICD-10-CM | POA: Diagnosis not present

## 2015-10-19 DIAGNOSIS — E11621 Type 2 diabetes mellitus with foot ulcer: Secondary | ICD-10-CM | POA: Diagnosis not present

## 2015-10-19 DIAGNOSIS — B351 Tinea unguium: Secondary | ICD-10-CM | POA: Diagnosis not present

## 2015-10-19 DIAGNOSIS — L97522 Non-pressure chronic ulcer of other part of left foot with fat layer exposed: Secondary | ICD-10-CM | POA: Diagnosis not present

## 2015-10-19 DIAGNOSIS — E1140 Type 2 diabetes mellitus with diabetic neuropathy, unspecified: Secondary | ICD-10-CM | POA: Diagnosis not present

## 2015-10-28 DIAGNOSIS — R609 Edema, unspecified: Secondary | ICD-10-CM | POA: Diagnosis not present

## 2015-10-28 DIAGNOSIS — E1129 Type 2 diabetes mellitus with other diabetic kidney complication: Secondary | ICD-10-CM | POA: Diagnosis not present

## 2015-10-28 DIAGNOSIS — I10 Essential (primary) hypertension: Secondary | ICD-10-CM | POA: Diagnosis not present

## 2015-10-28 DIAGNOSIS — N183 Chronic kidney disease, stage 3 (moderate): Secondary | ICD-10-CM | POA: Diagnosis not present

## 2015-10-30 ENCOUNTER — Other Ambulatory Visit: Payer: Self-pay | Admitting: Family Medicine

## 2015-11-07 DIAGNOSIS — R609 Edema, unspecified: Secondary | ICD-10-CM | POA: Diagnosis not present

## 2015-11-07 DIAGNOSIS — N183 Chronic kidney disease, stage 3 (moderate): Secondary | ICD-10-CM | POA: Diagnosis not present

## 2015-11-07 DIAGNOSIS — I10 Essential (primary) hypertension: Secondary | ICD-10-CM | POA: Diagnosis not present

## 2015-11-07 DIAGNOSIS — E1129 Type 2 diabetes mellitus with other diabetic kidney complication: Secondary | ICD-10-CM | POA: Diagnosis not present

## 2015-11-16 DIAGNOSIS — L97522 Non-pressure chronic ulcer of other part of left foot with fat layer exposed: Secondary | ICD-10-CM | POA: Diagnosis not present

## 2015-11-19 ENCOUNTER — Other Ambulatory Visit: Payer: Self-pay | Admitting: Family Medicine

## 2015-12-14 DIAGNOSIS — L97522 Non-pressure chronic ulcer of other part of left foot with fat layer exposed: Secondary | ICD-10-CM | POA: Diagnosis not present

## 2015-12-25 ENCOUNTER — Other Ambulatory Visit: Payer: Self-pay | Admitting: Family Medicine

## 2016-01-11 DIAGNOSIS — Z794 Long term (current) use of insulin: Secondary | ICD-10-CM | POA: Diagnosis not present

## 2016-01-11 DIAGNOSIS — B351 Tinea unguium: Secondary | ICD-10-CM | POA: Diagnosis not present

## 2016-01-11 DIAGNOSIS — L97521 Non-pressure chronic ulcer of other part of left foot limited to breakdown of skin: Secondary | ICD-10-CM | POA: Diagnosis not present

## 2016-01-11 DIAGNOSIS — E1140 Type 2 diabetes mellitus with diabetic neuropathy, unspecified: Secondary | ICD-10-CM | POA: Diagnosis not present

## 2016-01-11 DIAGNOSIS — E1165 Type 2 diabetes mellitus with hyperglycemia: Secondary | ICD-10-CM | POA: Diagnosis not present

## 2016-01-11 DIAGNOSIS — L851 Acquired keratosis [keratoderma] palmaris et plantaris: Secondary | ICD-10-CM | POA: Diagnosis not present

## 2016-01-14 ENCOUNTER — Other Ambulatory Visit: Payer: Self-pay | Admitting: Family Medicine

## 2016-01-18 DIAGNOSIS — E1165 Type 2 diabetes mellitus with hyperglycemia: Secondary | ICD-10-CM | POA: Diagnosis not present

## 2016-01-18 DIAGNOSIS — L97422 Non-pressure chronic ulcer of left heel and midfoot with fat layer exposed: Secondary | ICD-10-CM | POA: Diagnosis not present

## 2016-01-18 DIAGNOSIS — Z794 Long term (current) use of insulin: Secondary | ICD-10-CM | POA: Diagnosis not present

## 2016-01-18 DIAGNOSIS — E1122 Type 2 diabetes mellitus with diabetic chronic kidney disease: Secondary | ICD-10-CM | POA: Diagnosis not present

## 2016-01-18 DIAGNOSIS — E11621 Type 2 diabetes mellitus with foot ulcer: Secondary | ICD-10-CM | POA: Diagnosis not present

## 2016-01-18 DIAGNOSIS — N183 Chronic kidney disease, stage 3 (moderate): Secondary | ICD-10-CM | POA: Diagnosis not present

## 2016-01-18 DIAGNOSIS — E113293 Type 2 diabetes mellitus with mild nonproliferative diabetic retinopathy without macular edema, bilateral: Secondary | ICD-10-CM | POA: Diagnosis not present

## 2016-02-06 DIAGNOSIS — I10 Essential (primary) hypertension: Secondary | ICD-10-CM | POA: Diagnosis not present

## 2016-02-06 DIAGNOSIS — E1129 Type 2 diabetes mellitus with other diabetic kidney complication: Secondary | ICD-10-CM | POA: Diagnosis not present

## 2016-02-06 DIAGNOSIS — R609 Edema, unspecified: Secondary | ICD-10-CM | POA: Diagnosis not present

## 2016-02-06 DIAGNOSIS — N183 Chronic kidney disease, stage 3 (moderate): Secondary | ICD-10-CM | POA: Diagnosis not present

## 2016-02-13 DIAGNOSIS — L97521 Non-pressure chronic ulcer of other part of left foot limited to breakdown of skin: Secondary | ICD-10-CM | POA: Diagnosis not present

## 2016-02-18 ENCOUNTER — Other Ambulatory Visit: Payer: Self-pay | Admitting: Family Medicine

## 2016-02-20 NOTE — Telephone Encounter (Signed)
Is pt needing to continue high dose?

## 2016-03-18 ENCOUNTER — Other Ambulatory Visit: Payer: Self-pay | Admitting: Family Medicine

## 2016-03-26 DIAGNOSIS — L851 Acquired keratosis [keratoderma] palmaris et plantaris: Secondary | ICD-10-CM | POA: Diagnosis not present

## 2016-03-26 DIAGNOSIS — B351 Tinea unguium: Secondary | ICD-10-CM | POA: Diagnosis not present

## 2016-03-26 DIAGNOSIS — E1140 Type 2 diabetes mellitus with diabetic neuropathy, unspecified: Secondary | ICD-10-CM | POA: Diagnosis not present

## 2016-03-26 DIAGNOSIS — Z794 Long term (current) use of insulin: Secondary | ICD-10-CM | POA: Diagnosis not present

## 2016-04-08 ENCOUNTER — Other Ambulatory Visit: Payer: Self-pay | Admitting: Family Medicine

## 2016-04-11 DIAGNOSIS — E1165 Type 2 diabetes mellitus with hyperglycemia: Secondary | ICD-10-CM | POA: Diagnosis not present

## 2016-04-11 DIAGNOSIS — Z794 Long term (current) use of insulin: Secondary | ICD-10-CM | POA: Diagnosis not present

## 2016-04-18 DIAGNOSIS — Z794 Long term (current) use of insulin: Secondary | ICD-10-CM | POA: Diagnosis not present

## 2016-04-18 DIAGNOSIS — N183 Chronic kidney disease, stage 3 (moderate): Secondary | ICD-10-CM | POA: Diagnosis not present

## 2016-04-18 DIAGNOSIS — E1122 Type 2 diabetes mellitus with diabetic chronic kidney disease: Secondary | ICD-10-CM | POA: Diagnosis not present

## 2016-04-18 DIAGNOSIS — E113293 Type 2 diabetes mellitus with mild nonproliferative diabetic retinopathy without macular edema, bilateral: Secondary | ICD-10-CM | POA: Diagnosis not present

## 2016-05-03 DIAGNOSIS — R609 Edema, unspecified: Secondary | ICD-10-CM | POA: Diagnosis not present

## 2016-05-03 DIAGNOSIS — E1129 Type 2 diabetes mellitus with other diabetic kidney complication: Secondary | ICD-10-CM | POA: Diagnosis not present

## 2016-05-03 DIAGNOSIS — I10 Essential (primary) hypertension: Secondary | ICD-10-CM | POA: Diagnosis not present

## 2016-05-03 DIAGNOSIS — N183 Chronic kidney disease, stage 3 (moderate): Secondary | ICD-10-CM | POA: Diagnosis not present

## 2016-05-03 LAB — CBC AND DIFFERENTIAL
Hemoglobin: 16.3 (ref 13.5–17.5)
Platelets: 170 (ref 150–399)

## 2016-05-03 LAB — BASIC METABOLIC PANEL
BUN: 30 — AB (ref 4–21)
Creatinine: 1.6 — AB (ref 0.6–1.3)
Glucose: 206
Potassium: 4.7 (ref 3.4–5.3)
Sodium: 139 (ref 137–147)

## 2016-05-03 LAB — HEMOGLOBIN A1C: Hemoglobin A1C: 7.6

## 2016-05-07 DIAGNOSIS — L97521 Non-pressure chronic ulcer of other part of left foot limited to breakdown of skin: Secondary | ICD-10-CM | POA: Diagnosis not present

## 2016-05-15 DIAGNOSIS — H401131 Primary open-angle glaucoma, bilateral, mild stage: Secondary | ICD-10-CM | POA: Diagnosis not present

## 2016-05-29 DIAGNOSIS — H401131 Primary open-angle glaucoma, bilateral, mild stage: Secondary | ICD-10-CM | POA: Diagnosis not present

## 2016-05-29 DIAGNOSIS — E113393 Type 2 diabetes mellitus with moderate nonproliferative diabetic retinopathy without macular edema, bilateral: Secondary | ICD-10-CM | POA: Diagnosis not present

## 2016-06-04 DIAGNOSIS — E161 Other hypoglycemia: Secondary | ICD-10-CM | POA: Diagnosis not present

## 2016-06-04 DIAGNOSIS — R7309 Other abnormal glucose: Secondary | ICD-10-CM | POA: Diagnosis not present

## 2016-06-19 DIAGNOSIS — E1140 Type 2 diabetes mellitus with diabetic neuropathy, unspecified: Secondary | ICD-10-CM | POA: Diagnosis not present

## 2016-06-19 DIAGNOSIS — Z794 Long term (current) use of insulin: Secondary | ICD-10-CM | POA: Diagnosis not present

## 2016-06-19 DIAGNOSIS — L851 Acquired keratosis [keratoderma] palmaris et plantaris: Secondary | ICD-10-CM | POA: Diagnosis not present

## 2016-06-19 DIAGNOSIS — B351 Tinea unguium: Secondary | ICD-10-CM | POA: Diagnosis not present

## 2016-07-01 ENCOUNTER — Other Ambulatory Visit: Payer: Self-pay | Admitting: Family Medicine

## 2016-07-02 NOTE — Telephone Encounter (Signed)
Do you want him to keep taking this medication?

## 2016-07-11 DIAGNOSIS — E1122 Type 2 diabetes mellitus with diabetic chronic kidney disease: Secondary | ICD-10-CM | POA: Diagnosis not present

## 2016-07-11 DIAGNOSIS — N183 Chronic kidney disease, stage 3 (moderate): Secondary | ICD-10-CM | POA: Diagnosis not present

## 2016-07-11 DIAGNOSIS — Z794 Long term (current) use of insulin: Secondary | ICD-10-CM | POA: Diagnosis not present

## 2016-07-11 LAB — HEMOGLOBIN A1C: Hemoglobin A1C: 7.1

## 2016-07-15 ENCOUNTER — Other Ambulatory Visit: Payer: Self-pay | Admitting: Family Medicine

## 2016-07-20 DIAGNOSIS — E11649 Type 2 diabetes mellitus with hypoglycemia without coma: Secondary | ICD-10-CM | POA: Diagnosis not present

## 2016-07-20 DIAGNOSIS — Z794 Long term (current) use of insulin: Secondary | ICD-10-CM | POA: Diagnosis not present

## 2016-07-20 DIAGNOSIS — N183 Chronic kidney disease, stage 3 (moderate): Secondary | ICD-10-CM | POA: Diagnosis not present

## 2016-07-20 DIAGNOSIS — E113293 Type 2 diabetes mellitus with mild nonproliferative diabetic retinopathy without macular edema, bilateral: Secondary | ICD-10-CM | POA: Diagnosis not present

## 2016-07-20 DIAGNOSIS — E1122 Type 2 diabetes mellitus with diabetic chronic kidney disease: Secondary | ICD-10-CM | POA: Diagnosis not present

## 2016-07-24 ENCOUNTER — Telehealth: Payer: Self-pay | Admitting: Family Medicine

## 2016-07-24 NOTE — Telephone Encounter (Signed)
Left pt message asking to call Ebony Hail back directly at [phone] to schedule AWV + labs with Katha Cabal and CPE with PCP.  NOTE* Last AWV 02/28/15

## 2016-07-31 DIAGNOSIS — M2012 Hallux valgus (acquired), left foot: Secondary | ICD-10-CM | POA: Diagnosis not present

## 2016-07-31 DIAGNOSIS — L97522 Non-pressure chronic ulcer of other part of left foot with fat layer exposed: Secondary | ICD-10-CM | POA: Diagnosis not present

## 2016-07-31 DIAGNOSIS — Q6689 Other  specified congenital deformities of feet: Secondary | ICD-10-CM | POA: Diagnosis not present

## 2016-07-31 DIAGNOSIS — L851 Acquired keratosis [keratoderma] palmaris et plantaris: Secondary | ICD-10-CM | POA: Diagnosis not present

## 2016-08-07 ENCOUNTER — Ambulatory Visit (INDEPENDENT_AMBULATORY_CARE_PROVIDER_SITE_OTHER): Payer: Self-pay

## 2016-08-07 ENCOUNTER — Other Ambulatory Visit: Payer: Self-pay | Admitting: Family Medicine

## 2016-08-07 VITALS — BP 118/58 | HR 56 | Ht 64.5 in | Wt 225.5 lb

## 2016-08-07 DIAGNOSIS — E1165 Type 2 diabetes mellitus with hyperglycemia: Principal | ICD-10-CM

## 2016-08-07 DIAGNOSIS — Z0000 Encounter for general adult medical examination without abnormal findings: Secondary | ICD-10-CM | POA: Insufficient documentation

## 2016-08-07 DIAGNOSIS — E1121 Type 2 diabetes mellitus with diabetic nephropathy: Secondary | ICD-10-CM

## 2016-08-07 DIAGNOSIS — IMO0002 Reserved for concepts with insufficient information to code with codable children: Secondary | ICD-10-CM

## 2016-08-07 NOTE — Patient Instructions (Signed)
Damon [name] , Thank you for taking time to come for your Medicare Wellness Visit. I appreciate your ongoing commitment to your health goals. Please review the following plan we discussed and let me know if I can assist you in the future.   These are the goals we discussed: Goals    . Increase water intake          Starting 08/07/2016, I will attempt to drink 4 glasses of water daily.       This is a list of the screening recommended for you and due dates:  Health Maintenance  Topic Date Due  . Eye exam for diabetics  09/11/2015  . Complete foot exam   02/28/2016  . Hemoglobin A1C  04/09/2016  . Flu Shot  08/22/2016  . Tetanus Vaccine  02/27/2025  . Pneumonia vaccines  Completed   Preventive Care for Adults  A healthy lifestyle and preventive care can promote health and wellness. Preventive health guidelines for adults include the following key practices.  . A routine yearly physical is a good way to check with your health care provider about your health and preventive screening. It is a chance to share any concerns and updates on your health and to receive a thorough exam.  . Visit your dentist for a routine exam and preventive care every 6 months. Brush your teeth twice a day and floss once a day. Good oral hygiene prevents tooth decay and gum disease.  . The frequency of eye exams is based on your age, health, family medical history, use  of contact lenses, and other factors. Follow your health care provider's ecommendations for frequency of eye exams.  . Eat a healthy diet. Foods like vegetables, fruits, whole grains, low-fat dairy products, and lean protein foods contain the nutrients you need without too many calories. Decrease your intake of foods high in solid fats, added sugars, and salt. Eat the right amount of calories for you. Get information about a proper diet from your health care provider, if necessary.  . Regular physical exercise is one of the most important things  you can do for your health. Most adults should get at least 150 minutes of moderate-intensity exercise (any activity that increases your heart rate and causes you to sweat) each week. In addition, most adults need muscle-strengthening exercises on 2 or more days a week.  Silver Sneakers may be a benefit available to you. To determine eligibility, you may visit the website: www.silversneakers.com or contact program at 619-621-9993 Mon-Fri between 8AM-8PM.   . Maintain a healthy weight. The body mass index (BMI) is a screening tool to identify possible weight problems. It provides an estimate of body fat based on height and weight. Your health care provider can find your BMI and can help you achieve or maintain a healthy weight.   For adults 20 years and older: ? A BMI below 18.5 is considered underweight. ? A BMI of 18.5 to 24.9 is normal. ? A BMI of 25 to 29.9 is considered overweight. ? A BMI of 30 and above is considered obese.   . Maintain normal blood lipids and cholesterol levels by exercising and minimizing your intake of saturated fat. Eat a balanced diet with plenty of fruit and vegetables. Blood tests for lipids and cholesterol should begin at age 15 and be repeated every 5 years. If your lipid or cholesterol levels are high, you are over 50, or you are at high risk for heart disease, you may need your  cholesterol levels checked more frequently. Ongoing high lipid and cholesterol levels should be treated with medicines if diet and exercise are not working.  . If you smoke, find out from your health care provider how to quit. If you do not use tobacco, please do not start.  . If you choose to drink alcohol, please do not consume more than 2 drinks per day. One drink is considered to be 12 ounces (355 mL) of beer, 5 ounces (148 mL) of wine, or 1.5 ounces (44 mL) of liquor.  . If you are 48-56 years old, ask your health care provider if you should take aspirin to prevent strokes.  . Use  sunscreen. Apply sunscreen liberally and repeatedly throughout the day. You should seek shade when your shadow is shorter than you. Protect yourself by wearing long sleeves, pants, a wide-brimmed hat, and sunglasses year round, whenever you are outdoors.  . Once a month, do a whole body skin exam, using a mirror to look at the skin on your back. Tell your health care provider of new moles, moles that have irregular borders, moles that are larger than a pencil eraser, or moles that have changed in shape or color.

## 2016-08-07 NOTE — Progress Notes (Signed)
Medical screening examination/treatment/procedure(s) were performed by registered nurse and as supervising non-physician practitioner, I was immediately available for consultation/collaboration.   BAITY, REGINA, NP  

## 2016-08-07 NOTE — Progress Notes (Addendum)
PCP notes:   Health maintenance: Diabetic eye exam- has upcoming appointment at the end of this month.   Foot exam- Follows w/ podiatry at [facility].   Abnormal screenings: Hearing-failed.   Vision-failed. Has upcoming appointment.   Mini-cog-abnormal clock drawing. Disoriented to month, date, day of the week, year, and president-'I don't keep up with that.'    Patient concerns: None   Nurse concerns:  1)Home safety-he lives alone and is at risk for falls due to impaired mobility, balance, and vision.  2) Significant amount of cerumen noted on hearing screener probe after exam. ? Impaction.   Next PCP appt: 08/13/2016 @ 3:15pm

## 2016-08-07 NOTE — Progress Notes (Addendum)
Subjective:   Damon [name] is a 83 y.o. male who presents for Medicare Annual/Subsequent preventive examination. He is here today with his friend, Pamala Hurry.  Review of Systems:  No ROS.  Medicare Wellness Visit. Additional risk factors are reflected in the social history.  Cardiac Risk Factors include: advanced age (>42men, >54 women);diabetes mellitus;dyslipidemia;hypertension;male gender;obesity (BMI >30kg/m2);sedentary lifestyle     Objective:    Vitals: BP (!) 118/58   Pulse (!) 56   Ht 5' 4.5" (1.638 m)   Wt 225 lb 8 oz (102.3 kg)   SpO2 96%   BMI 38.11 kg/m   Body mass index is 38.11 kg/m.  Tobacco History  Smoking Status  . Never Smoker  Smokeless Tobacco  . Former Systems developer  . Types: Chew    Comment: chewing more than 40 years/chew 1PPD     Counseling given: Not Answered   Past Medical History:  Diagnosis Date  . Arrhythmia    Baseline bradycardia  . Bradycardia   . CHF (congestive heart failure) ([place])   . Chronic kidney disease    BAseline creatinine 1.4 to 1.5  . Diabetes mellitus   . Hard of hearing   . Hypertension    Past Surgical History:  Procedure Laterality Date  . COLONOSCOPY  01/01/2012   Procedure: COLONOSCOPY;  Surgeon: [name], MD;  Location: [facility] ENDOSCOPY;  Service: Endoscopy;  Laterality: N/A;  . PARATHYROIDECTOMY    . TONSILLECTOMY     Family History  Problem Relation Age of Onset  . Diabetes type II Mother   . Hypertension Mother   . Diabetes type II Father   . Hypertension Father    History  Sexual Activity  . Sexual activity: Not on file    Outpatient Encounter Prescriptions as of 08/07/2016  Medication Sig  . aspirin 81 MG tablet Take 81 mg by mouth daily.    Marland Kitchen gemfibrozil (LOPID) 600 MG tablet TAKE 1 TABLET BY MOUTH TWICE DAILY BEFORE MEAL(S)  . glucose blood test strip Use as instructed  . insulin NPH-regular Human (NOVOLIN 70/30) (70-30) 100 UNIT/ML injection Inject 28 units before breakfast and 38 units  before supper  . Insulin Syringe-Needle U-100 (B-D INS SYRINGE 0.5CC/30GX1/2") 30G X 1/2" 0.5 ML MISC Use to inject insulin twice a day.  . lisinopril (PRINIVIL,ZESTRIL) 2.5 MG tablet Take 1 tablet (2.5 mg total) by mouth daily.  . Omega-3 Fatty Acids (FISH OIL PO) Take 1 capsule by mouth daily.  . tamsulosin (FLOMAX) 0.4 MG CAPS capsule Take 0.4 mg by mouth daily.  . Vitamin D, Ergocalciferol, (DRISDOL) 50000 units CAPS capsule TAKE 1 CAPSULE BY MOUTH ONCE A WEEK  . amLODipine (NORVASC) 5 MG tablet Take 1 tablet by mouth 2 (two) times daily.  . furosemide (LASIX) 40 MG tablet TAKE ONE TABLET BY MOUTH EVERY OTHER DAY  . potassium chloride SA (K-DUR,KLOR-CON) 20 MEQ tablet Take 20 mEq by mouth every other day. Reported on 03/14/2015  . [DISCONTINUED] insulin NPH-regular Human (NOVOLIN 70/30) (70-30) 100 UNIT/ML injection Inject 45 Units into the skin 2 (two) times daily with a meal.    No facility-administered encounter medications on file as of 08/07/2016.     Activities of Daily Living In your present state of health, do you have any difficulty performing the following activities: 08/07/2016  Hearing? N  Vision? N  Difficulty concentrating or making decisions? N  Walking or climbing stairs? Y  Dressing or bathing? N  Doing errands, shopping? N  Preparing Food  and eating ? N  Using the Toilet? N  In the past six months, have you accidently leaked urine? N  Do you have problems with loss of bowel control? N  Managing your Medications? Y  Managing your Finances? N  Housekeeping or managing your Housekeeping? N  Some recent data might be hidden    Patient Care Team: Lucille Passy, MD as PCP - Dossie Arbour, MD as Consulting Physician (Gastroenterology) Minna Merritts, MD as Consulting Physician (Cardiology) Gabriel Carina Betsey Holiday, MD as Consulting Physician (Endocrinology) Murlean Iba, MD (Nephrology) Sharlotte Alamo, [place] (Podiatry) Zadie Rhine Clent Demark, MD as Consulting Physician  (Ophthalmology) Sharyne Peach, MD as Consulting Physician (Ophthalmology)   Assessment:    Physical assessment deferred to PCP.  Exercise Activities and Dietary recommendations Current Exercise Habits: The patient does not participate in regular exercise at present  Goals    . Increase water intake          Starting 08/07/2016, I will attempt to drink 4 glasses of water daily.      Fall Risk Fall Risk  08/07/2016 02/28/2015  Falls in the past year? No Yes  Number falls in past yr: - 1  Injury with Fall? - Yes  Risk for fall due to : Impaired balance/gait;Impaired mobility;Impaired vision -   Depression Screen PHQ 2/9 Scores 08/07/2016 02/28/2015 12/13/2011  PHQ - 2 Score 0 0 0    Cognitive Function PLEASE NOTE: A Mini-Cog screen was completed. Maximum score is 20. A value of 0 denotes this part of Folstein MMSE was not completed or the patient failed this part of the Mini-Cog screening.   Mini-Cog Screening Orientation to Time - Max 5 pts Orientation to Place - Max 5 pts Registration - Max 3 pts Recall - Max 3 pts Language Repeat - Max 1 pts Language Follow 3 Step Command - Max 3 pts      Mini-Cog - 08/07/16 1456    Normal clock drawing test? no   How many words correct? 2      MMSE - Mini Mental State Exam 08/07/2016  Orientation to time 0  Orientation to time comments Does not know month, date, year, day of the week, or president.  Orientation to Place 5  Registration 3  Attention/ Calculation 0  Recall 2  Language- name 2 objects 0  Language- repeat 1  Language- follow 3 step command 3  Language- read & follow direction 0  Write a sentence 0  Copy design 0  Total score 14        Immunization History  Administered Date(s) Administered  . Influenza Split 10/17/2010, 10/16/2011, 10/25/2014  . Influenza Whole 10/29/2006, 10/21/2007, 11/02/2008, 10/11/2009  . Influenza,inj,Quad PF,36+ Mos 09/17/2013  . Influenza-Unspecified 09/22/2012  . Pneumococcal  Conjugate-13 02/28/2015  . Pneumococcal Polysaccharide-23 07/11/2007  . Td 11/22/2008, 02/28/2015  . Zoster 11/22/2008   Screening Tests Health Maintenance  Topic Date Due  . OPHTHALMOLOGY EXAM  09/11/2015  . FOOT EXAM  02/28/2016  . INFLUENZA VACCINE  08/22/2016  . HEMOGLOBIN A1C  01/10/2017  . TETANUS/TDAP  02/27/2025  . PNA vac Low Risk Adult  Completed      Plan:    Follow-up w/ PCP as scheduled.  I have personally reviewed and noted the following in the patient's chart:   . Medical and social history . Use of alcohol, tobacco or illicit drugs  . Current medications and supplements . Functional ability and status . Nutritional status . Physical activity . Advanced  directives . List of other physicians . Vitals . Screenings to include cognitive, depression, and falls . Referrals and appointments  In addition, I have reviewed and discussed with patient certain preventive protocols, quality metrics, and best practice recommendations. A written personalized care plan for preventive services as well as general preventive health recommendations were provided to patient.     Dorrene German, RN  08/07/2016

## 2016-08-07 NOTE — Addendum Note (Signed)
Addended by: Dorrene German on: 08/07/2016 03:44 PM   Modules accepted: Level of Service

## 2016-08-12 ENCOUNTER — Other Ambulatory Visit: Payer: Self-pay | Admitting: Family Medicine

## 2016-08-13 ENCOUNTER — Encounter: Payer: Self-pay | Admitting: Family Medicine

## 2016-08-13 ENCOUNTER — Ambulatory Visit (INDEPENDENT_AMBULATORY_CARE_PROVIDER_SITE_OTHER): Payer: PPO | Admitting: Family Medicine

## 2016-08-13 VITALS — BP 130/70 | HR 54 | Ht 65.0 in | Wt 227.0 lb

## 2016-08-13 DIAGNOSIS — L570 Actinic keratosis: Secondary | ICD-10-CM | POA: Diagnosis not present

## 2016-08-13 DIAGNOSIS — I10 Essential (primary) hypertension: Secondary | ICD-10-CM | POA: Diagnosis not present

## 2016-08-13 DIAGNOSIS — E1165 Type 2 diabetes mellitus with hyperglycemia: Secondary | ICD-10-CM | POA: Diagnosis not present

## 2016-08-13 DIAGNOSIS — E1121 Type 2 diabetes mellitus with diabetic nephropathy: Secondary | ICD-10-CM

## 2016-08-13 DIAGNOSIS — E162 Hypoglycemia, unspecified: Secondary | ICD-10-CM | POA: Diagnosis not present

## 2016-08-13 DIAGNOSIS — Z0000 Encounter for general adult medical examination without abnormal findings: Secondary | ICD-10-CM

## 2016-08-13 DIAGNOSIS — L858 Other specified epidermal thickening: Secondary | ICD-10-CM

## 2016-08-13 DIAGNOSIS — E785 Hyperlipidemia, unspecified: Secondary | ICD-10-CM | POA: Diagnosis not present

## 2016-08-13 DIAGNOSIS — IMO0002 Reserved for concepts with insufficient information to code with codable children: Secondary | ICD-10-CM

## 2016-08-13 NOTE — Assessment & Plan Note (Signed)
Well controlled. No changes made to rxs. 

## 2016-08-13 NOTE — Assessment & Plan Note (Signed)
Due for lipid panel today. No changes made to rxs.

## 2016-08-13 NOTE — Assessment & Plan Note (Signed)
Cutaneous horns are snipped off using Betadine for cleansing and sterile iris scissors. Local anesthesia was  used. These pathognomonic lesions are not sent for pathology.

## 2016-08-13 NOTE — Addendum Note (Signed)
Addended by: Mady Haagensen on: 08/13/2016 04:44 PM   Modules accepted: Orders

## 2016-08-13 NOTE — Progress Notes (Signed)
Subjective:   Patient ID: Damon [name], male    DOB: 04-04-1933, 83 y.o.   MRN: [id number]  Damon [name] is a pleasant 83 y.o. year old male who presents to clinic today with Annual Exam  and follow up of chronic medical conditions on 08/13/2016  HPI:  Saw Lindell Noe, RN on 08/07/16 for medicare wellness visit. Note reviewed.  HTN- has been well controlled norvasc 5 mg daily, lasix 40 mg daily.  Wants me to remove 3 skin tags today.  Growing larger and getting more irritated.  Two on his left arm, one on his right.  Sees [name] for diabetes- last saw her on 07/20/16 Note reviewed. Advised to decrease insulin due to hypoglycemia. Taking ACEI.  Lab Results  Component Value Date   HGBA1C 7.1 07/11/2016   HLD- taking lopid.  Lab Results  Component Value Date   CHOL 127 01/26/2015   HDL 37.80 (L) 01/26/2015   LDLCALC 75 01/26/2015   TRIG 69.0 01/26/2015   CHOLHDL 3 01/26/2015   Lab Results  Component Value Date   NA 138 10/11/2015   K 4.2 10/11/2015   CL 103 10/11/2015   CO2 28 10/11/2015   Current Outpatient Prescriptions on File Prior to Visit  Medication Sig Dispense Refill  . amLODipine (NORVASC) 5 MG tablet Take 1 tablet by mouth 2 (two) times daily.    Marland Kitchen aspirin 81 MG tablet Take 81 mg by mouth daily.      . furosemide (LASIX) 40 MG tablet TAKE 1 TABLET BY MOUTH EVERY OTHER DAY 45 tablet 2  . gemfibrozil (LOPID) 600 MG tablet TAKE 1 TABLET BY MOUTH TWICE DAILY BEFORE MEAL(S) 180 tablet 0  . glucose blood test strip Use as instructed 100 each 12  . insulin NPH-regular Human (NOVOLIN 70/30) (70-30) 100 UNIT/ML injection Inject 28 units before breakfast and 38 units before supper    . Insulin Syringe-Needle U-100 (B-D INS SYRINGE 0.5CC/30GX1/2") 30G X 1/2" 0.5 ML MISC Use to inject insulin twice a day. 100 each 1  . lisinopril (PRINIVIL,ZESTRIL) 2.5 MG tablet Take 1 tablet (2.5 mg total) by mouth daily. 90 tablet 3  . Omega-3 Fatty Acids (FISH OIL PO) Take 1  capsule by mouth daily.    . potassium chloride SA (K-DUR,KLOR-CON) 20 MEQ tablet Take 20 mEq by mouth every other day. Reported on 03/14/2015    . tamsulosin (FLOMAX) 0.4 MG CAPS capsule Take 0.4 mg by mouth daily.    . Vitamin D, Ergocalciferol, (DRISDOL) 50000 units CAPS capsule TAKE 1 CAPSULE BY MOUTH ONCE A WEEK 12 capsule 0   No current facility-administered medications on file prior to visit.     Allergies  Allergen Reactions  . Penicillins Other (See Comments)    Reaction as a child    Past Medical History:  Diagnosis Date  . Arrhythmia    Baseline bradycardia  . Bradycardia   . CHF (congestive heart failure) ([place])   . Chronic kidney disease    BAseline creatinine 1.4 to 1.5  . Diabetes mellitus   . Hard of hearing   . Hypertension     Past Surgical History:  Procedure Laterality Date  . COLONOSCOPY  01/01/2012   Procedure: COLONOSCOPY;  Surgeon: [name], MD;  Location: [facility] ENDOSCOPY;  Service: Endoscopy;  Laterality: N/A;  . PARATHYROIDECTOMY    . TONSILLECTOMY      Family History  Problem Relation Age of Onset  . Diabetes type II Mother   .  Hypertension Mother   . Diabetes type II Father   . Hypertension Father     Social History   Social History  . Marital status: Widowed    Spouse name: N/A  . Number of children: N/A  . Years of education: N/A   Occupational History  . Not on file.   Social History Main Topics  . Smoking status: Never Smoker  . Smokeless tobacco: Former Systems developer    Types: Chew     Comment: chewing more than 40 years/chew 1PPD  . Alcohol use No  . Drug use: No  . Sexual activity: Not on file   Other Topics Concern  . Not on file   Social History Narrative   Would desire CPR   The PMH, PSH, Social History, Family History, Medications, and allergies have been reviewed in [facility], and have been updated if relevant.    Review of Systems  Constitutional: Negative.   HENT: Negative.   Respiratory: Negative.     Cardiovascular: Negative.   Gastrointestinal: Negative.   Endocrine: Negative.   Genitourinary: Negative.   Musculoskeletal: Negative.   Skin:       + skin tags  Allergic/Immunologic: Negative.   Neurological: Negative.   Hematological: Negative.   Psychiatric/Behavioral: Negative.   All other systems reviewed and are negative.      Objective:    BP 130/70   Pulse (!) 54   Ht 5\' 5"  (1.651 m)   Wt 227 lb (103 kg)   SpO2 98%   BMI 37.77 kg/m    Physical Exam  General:  pleasant male in no acute distress Eyes:  PERRL Ears:  External ear exam shows no significant lesions or deformities.  TMs normal bilaterally Hearing is grossly normal bilaterally. Nose:  External nasal examination shows no deformity or inflammation. Nasal mucosa are pink and moist without lesions or exudates. Mouth:  Oral mucosa and oropharynx without lesions or exudates.  Teeth in good repair. Neck:  no carotid bruit or thyromegaly no cervical or supraclavicular lymphadenopathy  Lungs:  Normal respiratory effort, chest expands symmetrically. Lungs are clear to auscultation, no crackles or wheezes. Heart:  Normal rate and regular rhythm. S1 and S2 normal without gallop, murmur, click, rub or other extra sounds. Abdomen:  Bowel sounds positive,abdomen soft and non-tender without masses, organomegaly or hernias noted. 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

## 2016-08-13 NOTE — Assessment & Plan Note (Signed)
Reviewed preventive care protocols, scheduled due services, and updated immunizations Discussed nutrition, exercise, diet, and healthy lifestyle.  

## 2016-08-14 LAB — COMPREHENSIVE METABOLIC PANEL
ALT: 14 U/L (ref 0–53)
AST: 15 U/L (ref 0–37)
Albumin: 4.5 g/dL (ref 3.5–5.2)
Alkaline Phosphatase: 91 U/L (ref 39–117)
BUN: 40 mg/dL — ABNORMAL HIGH (ref 6–23)
CO2: 26 mEq/L (ref 19–32)
Calcium: 9.3 mg/dL (ref 8.4–10.5)
Chloride: 101 mEq/L (ref 96–112)
Creatinine, Ser: 2.12 mg/dL — ABNORMAL HIGH (ref 0.40–1.50)
GFR: 32 mL/min — ABNORMAL LOW (ref >60.00)
Glucose, Bld: 184 mg/dL — ABNORMAL HIGH (ref 70–99)
Potassium: 4.7 mEq/L (ref 3.5–5.1)
Sodium: 138 mEq/L (ref 135–145)
Total Bilirubin: 0.6 mg/dL (ref 0.2–1.2)
Total Protein: 7.2 g/dL (ref 6.0–8.3)

## 2016-08-14 LAB — LIPID PANEL
Cholesterol: 145 mg/dL (ref 0–200)
HDL: 34.4 mg/dL — ABNORMAL LOW (ref >39.00)
LDL Cholesterol: 78 mg/dL (ref 0–99)
NonHDL: 110.15
Total CHOL/HDL Ratio: 4
Triglycerides: 162 mg/dL — ABNORMAL HIGH (ref 0.0–149.0)
VLDL: 32.4 mg/dL (ref 0.0–40.0)

## 2016-08-17 DIAGNOSIS — E113591 Type 2 diabetes mellitus with proliferative diabetic retinopathy without macular edema, right eye: Secondary | ICD-10-CM | POA: Diagnosis not present

## 2016-08-17 DIAGNOSIS — E113512 Type 2 diabetes mellitus with proliferative diabetic retinopathy with macular edema, left eye: Secondary | ICD-10-CM | POA: Diagnosis not present

## 2016-08-17 DIAGNOSIS — H211X2 Other vascular disorders of iris and ciliary body, left eye: Secondary | ICD-10-CM | POA: Diagnosis not present

## 2016-08-17 DIAGNOSIS — H4052X3 Glaucoma secondary to other eye disorders, left eye, severe stage: Secondary | ICD-10-CM | POA: Diagnosis not present

## 2016-08-17 NOTE — Telephone Encounter (Signed)
Seen 08/07/16

## 2016-09-03 DIAGNOSIS — E1129 Type 2 diabetes mellitus with other diabetic kidney complication: Secondary | ICD-10-CM | POA: Diagnosis not present

## 2016-09-03 DIAGNOSIS — R809 Proteinuria, unspecified: Secondary | ICD-10-CM | POA: Diagnosis not present

## 2016-09-03 DIAGNOSIS — N183 Chronic kidney disease, stage 3 (moderate): Secondary | ICD-10-CM | POA: Diagnosis not present

## 2016-09-03 DIAGNOSIS — R609 Edema, unspecified: Secondary | ICD-10-CM | POA: Diagnosis not present

## 2016-09-03 DIAGNOSIS — I10 Essential (primary) hypertension: Secondary | ICD-10-CM | POA: Diagnosis not present

## 2016-09-03 LAB — BASIC METABOLIC PANEL
BUN: 34 — AB (ref 4–21)
Creatinine: 1.9 — AB (ref 0.6–1.3)
Glucose: 83
Potassium: 4.4 (ref 3.4–5.3)
Sodium: 143 (ref 137–147)

## 2016-09-07 DIAGNOSIS — H401131 Primary open-angle glaucoma, bilateral, mild stage: Secondary | ICD-10-CM | POA: Diagnosis not present

## 2016-10-01 DIAGNOSIS — E1140 Type 2 diabetes mellitus with diabetic neuropathy, unspecified: Secondary | ICD-10-CM | POA: Diagnosis not present

## 2016-10-01 DIAGNOSIS — B351 Tinea unguium: Secondary | ICD-10-CM | POA: Diagnosis not present

## 2016-10-01 DIAGNOSIS — Z794 Long term (current) use of insulin: Secondary | ICD-10-CM | POA: Diagnosis not present

## 2016-10-01 DIAGNOSIS — L851 Acquired keratosis [keratoderma] palmaris et plantaris: Secondary | ICD-10-CM | POA: Diagnosis not present

## 2016-10-06 ENCOUNTER — Other Ambulatory Visit: Payer: Self-pay | Admitting: Family Medicine

## 2016-10-12 DIAGNOSIS — E113591 Type 2 diabetes mellitus with proliferative diabetic retinopathy without macular edema, right eye: Secondary | ICD-10-CM | POA: Diagnosis not present

## 2016-10-12 DIAGNOSIS — E113512 Type 2 diabetes mellitus with proliferative diabetic retinopathy with macular edema, left eye: Secondary | ICD-10-CM | POA: Diagnosis not present

## 2016-10-19 DIAGNOSIS — Z794 Long term (current) use of insulin: Secondary | ICD-10-CM | POA: Diagnosis not present

## 2016-10-19 DIAGNOSIS — E11649 Type 2 diabetes mellitus with hypoglycemia without coma: Secondary | ICD-10-CM | POA: Diagnosis not present

## 2016-10-26 DIAGNOSIS — E11649 Type 2 diabetes mellitus with hypoglycemia without coma: Secondary | ICD-10-CM | POA: Diagnosis not present

## 2016-10-26 DIAGNOSIS — Z794 Long term (current) use of insulin: Secondary | ICD-10-CM | POA: Diagnosis not present

## 2016-10-26 DIAGNOSIS — E875 Hyperkalemia: Secondary | ICD-10-CM | POA: Diagnosis not present

## 2016-10-26 DIAGNOSIS — N183 Chronic kidney disease, stage 3 (moderate): Secondary | ICD-10-CM | POA: Diagnosis not present

## 2016-10-26 DIAGNOSIS — E1122 Type 2 diabetes mellitus with diabetic chronic kidney disease: Secondary | ICD-10-CM | POA: Diagnosis not present

## 2016-10-26 DIAGNOSIS — E113293 Type 2 diabetes mellitus with mild nonproliferative diabetic retinopathy without macular edema, bilateral: Secondary | ICD-10-CM | POA: Diagnosis not present

## 2016-10-31 DIAGNOSIS — E875 Hyperkalemia: Secondary | ICD-10-CM | POA: Diagnosis not present

## 2016-11-21 ENCOUNTER — Emergency Department
Admission: EM | Admit: 2016-11-21 | Discharge: 2016-11-21 | Disposition: A | Discharge status: Home / Self Care | Payer: PPO | Attending: Emergency Medicine | Admitting: Emergency Medicine

## 2016-11-21 ENCOUNTER — Encounter: Payer: Self-pay | Admitting: Emergency Medicine

## 2016-11-21 DIAGNOSIS — Z7982 Long term (current) use of aspirin: Secondary | ICD-10-CM | POA: Diagnosis not present

## 2016-11-21 DIAGNOSIS — N189 Chronic kidney disease, unspecified: Secondary | ICD-10-CM | POA: Insufficient documentation

## 2016-11-21 DIAGNOSIS — E1121 Type 2 diabetes mellitus with diabetic nephropathy: Secondary | ICD-10-CM | POA: Diagnosis not present

## 2016-11-21 DIAGNOSIS — I5031 Acute diastolic (congestive) heart failure: Secondary | ICD-10-CM | POA: Diagnosis not present

## 2016-11-21 DIAGNOSIS — I130 Hypertensive heart and chronic kidney disease with heart failure and stage 1 through stage 4 chronic kidney disease, or unspecified chronic kidney disease: Secondary | ICD-10-CM | POA: Insufficient documentation

## 2016-11-21 DIAGNOSIS — Z794 Long term (current) use of insulin: Secondary | ICD-10-CM | POA: Diagnosis not present

## 2016-11-21 DIAGNOSIS — Z79899 Other long term (current) drug therapy: Secondary | ICD-10-CM | POA: Diagnosis not present

## 2016-11-21 DIAGNOSIS — R001 Bradycardia, unspecified: Secondary | ICD-10-CM | POA: Diagnosis not present

## 2016-11-21 DIAGNOSIS — R040 Epistaxis: Secondary | ICD-10-CM | POA: Insufficient documentation

## 2016-11-21 DIAGNOSIS — I499 Cardiac arrhythmia, unspecified: Secondary | ICD-10-CM | POA: Diagnosis not present

## 2016-11-21 MED ORDER — OXYMETAZOLINE HCL 0.05 % NA SOLN
1.0000 | Freq: Two times a day (BID) | NASAL | Status: DC
  Administered 2016-11-21: 1 via NASAL
  Filled 2016-11-21: qty 15

## 2016-11-21 NOTE — ED Triage Notes (Signed)
Pt was brought in by EMS after having a prolonged nose bleed at home. Pt has blood on face, neck and chest. EMS stating that pt was given Afrin PTA. Pt's bleeding appears controlled at this time, with a large clot formation in bilateral nares. Pt in NAD at this time and is denying swallowing any blood.

## 2016-11-21 NOTE — ED Notes (Signed)
Pt discharged home after verbalizing understanding of discharge instructions; nad noted. 

## 2016-11-21 NOTE — Discharge Instructions (Signed)
Hold off on your aspirin for 2 days and then restart.  Hold pressure if nosebleed restarts for about 10 minutes before rechecking.  You may try the Afrin twice daily for no more than 3 days.  Return to the emergency department immediately for any new or worsening condition including uncontrolled bleeding, any dizziness or passing out, chest pain, or any other symptoms concerning to you.

## 2016-11-21 NOTE — ED Provider Notes (Signed)
[facility] Emergency Department Provider Note ____________________________________________   I have reviewed the triage vital signs and the triage nursing note.  HISTORY  Chief Complaint Epistaxis   Historian Level 5 Caveat History Limited by poor historian  HPI Damon [name] is a 83 y.o. male presents with nosebleed out of the right nare.  Started around 7 AM.  No history of nosebleeds.  He takes baby aspirin, denies history of stroke or heart attack.  Take blood pressure medicine.  He is hard of hearing.  Patient holding those during ambulance ride.  Symptoms moderate.     Past Medical History:  Diagnosis Date  . Arrhythmia    Baseline bradycardia  . Bradycardia   . CHF (congestive heart failure) ([place])   . Chronic kidney disease    BAseline creatinine 1.4 to 1.5  . Diabetes mellitus   . Hard of hearing   . Hypertension     Patient Active Problem List   Diagnosis Date Noted  . 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 Visit for well man health check 08/07/2016  . Mood swings 10/11/2015  . Chronic renal disease 09/11/2013  . Hypoglycemia 09/11/2013  . Diastolic CHF ([place]) 29/52/8413  . Hyperlipidemia 09/09/2013  . 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 DIABETIC PERIPHERAL NEUROPATHY 12/04/2005  . OBESITY 12/04/2005  . Essential hypertension 12/04/2005    Past Surgical History:  Procedure Laterality Date  . COLONOSCOPY  01/01/2012   Procedure: COLONOSCOPY;  Surgeon: [name], MD;  Location: [facility] ENDOSCOPY;  Service: Endoscopy;  Laterality: N/A;  . PARATHYROIDECTOMY    . TONSILLECTOMY      Prior to Admission medications   Medication Sig Start Date End Date Taking? Authorizing Provider  amLODipine (NORVASC) 5 MG tablet Take 1 tablet by mouth 2 (two) times daily.    [provider]  aspirin 81 MG tablet Take 81 mg by mouth daily.      [provider]  furosemide (LASIX) 40 MG  tablet TAKE 1 TABLET BY MOUTH EVERY OTHER DAY 08/13/16   Lucille Passy, MD  gemfibrozil (LOPID) 600 MG tablet TAKE 1 TABLET BY MOUTH TWICE DAILY BEFORE MEAL(S) 10/08/16   Lucille Passy, MD  glucose blood test strip Use as instructed 04/04/15   Lucille Passy, MD  insulin NPH-regular Human (NOVOLIN 70/30) (70-30) 100 UNIT/ML injection Inject 28 units before breakfast and 38 units before supper 07/20/16   [provider]  Insulin Syringe-Needle U-100 (B-D INS SYRINGE 0.5CC/30GX1/2") 30G X 1/2" 0.5 ML MISC Use to inject insulin twice a day. 04/04/15   Lucille Passy, MD  lisinopril (PRINIVIL,ZESTRIL) 2.5 MG tablet Take 1 tablet (2.5 mg total) by mouth daily. 03/28/15   Lucille Passy, MD  Omega-3 Fatty Acids (FISH OIL PO) Take 1 capsule by mouth daily.    [provider]  potassium chloride SA (K-DUR,KLOR-CON) 20 MEQ tablet Take 20 mEq by mouth every other day. Reported on 03/14/2015    [provider]  tamsulosin (FLOMAX) 0.4 MG CAPS capsule Take 0.4 mg by mouth daily. 10/26/12   [provider]  Vitamin D, Ergocalciferol, (DRISDOL) 50000 units CAPS capsule TAKE 1 CAPSULE BY MOUTH ONCE A WEEK 10/08/16   Lucille Passy, MD    Allergies  Allergen Reactions  . Penicillins Other (See Comments)    Reaction as a child    Family History  Problem Relation Age of Onset  . Diabetes type II Mother   .  Hypertension Mother   . Diabetes type II Father   . Hypertension Father     Social History Social History  Substance Use Topics  . Smoking status: Never Smoker  . Smokeless tobacco: Former Systems developer    Types: Chew     Comment: chewing more than 40 years/chew 1PPD  . Alcohol use No    Review of Systems  Patient poor historian but answered. Constitutional: Negative for fever. Eyes: Negative for visual changes. ENT: Negative for sore throat. Cardiovascular: Negative for chest pain. Respiratory: Negative for shortness of breath. Gastrointestinal: Negative for abdominal pain,  vomiting and diarrhea. Genitourinary: Negative for dysuria. Musculoskeletal: Negative for back pain. Skin: Negative for rash. Neurological: Negative for headache.  ____________________________________________   PHYSICAL EXAM:  VITAL SIGNS: ED Triage Vitals  Enc Vitals Group     BP      Pulse      Resp      Temp      Temp src      SpO2      Weight      Height      Head Circumference      Peak Flow      Pain Score      Pain Loc      Pain Edu?      Excl. in [place]?      Constitutional: Alert and cooperative. Well appearing and in no distress. HEENT   Head: Normocephalic and atraumatic.      Eyes: Conjunctivae are normal. Pupils equal and round.       Ears:         Nose: No congestion/rhinnorhea.  Bloody clot intact at the right nare, hemostatic.   Mouth/Throat: Mucous membranes are moist.   Neck: No stridor. Cardiovascular/Chest: Normal rate, regular rhythm.  No murmurs, rubs, or gallops. Respiratory: Normal respiratory effort without tachypnea nor retractions. Breath sounds are clear and equal bilaterally. No wheezes/rales/rhonchi. Gastrointestinal: Soft. No distention, no guarding, no rebound. Nontender.  Obese Genitourinary/rectal:Deferred Musculoskeletal: Nontender with normal range of motion in all extremities. No joint effusions.  No lower extremity tenderness.  No edema. Neurologic:  Normal speech and language. No gross or focal neurologic deficits are appreciated. Skin:  Skin is warm, dry and intact. No rash noted. Psychiatric: Mood and affect are normal. Speech and behavior are normal. Patient exhibits appropriate insight and judgment.   ____________________________________________  LABS (pertinent positives/negatives) I, Lisa Roca, MD the attending physician have reviewed the labs noted below.  Labs Reviewed - No data to display  ____________________________________________    EKG I, Lisa Roca, MD, the attending physician have personally  viewed and interpreted all ECGs.  None ____________________________________________  RADIOLOGY All Xrays were viewed by me.  Imaging interpreted by Radiologist, and I, Lisa Roca, MD the attending physician have reviewed the radiologist interpretation noted below.  None __________________________________________  PROCEDURES  Procedure(s) performed: None  Critical Care performed: None  ____________________________________________  No current facility-administered medications on file prior to encounter.    Current Outpatient Prescriptions on File Prior to Encounter  Medication Sig Dispense Refill  . amLODipine (NORVASC) 5 MG tablet Take 1 tablet by mouth 2 (two) times daily.    Marland Kitchen aspirin 81 MG tablet Take 81 mg by mouth daily.      . furosemide (LASIX) 40 MG tablet TAKE 1 TABLET BY MOUTH EVERY OTHER DAY 45 tablet 2  . gemfibrozil (LOPID) 600 MG tablet TAKE 1 TABLET BY MOUTH TWICE DAILY BEFORE MEAL(S) 180 tablet 3  .  glucose blood test strip Use as instructed 100 each 12  . insulin NPH-regular Human (NOVOLIN 70/30) (70-30) 100 UNIT/ML injection Inject 28 units before breakfast and 38 units before supper    . Insulin Syringe-Needle U-100 (B-D INS SYRINGE 0.5CC/30GX1/2") 30G X 1/2" 0.5 ML MISC Use to inject insulin twice a day. 100 each 1  . lisinopril (PRINIVIL,ZESTRIL) 2.5 MG tablet Take 1 tablet (2.5 mg total) by mouth daily. 90 tablet 3  . Omega-3 Fatty Acids (FISH OIL PO) Take 1 capsule by mouth daily.    . potassium chloride SA (K-DUR,KLOR-CON) 20 MEQ tablet Take 20 mEq by mouth every other day. Reported on 03/14/2015    . tamsulosin (FLOMAX) 0.4 MG CAPS capsule Take 0.4 mg by mouth daily.    . Vitamin D, Ergocalciferol, (DRISDOL) 50000 units CAPS capsule TAKE 1 CAPSULE BY MOUTH ONCE A WEEK 12 capsule 3    ____________________________________________  ED COURSE / ASSESSMENT AND PLAN  Pertinent labs & imaging results that were available during my care of the patient were  reviewed by me and considered in my medical decision making (see chart for details).   Blood pressure in the 130s.  Patient had Afrin to both nares and nasal clip placed.  After period of observation, a bloody clot is there, but hemostatic.  I discussed conservative management and risk for rebleed but to tried to apply pressure.  We discussed whether or not to hold the aspirin, and chose to hold aspirin for 2 days and then restart.  Daughter at the bedside.   CONSULTATIONS:   None   Patient / Family / Caregiver informed of clinical course, medical decision-making process, and agree with plan.   I discussed return precautions, follow-up instructions, and discharge instructions with patient and/or family.  Discharge Instructions : Hold off on your aspirin for 2 days and then restart.  Hold pressure if nosebleed restarts for about 10 minutes before rechecking.  You may try the Afrin twice daily for no more than 3 days.  Return to the emergency department immediately for any new or worsening condition including uncontrolled bleeding, any dizziness or passing out, chest pain, or any other symptoms concerning to you.  ___________________________________________   FINAL CLINICAL IMPRESSION(S) / ED DIAGNOSES   Final diagnoses:  Epistaxis              Note: This dictation was prepared with Dragon dictation. Any transcriptional errors that result from this process are unintentional    Lisa Roca, MD 11/21/16 1235

## 2016-11-23 NOTE — Patient Outreach (Signed)
Outreach patient after ED visit on 11/21/16 at [facility].  Patient was not available to talk but his Daughter answered the phone.  I explained [place] to her and said I would mail information for them to review and to give a call back with the patient so we could verify if it is ok to speak to her.  I did not see a DPR on file.  Unusuccessful letter, magnet and know before you go mailed on 11/23/16.

## 2016-11-29 ENCOUNTER — Encounter: Payer: Self-pay | Admitting: Emergency Medicine

## 2016-11-29 ENCOUNTER — Emergency Department
Admission: EM | Admit: 2016-11-29 | Discharge: 2016-11-29 | Disposition: A | Discharge status: Home / Self Care | Payer: PPO | Attending: Emergency Medicine | Admitting: Emergency Medicine

## 2016-11-29 DIAGNOSIS — Z79899 Other long term (current) drug therapy: Secondary | ICD-10-CM | POA: Insufficient documentation

## 2016-11-29 DIAGNOSIS — I130 Hypertensive heart and chronic kidney disease with heart failure and stage 1 through stage 4 chronic kidney disease, or unspecified chronic kidney disease: Secondary | ICD-10-CM | POA: Insufficient documentation

## 2016-11-29 DIAGNOSIS — Z794 Long term (current) use of insulin: Secondary | ICD-10-CM | POA: Diagnosis not present

## 2016-11-29 DIAGNOSIS — R040 Epistaxis: Secondary | ICD-10-CM | POA: Insufficient documentation

## 2016-11-29 DIAGNOSIS — E1122 Type 2 diabetes mellitus with diabetic chronic kidney disease: Secondary | ICD-10-CM | POA: Diagnosis not present

## 2016-11-29 DIAGNOSIS — I5030 Unspecified diastolic (congestive) heart failure: Secondary | ICD-10-CM | POA: Insufficient documentation

## 2016-11-29 DIAGNOSIS — Z87891 Personal history of nicotine dependence: Secondary | ICD-10-CM | POA: Insufficient documentation

## 2016-11-29 DIAGNOSIS — N189 Chronic kidney disease, unspecified: Secondary | ICD-10-CM | POA: Insufficient documentation

## 2016-11-29 DIAGNOSIS — Z7982 Long term (current) use of aspirin: Secondary | ICD-10-CM | POA: Insufficient documentation

## 2016-11-29 MED ORDER — TRANEXAMIC ACID 1000 MG/10ML IV SOLN
500.0000 mg | Freq: Once | INTRAVENOUS | Status: DC
  Filled 2016-11-29: qty 10

## 2016-11-29 NOTE — ED Notes (Signed)
Per provider pt is still actively bleeding when nose clamp removed. Charge RN notified per provider for main side . Will continue to monitor pt

## 2016-11-29 NOTE — ED Provider Notes (Signed)
[facility] Emergency Department Provider Note  ____________________________________________  Time seen: Approximately 11:30 AM  I have reviewed the triage vital signs and the nursing notes.   HISTORY  Chief Complaint Epistaxis    HPI Damon [name] is a 83 y.o. male brought to the ED due to nosebleed that started this morning at about 7 AM. Patient used Afrin and clamped it, and on arrival here in the ED bleeding seems to have stopped. Denies any trauma, but does have a history of nose picking per family.Expect baby aspirin but no blood thinners. No dizziness chest pain or shortness of breath.     Past Medical History:  Diagnosis Date  . Arrhythmia    Baseline bradycardia  . Bradycardia   . CHF (congestive heart failure) ([place])   . Chronic kidney disease    BAseline creatinine 1.4 to 1.5  . Diabetes mellitus   . Hard of hearing   . Hypertension      Patient Active Problem List   Diagnosis Date Noted  . 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 Visit for well man health check 08/07/2016  . Mood swings 10/11/2015  . Chronic renal disease 09/11/2013  . Hypoglycemia 09/11/2013  . Diastolic CHF ([place]) 62/22/9798  . Hyperlipidemia 09/09/2013  . Uncontrolled type II diabetes mellitus with nephropathy ([place]) 12/04/2005  . DIABETIC PERIPHERAL NEUROPATHY 12/04/2005  . OBESITY 12/04/2005  . Essential hypertension 12/04/2005     Past Surgical History:  Procedure Laterality Date  . PARATHYROIDECTOMY    . TONSILLECTOMY       Prior to Admission medications   Medication Sig Start Date End Date Taking? Authorizing Provider  amLODipine (NORVASC) 5 MG tablet Take 1 tablet by mouth 2 (two) times daily.    [provider]  aspirin 81 MG tablet Take 81 mg by mouth daily.      [provider]  furosemide (LASIX) 40 MG tablet TAKE 1 TABLET BY MOUTH EVERY OTHER DAY 08/13/16   Lucille Passy, MD  gemfibrozil (LOPID) 600 MG tablet TAKE 1 TABLET BY MOUTH  TWICE DAILY BEFORE MEAL(S) 10/08/16   Lucille Passy, MD  glucose blood test strip Use as instructed 04/04/15   Lucille Passy, MD  insulin NPH-regular Human (NOVOLIN 70/30) (70-30) 100 UNIT/ML injection Inject 28 units before breakfast and 38 units before supper 07/20/16   [provider]  Insulin Syringe-Needle U-100 (B-D INS SYRINGE 0.5CC/30GX1/2") 30G X 1/2" 0.5 ML MISC Use to inject insulin twice a day. 04/04/15   Lucille Passy, MD  lisinopril (PRINIVIL,ZESTRIL) 2.5 MG tablet Take 1 tablet (2.5 mg total) by mouth daily. 03/28/15   Lucille Passy, MD  Omega-3 Fatty Acids (FISH OIL PO) Take 1 capsule by mouth daily.    [provider]  potassium chloride SA (K-DUR,KLOR-CON) 20 MEQ tablet Take 20 mEq by mouth every other day. Reported on 03/14/2015    [provider]  tamsulosin (FLOMAX) 0.4 MG CAPS capsule Take 0.4 mg by mouth daily. 10/26/12   [provider]  Vitamin D, Ergocalciferol, (DRISDOL) 50000 units CAPS capsule TAKE 1 CAPSULE BY MOUTH ONCE A WEEK 10/08/16   Lucille Passy, MD     Allergies Penicillins   Family History  Problem Relation Age of Onset  . Diabetes type II Mother   . Hypertension Mother   . Diabetes type II Father   . Hypertension Father     Social History Social History   Tobacco Use  . Smoking status:  Never Smoker  . Smokeless tobacco: Former Systems developer    Types: Chew  . Tobacco comment: chewing more than 40 years/chew 1PPD  Substance Use Topics  . Alcohol use: No    Alcohol/week: 0.0 oz  . Drug use: No    Review of Systems  Constitutional:   No fever or chills.  ENT:   No sore throat. No rhinorrhea. Nosebleed as above Cardiovascular:   No chest pain or syncope. Respiratory:   No dyspnea or cough. Gastrointestinal:   Negative for abdominal pain, vomiting and diarrhea.  Musculoskeletal:   Negative for focal pain or swelling All other systems reviewed and are negative except as documented above in ROS and  HPI.  ____________________________________________   PHYSICAL EXAM:  VITAL SIGNS: ED Triage Vitals  Enc Vitals Group     BP 11/29/16 0830 (!) 158/59     Pulse Rate 11/29/16 0830 (!) 53     Resp 11/29/16 0830 16     Temp 11/29/16 0830 98 F (36.7 C)     Temp Source 11/29/16 0830 Oral     SpO2 11/29/16 0830 98 %     Weight 11/29/16 0828 223 lb (101.2 kg)     Height 11/29/16 0828 5\' 7"  (1.702 m)     Head Circumference --      Peak Flow --      Pain Score 11/29/16 0858 0     Pain Loc --      Pain Edu? --      Excl. in [place]? --     Vital signs reviewed, nursing assessments reviewed.   Constitutional:   Alert and oriented. Well appearing and in no distress. Eyes:   No scleral icterus.  EOMI. No nystagmus. No conjunctival pallor. PERRL. ENT   Head:   Normocephalic and atraumatic.   Nose:   No congestion/rhinnorhea. Clotted blood in both nostrils. Linear scratches to the mucosa is apparent bleeding sites anteriorly in both nostrils.   Mouth/Throat:   MMM, no pharyngeal erythema. No peritonsillar mass. No blood in the oropharynx   Neck:   No meningismus. Full ROM. Hematological/Lymphatic/Immunilogical:   No cervical lymphadenopathy. Cardiovascular:   RRR. Symmetric bilateral radial and DP pulses.  No murmurs.  Respiratory:   Normal respiratory effort without tachypnea/retractions. Breath sounds are clear and equal bilaterally. No wheezes/rales/rhonchi. Gastrointestinal:   Soft and nontender. Non distended. There is no CVA tenderness.  No rebound, rigidity, or guarding. Genitourinary:   deferred Musculoskeletal:   Normal range of motion in all extremities. No joint effusions.  No lower extremity tenderness.  No edema. Neurologic:   Normal speech and language.  Motor grossly intact. No gross focal neurologic deficits are appreciated.  Skin:    Skin is warm, dry and intact. No rash noted.  No petechiae, purpura, or bullae.  ____________________________________________     LABS (pertinent positives/negatives) (all labs ordered are listed, but only abnormal results are displayed) Labs Reviewed - No data to display ____________________________________________   EKG    ____________________________________________    RADIOLOGY  No results found.  ____________________________________________   PROCEDURES .Epistaxis Management Date/Time: 11/29/2016 11:32 AM Performed by: Carrie Mew, MD Authorized by: Carrie Mew, MD   Consent:    Consent obtained:  Verbal   Consent given by:  Patient   Risks discussed:  Bleeding, nasal injury and pain   Alternatives discussed:  No treatment, alternative treatment, observation and referral Anesthesia (see MAR for exact dosages):    Anesthesia method:  None Procedure details:  Treatment site:  L anterior and R anterior   Repair method: TXA atomization.   Treatment complexity:  Limited   Treatment episode: initial   Post-procedure details:    Assessment:  Bleeding stopped   Patient tolerance of procedure:  Tolerated well, no immediate complications    ____________________________________________   CLINICAL IMPRESSION / ASSESSMENT AND PLAN / ED COURSE  Pertinent labs & imaging results that were available during my care of the patient were reviewed by me and considered in my medical decision making (see chart for details).   Patient well appearing no acute distress, presents with an episode of recurrent epistaxis, anterior due to mucosal scratch. Advised not to place anything including fingers into the nose. Bleeding remains stopped, he TXA added as well. Follow-up with primary care and ENT. Recommended humidifier at home.      ____________________________________________   FINAL CLINICAL IMPRESSION(S) / ED DIAGNOSES    Final diagnoses:  Epistaxis  Acute anterior epistaxis      This SmartLink is deprecated. Use AVSMEDLIST instead to display the medication list for a  patient.   Portions of this note were generated with dragon dictation software. Dictation errors may occur despite best attempts at proofreading.    Carrie Mew, MD 11/29/16 1134

## 2016-11-29 NOTE — ED Triage Notes (Signed)
Pt arrives to ED with a nose bleed, pt takes an Aspirin daily, pt has nose clamp in place, bleeding controlled, pt denies any other symptoms

## 2016-11-29 NOTE — ED Notes (Addendum)
Pt to ED for nosebleed, pt was recently seen for same and given clamp to take home. Per family pt bleed has been ongoing x2hrs today. Pt in NAD, clamp is still applied to nose. Takes Asprin daily

## 2016-12-21 DIAGNOSIS — R040 Epistaxis: Secondary | ICD-10-CM | POA: Diagnosis not present

## 2017-01-07 DIAGNOSIS — Z794 Long term (current) use of insulin: Secondary | ICD-10-CM | POA: Diagnosis not present

## 2017-01-07 DIAGNOSIS — B351 Tinea unguium: Secondary | ICD-10-CM | POA: Diagnosis not present

## 2017-01-07 DIAGNOSIS — L851 Acquired keratosis [keratoderma] palmaris et plantaris: Secondary | ICD-10-CM | POA: Diagnosis not present

## 2017-01-07 DIAGNOSIS — E1140 Type 2 diabetes mellitus with diabetic neuropathy, unspecified: Secondary | ICD-10-CM | POA: Diagnosis not present

## 2017-02-01 DIAGNOSIS — Z794 Long term (current) use of insulin: Secondary | ICD-10-CM | POA: Diagnosis not present

## 2017-02-01 DIAGNOSIS — E11649 Type 2 diabetes mellitus with hypoglycemia without coma: Secondary | ICD-10-CM | POA: Diagnosis not present

## 2017-02-01 LAB — HEMOGLOBIN A1C: Hemoglobin A1C: 7.8

## 2017-02-01 LAB — BASIC METABOLIC PANEL: Creatinine: 1.4 — AB (ref 0.6–1.3)

## 2017-02-08 DIAGNOSIS — E1122 Type 2 diabetes mellitus with diabetic chronic kidney disease: Secondary | ICD-10-CM | POA: Diagnosis not present

## 2017-02-08 DIAGNOSIS — Z794 Long term (current) use of insulin: Secondary | ICD-10-CM | POA: Diagnosis not present

## 2017-02-08 DIAGNOSIS — E1142 Type 2 diabetes mellitus with diabetic polyneuropathy: Secondary | ICD-10-CM | POA: Diagnosis not present

## 2017-02-08 DIAGNOSIS — N183 Chronic kidney disease, stage 3 (moderate): Secondary | ICD-10-CM | POA: Diagnosis not present

## 2017-02-08 DIAGNOSIS — I10 Essential (primary) hypertension: Secondary | ICD-10-CM | POA: Diagnosis not present

## 2017-02-08 DIAGNOSIS — E113293 Type 2 diabetes mellitus with mild nonproliferative diabetic retinopathy without macular edema, bilateral: Secondary | ICD-10-CM | POA: Diagnosis not present

## 2017-02-22 DIAGNOSIS — H26491 Other secondary cataract, right eye: Secondary | ICD-10-CM | POA: Diagnosis not present

## 2017-02-22 DIAGNOSIS — E113591 Type 2 diabetes mellitus with proliferative diabetic retinopathy without macular edema, right eye: Secondary | ICD-10-CM | POA: Diagnosis not present

## 2017-02-22 DIAGNOSIS — E113512 Type 2 diabetes mellitus with proliferative diabetic retinopathy with macular edema, left eye: Secondary | ICD-10-CM | POA: Diagnosis not present

## 2017-02-22 DIAGNOSIS — H4052X3 Glaucoma secondary to other eye disorders, left eye, severe stage: Secondary | ICD-10-CM | POA: Diagnosis not present

## 2017-02-22 LAB — HM DIABETES EYE EXAM

## 2017-03-07 DIAGNOSIS — I10 Essential (primary) hypertension: Secondary | ICD-10-CM | POA: Diagnosis not present

## 2017-03-07 DIAGNOSIS — E1129 Type 2 diabetes mellitus with other diabetic kidney complication: Secondary | ICD-10-CM | POA: Diagnosis not present

## 2017-03-07 DIAGNOSIS — N183 Chronic kidney disease, stage 3 (moderate): Secondary | ICD-10-CM | POA: Diagnosis not present

## 2017-03-07 DIAGNOSIS — R600 Localized edema: Secondary | ICD-10-CM | POA: Diagnosis not present

## 2017-03-07 LAB — BASIC METABOLIC PANEL
BUN: 24 — AB (ref 4–21)
Creatinine: 1.4 — AB (ref 0.6–1.3)
Glucose: 125
Potassium: 4.2 (ref 3.4–5.3)
Sodium: 141 (ref 137–147)

## 2017-03-07 LAB — CBC AND DIFFERENTIAL
HCT: 48 (ref 41–53)
Hemoglobin: 16.2 (ref 13.5–17.5)
Neutrophils Absolute: 3400
Platelets: 175 (ref 150–399)
WBC: 6.5

## 2017-03-15 ENCOUNTER — Encounter: Payer: Self-pay | Admitting: Family Medicine

## 2017-03-15 NOTE — Progress Notes (Signed)
[employer]

## 2017-03-15 NOTE — Progress Notes (Signed)
[id number] [facility].

## 2017-04-05 DIAGNOSIS — E113393 Type 2 diabetes mellitus with moderate nonproliferative diabetic retinopathy without macular edema, bilateral: Secondary | ICD-10-CM | POA: Diagnosis not present

## 2017-04-05 DIAGNOSIS — H401131 Primary open-angle glaucoma, bilateral, mild stage: Secondary | ICD-10-CM | POA: Diagnosis not present

## 2017-04-19 DIAGNOSIS — E1149 Type 2 diabetes mellitus with other diabetic neurological complication: Secondary | ICD-10-CM | POA: Diagnosis not present

## 2017-04-19 DIAGNOSIS — S51012A Laceration without foreign body of left elbow, initial encounter: Secondary | ICD-10-CM | POA: Diagnosis not present

## 2017-04-19 DIAGNOSIS — I5030 Unspecified diastolic (congestive) heart failure: Secondary | ICD-10-CM | POA: Diagnosis not present

## 2017-04-19 DIAGNOSIS — E781 Pure hyperglyceridemia: Secondary | ICD-10-CM | POA: Diagnosis not present

## 2017-04-19 DIAGNOSIS — I10 Essential (primary) hypertension: Secondary | ICD-10-CM | POA: Diagnosis not present

## 2017-04-19 DIAGNOSIS — N183 Chronic kidney disease, stage 3 (moderate): Secondary | ICD-10-CM | POA: Diagnosis not present

## 2017-05-31 DIAGNOSIS — H3562 Retinal hemorrhage, left eye: Secondary | ICD-10-CM | POA: Diagnosis not present

## 2017-05-31 DIAGNOSIS — E113591 Type 2 diabetes mellitus with proliferative diabetic retinopathy without macular edema, right eye: Secondary | ICD-10-CM | POA: Diagnosis not present

## 2017-05-31 DIAGNOSIS — E113512 Type 2 diabetes mellitus with proliferative diabetic retinopathy with macular edema, left eye: Secondary | ICD-10-CM | POA: Diagnosis not present

## 2017-05-31 DIAGNOSIS — H4052X3 Glaucoma secondary to other eye disorders, left eye, severe stage: Secondary | ICD-10-CM | POA: Diagnosis not present

## 2017-06-07 DIAGNOSIS — E1122 Type 2 diabetes mellitus with diabetic chronic kidney disease: Secondary | ICD-10-CM | POA: Diagnosis not present

## 2017-06-07 DIAGNOSIS — N183 Chronic kidney disease, stage 3 (moderate): Secondary | ICD-10-CM | POA: Diagnosis not present

## 2017-06-07 DIAGNOSIS — Z794 Long term (current) use of insulin: Secondary | ICD-10-CM | POA: Diagnosis not present

## 2017-06-12 ENCOUNTER — Emergency Department: Payer: PPO

## 2017-06-12 ENCOUNTER — Other Ambulatory Visit: Payer: Self-pay

## 2017-06-12 ENCOUNTER — Emergency Department
Admission: EM | Admit: 2017-06-12 | Discharge: 2017-06-12 | Disposition: A | Discharge status: Home / Self Care | Payer: PPO | Attending: Emergency Medicine | Admitting: Emergency Medicine

## 2017-06-12 ENCOUNTER — Encounter: Payer: Self-pay | Admitting: Emergency Medicine

## 2017-06-12 DIAGNOSIS — Y939 Activity, unspecified: Secondary | ICD-10-CM | POA: Diagnosis not present

## 2017-06-12 DIAGNOSIS — S8002XA Contusion of left knee, initial encounter: Secondary | ICD-10-CM | POA: Diagnosis not present

## 2017-06-12 DIAGNOSIS — R001 Bradycardia, unspecified: Secondary | ICD-10-CM | POA: Diagnosis not present

## 2017-06-12 DIAGNOSIS — I5032 Chronic diastolic (congestive) heart failure: Secondary | ICD-10-CM | POA: Diagnosis not present

## 2017-06-12 DIAGNOSIS — Y92009 Unspecified place in unspecified non-institutional (private) residence as the place of occurrence of the external cause: Secondary | ICD-10-CM | POA: Diagnosis not present

## 2017-06-12 DIAGNOSIS — S8992XA Unspecified injury of left lower leg, initial encounter: Secondary | ICD-10-CM | POA: Diagnosis present

## 2017-06-12 DIAGNOSIS — Z794 Long term (current) use of insulin: Secondary | ICD-10-CM | POA: Diagnosis not present

## 2017-06-12 DIAGNOSIS — F17220 Nicotine dependence, chewing tobacco, uncomplicated: Secondary | ICD-10-CM | POA: Insufficient documentation

## 2017-06-12 DIAGNOSIS — I130 Hypertensive heart and chronic kidney disease with heart failure and stage 1 through stage 4 chronic kidney disease, or unspecified chronic kidney disease: Secondary | ICD-10-CM | POA: Insufficient documentation

## 2017-06-12 DIAGNOSIS — W010XXA Fall on same level from slipping, tripping and stumbling without subsequent striking against object, initial encounter: Secondary | ICD-10-CM | POA: Diagnosis not present

## 2017-06-12 DIAGNOSIS — E1122 Type 2 diabetes mellitus with diabetic chronic kidney disease: Secondary | ICD-10-CM | POA: Diagnosis not present

## 2017-06-12 DIAGNOSIS — N189 Chronic kidney disease, unspecified: Secondary | ICD-10-CM | POA: Insufficient documentation

## 2017-06-12 DIAGNOSIS — M25562 Pain in left knee: Secondary | ICD-10-CM

## 2017-06-12 DIAGNOSIS — Y999 Unspecified external cause status: Secondary | ICD-10-CM | POA: Diagnosis not present

## 2017-06-12 DIAGNOSIS — Z7982 Long term (current) use of aspirin: Secondary | ICD-10-CM | POA: Insufficient documentation

## 2017-06-12 DIAGNOSIS — E1140 Type 2 diabetes mellitus with diabetic neuropathy, unspecified: Secondary | ICD-10-CM | POA: Insufficient documentation

## 2017-06-12 DIAGNOSIS — M25461 Effusion, right knee: Secondary | ICD-10-CM | POA: Diagnosis not present

## 2017-06-12 DIAGNOSIS — S80211A Abrasion, right knee, initial encounter: Secondary | ICD-10-CM | POA: Diagnosis not present

## 2017-06-12 DIAGNOSIS — S60512A Abrasion of left hand, initial encounter: Secondary | ICD-10-CM | POA: Diagnosis not present

## 2017-06-12 LAB — CBC
HCT: 51.7 % (ref 40.0–52.0)
Hemoglobin: 17.4 g/dL (ref 13.0–18.0)
MCH: 29.5 pg (ref 26.0–34.0)
MCHC: 33.7 g/dL (ref 32.0–36.0)
MCV: 87.7 fL (ref 80.0–100.0)
Platelets: 182 10*3/uL (ref 150–440)
RBC: 5.89 MIL/uL (ref 4.40–5.90)
RDW: 14.7 % — ABNORMAL HIGH (ref 11.5–14.5)
WBC: 9.2 10*3/uL (ref 3.8–10.6)

## 2017-06-12 LAB — URINALYSIS, COMPLETE (UACMP) WITH MICROSCOPIC
Bacteria, UA: NONE SEEN
Bilirubin Urine: NEGATIVE
Glucose, UA: 50 mg/dL — AB
Ketones, ur: NEGATIVE mg/dL
Leukocytes, UA: NEGATIVE
Nitrite: NEGATIVE
Protein, ur: 30 mg/dL — AB
Specific Gravity, Urine: 1.011 (ref 1.005–1.030)
pH: 5 (ref 5.0–8.0)

## 2017-06-12 LAB — BASIC METABOLIC PANEL
Anion gap: 10 (ref 5–15)
BUN: 26 mg/dL — ABNORMAL HIGH (ref 6–20)
CO2: 24 mmol/L (ref 22–32)
Calcium: 9.1 mg/dL (ref 8.9–10.3)
Chloride: 105 mmol/L (ref 101–111)
Creatinine, Ser: 1.45 mg/dL — ABNORMAL HIGH (ref 0.61–1.24)
GFR calc Af Amer: 50 mL/min — ABNORMAL LOW (ref >60)
GFR calc non Af Amer: 43 mL/min — ABNORMAL LOW (ref >60)
Glucose, Bld: 135 mg/dL — ABNORMAL HIGH (ref 65–99)
Potassium: 3.7 mmol/L (ref 3.5–5.1)
Sodium: 139 mmol/L (ref 135–145)

## 2017-06-12 LAB — HEMOGLOBIN A1C: Hemoglobin A1C: 9

## 2017-06-12 NOTE — ED Notes (Signed)
Pt taken to POV in [place]. VSS. NAD. Discharge instructions, and follow up reviewed. All questions addressed.

## 2017-06-12 NOTE — ED Notes (Signed)
Patient transported to X-ray 

## 2017-06-12 NOTE — ED Notes (Signed)
Pt unable to urinate at this moment, given specimen cup for when able to void. Pt sent back out to the lobby with daughter.

## 2017-06-12 NOTE — ED Triage Notes (Signed)
Fell yesterday.  He is not sure why.  Says he did not pass out or lose consciousness.  Lives by self.  Has swollen right knee, bandaid on left palm.  Bruising on arms and right hand.

## 2017-06-12 NOTE — Discharge Instructions (Addendum)
Please seek medical attention for any high fevers, chest pain, shortness of breath, change in behavior, persistent vomiting, bloody stool or any other new or concerning symptoms.  

## 2017-06-12 NOTE — ED Provider Notes (Signed)
[facility] Emergency Department Provider Note   ____________________________________________   I have reviewed the triage vital signs and the nursing notes.   HISTORY  Chief Complaint Right knee pain  History limited by: Not Limited   HPI Damon [name] is a 84 y.o. male who presents to the emergency department today because of concern for right knee pain after a fall. The patient fell yesterday. He is not sure why he fell. He does not recall tripping on anything. He also does not recall having any chest pain or palpitations when this happened. He did fall onto his right knee. Also fell onto his left hand. He denies any significant pain in his left wrist. Denies hitting his head.   Per medical record review patient has a history of CHF, DM.  Past Medical History:  Diagnosis Date  . Arrhythmia    Baseline bradycardia  . Bradycardia   . CHF (congestive heart failure) ([place])   . Chronic kidney disease    BAseline creatinine 1.4 to 1.5  . Diabetes mellitus   . Hard of hearing   . Hypertension     Patient Active Problem List   Diagnosis Date Noted  . 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 Visit for well man health check 08/07/2016  . Mood swings 10/11/2015  . Chronic renal disease 09/11/2013  . Hypoglycemia 09/11/2013  . Diastolic CHF ([place]) 88/50/2774  . Hyperlipidemia 09/09/2013  . 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 DIABETIC PERIPHERAL NEUROPATHY 12/04/2005  . OBESITY 12/04/2005  . Essential hypertension 12/04/2005    Past Surgical History:  Procedure Laterality Date  . COLONOSCOPY  01/01/2012   Procedure: COLONOSCOPY;  Surgeon: [name], MD;  Location[facility] ENDOSCOPY;  Service: Endoscopy;  Laterality: N/A;  . PARATHYROIDECTOMY    . TONSILLECTOMY      Prior to Admission medications   Medication Sig Start Date End Date Taking? Authorizing Provider  amLODipine (NORVASC) 5 MG tablet Take 1 tablet by  mouth 2 (two) times daily.    [provider]  aspirin 81 MG tablet Take 81 mg by mouth daily.      [provider]  furosemide (LASIX) 40 MG tablet TAKE 1 TABLET BY MOUTH EVERY OTHER DAY 08/13/16   Lucille Passy, MD  gemfibrozil (LOPID) 600 MG tablet TAKE 1 TABLET BY MOUTH TWICE DAILY BEFORE MEAL(S) 10/08/16   Lucille Passy, MD  glucose blood test strip Use as instructed 04/04/15   Lucille Passy, MD  insulin NPH-regular Human (NOVOLIN 70/30) (70-30) 100 UNIT/ML injection Inject 28 units before breakfast and 38 units before supper 07/20/16   [provider]  Insulin Syringe-Needle U-100 (B-D INS SYRINGE 0.5CC/30GX1/2") 30G X 1/2" 0.5 ML MISC Use to inject insulin twice a day. 04/04/15   Lucille Passy, MD  lisinopril (PRINIVIL,ZESTRIL) 2.5 MG tablet Take 1 tablet (2.5 mg total) by mouth daily. 03/28/15   Lucille Passy, MD  Omega-3 Fatty Acids (FISH OIL PO) Take 1 capsule by mouth daily.    [provider]  potassium chloride SA (K-DUR,KLOR-CON) 20 MEQ tablet Take 20 mEq by mouth every other day. Reported on 03/14/2015    [provider]  tamsulosin (FLOMAX) 0.4 MG CAPS capsule Take 0.4 mg by mouth daily. 10/26/12   [provider]  Vitamin D, Ergocalciferol, (DRISDOL) 50000 units CAPS capsule TAKE 1 CAPSULE BY MOUTH ONCE A WEEK 10/08/16   Lucille Passy, MD    Allergies  Penicillins  Family History  Problem Relation Age of Onset  . Diabetes type II Mother   . Hypertension Mother   . Diabetes type II Father   . Hypertension Father     Social History Social History   Tobacco Use  . Smoking status: Never Smoker  . Smokeless tobacco: Former Systems developer    Types: Chew  . Tobacco comment: chewing more than 40 years/chew 1PPD  Substance Use Topics  . Alcohol use: No    Alcohol/week: 0.0 oz  . Drug use: No    Review of Systems Constitutional: No fever/chills Eyes: No visual changes. ENT: No sore throat. Cardiovascular: Denies chest  pain. Respiratory: Denies shortness of breath. Gastrointestinal: No abdominal pain.  No nausea, no vomiting.  No diarrhea.   Genitourinary: Negative for dysuria. Musculoskeletal: Right knee pain. Skin: Abrasions to left palm, right knee. Neurological: Negative for headaches, focal weakness or numbness.  ____________________________________________   PHYSICAL EXAM:  VITAL SIGNS: ED Triage Vitals  Enc Vitals Group     BP 06/12/17 1000 (!) 149/53     Pulse Rate 06/12/17 1000 (!) 49     Resp 06/12/17 1000 18     Temp 06/12/17 1000 97.7 F (36.5 C)     Temp Source 06/12/17 1000 Oral     SpO2 06/12/17 1000 96 %     Weight 06/12/17 1001 223 lb (101.2 kg)     Height 06/12/17 1001 5\' 5"  (1.651 m)     Head Circumference --      Peak Flow --      Pain Score 06/12/17 1004 0   Constitutional: Alert and oriented. Well appearing and in no distress. Eyes: Conjunctivae are normal.  ENT   Head: Normocephalic and atraumatic.   Nose: No congestion/rhinnorhea.   Mouth/Throat: Mucous membranes are moist.   Neck: No stridor. Hematological/Lymphatic/Immunilogical: No cervical lymphadenopathy. Cardiovascular: Normal rate, regular rhythm.  No murmurs, rubs, or gallops.  Respiratory: Normal respiratory effort without tachypnea nor retractions. Breath sounds are clear and equal bilaterally. No wheezes/rales/rhonchi. Gastrointestinal: Soft and non tender. No rebound. No guarding.  Genitourinary: Deferred Musculoskeletal: Right knee with some swelling, tender to palpation. Left wrist without deformity or tenderness to palpation or movement.  Neurologic:  Normal speech and language. No gross focal neurologic deficits are appreciated.  Skin:  Abrasions to left palm and right knee. Psychiatric: Mood and affect are normal. Speech and behavior are normal. Patient exhibits appropriate insight and judgment.  ____________________________________________    LABS (pertinent  positives/negatives)  BMP na 139, k 3.7, glu 135, cr 1.45 CBC wbc 9.2, hgb 17.4, plt 182 UA not consistent with infection ____________________________________________   EKG  I, Nance Pear, attending physician, personally viewed and interpreted this EKG  EKG Time: 1021 Rate: 50 Rhythm: sinus bradycardia with 1st degree av block Axis: left axis deviation Intervals: qtc 432 QRS: LBBB ST changes: no st elevation Impression: abnormal ekg   ____________________________________________    RADIOLOGY  Right knee Joint effusion no fracture  ____________________________________________   PROCEDURES  Procedures  ____________________________________________   INITIAL IMPRESSION / ASSESSMENT AND PLAN / ED COURSE  Pertinent labs & imaging results that were available during my care of the patient were reviewed by me and considered in my medical decision making (see chart for details).  Patient presented to the emergency department today because of concerns of left knee pain after a fall.  On exam there is some bruising and tenderness to the left knee.  X-ray however did not show  any fracture dislocation.  Did seem a small effusion.  Discussed this finding with patient.  Discussed importance of orthopedic follow-up.  Discussed rice.  Patient also blood work checked given fall.  No concerning signs of anemia or electrolyte abnormality.  Did discuss use of assistance devices like walkers and canes.  Patient states he has both at his house.   ____________________________________________   FINAL CLINICAL IMPRESSION(S) / ED DIAGNOSES  Final diagnoses:  Acute pain of left knee     Note: This dictation was prepared with Dragon dictation. Any transcriptional errors that result from this process are unintentional     Nance Pear, MD 06/12/17 1554

## 2017-06-14 DIAGNOSIS — E1165 Type 2 diabetes mellitus with hyperglycemia: Secondary | ICD-10-CM | POA: Diagnosis not present

## 2017-06-14 DIAGNOSIS — Z794 Long term (current) use of insulin: Secondary | ICD-10-CM | POA: Diagnosis not present

## 2017-06-14 DIAGNOSIS — N183 Chronic kidney disease, stage 3 (moderate): Secondary | ICD-10-CM | POA: Diagnosis not present

## 2017-06-14 DIAGNOSIS — E113293 Type 2 diabetes mellitus with mild nonproliferative diabetic retinopathy without macular edema, bilateral: Secondary | ICD-10-CM | POA: Diagnosis not present

## 2017-06-14 DIAGNOSIS — E1142 Type 2 diabetes mellitus with diabetic polyneuropathy: Secondary | ICD-10-CM | POA: Diagnosis not present

## 2017-06-14 DIAGNOSIS — E1122 Type 2 diabetes mellitus with diabetic chronic kidney disease: Secondary | ICD-10-CM | POA: Diagnosis not present

## 2017-07-08 DIAGNOSIS — R600 Localized edema: Secondary | ICD-10-CM | POA: Diagnosis not present

## 2017-07-08 DIAGNOSIS — I10 Essential (primary) hypertension: Secondary | ICD-10-CM | POA: Diagnosis not present

## 2017-07-08 DIAGNOSIS — N183 Chronic kidney disease, stage 3 (moderate): Secondary | ICD-10-CM | POA: Diagnosis not present

## 2017-07-08 DIAGNOSIS — E1129 Type 2 diabetes mellitus with other diabetic kidney complication: Secondary | ICD-10-CM | POA: Diagnosis not present

## 2017-08-29 DIAGNOSIS — B351 Tinea unguium: Secondary | ICD-10-CM | POA: Diagnosis not present

## 2017-08-29 DIAGNOSIS — E1140 Type 2 diabetes mellitus with diabetic neuropathy, unspecified: Secondary | ICD-10-CM | POA: Diagnosis not present

## 2017-08-29 DIAGNOSIS — L851 Acquired keratosis [keratoderma] palmaris et plantaris: Secondary | ICD-10-CM | POA: Diagnosis not present

## 2017-08-29 DIAGNOSIS — Z794 Long term (current) use of insulin: Secondary | ICD-10-CM | POA: Diagnosis not present

## 2017-09-08 ENCOUNTER — Other Ambulatory Visit: Payer: Self-pay | Admitting: Family Medicine

## 2017-09-11 DIAGNOSIS — N183 Chronic kidney disease, stage 3 (moderate): Secondary | ICD-10-CM | POA: Diagnosis not present

## 2017-09-11 DIAGNOSIS — I129 Hypertensive chronic kidney disease with stage 1 through stage 4 chronic kidney disease, or unspecified chronic kidney disease: Secondary | ICD-10-CM | POA: Diagnosis not present

## 2017-09-11 DIAGNOSIS — E1129 Type 2 diabetes mellitus with other diabetic kidney complication: Secondary | ICD-10-CM | POA: Diagnosis not present

## 2017-09-12 DIAGNOSIS — I10 Essential (primary) hypertension: Secondary | ICD-10-CM | POA: Diagnosis not present

## 2017-09-12 DIAGNOSIS — N183 Chronic kidney disease, stage 3 (moderate): Secondary | ICD-10-CM | POA: Diagnosis not present

## 2017-09-12 DIAGNOSIS — E1129 Type 2 diabetes mellitus with other diabetic kidney complication: Secondary | ICD-10-CM | POA: Diagnosis not present

## 2017-09-12 DIAGNOSIS — R609 Edema, unspecified: Secondary | ICD-10-CM | POA: Diagnosis not present

## 2017-10-11 DIAGNOSIS — H26491 Other secondary cataract, right eye: Secondary | ICD-10-CM | POA: Diagnosis not present

## 2017-10-11 DIAGNOSIS — E113512 Type 2 diabetes mellitus with proliferative diabetic retinopathy with macular edema, left eye: Secondary | ICD-10-CM | POA: Diagnosis not present

## 2017-10-11 DIAGNOSIS — E113591 Type 2 diabetes mellitus with proliferative diabetic retinopathy without macular edema, right eye: Secondary | ICD-10-CM | POA: Diagnosis not present

## 2017-10-11 DIAGNOSIS — H4052X3 Glaucoma secondary to other eye disorders, left eye, severe stage: Secondary | ICD-10-CM | POA: Diagnosis not present

## 2017-10-20 ENCOUNTER — Other Ambulatory Visit: Payer: Self-pay | Admitting: Family Medicine

## 2017-11-08 DIAGNOSIS — E113511 Type 2 diabetes mellitus with proliferative diabetic retinopathy with macular edema, right eye: Secondary | ICD-10-CM | POA: Diagnosis not present

## 2017-12-04 ENCOUNTER — Encounter: Payer: Self-pay | Admitting: Family Medicine

## 2017-12-04 NOTE — Progress Notes (Signed)
[facility]/thx dmf

## 2017-12-05 DIAGNOSIS — B351 Tinea unguium: Secondary | ICD-10-CM | POA: Diagnosis not present

## 2017-12-05 DIAGNOSIS — L851 Acquired keratosis [keratoderma] palmaris et plantaris: Secondary | ICD-10-CM | POA: Diagnosis not present

## 2017-12-05 DIAGNOSIS — Z794 Long term (current) use of insulin: Secondary | ICD-10-CM | POA: Diagnosis not present

## 2017-12-05 DIAGNOSIS — E1140 Type 2 diabetes mellitus with diabetic neuropathy, unspecified: Secondary | ICD-10-CM | POA: Diagnosis not present

## 2017-12-06 DIAGNOSIS — H401131 Primary open-angle glaucoma, bilateral, mild stage: Secondary | ICD-10-CM | POA: Diagnosis not present

## 2017-12-06 DIAGNOSIS — E113393 Type 2 diabetes mellitus with moderate nonproliferative diabetic retinopathy without macular edema, bilateral: Secondary | ICD-10-CM | POA: Diagnosis not present

## 2017-12-09 DIAGNOSIS — E1122 Type 2 diabetes mellitus with diabetic chronic kidney disease: Secondary | ICD-10-CM | POA: Diagnosis not present

## 2017-12-09 DIAGNOSIS — E1149 Type 2 diabetes mellitus with other diabetic neurological complication: Secondary | ICD-10-CM | POA: Diagnosis not present

## 2017-12-09 DIAGNOSIS — N183 Chronic kidney disease, stage 3 (moderate): Secondary | ICD-10-CM | POA: Diagnosis not present

## 2017-12-09 DIAGNOSIS — Z794 Long term (current) use of insulin: Secondary | ICD-10-CM | POA: Diagnosis not present

## 2017-12-09 DIAGNOSIS — E1165 Type 2 diabetes mellitus with hyperglycemia: Secondary | ICD-10-CM | POA: Diagnosis not present

## 2017-12-13 DIAGNOSIS — H26491 Other secondary cataract, right eye: Secondary | ICD-10-CM | POA: Diagnosis not present

## 2017-12-13 DIAGNOSIS — E113552 Type 2 diabetes mellitus with stable proliferative diabetic retinopathy, left eye: Secondary | ICD-10-CM | POA: Diagnosis not present

## 2017-12-13 DIAGNOSIS — H4052X3 Glaucoma secondary to other eye disorders, left eye, severe stage: Secondary | ICD-10-CM | POA: Diagnosis not present

## 2017-12-13 DIAGNOSIS — E113591 Type 2 diabetes mellitus with proliferative diabetic retinopathy without macular edema, right eye: Secondary | ICD-10-CM | POA: Diagnosis not present

## 2017-12-27 DIAGNOSIS — N183 Chronic kidney disease, stage 3 (moderate): Secondary | ICD-10-CM | POA: Diagnosis not present

## 2017-12-27 DIAGNOSIS — Z794 Long term (current) use of insulin: Secondary | ICD-10-CM | POA: Diagnosis not present

## 2017-12-27 DIAGNOSIS — E1122 Type 2 diabetes mellitus with diabetic chronic kidney disease: Secondary | ICD-10-CM | POA: Diagnosis not present

## 2017-12-27 DIAGNOSIS — E1165 Type 2 diabetes mellitus with hyperglycemia: Secondary | ICD-10-CM | POA: Diagnosis not present

## 2017-12-27 DIAGNOSIS — E113293 Type 2 diabetes mellitus with mild nonproliferative diabetic retinopathy without macular edema, bilateral: Secondary | ICD-10-CM | POA: Diagnosis not present

## 2017-12-27 DIAGNOSIS — E1142 Type 2 diabetes mellitus with diabetic polyneuropathy: Secondary | ICD-10-CM | POA: Diagnosis not present

## 2018-01-09 DIAGNOSIS — L97521 Non-pressure chronic ulcer of other part of left foot limited to breakdown of skin: Secondary | ICD-10-CM | POA: Diagnosis not present

## 2018-01-31 DIAGNOSIS — N183 Chronic kidney disease, stage 3 (moderate): Secondary | ICD-10-CM | POA: Diagnosis not present

## 2018-01-31 DIAGNOSIS — I10 Essential (primary) hypertension: Secondary | ICD-10-CM | POA: Diagnosis not present

## 2018-01-31 DIAGNOSIS — Z0000 Encounter for general adult medical examination without abnormal findings: Secondary | ICD-10-CM | POA: Diagnosis not present

## 2018-01-31 DIAGNOSIS — E1129 Type 2 diabetes mellitus with other diabetic kidney complication: Secondary | ICD-10-CM | POA: Diagnosis not present

## 2018-01-31 DIAGNOSIS — E781 Pure hyperglyceridemia: Secondary | ICD-10-CM | POA: Diagnosis not present

## 2018-01-31 DIAGNOSIS — E1149 Type 2 diabetes mellitus with other diabetic neurological complication: Secondary | ICD-10-CM | POA: Diagnosis not present

## 2018-02-06 DIAGNOSIS — L97521 Non-pressure chronic ulcer of other part of left foot limited to breakdown of skin: Secondary | ICD-10-CM | POA: Diagnosis not present

## 2018-02-06 DIAGNOSIS — Z794 Long term (current) use of insulin: Secondary | ICD-10-CM | POA: Diagnosis not present

## 2018-02-06 DIAGNOSIS — E1140 Type 2 diabetes mellitus with diabetic neuropathy, unspecified: Secondary | ICD-10-CM | POA: Diagnosis not present

## 2018-02-17 DIAGNOSIS — M545 Low back pain: Secondary | ICD-10-CM | POA: Diagnosis not present

## 2018-02-17 DIAGNOSIS — E1149 Type 2 diabetes mellitus with other diabetic neurological complication: Secondary | ICD-10-CM | POA: Diagnosis not present

## 2018-02-17 DIAGNOSIS — R410 Disorientation, unspecified: Secondary | ICD-10-CM | POA: Diagnosis not present

## 2018-02-17 DIAGNOSIS — N183 Chronic kidney disease, stage 3 (moderate): Secondary | ICD-10-CM | POA: Diagnosis not present

## 2018-02-17 DIAGNOSIS — W010XXA Fall on same level from slipping, tripping and stumbling without subsequent striking against object, initial encounter: Secondary | ICD-10-CM | POA: Diagnosis not present

## 2018-02-17 DIAGNOSIS — Y92009 Unspecified place in unspecified non-institutional (private) residence as the place of occurrence of the external cause: Secondary | ICD-10-CM | POA: Diagnosis not present

## 2018-02-17 DIAGNOSIS — M47816 Spondylosis without myelopathy or radiculopathy, lumbar region: Secondary | ICD-10-CM | POA: Diagnosis not present

## 2018-02-17 DIAGNOSIS — I10 Essential (primary) hypertension: Secondary | ICD-10-CM | POA: Diagnosis not present

## 2018-02-17 DIAGNOSIS — M25521 Pain in right elbow: Secondary | ICD-10-CM | POA: Diagnosis not present

## 2018-02-17 DIAGNOSIS — S51011A Laceration without foreign body of right elbow, initial encounter: Secondary | ICD-10-CM | POA: Diagnosis not present

## 2018-02-18 ENCOUNTER — Other Ambulatory Visit: Payer: Self-pay | Admitting: Physician Assistant

## 2018-02-18 DIAGNOSIS — R531 Weakness: Secondary | ICD-10-CM

## 2018-02-19 ENCOUNTER — Encounter (INDEPENDENT_AMBULATORY_CARE_PROVIDER_SITE_OTHER): Payer: Self-pay

## 2018-02-19 ENCOUNTER — Ambulatory Visit
Admission: RE | Admit: 2018-02-19 | Discharge: 2018-02-19 | Disposition: A | Discharge status: Home / Self Care | Payer: PPO | Source: Ambulatory Visit | Attending: Physician Assistant | Admitting: Physician Assistant

## 2018-02-19 DIAGNOSIS — R531 Weakness: Secondary | ICD-10-CM | POA: Diagnosis not present

## 2018-03-03 ENCOUNTER — Other Ambulatory Visit: Payer: Self-pay

## 2018-03-03 ENCOUNTER — Inpatient Hospital Stay
Admission: AD | Admit: 2018-03-03 | Discharge: 2018-03-07 | DRG: 871 | Disposition: A | Discharge status: Home Health | Payer: PPO | Source: Ambulatory Visit | Attending: Internal Medicine | Admitting: Internal Medicine

## 2018-03-03 ENCOUNTER — Inpatient Hospital Stay: Payer: PPO

## 2018-03-03 DIAGNOSIS — J9811 Atelectasis: Secondary | ICD-10-CM | POA: Diagnosis present

## 2018-03-03 DIAGNOSIS — R32 Unspecified urinary incontinence: Secondary | ICD-10-CM | POA: Diagnosis present

## 2018-03-03 DIAGNOSIS — N170 Acute kidney failure with tubular necrosis: Secondary | ICD-10-CM | POA: Diagnosis present

## 2018-03-03 DIAGNOSIS — R319 Hematuria, unspecified: Secondary | ICD-10-CM

## 2018-03-03 DIAGNOSIS — M25551 Pain in right hip: Secondary | ICD-10-CM | POA: Diagnosis not present

## 2018-03-03 DIAGNOSIS — N189 Chronic kidney disease, unspecified: Secondary | ICD-10-CM | POA: Diagnosis not present

## 2018-03-03 DIAGNOSIS — N183 Chronic kidney disease, stage 3 unspecified: Secondary | ICD-10-CM

## 2018-03-03 DIAGNOSIS — Z880 Allergy status to penicillin: Secondary | ICD-10-CM | POA: Diagnosis not present

## 2018-03-03 DIAGNOSIS — R600 Localized edema: Secondary | ICD-10-CM | POA: Diagnosis present

## 2018-03-03 DIAGNOSIS — K529 Noninfective gastroenteritis and colitis, unspecified: Secondary | ICD-10-CM | POA: Diagnosis present

## 2018-03-03 DIAGNOSIS — N400 Enlarged prostate without lower urinary tract symptoms: Secondary | ICD-10-CM | POA: Diagnosis present

## 2018-03-03 DIAGNOSIS — E1142 Type 2 diabetes mellitus with diabetic polyneuropathy: Secondary | ICD-10-CM | POA: Diagnosis present

## 2018-03-03 DIAGNOSIS — M25552 Pain in left hip: Secondary | ICD-10-CM | POA: Diagnosis not present

## 2018-03-03 DIAGNOSIS — I509 Heart failure, unspecified: Secondary | ICD-10-CM | POA: Diagnosis present

## 2018-03-03 DIAGNOSIS — Z833 Family history of diabetes mellitus: Secondary | ICD-10-CM | POA: Diagnosis not present

## 2018-03-03 DIAGNOSIS — E785 Hyperlipidemia, unspecified: Secondary | ICD-10-CM | POA: Diagnosis present

## 2018-03-03 DIAGNOSIS — A419 Sepsis, unspecified organism: Principal | ICD-10-CM | POA: Diagnosis present

## 2018-03-03 DIAGNOSIS — E11319 Type 2 diabetes mellitus with unspecified diabetic retinopathy without macular edema: Secondary | ICD-10-CM | POA: Diagnosis present

## 2018-03-03 DIAGNOSIS — I130 Hypertensive heart and chronic kidney disease with heart failure and stage 1 through stage 4 chronic kidney disease, or unspecified chronic kidney disease: Secondary | ICD-10-CM | POA: Diagnosis present

## 2018-03-03 DIAGNOSIS — R52 Pain, unspecified: Secondary | ICD-10-CM

## 2018-03-03 DIAGNOSIS — N179 Acute kidney failure, unspecified: Secondary | ICD-10-CM | POA: Diagnosis not present

## 2018-03-03 DIAGNOSIS — Z87891 Personal history of nicotine dependence: Secondary | ICD-10-CM | POA: Diagnosis not present

## 2018-03-03 DIAGNOSIS — Z794 Long term (current) use of insulin: Secondary | ICD-10-CM | POA: Diagnosis not present

## 2018-03-03 DIAGNOSIS — Z8249 Family history of ischemic heart disease and other diseases of the circulatory system: Secondary | ICD-10-CM

## 2018-03-03 DIAGNOSIS — E860 Dehydration: Secondary | ICD-10-CM | POA: Diagnosis not present

## 2018-03-03 DIAGNOSIS — E872 Acidosis: Secondary | ICD-10-CM | POA: Diagnosis present

## 2018-03-03 DIAGNOSIS — R809 Proteinuria, unspecified: Secondary | ICD-10-CM | POA: Diagnosis not present

## 2018-03-03 DIAGNOSIS — I129 Hypertensive chronic kidney disease with stage 1 through stage 4 chronic kidney disease, or unspecified chronic kidney disease: Secondary | ICD-10-CM | POA: Diagnosis not present

## 2018-03-03 DIAGNOSIS — I10 Essential (primary) hypertension: Secondary | ICD-10-CM | POA: Diagnosis not present

## 2018-03-03 DIAGNOSIS — R10813 Right lower quadrant abdominal tenderness: Secondary | ICD-10-CM | POA: Diagnosis not present

## 2018-03-03 DIAGNOSIS — H9190 Unspecified hearing loss, unspecified ear: Secondary | ICD-10-CM | POA: Diagnosis present

## 2018-03-03 DIAGNOSIS — Z6838 Body mass index (BMI) 38.0-38.9, adult: Secondary | ICD-10-CM

## 2018-03-03 DIAGNOSIS — Z7982 Long term (current) use of aspirin: Secondary | ICD-10-CM | POA: Diagnosis not present

## 2018-03-03 DIAGNOSIS — E1122 Type 2 diabetes mellitus with diabetic chronic kidney disease: Secondary | ICD-10-CM | POA: Diagnosis present

## 2018-03-03 DIAGNOSIS — R350 Frequency of micturition: Secondary | ICD-10-CM | POA: Diagnosis not present

## 2018-03-03 DIAGNOSIS — Z79899 Other long term (current) drug therapy: Secondary | ICD-10-CM

## 2018-03-03 LAB — LACTIC ACID, PLASMA
Lactic Acid, Venous: 2.4 mmol/L (ref 0.5–1.9)
Lactic Acid, Venous: 2.4 mmol/L (ref 0.5–1.9)
Lactic Acid, Venous: 1.8 mmol/L (ref 0.5–1.9)

## 2018-03-03 LAB — CBC WITH DIFFERENTIAL/PLATELET
Abs Immature Granulocytes: 0.06 10*3/uL (ref 0.00–0.07)
Basophils Absolute: 0.1 10*3/uL (ref 0.0–0.1)
Basophils Relative: 0 %
Eosinophils Absolute: 0 10*3/uL (ref 0.0–0.5)
Eosinophils Relative: 0 %
HCT: 57.9 % — ABNORMAL HIGH (ref 39.0–52.0)
Hemoglobin: 18.6 g/dL — ABNORMAL HIGH (ref 13.0–17.0)
Immature Granulocytes: 0 %
Lymphocytes Relative: 8 %
Lymphs Abs: 1.2 10*3/uL (ref 0.7–4.0)
MCH: 29.2 pg (ref 26.0–34.0)
MCHC: 32.1 g/dL (ref 30.0–36.0)
MCV: 90.9 fL (ref 80.0–100.0)
Monocytes Absolute: 0.5 10*3/uL (ref 0.1–1.0)
Monocytes Relative: 4 %
Neutro Abs: 12 10*3/uL — ABNORMAL HIGH (ref 1.7–7.7)
Neutrophils Relative %: 88 %
Platelets: 203 10*3/uL (ref 150–400)
RBC: 6.37 MIL/uL — ABNORMAL HIGH (ref 4.22–5.81)
RDW: 15 % (ref 11.5–15.5)
WBC: 13.7 10*3/uL — ABNORMAL HIGH (ref 4.0–10.5)
nRBC: 0 % (ref 0.0–0.2)

## 2018-03-03 LAB — COMPREHENSIVE METABOLIC PANEL
ALT: 20 U/L (ref 0–44)
AST: 20 U/L (ref 15–41)
Albumin: 4.2 g/dL (ref 3.5–5.0)
Alkaline Phosphatase: 83 U/L (ref 38–126)
Anion gap: 19 — ABNORMAL HIGH (ref 5–15)
BUN: 48 mg/dL — ABNORMAL HIGH (ref 8–23)
CO2: 17 mmol/L — ABNORMAL LOW (ref 22–32)
Calcium: 9.1 mg/dL (ref 8.9–10.3)
Chloride: 106 mmol/L (ref 98–111)
Creatinine, Ser: 2.22 mg/dL — ABNORMAL HIGH (ref 0.61–1.24)
GFR calc Af Amer: 31 mL/min — ABNORMAL LOW (ref >60)
GFR calc non Af Amer: 27 mL/min — ABNORMAL LOW (ref >60)
Glucose, Bld: 161 mg/dL — ABNORMAL HIGH (ref 70–99)
Potassium: 4.5 mmol/L (ref 3.5–5.1)
Sodium: 142 mmol/L (ref 135–145)
Total Bilirubin: 1.4 mg/dL — ABNORMAL HIGH (ref 0.3–1.2)
Total Protein: 8.3 g/dL — ABNORMAL HIGH (ref 6.5–8.1)

## 2018-03-03 LAB — GLUCOSE, CAPILLARY
Glucose-Capillary: 156 mg/dL — ABNORMAL HIGH (ref 70–99)
Glucose-Capillary: 192 mg/dL — ABNORMAL HIGH (ref 70–99)
Glucose-Capillary: 283 mg/dL — ABNORMAL HIGH (ref 70–99)

## 2018-03-03 LAB — PROCALCITONIN: Procalcitonin: 0.2 ng/mL

## 2018-03-03 LAB — APTT: aPTT: 40 seconds — ABNORMAL HIGH (ref 24–36)

## 2018-03-03 LAB — PROTIME-INR
INR: 1.02
Prothrombin Time: 13.3 seconds (ref 11.4–15.2)

## 2018-03-03 MED ORDER — HYDROCODONE-ACETAMINOPHEN 5-325 MG PO TABS
1.0000 | ORAL_TABLET | ORAL | Status: DC | PRN
  Administered 2018-03-04 – 2018-03-07 (×3): 2 via ORAL
  Filled 2018-03-03 (×3): qty 2

## 2018-03-03 MED ORDER — IOPAMIDOL (ISOVUE-300) INJECTION 61%
15.0000 mL | INTRAVENOUS | Status: AC
  Administered 2018-03-03 (×2): 15 mL via ORAL

## 2018-03-03 MED ORDER — INSULIN ASPART 100 UNIT/ML ~~LOC~~ SOLN
0.0000 [IU] | Freq: Every day | SUBCUTANEOUS | Status: DC
  Administered 2018-03-03 – 2018-03-04 (×2): 3 [IU] via SUBCUTANEOUS
  Filled 2018-03-03 (×2): qty 1

## 2018-03-03 MED ORDER — SODIUM CHLORIDE 0.9 % IV SOLN
2.0000 g | INTRAVENOUS | Status: DC
  Filled 2018-03-03: qty 2

## 2018-03-03 MED ORDER — ALBUTEROL SULFATE (2.5 MG/3ML) 0.083% IN NEBU: 2.5000 mg | INHALATION_SOLUTION | RESPIRATORY_TRACT | Status: DC | PRN

## 2018-03-03 MED ORDER — HEPARIN SODIUM (PORCINE) 5000 UNIT/ML IJ SOLN
5000.0000 [IU] | Freq: Three times a day (TID) | INTRAMUSCULAR | Status: DC
  Administered 2018-03-03 – 2018-03-07 (×12): 5000 [IU] via SUBCUTANEOUS
  Filled 2018-03-03 (×12): qty 1

## 2018-03-03 MED ORDER — ACETAMINOPHEN 325 MG PO TABS: 650.0000 mg | ORAL_TABLET | Freq: Four times a day (QID) | ORAL | Status: DC | PRN

## 2018-03-03 MED ORDER — SODIUM CHLORIDE 0.9 % IV SOLN
INTRAVENOUS | Status: DC | PRN
  Administered 2018-03-03 – 2018-03-05 (×2): 250 mL via INTRAVENOUS

## 2018-03-03 MED ORDER — VANCOMYCIN HCL IN DEXTROSE 1-5 GM/200ML-% IV SOLN
1000.0000 mg | Freq: Once | INTRAVENOUS | Status: DC
  Filled 2018-03-03: qty 200

## 2018-03-03 MED ORDER — TAMSULOSIN HCL 0.4 MG PO CAPS
0.4000 mg | ORAL_CAPSULE | Freq: Every day | ORAL | Status: DC
  Administered 2018-03-03 – 2018-03-07 (×5): 0.4 mg via ORAL
  Filled 2018-03-03 (×5): qty 1

## 2018-03-03 MED ORDER — ASPIRIN EC 81 MG PO TBEC
81.0000 mg | DELAYED_RELEASE_TABLET | Freq: Every day | ORAL | Status: DC
  Administered 2018-03-03 – 2018-03-07 (×5): 81 mg via ORAL
  Filled 2018-03-03 (×7): qty 1

## 2018-03-03 MED ORDER — INSULIN ASPART 100 UNIT/ML ~~LOC~~ SOLN
0.0000 [IU] | Freq: Three times a day (TID) | SUBCUTANEOUS | Status: DC
  Administered 2018-03-03: 2 [IU] via SUBCUTANEOUS
  Administered 2018-03-04: 5 [IU] via SUBCUTANEOUS
  Administered 2018-03-04: 7 [IU] via SUBCUTANEOUS
  Administered 2018-03-04: 9 [IU] via SUBCUTANEOUS
  Administered 2018-03-05 (×2): 3 [IU] via SUBCUTANEOUS
  Administered 2018-03-05: 2 [IU] via SUBCUTANEOUS
  Administered 2018-03-06: 1 [IU] via SUBCUTANEOUS
  Administered 2018-03-06: 3 [IU] via SUBCUTANEOUS
  Administered 2018-03-06: 1 [IU] via SUBCUTANEOUS
  Filled 2018-03-03 (×10): qty 1

## 2018-03-03 MED ORDER — SODIUM CHLORIDE 0.9 % IV SOLN
INTRAVENOUS | Status: DC
  Administered 2018-03-03 – 2018-03-07 (×7): via INTRAVENOUS

## 2018-03-03 MED ORDER — VANCOMYCIN HCL IN DEXTROSE 1-5 GM/200ML-% IV SOLN
1000.0000 mg | INTRAVENOUS | Status: DC
  Filled 2018-03-03: qty 200

## 2018-03-03 MED ORDER — VANCOMYCIN HCL 10 G IV SOLR
2000.0000 mg | Freq: Once | INTRAVENOUS | Status: AC
  Administered 2018-03-03: 2000 mg via INTRAVENOUS
  Filled 2018-03-03: qty 2000

## 2018-03-03 MED ORDER — ONDANSETRON HCL 4 MG/2ML IJ SOLN: 4.0000 mg | Freq: Four times a day (QID) | INTRAMUSCULAR | Status: DC | PRN

## 2018-03-03 MED ORDER — METRONIDAZOLE IN NACL 5-0.79 MG/ML-% IV SOLN
500.0000 mg | Freq: Three times a day (TID) | INTRAVENOUS | Status: DC
  Administered 2018-03-03 – 2018-03-07 (×12): 500 mg via INTRAVENOUS
  Filled 2018-03-03 (×16): qty 100

## 2018-03-03 MED ORDER — ONDANSETRON HCL 4 MG PO TABS: 4.0000 mg | ORAL_TABLET | Freq: Four times a day (QID) | ORAL | Status: DC | PRN

## 2018-03-03 MED ORDER — BISACODYL 5 MG PO TBEC
5.0000 mg | DELAYED_RELEASE_TABLET | Freq: Every day | ORAL | Status: DC | PRN
  Administered 2018-03-07: 5 mg via ORAL
  Filled 2018-03-03: qty 1

## 2018-03-03 MED ORDER — ACETAMINOPHEN 650 MG RE SUPP: 650.0000 mg | Freq: Four times a day (QID) | RECTAL | Status: DC | PRN

## 2018-03-03 MED ORDER — SENNOSIDES-DOCUSATE SODIUM 8.6-50 MG PO TABS: 1.0000 | ORAL_TABLET | Freq: Every evening | ORAL | Status: DC | PRN

## 2018-03-03 MED ORDER — SODIUM CHLORIDE 0.9 % IV SOLN
2.0000 g | Freq: Once | INTRAVENOUS | Status: AC
  Administered 2018-03-03: 2 g via INTRAVENOUS
  Filled 2018-03-03: qty 2

## 2018-03-03 NOTE — Consult Note (Signed)
Pharmacy Antibiotic Note  Damon [name] is a 85 y.o. male admitted on 03/03/2018 with infection of unknown source.  Pharmacy has been consulted for cefepime and vancomycin dosing.  Plan: Cefepime 2 gm IV every 24 hours   Loading dose: Vancomycin 2000 mg IV once. Vancomycin 1000 mg IV Q 48 hrs. Goal AUC 400-550. Expected AUC: 497.7 SCr used: 2.22  Height: 5\' 2"  (157.5 cm) Weight: 212 lb 8.4 oz (96.4 kg) IBW/kg (Calculated) : 54.6  Temp (24hrs), Avg:97.7 F (36.5 C), Min:97.7 F (36.5 C), Max:97.7 F (36.5 C)  Recent Labs  Lab 03/03/18 1434 03/03/18 1627  WBC 13.7*  --   CREATININE 2.22*  --   LATICACIDVEN 2.4* 2.4*    Estimated Creatinine Clearance: 25.9 mL/min (A) (by C-G formula based on SCr of 2.22 mg/dL (H)).    Allergies  Allergen Reactions  . Penicillins Other (See Comments)    Reaction as a child    Antimicrobials this admission: Cefepime 2/10 >>  Vanco 2/10 >>  Metronidazole 2/10 >>  Dose adjustments this admission:   Microbiology results: 2/10 BCx: pending 2/10 UCx: ordered, pending collection  Thank you for allowing pharmacy to be a part of this patient's care.  Forrest Moron, PharmD Clinical Pharmacist 03/03/2018 5:59 PM

## 2018-03-03 NOTE — Progress Notes (Signed)
CRITICAL VALUE ALERT  Critical Value:  lactic acid 2.4  Date & Time Notied: lab notified RN at 1530 of critical value   Provider Notified: at 1545  Orders Received/Actions taken: No verbal orders given at this time   RN will continue to monitor pt.   Tempest Frankland [employer]

## 2018-03-03 NOTE — Progress Notes (Signed)
Advanced Care Plan.  Purpose of Encounter: CODE STATUS. Parties in Attendance: The patient, his daughter, RN and me. Patient's Decisional Capacity: Not sure. Medical Story: Damon [name]  is a 85 y.o. male with a known history of arrhythmia, bradycardia, CHF, CKD, hypertension, diabetes, hard hearing.  The patient is being admitted for sepsis, possible due to UTI.  I discussed with patient about his current condition, prognosis and CODE STATUS.  The patient stated that he wants to be resuscitated and intubated if he has cardiopulmonary rest.  But according to his daughter, his living will is partial code stating that he does not want to be intubated and put on machine.  I asked the patient twice, the patient still said that he wants to be resuscitated if he has cardiopulmonary arrest.  His daughter agrees. Plan:  Code Status: Full code.:  Time spent discussing advance care planning: 18 minutes.

## 2018-03-03 NOTE — H&P (Signed)
[place] [facility] [place] NAME: Damon [name]    MR#:  [id number]  DATE OF BIRTH:  August 25, 1933  DATE OF ADMISSION:  03/03/2018  PRIMARY CARE PHYSICIAN: Damon Hire, MD   REQUESTING/REFERRING PHYSICIAN: Dr. Edwina [name].  CHIEF COMPLAINT:  No chief complaint on file.  Sepsis. HISTORY OF PRESENT ILLNESS:  Damon [name]  is a 85 y.o. male with a known history of arrhythmia, bradycardia, CHF, CKD, hypertension, diabetes, hard hearing.  The patient is sent from his PCPs office for direct admission due to sepsis.  The patient has no complaints.  He looks like not a good historian.  According to his daughter, the patient has had urine frequency and urine dripping for the past few days.  In addition, the patient has poor oral intake. The patient went to PCPs office and found leukocytosis at 16,000, worsening renal function and and low systolic blood pressure, suspected sepsis.  Dr. Edwina [name] requested direct admission. PAST MEDICAL HISTORY:   Past Medical History:  Diagnosis Date  . Arrhythmia    Baseline bradycardia  . Bradycardia   . CHF (congestive heart failure) ([place])   . Chronic kidney disease    B[place]eline creatinine 1.4 to 1.5  . Diabetes mellitus   . Hard of hearing   . Hypertension     PAST SURGICAL HISTORY:   Past Surgical History:  Procedure Laterality Date  . COLONOSCOPY  01/01/2012   Procedure: COLONOSCOPY;  Surgeon: [name], MD;  Location: [facility] ENDOSCOPY;  Service: Endoscopy;  Laterality: N/A;  . PARATHYROIDECTOMY    . TONSILLECTOMY      SOCIAL HISTORY:   Social History   Tobacco Use  . Smoking status: Never Smoker  . Smokeless tobacco: Former Systems developer    Types: Chew  . Tobacco comment: chewing more than 40 years/chew 1PPD  Substance Use Topics  . Alcohol use: No    Alcohol/week: 0.0 standard drinks    FAMILY HISTORY:   Family History  Problem Relation Age of Onset  . Diabetes type II Mother   . Hypertension  Mother   . Diabetes type II Father   . Hypertension Father     DRUG ALLERGIES:   Allergies  Allergen Reactions  . Penicillins Other (See Comments)    Reaction as a child    REVIEW OF SYSTEMS:   Review of Systems  Constitutional: Negative for chills, fever and malaise/fatigue.  HENT: Positive for hearing loss. Negative for sore throat.   Eyes: Negative for blurred vision and double vision.  Respiratory: Negative for cough, hemoptysis, shortness of breath, wheezing and stridor.   Cardiovascular: Negative for chest pain, palpitations, orthopnea and leg swelling.  Gastrointestinal: Negative for abdominal pain, blood in stool, diarrhea, melena, nausea and vomiting.  Genitourinary: Positive for frequency and urgency. Negative for dysuria, flank pain and hematuria.  Musculoskeletal: Negative for back pain and joint pain.  Neurological: Negative for dizziness, sensory change, focal weakness, seizures, loss of consciousness, weakness and headaches.  Endo/Heme/Allergies: Negative for polydipsia.  Psychiatric/Behavioral: Negative for depression. The patient is not nervous/anxious.     MEDICATIONS AT HOME:   Prior to Admission medications   Medication Sig Start Date End Date Taking? Authorizing Provider  amLODipine (NORVASC) 5 MG tablet Take 1 tablet by mouth 2 (two) times daily.    [provider]  aspirin 81 MG tablet Take 81 mg by mouth daily.      [provider]  furosemide (LASIX) 40 MG tablet  TAKE 1 TABLET BY MOUTH EVERY OTHER DAY 08/13/16   Lucille Passy, MD  gemfibrozil (LOPID) 600 MG tablet TAKE 1 TABLET BY MOUTH TWICE DAILY BEFORE  MEALS 10/21/17   Lucille Passy, MD  glucose blood test strip Use as instructed 04/04/15   Lucille Passy, MD  insulin NPH-regular Human (NOVOLIN 70/30) (70-30) 100 UNIT/ML injection Inject 28 units before breakfast and 38 units before supper 07/20/16   [provider]  Insulin Syringe-Needle U-100 (B-D INS SYRINGE 0.5CC/30GX1/2")  30G X 1/2" 0.5 ML MISC Use to inject insulin twice a day. 04/04/15   Lucille Passy, MD  lisinopril (PRINIVIL,ZESTRIL) 2.5 MG tablet Take 1 tablet (2.5 mg total) by mouth daily. 03/28/15   Lucille Passy, MD  Omega-3 Fatty Acids (FISH OIL PO) Take 1 capsule by mouth daily.    [provider]  potassium chloride SA (K-DUR,KLOR-CON) 20 MEQ tablet Take 20 mEq by mouth every other day. Reported on 03/14/2015    [provider]  tamsulosin (FLOMAX) 0.4 MG CAPS capsule Take 0.4 mg by mouth daily. 10/26/12   [provider]  Vitamin D, Ergocalciferol, (DRISDOL) 50000 units CAPS capsule TAKE 1 CAPSULE BY MOUTH ONCE A WEEK 09/09/17   Lucille Passy, MD      VITAL SIGNS:  Blood pressure 133/73, pulse 87, temperature 97.7 F (36.5 C), temperature source Oral, resp. rate 20, SpO2 95 %.  PHYSICAL EXAMINATION:  Physical Exam  GENERAL:  85 y.o.-year-old patient lying in the bed with no acute distress.  EYES: Pupils equal, round, reactive to light and accommodation. No scleral icterus. Extraocular muscles intact.  HEENT: Head atraumatic, normocephalic. Oropharynx and nasopharynx clear.  NECK:  Supple, no jugular venous distention. No thyroid enlargement, no tenderness.  LUNGS: Normal breath sounds bilaterally, no wheezing, rales,rhonchi or crepitation. No use of accessory muscles of respiration.  CARDIOVASCULAR: S1, S2 normal. No murmurs, rubs, or gallops.  ABDOMEN: Soft, tenderness in lower part, distended abdomen. Bowel sounds present. No organomegaly or mass.  EXTREMITIES: No pedal edema, cyanosis, or clubbing.  NEUROLOGIC: Cranial nerves II through XII are intact. Muscle strength 5/5 in all extremities. Sensation intact. Gait not checked.  PSYCHIATRIC: The patient is alert and oriented x 3.  SKIN: No obvious rash, lesion, or ulcer.   LABORATORY PANEL:   CBC No results for input(s): WBC, HGB, HCT, PLT in the last 168  hours. ------------------------------------------------------------------------------------------------------------------  Chemistries  No results for input(s): NA, K, CL, CO2, GLUCOSE, BUN, CREATININE, CALCIUM, MG, AST, ALT, ALKPHOS, BILITOT in the last 168 hours.  Invalid input(s): GFRCGP ------------------------------------------------------------------------------------------------------------------  Cardiac Enzymes No results for input(s): TROPONINI in the last 168 hours. ------------------------------------------------------------------------------------------------------------------  RADIOLOGY:  No results found.    IMPRESSION AND PLAN:   Sepsis, unclear etiology, possible UTI. The patient is admitted to medical floor. Start IV antibiotics, follow-up CBC, urinalysis, blood culture and urine culture.  Follow-up chest x-ray and CT abdomen and pelvis.  Acute renal failure on CKD stage III.  IV fluid support and follow-up BMP. Hold lisinopril and Lasix.  Hypertension.  Hold hypertension medication due to low side blood pressure.  Diabetes.  Hold metformin and start sliding scale.  Morbid obesity.  Diet control and follow-up BMP.  All the records are reviewed and case discussed with ED provider. Management plans discussed with the patient, his daughter and they are in agreement.  CODE STATUS: Full code  TOTAL TIME TAKING CARE OF THIS PATIENT: 35 minutes.    Demetrios Loll M.D on  03/03/2018 at 2:03 PM  Between 7am to 6pm - Pager - [phone]  After 6pm go to [email or web address] - Proofreader  [facility]  Office  [phone]  CC: Primary care physician; Damon Hire, MD   Note: This dictation was prepared with Dragon dictation along with smaller phrase technology. Any transcriptional errors that result from this process are unin

## 2018-03-04 LAB — BASIC METABOLIC PANEL
Anion gap: 13 (ref 5–15)
BUN: 54 mg/dL — ABNORMAL HIGH (ref 8–23)
CO2: 18 mmol/L — ABNORMAL LOW (ref 22–32)
Calcium: 7.8 mg/dL — ABNORMAL LOW (ref 8.9–10.3)
Chloride: 103 mmol/L (ref 98–111)
Creatinine, Ser: 2.28 mg/dL — ABNORMAL HIGH (ref 0.61–1.24)
GFR calc Af Amer: 30 mL/min — ABNORMAL LOW (ref >60)
GFR calc non Af Amer: 26 mL/min — ABNORMAL LOW (ref >60)
Glucose, Bld: 372 mg/dL — ABNORMAL HIGH (ref 70–99)
Potassium: 4.5 mmol/L (ref 3.5–5.1)
Sodium: 134 mmol/L — ABNORMAL LOW (ref 135–145)

## 2018-03-04 LAB — CBC
HCT: 50.2 % (ref 39.0–52.0)
Hemoglobin: 17 g/dL (ref 13.0–17.0)
MCH: 29.3 pg (ref 26.0–34.0)
MCHC: 33.9 g/dL (ref 30.0–36.0)
MCV: 86.6 fL (ref 80.0–100.0)
Platelets: 174 10*3/uL (ref 150–400)
RBC: 5.8 MIL/uL (ref 4.22–5.81)
RDW: 14.2 % (ref 11.5–15.5)
WBC: 12.1 10*3/uL — ABNORMAL HIGH (ref 4.0–10.5)
nRBC: 0 % (ref 0.0–0.2)

## 2018-03-04 LAB — URINALYSIS, COMPLETE (UACMP) WITH MICROSCOPIC
Bacteria, UA: NONE SEEN
Bilirubin Urine: NEGATIVE
Glucose, UA: >=500 mg/dL — AB
Ketones, ur: NEGATIVE mg/dL
Leukocytes,Ua: NEGATIVE
Nitrite: NEGATIVE
Protein, ur: NEGATIVE mg/dL
Specific Gravity, Urine: 1.009 (ref 1.005–1.030)
pH: 5 (ref 5.0–8.0)

## 2018-03-04 LAB — GLUCOSE, CAPILLARY
Glucose-Capillary: 368 mg/dL — ABNORMAL HIGH (ref 70–99)
Glucose-Capillary: 330 mg/dL — ABNORMAL HIGH (ref 70–99)
Glucose-Capillary: 276 mg/dL — ABNORMAL HIGH (ref 70–99)
Glucose-Capillary: 277 mg/dL — ABNORMAL HIGH (ref 70–99)

## 2018-03-04 MED ORDER — SODIUM CHLORIDE 0.9 % IV SOLN
1.0000 g | Freq: Two times a day (BID) | INTRAVENOUS | Status: DC
  Administered 2018-03-04 – 2018-03-06 (×4): 1 g via INTRAVENOUS
  Filled 2018-03-04 (×5): qty 1

## 2018-03-04 MED ORDER — LATANOPROST 0.005 % OP SOLN
1.0000 [drp] | Freq: Every day | OPHTHALMIC | Status: DC
  Administered 2018-03-05 – 2018-03-06 (×2): 1 [drp] via OPHTHALMIC
  Filled 2018-03-04 (×2): qty 2.5

## 2018-03-04 MED ORDER — GEMFIBROZIL 600 MG PO TABS
600.0000 mg | ORAL_TABLET | Freq: Two times a day (BID) | ORAL | Status: DC
  Administered 2018-03-04 – 2018-03-06 (×5): 600 mg via ORAL
  Filled 2018-03-04 (×7): qty 1

## 2018-03-04 MED ORDER — HYDRALAZINE HCL 20 MG/ML IJ SOLN
10.0000 mg | Freq: Four times a day (QID) | INTRAMUSCULAR | Status: DC | PRN
  Administered 2018-03-07: 10 mg via INTRAVENOUS
  Filled 2018-03-04: qty 1

## 2018-03-04 MED ORDER — INSULIN ASPART PROT & ASPART (70-30 MIX) 100 UNIT/ML ~~LOC~~ SUSP
30.0000 [IU] | Freq: Two times a day (BID) | SUBCUTANEOUS | Status: DC
  Administered 2018-03-04 – 2018-03-07 (×6): 30 [IU] via SUBCUTANEOUS
  Filled 2018-03-04 (×6): qty 10

## 2018-03-04 MED ORDER — AMLODIPINE BESYLATE 5 MG PO TABS
5.0000 mg | ORAL_TABLET | Freq: Every day | ORAL | Status: DC
  Administered 2018-03-04 – 2018-03-07 (×4): 5 mg via ORAL
  Filled 2018-03-04 (×4): qty 1

## 2018-03-04 NOTE — Progress Notes (Signed)
[place] [facility] [place] NAME: Damon [name]    MR#:  [id number]  DATE OF BIRTH:  [birth year]  SUBJECTIVE:  CHIEF COMPLAINT: Patient sent as a direct admission from primary care physician's office for evaluation for possible sepsis.  This morning patient resting comfortably with family members present at bedside.  No fevers overnight.  REVIEW OF SYSTEMS:  ROS Unobtainable at this time due to medical condition.  Patient currently resting comfortably.  DRUG ALLERGIES:   Allergies  Allergen Reactions  . Penicillins Other (See Comments)    Reaction as a child   VITALS:  Blood pressure (!) 158/79, pulse 75, temperature 98.3 F (36.8 C), temperature source Oral, resp. rate (!) 24, height 5\' 2"  (1.575 m), weight 96.4 kg, SpO2 93 %. PHYSICAL EXAMINATION:   Physical Exam  Constitutional: He appears well-developed and well-nourished.  Resting comfortably  HENT:  Right Ear: External ear normal.  Left Ear: External ear normal.  Eyes: Pupils are equal, round, and reactive to light. Conjunctivae are normal. Right eye exhibits no discharge.  Neck: Neck supple. No tracheal deviation present.  Cardiovascular: Normal rate, regular rhythm and normal heart sounds.  Respiratory: Effort normal. No stridor. He has no wheezes.  GI: Soft. Bowel sounds are normal. He exhibits no distension.  Musculoskeletal:        General: No deformity or edema.  Neurological:  Resting comfortably at this time.  Full neuro exam not done  Skin: Skin is warm. He is not diaphoretic. No erythema.   LABORATORY PANEL:  Male CBC Recent Labs  Lab 03/04/18 0531  WBC 12.1*  HGB 17.0  HCT 50.2  PLT 174   ------------------------------------------------------------------------------------------------------------------ Chemistries  Recent Labs  Lab 03/03/18 1434 03/04/18 0531  NA 142 134*  K 4.5 4.5  CL 106 103  CO2 17* 18*  GLUCOSE 161* 372*  BUN 48* 54*    CREATININE 2.22* 2.28*  CALCIUM 9.1 7.8*  AST 20  --   ALT 20  --   ALKPHOS 83  --   BILITOT 1.4*  --    RADIOLOGY:  Ct Abdomen Pelvis Wo Contrast  Result Date: 03/03/2018 CLINICAL DATA:  85 year old hypertensive diabetic male with chronic kidney disease presented with possible sepsis/elevated white count. Urinary frequency. Initial encounter. EXAM: CT ABDOMEN AND PELVIS WITHOUT CONTRAST TECHNIQUE: Multidetector CT imaging of the abdomen and pelvis was performed following the standard protocol without IV contrast. COMPARISON:  No comparison CT. FINDINGS: Lower chest: Basilar subsegmental atelectasis/scarring and pleural thickening. Heart top-normal to slightly enlarged. Three vessel coronary artery calcification. Prominent epicardial fat pad. Hepatobiliary: Taking into account limitation by non contrast imaging, no worrisome hepatic lesion. Prominent size gallbladder without calcified gallstone or CT evidence of inflammation. Fatty infiltration of the pancreas. Taking into account limitation by non contrast imaging, no worrisome pancreatic mass. Spleen: Taking into account limitation by non contrast imaging, no splenic mass or enlargement. Adrenals/Urinary Tract: No obstructing stone or hydrocephalus. Possible small left renal cyst. Taking into account limitation by non contrast imaging, no worrisome renal or adrenal lesion. Markedly enlarged urinary bladder. No high obvious mass noted on this unenhanced exam. Superior displaced urinary bladder displaces surrounding vessels with minimal haziness along the superior margin. Stomach/Bowel: Prominent stool rectosigmoid region with mild circumferential thickening of the rectosigmoid wall. Minimal haziness of surrounding fat planes. No inflammation surrounds the appendix or terminal ileum. Small hiatal hernia. No gastric ulceration or mass identified. No small bowel abnormality detected. Vascular/Lymphatic: Atherosclerotic changes aorta  and aortic branch  vessels. No abdominal aortic aneurysm. Proper hepatic artery and splenic artery arise directly from the aorta. Scattered normal size lymph nodes. Reproductive: Enlarged prostate gland. This may contribute to bladder outlet obstruction. Other: No free air or bowel containing hernia. Musculoskeletal: Degenerative changes lower thoracic and lumbar spine. Findings most notable right aspect L3-4. Hip joint degenerative changes. IMPRESSION: 1. No obstructing stone or hydronephrosis. No definitive findings of pyelonephritis however this possibility is not adequately assessed without contrast administration. 2. Markedly enlarged urinary bladder. Minimal haziness along the superior aspect of the bladder. Enlarged prostate gland may contribute to bladder outlet obstruction. 3. Prominent stool rectosigmoid region with mild circumferential thickening of the rectosigmoid wall. Minimal haziness of surrounding fat planes. Patient may be at risk for development of stercoral colitis. 4. Small hiatal hernia. 5. Prominent size gallbladder without CT evidence of inflammation. 6. Aortic Atherosclerosis (ICD10-I70.0). Coronary artery calcification. 7. Degenerative changes lower thoracic and lumbar spine most notable on the right at the L3-4 level. Electronically Signed   By: Genia Del [name]   On: 03/03/2018 17:36   Dg Chest Port 1 View  Result Date: 03/03/2018 CLINICAL DATA:  Sepsis. EXAM: PORTABLE CHEST 1 VIEW COMPARISON:  Radiograph of August 24, 2013. FINDINGS: Stable cardiomegaly. Atherosclerosis of thoracic aorta is noted. No pneumothorax is noted. Mild bibasilar subsegmental atelectasis is noted. No significant pleural effusion is noted. Bony thorax is unremarkable. IMPRESSION: Mild bibasilar subsegmental atelectasis. Aortic Atherosclerosis (ICD10-I70.0). Electronically Signed   By: Marijo Conception, [name]   On: 03/03/2018 14:57   ASSESSMENT AND PLAN:    1. Sepsis; No source found yet. Urinalysis unremarkable.  Chest  x-ray unremarkable apart from evidence of atelectasis.  Blood cultures already requested. CT abdomen and pelvis without contrast with no definite findings of pyelonephritis.  No obstructing stone or hydronephrosis.  Prominent stool in the rectosigmoid region with mild circumferential thickening.  Patient may be at risk of development of stercoral colitis.  Daughter at bedside however denies patient complaining of any abdominal pains. Currently on empiric antibiotics with IV vancomycin and cefepime.  Will initiate de-escalation of antibiotics in a.m. if still no growth on cultures  2. Acute renal failure on CKD stage III.  IV fluid support and follow-up BMP. Hold lisinopril and Lasix.  Follow-up on renal function in a.m.  3.Hypertension.  Blood pressure trending up. Resumed Norvasc.  Placed on PRN IV hydralazine with parameters  4.  Diabetes mellitus type 2 Blood sugars trending up.  Metformin currently on hold. Resumed insulin Novolin 70/30 at 30 units subcu twice daily.  Monitor and adjust regimen as needed.  Glycosylated hemoglobin level in a.m.  5.  Morbid obesity.  BMI of 38.87. Encourage lifestyle modifications including weight loss  DVT prophylaxis; heparin   All the records are reviewed and case discussed with Care Management/Social Worker. Management plans discussed with the patient, family and they are in agreement.  CODE STATUS: Full Code  TOTAL TIME TAKING CARE OF THIS PATIENT: 37 minutes.   More than 50% of the time was spent in counseling/coordination of care: YES  POSSIBLE D/C IN 2 DAYS, DEPENDING ON CLINICAL CONDITION.   Shayann Garbutt M.D on 03/04/2018 at 1:35 PM  Between 7am to 6pm - Pager - [phone]  After 6pm go to [email or web address] - Proofreader  [facility] [place] [facility]  Office  [phone]  CC: Primary care physician; Baxter Hire, MD  Note: This dictation was prepared with Dragon dictation along with smaller phrase technology.  Any  transcriptional errors that result from this process are unintentional.

## 2018-03-04 NOTE — Progress Notes (Signed)
Inpatient Diabetes Program Recommendations  AACE/ADA: New Consensus Statement on Inpatient Glycemic Control (2015)  Target Ranges:  Prepandial:   less than 140 mg/dL      Peak postprandial:   less than 180 mg/dL (1-2 hours)      Critically ill patients:  140 - 180 mg/dL   Results for Damon [name], Damon [name] (MRN [id number]) as of 03/04/2018 08:34  Ref. Range 03/03/2018 14:57 03/03/2018 16:34 03/03/2018 21:31  Glucose-Capillary Latest Ref Range: 70 - 99 mg/dL 156 (H) 192 (H)  2 units NOVOLOG  283 (H)  3 units NOVOLOG    Results for Damon [name], Damon [name] (MRN [id number]) as of 03/04/2018 08:34  Ref. Range 03/04/2018 07:34  Glucose-Capillary Latest Ref Range: 70 - 99 mg/dL 368 (H)    Admit with: Sepsis  History: DM, CHF, CKD  Home DM Meds: 70/30 Insulin- 28 units AM/ 38 units PM       Metformin 500 mg Daily  Current Orders: Novolog Sensitive Correction Scale/ SSI (0-9 units) TID AC + HS      MD- Patient takes 70/30 Insulin BID at home.  If patient has good appetite, recommend we start at least 75% of his home doses of 70/30 Insulin today:  70/30 Insulin- 20 units AM/ 28 units PM (75% total home doses)      --Will follow patient during hospitalization--  Wyn Quaker RN, MSN, CDE Diabetes Coordinator Inpatient Glycemic Control Team Team Pager: [phone] (8a-5p)

## 2018-03-05 ENCOUNTER — Inpatient Hospital Stay: Payer: PPO

## 2018-03-05 LAB — CBC
HCT: 48 % (ref 39.0–52.0)
Hemoglobin: 16.1 g/dL (ref 13.0–17.0)
MCH: 29.3 pg (ref 26.0–34.0)
MCHC: 33.5 g/dL (ref 30.0–36.0)
MCV: 87.3 fL (ref 80.0–100.0)
Platelets: 167 10*3/uL (ref 150–400)
RBC: 5.5 MIL/uL (ref 4.22–5.81)
RDW: 14.3 % (ref 11.5–15.5)
WBC: 9.8 10*3/uL (ref 4.0–10.5)
nRBC: 0 % (ref 0.0–0.2)

## 2018-03-05 LAB — BASIC METABOLIC PANEL
Anion gap: 12 (ref 5–15)
BUN: 64 mg/dL — ABNORMAL HIGH (ref 8–23)
CO2: 19 mmol/L — ABNORMAL LOW (ref 22–32)
Calcium: 7.7 mg/dL — ABNORMAL LOW (ref 8.9–10.3)
Chloride: 107 mmol/L (ref 98–111)
Creatinine, Ser: 2.75 mg/dL — ABNORMAL HIGH (ref 0.61–1.24)
GFR calc Af Amer: 24 mL/min — ABNORMAL LOW (ref >60)
GFR calc non Af Amer: 21 mL/min — ABNORMAL LOW (ref >60)
Glucose, Bld: 242 mg/dL — ABNORMAL HIGH (ref 70–99)
Potassium: 4 mmol/L (ref 3.5–5.1)
Sodium: 138 mmol/L (ref 135–145)

## 2018-03-05 LAB — URINE CULTURE: Culture: NO GROWTH

## 2018-03-05 LAB — GLUCOSE, CAPILLARY
Glucose-Capillary: 236 mg/dL — ABNORMAL HIGH (ref 70–99)
Glucose-Capillary: 216 mg/dL — ABNORMAL HIGH (ref 70–99)
Glucose-Capillary: 155 mg/dL — ABNORMAL HIGH (ref 70–99)
Glucose-Capillary: 66 mg/dL — ABNORMAL LOW (ref 70–99)
Glucose-Capillary: 92 mg/dL (ref 70–99)

## 2018-03-05 LAB — MAGNESIUM: Magnesium: 2.4 mg/dL (ref 1.7–2.4)

## 2018-03-05 LAB — PHOSPHORUS: Phosphorus: 5.7 mg/dL — ABNORMAL HIGH (ref 2.5–4.6)

## 2018-03-05 LAB — HEMOGLOBIN A1C
Hgb A1c MFr Bld: 6.7 % — ABNORMAL HIGH (ref 4.8–5.6)
Mean Plasma Glucose: 145.59 mg/dL

## 2018-03-05 NOTE — Progress Notes (Signed)
[place] [facility] [place] NAME: Damon [name]    MR#:  [id number]  DATE OF BIRTH:  1933-06-07  SUBJECTIVE:  CHIEF COMPLAINT: Patient sent as a direct admission from primary care physician's office for evaluation for possible sepsis.  No new complaints this morning.  Patient remains afebrile.  Updated daughter present at bedside on treatment plans.Marland Kitchen  REVIEW OF SYSTEMS:  ROS Unobtainable at this time due to medical condition.  Patient currently resting comfortably.  DRUG ALLERGIES:   Allergies  Allergen Reactions  . Penicillins Other (See Comments)    Reaction as a child   VITALS:  Blood pressure (!) 142/86, pulse 70, temperature (!) 97.3 F (36.3 C), temperature source Oral, resp. rate 17, height 5\' 2"  (1.575 m), weight 96.4 kg, SpO2 98 %. PHYSICAL EXAMINATION:   Physical Exam  Constitutional: He appears well-developed and well-nourished.  HENT:  Right Ear: External ear normal.  Left Ear: External ear normal.  Eyes: Pupils are equal, round, and reactive to light. Conjunctivae are normal. Right eye exhibits no discharge.  Neck: Neck supple. No tracheal deviation present.  Cardiovascular: Normal rate, regular rhythm and normal heart sounds.  Respiratory: Effort normal. No stridor. He has no wheezes.  GI: Soft. Bowel sounds are normal. He exhibits no distension.  Musculoskeletal:        General: No deformity or edema.  Neurological:  Resting comfortably at this time.  Full neuro exam not done  Skin: Skin is warm. He is not diaphoretic. No erythema.   LABORATORY PANEL:  Male CBC Recent Labs  Lab 03/05/18 0404  WBC 9.8  HGB 16.1  HCT 48.0  PLT 167   ------------------------------------------------------------------------------------------------------------------ Chemistries  Recent Labs  Lab 03/03/18 1434  03/05/18 0404  NA 142   < > 138  K 4.5   < > 4.0  CL 106   < > 107  CO2 17*   < > 19*  GLUCOSE 161*   < > 242*  BUN  48*   < > 64*  CREATININE 2.22*   < > 2.75*  CALCIUM 9.1   < > 7.7*  MG  --   --  2.4  AST 20  --   --   ALT 20  --   --   ALKPHOS 83  --   --   BILITOT 1.4*  --   --    < > = values in this interval not displayed.   RADIOLOGY:  No results found. ASSESSMENT AND PLAN:    1. Sepsis; No source found yet. Urinalysis unremarkable.  Chest x-ray unremarkable apart from evidence of atelectasis.  Blood cultures so far with no growth.  Leukocytosis resolved.  CT abdomen and pelvis without contrast with no definite findings of pyelonephritis.  No obstructing stone or hydronephrosis.  Prominent stool in the rectosigmoid region with mild circumferential thickening.  Patient may be at risk of development of stercoral colitis.  Daughter at bedside however denies patient complaining of any abdominal pains. Currently on empiric antibiotics with IV vancomycin and cefepime and Flagyl.  Discontinued IV vancomycin due to worsening of renal function and since no growth on cultures so far.  Continue cefepime and Flagyl with plans to further de-escalate in a.m. if patient continues to improve clinically  2. Acute renal failure on CKD stage III.  IV fluid support and follow-up BMP. Hold lisinopril and Lasix.  Patient seen by nephrologist.  Appreciate input.  Noted further decline in renal function.  Renal ultrasound done  3.Hypertension.  Blood pressure trending up. Resumed Norvasc.  Placed on PRN IV hydralazine with parameters  4.  Diabetes mellitus type 2 Blood sugars trending up.  Metformin currently on hold. Resumed insulin Novolin 70/30 at 30 units subcu twice daily.  Monitor and adjust regimen as needed.  Glycosylated hemoglobin level in a.m.  5.  Morbid obesity.  BMI of 38.87. Encourage lifestyle modifications including weight loss  DVT prophylaxis; heparin   All the records are reviewed and case discussed with Care Management/Social Worker. Management plans discussed with the patient, family  and they are in agreement.  CODE STATUS: Full Code  TOTAL TIME TAKING CARE OF THIS PATIENT: 35 minutes.   More than 50% of the time was spent in counseling/coordination of care: YES  POSSIBLE D/C IN 2 DAYS, DEPENDING ON CLINICAL CONDITION. Physical therapist to evaluate and treat   Damon [name] M.D on 03/05/2018 at 2:35 PM  Between 7am to 6pm - Pager - [phone]  After 6pm go to [email or web address] - Proofreader  [facility] [place] [facility]  Office  [phone]  CC: Primary care physician; Damon Hire, MD  Note: This dictation was prepared with Dragon dictation along with smaller phrase technology. Any transcriptional errors that result from this process are unintentional.

## 2018-03-05 NOTE — Progress Notes (Signed)
[facility]  ROUNDING NOTE   Subjective:   Mr. NIKOS ANGLEMYER admitted to [facility] on 03/03/2018 for Sepsis  Two daughters at bedside who assist with history taking. Patient is a poor historian. Patient has been experiencing more falls and urinary incontinence in last few days. Went to see PCP and was sent to [facility].   Objective:  Vital signs in last 24 hours:  Temp:  [97.3 F (36.3 C)-98.3 F (36.8 C)] 97.3 F (36.3 C) (02/12 1158) Pulse Rate:  [70-73] 70 (02/12 1158) Resp:  [17] 17 (02/12 0446) BP: (135-145)/(68-86) 142/86 (02/12 1158) SpO2:  [95 %-98 %] 98 % (02/12 1158)  Weight change:  Filed Weights   03/03/18 1700  Weight: 96.4 kg    Intake/Output: I/O last 3 completed shifts: In: 4662.2 [P.O.:1080; I.V.:2598; IV Piggyback:984.2] Out: 200 [Urine:200]   Intake/Output this shift:  Total I/O In: 480 [P.O.:480] Out: -   Physical Exam: General: NAD,   Head: Normocephalic, atraumatic. Moist oral mucosal membranes  Eyes: Anicteric, PERRL  Neck: Supple, trachea midline  Lungs:  Clear to auscultation  Heart: Regular rate and rhythm  Abdomen:  Soft, nontender,   Extremities: no peripheral edema.  Neurologic: Alert to self and place  Skin: No lesions        Basic Metabolic Panel: Recent Labs  Lab 03/03/18 1434 03/04/18 0531 03/05/18 0404  NA 142 134* 138  K 4.5 4.5 4.0  CL 106 103 107  CO2 17* 18* 19*  GLUCOSE 161* 372* 242*  BUN 48* 54* 64*  CREATININE 2.22* 2.28* 2.75*  CALCIUM 9.1 7.8* 7.7*  MG  --   --  2.4  PHOS  --   --  5.7*    Liver Function Tests: Recent Labs  Lab 03/03/18 1434  AST 20  ALT 20  ALKPHOS 83  BILITOT 1.4*  PROT 8.3*  ALBUMIN 4.2   No results for input(s): LIPASE, AMYLASE in the last 168 hours. No results for input(s): AMMONIA in the last 168 hours.  CBC: Recent Labs  Lab 03/03/18 1434 03/04/18 0531 03/05/18 0404  WBC 13.7* 12.1* 9.8  NEUTROABS 12.0*  --   --   HGB 18.6* 17.0 16.1  HCT 57.9* 50.2 48.0   MCV 90.9 86.6 87.3  PLT 203 174 167    Cardiac Enzymes: No results for input(s): CKTOTAL, CKMB, CKMBINDEX, TROPONINI in the last 168 hours.  BNP: Invalid input(s): POCBNP  CBG: Recent Labs  Lab 03/04/18 1126 03/04/18 1632 03/04/18 2110 03/05/18 0739 03/05/18 1138  GLUCAP 330* 276* 277* 236* 216*    Microbiology: Results for orders placed or performed during the hospital encounter of 03/03/18  Culture, blood (x 2)     Status: None (Preliminary result)   Collection Time: 03/03/18  2:34 PM  Result Value Ref Range Status   Specimen Description BLOOD BLOOD LEFT HAND  Final   Special Requests   Final    BOTTLES DRAWN AEROBIC AND ANAEROBIC Blood Culture results may not be optimal due to an inadequate volume of blood received in culture bottles   Culture   Final    NO GROWTH 2 DAYS Performed at [facility], [address] [place], [place] [address]    Report Status PENDING  Incomplete  Culture, blood (x 2)     Status: None (Preliminary result)   Collection Time: 03/03/18  4:27 PM  Result Value Ref Range Status   Specimen Description BLOOD RIGHT [facility]  Final   Special Requests  Final    BOTTLES DRAWN AEROBIC AND ANAEROBIC Blood Culture results may not be optimal due to an inadequate volume of blood received in culture bottles   Culture   Final    NO GROWTH 2 DAYS Performed at [facility], [address], [place], [place] [address]    Report Status PENDING  Incomplete  Urine Culture     Status: None   Collection Time: 03/04/18 11:08 AM  Result Value Ref Range Status   Specimen Description   Final    URINE, RANDOM Performed at [facility], [address], [place], [place] [address]    Special Requests   Final    NONE Performed at [facility], [address], [place], [place] [address]    Culture   Final    NO GROWTH Performed at [facility], [place] [address], [place], [place] [address]    Report Status 03/05/2018 FINAL   Final    Coagulation Studies: Recent Labs    03/03/18 1434  LABPROT 13.3  INR 1.02    Urinalysis: Recent Labs    03/04/18 1108  COLORURINE YELLOW*  LABSPEC 1.009  PHURINE 5.0  GLUCOSEU >=500*  HGBUR LARGE*  BILIRUBINUR NEGATIVE  KETONESUR NEGATIVE  PROTEINUR NEGATIVE  NITRITE NEGATIVE  LEUKOCYTESUR NEGATIVE      Imaging: Ct Abdomen Pelvis Wo Contrast  Result Date: 03/03/2018 CLINICAL DATA:  85 year old hypertensive diabetic male with chronic kidney disease presented with possible sepsis/elevated white count. Urinary frequency. Initial encounter. EXAM: CT ABDOMEN AND PELVIS WITHOUT CONTRAST TECHNIQUE: Multidetector CT imaging of the abdomen and pelvis was performed following the standard protocol without IV contrast. COMPARISON:  No comparison CT. FINDINGS: Lower chest: Basilar subsegmental atelectasis/scarring and pleural thickening. Heart top-normal to slightly enlarged. Three vessel coronary artery calcification. Prominent epicardial fat pad. Hepatobiliary: Taking into account limitation by non contrast imaging, no worrisome hepatic lesion. Prominent size gallbladder without calcified gallstone or CT evidence of inflammation. Fatty infiltration of the pancreas. Taking into account limitation by non contrast imaging, no worrisome pancreatic mass. Spleen: Taking into account limitation by non contrast imaging, no splenic mass or enlargement. Adrenals/Urinary Tract: No obstructing stone or hydrocephalus. Possible small left renal cyst. Taking into account limitation by non contrast imaging, no worrisome renal or adrenal lesion. Markedly enlarged urinary bladder. No high obvious mass noted on this unenhanced exam. Superior displaced urinary bladder displaces surrounding vessels with minimal haziness along the superior margin. Stomach/Bowel: Prominent stool rectosigmoid region with mild circumferential thickening of the rectosigmoid wall. Minimal haziness of surrounding fat planes. No  inflammation surrounds the appendix or terminal ileum. Small hiatal hernia. No gastric ulceration or mass identified. No small bowel abnormality detected. Vascular/Lymphatic: Atherosclerotic changes aorta and aortic branch vessels. No abdominal aortic aneurysm. Proper hepatic artery and splenic artery arise directly from the aorta. Scattered normal size lymph nodes. Reproductive: Enlarged prostate gland. This may contribute to bladder outlet obstruction. Other: No free air or bowel containing hernia. Musculoskeletal: Degenerative changes lower thoracic and lumbar spine. Findings most notable right aspect L3-4. Hip joint degenerative changes. IMPRESSION: 1. No obstructing stone or hydronephrosis. No definitive findings of pyelonephritis however this possibility is not adequately assessed without contrast administration. 2. Markedly enlarged urinary bladder. Minimal haziness along the superior aspect of the bladder. Enlarged prostate gland may contribute to bladder outlet obstruction. 3. Prominent stool rectosigmoid region with mild circumferential thickening of the rectosigmoid wall. Minimal haziness of surrounding fat planes. Patient may be at risk for development of  stercoral colitis. 4. Small hiatal hernia. 5. Prominent size gallbladder without CT evidence of inflammation. 6. Aortic Atherosclerosis (ICD10-I70.0). Coronary artery calcification. 7. Degenerative changes lower thoracic and lumbar spine most notable on the right at the L3-4 level. Electronically Signed   By: Genia Del [name]   On: 03/03/2018 17:36   Dg Chest Port 1 View  Result Date: 03/03/2018 CLINICAL DATA:  Sepsis. EXAM: PORTABLE CHEST 1 VIEW COMPARISON:  Radiograph of August 24, 2013. FINDINGS: Stable cardiomegaly. Atherosclerosis of thoracic aorta is noted. No pneumothorax is noted. Mild bibasilar subsegmental atelectasis is noted. No significant pleural effusion is noted. Bony thorax is unremarkable. IMPRESSION: Mild bibasilar subsegmental  atelectasis. Aortic Atherosclerosis (ICD10-I70.0). Electronically Signed   By: Marijo Conception, [name]   On: 03/03/2018 14:57     Medications:   . sodium chloride 100 mL/hr at 03/05/18 0551  . sodium chloride Stopped (03/05/18 0235)  . ceFEPime (MAXIPIME) IV 1 g (03/05/18 0531)  . metronidazole 500 mg (03/05/18 9295)   . amLODipine  5 mg Oral Daily  . aspirin EC  81 mg Oral Daily  . gemfibrozil  600 mg Oral BID AC  . heparin  5,000 Units Subcutaneous Q8H  . insulin aspart  0-5 Units Subcutaneous QHS  . insulin aspart  0-9 Units Subcutaneous TID WC  . insulin aspart protamine- aspart  30 Units Subcutaneous BID WC  . latanoprost  1 drop Both Eyes QHS  . tamsulosin  0.4 mg Oral Daily   sodium chloride, acetaminophen **OR** acetaminophen, albuterol, bisacodyl, hydrALAZINE, HYDROcodone-acetaminophen, ondansetron **OR** ondansetron (ZOFRAN) IV, senna-docusate  Assessment/ Plan:  Mr. HEITH HAIGLER is a 85 y.o. white male with diabetes mellitus type II, diabetic retinopathy,diabetic peripheral neuropathy, history of diabetic foot ulcer, hypertension, hyperlipidemia, deafness, BPH  1. Acute renal failure with metabolic acidosis on chronic kidney disease stage III with microalbuminuria  Baseline creatinine of 1.58, GFR of 40 on 09/11/17 Chronic kidney disease secondary to diabetic nephropathy Acute renal failure secondary to ATN, prerenal azotemia and sepsis Nonoliguric urine outpatient. Unable to have indwelling catheter passed.  - NS at 111mL/hr  - Pending renal ultrasound.  - holding outpatient lisinopril.   2. Sepsis: source unknown. Cultures negative so far. However with urinary symptoms and new onset hematuria, suspect urinary source Empiric metronidazole and cefepime.   3. Hypertension: with history of difficult to control peripheral edema. Holding furosemide and lisinopril.  BP 142/86  4. Diabetes mellitus type II with chronic kidney disease: insulin dependent on metformin -  holding   LOS: 2 Satomi Buda 2/12/202012:41 PM

## 2018-03-05 NOTE — Evaluation (Signed)
Physical Therapy Evaluation Patient Details Name: Damon [name] MRN: [id number] DOB: 12-01-1933 Today's Date: 03/05/2018   History of Present Illness  Pt is an 85 y.o. male presenting to hospital 03/03/18 via direct admit d/t sepsis.  Pt noted with urine frequency and urine dripping past few days.  Pt admitted with sepsis, acute renal failure on CKD stage III, htn, and DM.  PMH includes bradycardia, CHF, CKD, htn, DM, HOH, and small hiatal hernia.  Clinical Impression  Prior to hospital admission, pt was requiring 1-2 assist to stand (although could stand on own with use of lift chair) and ambulatory short household distances with SW use and assist); of note pt had been independent with ambulation prior to fall 3 weeks ago and has had 24/7 assist since then d/t decline in function.  Family reporting pt with impaired balance since fall and has needed assist with pulling up pants when toileting.  Pt lives with his daughter in 1 level home with level entry.  Currently pt is mod to max assist semi-supine to sit; 2 assist to stand from elevated bed and from recliner using RW; and pt able to ambulate a few feet bed to/from recliner with RW use and 2 assist.  Pt voicing frustration with use of RW and prefers to use SW instead (pt's family reports plan to bring SW in d/t pt's being a little wider than typical and pt feeling more comfortable with personal walker).  Pt would benefit from skilled PT to address noted impairments and functional limitations (see below for any additional details).  Upon hospital discharge, recommend pt discharge to home with continued 24/7 assist and HHPT (if family reports that they can not safely provide required assist for required home functional mobility, recommend STR).    Follow Up Recommendations Home health PT;Supervision/Assistance - 24 hour    Equipment Recommendations  Standard walker;3in1 (PT);Wheelchair (measurements PT);Wheelchair cushion (measurements PT)     Recommendations for Other Services OT consult     Precautions / Restrictions Precautions Precautions: Fall Restrictions Weight Bearing Restrictions: No      Mobility  Bed Mobility Overal bed mobility: Needs Assistance Bed Mobility: Supine to Sit;Sit to Supine     Supine to sit: Mod assist;Max assist;HOB elevated  Sit to supine: 2 assist   General bed mobility comments: assist for trunk and LE's semi-supine to sit; 2 assist for safety sit to supine and to boost up in bed  Transfers Overall transfer level: Needs assistance Equipment used: Rolling walker (2 wheeled) Transfers: Sit to/from Stand Sit to Stand: Mod assist;+2 physical assistance;From elevated surface         General transfer comment: pt unable to stand from elevated bed with 1 assist (pt also noted to not be initiating to assist and just stating "I can't do it") but able to stand from elevated bed and then from recliner with 2 assist and RW use (physical therapy assistant assisted with all 2 assist mobility during session)  Ambulation/Gait Ambulation/Gait assistance: Min guard;Min assist Gait Distance (Feet): (3 feet x2 (bed to/from recliner)) Assistive device: Rolling walker (2 wheeled)   Gait velocity: decreased   General Gait Details: assist for walker use and directions for ambulation  Stairs            Wheelchair Mobility    Modified Rankin (Stroke Patients Only)       Balance Overall balance assessment: Needs assistance Sitting-balance support: No upper extremity supported;Feet supported Sitting balance-Leahy Scale: Good Sitting balance - Comments: steady sitting  reaching within BOS   Standing balance support: Single extremity supported Standing balance-Leahy Scale: Poor Standing balance comment: pt requiring at least single UE support for static standing balance                             Pertinent Vitals/Pain Pain Assessment: (pt reporting discomfort R hip/groin area  d/t brief irritation in sitting (resolved laying down)).  Vitals (HR and O2 on room air) stable and WFL throughout treatment session.    Home Living Family/patient expects to be discharged to:: Private residence Living Arrangements: Children(Pt's daughter) Available Help at Discharge: Family Type of Home: House Home Access: Level entry     Home Layout: One [place]: Walker - standard;Hospital bed;Other (comment);Cane - single point(Lift chair)      Prior Function Level of Independence: Needs assistance   Gait / Transfers Assistance Needed: Pt was independent with ambulation prior to fall about 3 weeks ago; since then has required 1-2 assist to stand (but able to stand from lift chair on own) and ambulating short household distances with SW/assist.  ADL's / Homemaking Assistance Needed: Takes sponge baths.  Comments: 2 falls since beginning of 2020 (1 from low blood sugar; 2nd unknown--found on floor)     Hand Dominance        Extremity/Trunk Assessment   Upper Extremity Assessment Upper Extremity Assessment: Generalized weakness    Lower Extremity Assessment Lower Extremity Assessment: Generalized weakness(able to perform B LE SLR)    Cervical / Trunk Assessment Cervical / Trunk Assessment: Normal  Communication   Communication: HOH  Cognition Arousal/Alertness: Awake/alert Behavior During Therapy: WFL for tasks assessed/performed Overall Cognitive Status: Within Functional Limits for tasks assessed                                        General Comments General comments (skin integrity, edema, etc.): Pt noted with incontinence of bowel and bladder:  2 NT's came to assist with cleaning pt up and changing brief in standing.  Nursing cleared pt for participation in physical therapy.  Pt agreeable to PT session.  Pt's 48 y.o. grandson present during session.    Exercises  Transfer training   Assessment/Plan    PT Assessment Patient needs  continued PT services  PT Problem List Decreased strength;Decreased activity tolerance;Decreased balance;Decreased mobility;Decreased knowledge of use of DME;Decreased knowledge of precautions       PT Treatment Interventions DME instruction;Gait training;Functional mobility training;Therapeutic activities;Therapeutic exercise;Balance training;Patient/family education    PT Goals (Current goals can be found in the Care Plan section)  Acute Rehab PT Goals Patient Stated Goal: to improve mobility PT Goal Formulation: With patient Time For Goal Achievement: 03/19/18 Potential to Achieve Goals: Fair    Frequency Min 2X/week   Barriers to discharge        Co-evaluation               AM-PAC PT "6 Clicks" Mobility  Outcome Measure Help needed turning from your back to your side while in a flat bed without using bedrails?: A Little Help needed moving from lying on your back to sitting on the side of a flat bed without using bedrails?: A Lot Help needed moving to and from a bed to a chair (including a wheelchair)?: Total Help needed standing up from a chair using your arms (e.g., wheelchair or  bedside chair)?: Total Help needed to walk in hospital room?: Total Help needed climbing 3-5 steps with a railing? : Total 6 Click Score: 9    End of Session Equipment Utilized During Treatment: Gait belt Activity Tolerance: Patient tolerated treatment well Patient left: in bed;with call bell/phone within reach;with bed alarm set;with family/visitor present Nurse Communication: Mobility status;Precautions PT Visit Diagnosis: Other abnormalities of gait and mobility (R26.89);Muscle weakness (generalized) (M62.81);History of falling (Z91.81);Difficulty in walking, not elsewhere classified (R26.2)    Time: 7919-9579 PT Time Calculation (min) (ACUTE ONLY): 38 min   Charges:   PT Evaluation $PT Eval Low Complexity: 1 Low PT Treatments $Therapeutic Activity: 8-22 mins      Leitha Bleak, PT 03/05/18, 5:04 PM [phone]

## 2018-03-06 ENCOUNTER — Inpatient Hospital Stay: Payer: PPO

## 2018-03-06 LAB — BASIC METABOLIC PANEL
Anion gap: 9 (ref 5–15)
BUN: 58 mg/dL — ABNORMAL HIGH (ref 8–23)
CO2: 19 mmol/L — ABNORMAL LOW (ref 22–32)
Calcium: 8 mg/dL — ABNORMAL LOW (ref 8.9–10.3)
Chloride: 115 mmol/L — ABNORMAL HIGH (ref 98–111)
Creatinine, Ser: 2.57 mg/dL — ABNORMAL HIGH (ref 0.61–1.24)
GFR calc Af Amer: 26 mL/min — ABNORMAL LOW (ref >60)
GFR calc non Af Amer: 22 mL/min — ABNORMAL LOW (ref >60)
Glucose, Bld: 130 mg/dL — ABNORMAL HIGH (ref 70–99)
Potassium: 3.8 mmol/L (ref 3.5–5.1)
Sodium: 143 mmol/L (ref 135–145)

## 2018-03-06 LAB — GLUCOSE, CAPILLARY
Glucose-Capillary: 132 mg/dL — ABNORMAL HIGH (ref 70–99)
Glucose-Capillary: 132 mg/dL — ABNORMAL HIGH (ref 70–99)
Glucose-Capillary: 201 mg/dL — ABNORMAL HIGH (ref 70–99)
Glucose-Capillary: 102 mg/dL — ABNORMAL HIGH (ref 70–99)

## 2018-03-06 LAB — CBC
HCT: 46.3 % (ref 39.0–52.0)
Hemoglobin: 15.7 g/dL (ref 13.0–17.0)
MCH: 29.6 pg (ref 26.0–34.0)
MCHC: 33.9 g/dL (ref 30.0–36.0)
MCV: 87.4 fL (ref 80.0–100.0)
Platelets: 172 10*3/uL (ref 150–400)
RBC: 5.3 MIL/uL (ref 4.22–5.81)
RDW: 14.4 % (ref 11.5–15.5)
WBC: 7.6 10*3/uL (ref 4.0–10.5)
nRBC: 0 % (ref 0.0–0.2)

## 2018-03-06 LAB — MAGNESIUM: Magnesium: 2.4 mg/dL (ref 1.7–2.4)

## 2018-03-06 LAB — PHOSPHORUS: Phosphorus: 4.3 mg/dL (ref 2.5–4.6)

## 2018-03-06 NOTE — Care Management Important Message (Signed)
Copy of signed Medicare IM left with patient and family in room.  

## 2018-03-06 NOTE — Progress Notes (Signed)
[facility]  ROUNDING NOTE   Subjective:   Daughter at bedside.   Right thigh pain.   NS at 146mL/hr  Objective:  Vital signs in last 24 hours:  Temp:  [97.5 F (36.4 C)-98.6 F (37 C)] 98.3 F (36.8 C) (02/13 0525) Pulse Rate:  [59-73] 73 (02/13 0525) Resp:  [20-22] 22 (02/13 0525) BP: (129-157)/(73-94) 146/76 (02/13 0525) SpO2:  [95 %-99 %] 99 % (02/13 0525)  Weight change:  Filed Weights   03/03/18 1700  Weight: 96.4 kg    Intake/Output: I/O last 3 completed shifts: In: 1663 [P.O.:480; I.V.:948.8; IV Piggyback:234.2] Out: -    Intake/Output this shift:  Total I/O In: 2935.6 [I.V.:2269.8; IV Piggyback:665.8] Out: -   Physical Exam: General: NAD,   Head: Normocephalic, atraumatic. Moist oral mucosal membranes  Eyes: Anicteric, PERRL  Neck: Supple, trachea midline  Lungs:  Clear to auscultation  Heart: Regular rate and rhythm  Abdomen:  Soft, nontender,   Extremities: no peripheral edema. No tenderness on palpation on groin bilateral  Neurologic: Alert to self and place  Skin: No lesions        Basic Metabolic Panel: Recent Labs  Lab 03/03/18 1434 03/04/18 0531 03/05/18 0404 03/06/18 0522  NA 142 134* 138 143  K 4.5 4.5 4.0 3.8  CL 106 103 107 115*  CO2 17* 18* 19* 19*  GLUCOSE 161* 372* 242* 130*  BUN 48* 54* 64* 58*  CREATININE 2.22* 2.28* 2.75* 2.57*  CALCIUM 9.1 7.8* 7.7* 8.0*  MG  --   --  2.4 2.4  PHOS  --   --  5.7* 4.3    Liver Function Tests: Recent Labs  Lab 03/03/18 1434  AST 20  ALT 20  ALKPHOS 83  BILITOT 1.4*  PROT 8.3*  ALBUMIN 4.2   No results for input(s): LIPASE, AMYLASE in the last 168 hours. No results for input(s): AMMONIA in the last 168 hours.  CBC: Recent Labs  Lab 03/03/18 1434 03/04/18 0531 03/05/18 0404 03/06/18 0522  WBC 13.7* 12.1* 9.8 7.6  NEUTROABS 12.0*  --   --   --   HGB 18.6* 17.0 16.1 15.7  HCT 57.9* 50.2 48.0 46.3  MCV 90.9 86.6 87.3 87.4  PLT 203 174 167 172     Cardiac Enzymes: No results for input(s): CKTOTAL, CKMB, CKMBINDEX, TROPONINI in the last 168 hours.  BNP: Invalid input(s): POCBNP  CBG: Recent Labs  Lab 03/05/18 1622 03/05/18 2103 03/05/18 2154 03/06/18 0731 03/06/18 1207  GLUCAP 155* 66* 92 132* 132*    Microbiology: Results for orders placed or performed during the hospital encounter of 03/03/18  Culture, blood (x 2)     Status: None (Preliminary result)   Collection Time: 03/03/18  2:34 PM  Result Value Ref Range Status   Specimen Description BLOOD BLOOD LEFT HAND  Final   Special Requests   Final    BOTTLES DRAWN AEROBIC AND ANAEROBIC Blood Culture results may not be optimal due to an inadequate volume of blood received in culture bottles   Culture   Final    NO GROWTH 3 DAYS Performed at [facility], [address] [place], [place] [address]    Report Status PENDING  Incomplete  Culture, blood (x 2)     Status: None (Preliminary result)   Collection Time: 03/03/18  4:27 PM  Result Value Ref Range Status   Specimen Description BLOOD RIGHT [facility]  Final   Special Requests   Final    BOTTLES  DRAWN AEROBIC AND ANAEROBIC Blood Culture results may not be optimal due to an inadequate volume of blood received in culture bottles   Culture   Final    NO GROWTH 3 DAYS Performed at [facility], [address] [place], [place] [address]    Report Status PENDING  Incomplete  Urine Culture     Status: None   Collection Time: 03/04/18 11:08 AM  Result Value Ref Range Status   Specimen Description   Final    URINE, RANDOM Performed at [facility], [address] [place], [place] [address]    Special Requests   Final    NONE Performed at [facility], [address] [place], [place] [address]    Culture   Final    NO GROWTH Performed at [facility], [place] [address] [place], [place] [address]    Report Status 03/05/2018 FINAL  Final    Coagulation Studies: Recent  Labs    03/03/18 1434  LABPROT 13.3  INR 1.02    Urinalysis: Recent Labs    03/04/18 1108  COLORURINE YELLOW*  LABSPEC 1.009  PHURINE 5.0  GLUCOSEU >=500*  HGBUR LARGE*  BILIRUBINUR NEGATIVE  KETONESUR NEGATIVE  PROTEINUR NEGATIVE  NITRITE NEGATIVE  LEUKOCYTESUR NEGATIVE      Imaging: US Renal  Result Date: 03/05/2018 CLINICAL DATA:  Acute renal failure. EXAM: RENAL / URINARY TRACT ULTRASOUND COMPLETE COMPARISON:  None. FINDINGS: Right Kidney: Renal measurements: 11.4 x 6.4 x 4.9 cm = volume: 186.3 mL . Echogenicity within normal limits. No mass or hydronephrosis visualized. Left Kidney: Renal measurements: 11.5 x 5.7 x 4.3 cm = volume: 149.3 mL. Echogenicity within normal limits. No mass or hydronephrosis visualized. Bladder: No focal lesion. The bladder is distended. Prostatomegaly seen on CT scan is difficult to visualize on this exam. IMPRESSION: Negative for hydronephrosis.  No acute abnormality. Distended urinary bladder is likely due to prostatomegaly as seen on CT scan 2 days ago. Electronically Signed   By: Inge Rise [name]   On: 03/05/2018 14:45   Dg Hip Unilat With Pelvis 2-3 Views Left  Result Date: 03/06/2018 CLINICAL DATA:  Bilateral hip pain after fall 2 months ago. EXAM: DG HIP (WITH OR WITHOUT PELVIS) 2-3V LEFT COMPARISON:  None. FINDINGS: There is no evidence of hip fracture or dislocation. There is no evidence of arthropathy or other focal bone abnormality. IMPRESSION: Negative. Electronically Signed   By: Marijo Conception, [name]   On: 03/06/2018 13:38   Dg Hip Unilat With Pelvis 2-3 Views Right  Result Date: 03/06/2018 CLINICAL DATA:  Bilateral hip pain after fall 2 months ago. EXAM: DG HIP (WITH OR WITHOUT PELVIS) 2-3V RIGHT COMPARISON:  None. FINDINGS: There is no evidence of hip fracture or dislocation. There is no evidence of arthropathy or other focal bone abnormality. IMPRESSION: Negative. Electronically Signed   By: Marijo Conception, [name]   On:  03/06/2018 13:37     Medications:   . sodium chloride 100 mL/hr at 03/06/18 0740  . sodium chloride Stopped (03/05/18 0235)  . ceFEPime (MAXIPIME) IV Stopped (03/06/18 0617)  . metronidazole 500 mg (03/06/18 1210)   . amLODipine  5 mg Oral Daily  . aspirin EC  81 mg Oral Daily  . gemfibrozil  600 mg Oral BID AC  . heparin  5,000 Units Subcutaneous Q8H  . insulin aspart  0-5 Units Subcutaneous QHS  . insulin aspart  0-9 Units Subcutaneous TID WC  . insulin aspart protamine-  aspart  30 Units Subcutaneous BID WC  . latanoprost  1 drop Both Eyes QHS  . tamsulosin  0.4 mg Oral Daily   sodium chloride, acetaminophen **OR** acetaminophen, albuterol, bisacodyl, hydrALAZINE, HYDROcodone-acetaminophen, ondansetron **OR** ondansetron (ZOFRAN) IV, senna-docusate  Assessment/ Plan:  Mr. Damon [name] is a 85 y.o. white male with diabetes mellitus type II, diabetic retinopathy,diabetic peripheral neuropathy, history of diabetic foot ulcer, hypertension, hyperlipidemia, deafness, BPH  1. Acute renal failure with metabolic acidosis on chronic kidney disease stage III with microalbuminuria  Baseline creatinine of 1.58, GFR of 40 on 09/11/17 Chronic kidney disease secondary to diabetic nephropathy Acute renal failure secondary to ATN, prerenal azotemia and sepsis Nonoliguric urine outpatient. Unable to have indwelling catheter passed.  - NS at 190mL/hr  - holding outpatient lisinopril.   2. Sepsis: source unknown. Cultures negative so far. However with urinary symptoms and new onset hematuria, suspect urinary source Empiric metronidazole and cefepime.   3. Hypertension: with history of difficult to control peripheral edema. Holding furosemide and lisinopril.   4. Diabetes mellitus type II with chronic kidney disease: insulin dependent holding metformin.    LOS: 3 Keithen Capo 2/13/20202:08 PM

## 2018-03-06 NOTE — Evaluation (Signed)
Occupational Therapy Evaluation Patient Details Name: Damon [name] MRN: [id number] DOB: 25-Aug-1933 Today's Date: 03/06/2018    History of Present Illness Pt is an 85 y.o. male presenting to hospital 03/03/18 via direct admit d/t sepsis.  Pt noted with urine frequency and urine dripping past few days.  Pt admitted with sepsis, acute renal failure on CKD stage III, htn, and DM.  PMH includes bradycardia, CHF, CKD, htn, DM, HOH, and small hiatal hernia.   Clinical Impression   Pt seen for OT evaluation this date. Prior to hospital admission and recent fall 3 weeks ago, pt was independent. After the fall, pt has been living with his daughter and requiring significant assist for all aspects of LB bathing/dressing, toileting, and functional mobility. Pt agreeable to attempted bed mobility with encouragement, requiring MOD-MAX assist for sup>sit EOB and MOD assist to scoot, with instruction for shifting weight laterally to bring hips forward one at a time. Pt reporting significant R groin pain during mobility. OT assessed skin integrity, groin noted to be slightly pink/red, not painful to touch, with pt reporting the pain "is deeper." Pt became increasingly agitated at encouragement to attempt standing despite emotional support and gently encouragement from therapist 2/2 not wanting to cause more pain or to "get hurt anymore." Pt assisted back to bed with MAX A x2. Pt noted to be incontinent with condom catheter having been displaced. NT notified. RN notified of groin pain. Currently pt demonstrates impairments in R groin pain, strength, and balance requiring MAX assist for LB ADL.  Pt would benefit from skilled OT to address noted impairments and functional limitations (see below for any additional details) in order to maximize safety and independence while minimizing falls risk and caregiver burden.  Upon hospital discharge, recommend pt discharge to STR, pending progress with improved pain mgt and functional  status.     Follow Up Recommendations  SNF    Equipment Recommendations  Other (comment)(TBD, likely 3:1)    Recommendations for Other Services       Precautions / Restrictions Precautions Precautions: Fall Restrictions Weight Bearing Restrictions: No      Mobility Bed Mobility Overal bed mobility: Needs Assistance Bed Mobility: Supine to Sit;Sit to Supine     Supine to sit: Mod assist;Max assist;HOB elevated Sit to supine: +2 for physical assistance;Max assist      Transfers                 General transfer comment: unable to attempt 2/2 R groin pain    Balance Overall balance assessment: Needs assistance Sitting-balance support: No upper extremity supported;Feet supported Sitting balance-Leahy Scale: Good                                     ADL either performed or assessed with clinical judgement   ADL Overall ADL's : Needs assistance/impaired Eating/Feeding: Sitting;Independent   Grooming: Sitting;Supervision/safety   Upper Body Bathing: Bed level;Moderate assistance;Minimal assistance   Lower Body Bathing: Bed level;Maximal assistance   Upper Body Dressing : Sitting;Minimal assistance   Lower Body Dressing: Bed level;Maximal assistance     Toilet Transfer Details (indicate cue type and reason): unable to attempt 2/2 R groin pain                 Vision Patient Visual Report: No change from baseline       Perception     Praxis  Pertinent Vitals/Pain Pain Assessment: Faces Faces Pain Scale: Hurts whole lot Pain Location: R groin "deep"  Pain Descriptors / Indicators: Aching;Grimacing;Guarding;Sharp Pain Intervention(s): Limited activity within patient's tolerance;Monitored during session;Repositioned;Patient requesting pain meds-RN notified     Hand Dominance Right   Extremity/Trunk Assessment Upper Extremity Assessment Upper Extremity Assessment: Generalized weakness   Lower Extremity Assessment Lower  Extremity Assessment: Generalized weakness;RLE deficits/detail RLE Deficits / Details: limited hip assessment 2/2 R groin pain   Cervical / Trunk Assessment Cervical / Trunk Assessment: Normal   Communication Communication Communication: HOH   Cognition Arousal/Alertness: Awake/alert Behavior During Therapy: WFL for tasks assessed/performed Overall Cognitive Status: Within Functional Limits for tasks assessed                                     General Comments  pt incontinent of bladder, condom catheter noted to be displaced. NT notified    Exercises Other Exercises Other Exercises: pt instructed in weight shifting while seated EOB to help improve pt's ability to scoot closer to the edge in preparation for functional transfers for ADL, pt required min-mod assist ultimately and limited by R groin pain   Shoulder Instructions      Home Living Family/patient expects to be discharged to:: Private residence Living Arrangements: Children(Pt's daughter) Available Help at Discharge: Family Type of Home: House Home Access: Level entry     Home Layout: One level     Bathroom Shower/Tub: (takes sponge baths)   Bathroom Toilet: Standard(with toilet riser)     Home Equipment: Walker - standard;Hospital bed;Other (comment);Cane - single point(Lift chair)          Prior Functioning/Environment Level of Independence: Needs assistance  Gait / Transfers Assistance Needed: Pt was independent with ambulation prior to fall about 3 weeks ago; since then has required 1-2 assist to stand (but able to stand from lift chair on own) and ambulating short household distances with SW/assist. ADL's / Homemaking Assistance Needed: Indep until 3 wks ago when he fell. Since has required extensive assist from family for all aspects of ADL including sponge baths. Daughter has been managing medications since before recent fall   Comments: 2 falls since beginning of 2020 (1 from low blood  sugar; 2nd unknown--found on floor)        OT Problem List: Decreased strength;Decreased range of motion;Obesity;Decreased knowledge of use of DME or AE;Pain;Impaired balance (sitting and/or standing)      OT Treatment/Interventions: Self-care/ADL training;Balance training;Therapeutic exercise;Therapeutic activities;DME and/or AE instruction;Patient/family education    OT Goals(Current goals can be found in the care plan section) Acute Rehab OT Goals Patient Stated Goal: have less groin pain OT Goal Formulation: With patient/family Time For Goal Achievement: 03/20/18 Potential to Achieve Goals: Good ADL Goals Pt Will Perform Lower Body Dressing: with min assist;sit to/from stand(AE as needed, LRAD for amb) Pt Will Transfer to Toilet: stand pivot transfer;bedside commode;with min assist;with +2 assist Pt Will Perform Toileting - Clothing Manipulation and hygiene: with mod assist;sit to/from stand Additional ADL Goal #1: Pt will perform bed mobility as part of bed level ADL tasks with MIN A and pain <4/10 during mobility.  OT Frequency: Min 2X/week   Barriers to D/C:            Co-evaluation              AM-PAC OT "6 Clicks" Daily Activity     Outcome Measure Help from  another person eating meals?: None Help from another person taking care of personal grooming?: A Little Help from another person toileting, which includes using toliet, bedpan, or urinal?: A Lot Help from another person bathing (including washing, rinsing, drying)?: A Lot Help from another person to put on and taking off regular upper body clothing?: A Lot Help from another person to put on and taking off regular lower body clothing?: A Lot 6 Click Score: 15   End of Session Equipment Utilized During Treatment: Gait belt Nurse Communication: Other (comment)(R groin pain, incontinence)  Activity Tolerance: Patient limited by pain Patient left: in bed;with call bell/phone within reach;with bed alarm set;with  family/visitor present  OT Visit Diagnosis: Other abnormalities of gait and mobility (R26.89);Muscle weakness (generalized) (M62.81);History of falling (Z91.81);Pain Pain - Right/Left: Right Pain - part of body: Hip                Time: 1104-1130 OT Time Calculation (min): 26 min Charges:  OT General Charges $OT Visit: 1 Visit OT Evaluation $OT Eval Low Complexity: 1 Low OT Treatments $Therapeutic Activity: 8-22 mins  Jeni Salles, MPH, MS, OTR/L ascom [phone] 03/06/18, 1:49 PM

## 2018-03-06 NOTE — Progress Notes (Signed)
Inpatient Diabetes Program Recommendations  AACE/ADA: New Consensus Statement on Inpatient Glycemic Control   Target Ranges:  Prepandial:   less than 140 mg/dL      Peak postprandial:   less than 180 mg/dL (1-2 hours)      Critically ill patients:  140 - 180 mg/dL  Results for DUFF, POZZI (MRN [id number]) as of 03/06/2018 11:11  Ref. Range 03/05/2018 07:39 03/05/2018 11:38 03/05/2018 16:22 03/05/2018 21:03 03/05/2018 21:54 03/06/2018 07:31  Glucose-Capillary Latest Ref Range: 70 - 99 mg/dL 236 (H) 216 (H) 155 (H) 66 (L) 92 132 (H)    Review of Glycemic Control  Current orders for Inpatient glycemic control: 70/30 30 units BID, Novolog 0-9 units TID with meals, Novolog 0-5 units QHS  Inpatient Diabetes Program Recommendations:  Insulin - Basal: Please consider decreasing evening dose of 70/30 to 28 units QPM with supper (continue 70/30 30 units QAM with breakfast).  Thanks, Barnie Alderman, RN, MSN, CDE Diabetes Coordinator Inpatient Diabetes Program [phone] (Team Pager from 8am to 5pm)

## 2018-03-06 NOTE — Progress Notes (Signed)
[place] [facility] [place] NAME: Damon [name]    MR#:  [id number]  DATE OF BIRTH:  [birth year]  SUBJECTIVE:  CHIEF COMPLAINT: Patient sent as a direct admission from primary care physician's office for evaluation for possible sepsis.  Patient was doing well this morning.  Later notified after physical therapy started working with patient that patient was complained of right groin pain.  No swelling.  X-rays of the hip requested.  REVIEW OF SYSTEMS:  ROS Unobtainable at this time due to medical condition.  Patient currently resting comfortably.  DRUG ALLERGIES:   Allergies  Allergen Reactions  . Penicillins Other (See Comments)    Reaction as a child   VITALS:  Blood pressure (!) 152/80, pulse 83, temperature 97.8 F (36.6 C), temperature source Oral, resp. rate 20, height 5\' 2"  (1.575 m), weight 96.4 kg, SpO2 97 %. PHYSICAL EXAMINATION:   Physical Exam  Constitutional: He appears well-developed and well-nourished.  HENT:  Right Ear: External ear normal.  Left Ear: External ear normal.  Eyes: Pupils are equal, round, and reactive to light. Conjunctivae are normal. Right eye exhibits no discharge.  Neck: Neck supple. No tracheal deviation present.  Cardiovascular: Normal rate, regular rhythm and normal heart sounds.  Respiratory: Effort normal. No stridor. He has no wheezes.  GI: Soft. Bowel sounds are normal. He exhibits no distension.  Genitourinary:    Genitourinary Comments: No evidence of any swelling on exam to suggest hernia.   Musculoskeletal:        General: No deformity or edema.  Neurological:  Resting comfortably at this time.  Full neuro exam not done  Skin: Skin is warm. He is not diaphoretic. No erythema.   LABORATORY PANEL:  Male CBC Recent Labs  Lab 03/06/18 0522  WBC 7.6  HGB 15.7  HCT 46.3  PLT 172    ------------------------------------------------------------------------------------------------------------------ Chemistries  Recent Labs  Lab 03/03/18 1434  03/06/18 0522  NA 142   < > 143  K 4.5   < > 3.8  CL 106   < > 115*  CO2 17*   < > 19*  GLUCOSE 161*   < > 130*  BUN 48*   < > 58*  CREATININE 2.22*   < > 2.57*  CALCIUM 9.1   < > 8.0*  MG  --    < > 2.4  AST 20  --   --   ALT 20  --   --   ALKPHOS 83  --   --   BILITOT 1.4*  --   --    < > = values in this interval not displayed.   RADIOLOGY:  Dg Hip Unilat With Pelvis 2-3 Views Left  Result Date: 03/06/2018 CLINICAL DATA:  Bilateral hip pain after fall 2 months ago. EXAM: DG HIP (WITH OR WITHOUT PELVIS) 2-3V LEFT COMPARISON:  None. FINDINGS: There is no evidence of hip fracture or dislocation. There is no evidence of arthropathy or other focal bone abnormality. IMPRESSION: Negative. Electronically Signed   By: Marijo Conception, [name]   On: 03/06/2018 13:38   Dg Hip Unilat With Pelvis 2-3 Views Right  Result Date: 03/06/2018 CLINICAL DATA:  Bilateral hip pain after fall 2 months ago. EXAM: DG HIP (WITH OR WITHOUT PELVIS) 2-3V RIGHT COMPARISON:  None. FINDINGS: There is no evidence of hip fracture or dislocation. There is no evidence of arthropathy or other focal bone abnormality. IMPRESSION: Negative. Electronically Signed   By: Jeneen Rinks  Murlean Caller, [name]   On: 03/06/2018 13:37   ASSESSMENT AND PLAN:    1. Sepsis; No source found yet. Urinalysis unremarkable.  Chest x-ray unremarkable apart from evidence of atelectasis.  Blood cultures so far with no growth.  Leukocytosis resolved.  CT abdomen and pelvis without contrast with no definite findings of pyelonephritis.  No obstructing stone or hydronephrosis.  Prominent stool in the rectosigmoid region with mild circumferential thickening.  Patient may be at risk of development of stercoral colitis.  Daughter at bedside however denies patient complaining of any abdominal pains.   IV vancomycin previously discontinued. Will de-escalate antibiotics further by discontinuing IV cefepime since no evidence of infectious process..  Continue Flagyl for now.   2. Acute renal failure on CKD stage III.  IV fluid support and follow-up BMP. Hold lisinopril and Lasix.  Patient seen by nephrologist.  Appreciate input.  Renal function continuing to improve.  Renal ultrasound done with no evidence of any hydronephrosis.  Patient has a condom catheter and voiding well.   3.Hypertension.  Blood pressure trending up. Resumed Norvasc.  Placed on PRN IV hydralazine with parameters  4.  Diabetes mellitus type 2 Blood sugars trending up.  Metformin currently on hold. Resumed insulin Novolin 70/30 at 30 units subcu twice daily.  Monitor and adjust regimen as needed.  Glycosylated hemoglobin level of 6.7 in a.m.  5.  Morbid obesity.  BMI of 38.87. Encourage lifestyle modifications including weight loss  6.  Right groin pain No evidence of findings to suggest hernia on exam. X-rays of both hips negative. Physical therapy to continue working with patient.  Will need home health with physical therapy on discharge.  DVT prophylaxis; heparin   All the records are reviewed and case discussed with Care Management/Social Worker. Management plans discussed with the patient, family and they are in agreement.  CODE STATUS: Full Code  TOTAL TIME TAKING CARE OF THIS PATIENT: 26 minutes.   More than 50% of the time was spent in counseling/coordination of care: YES  POSSIBLE D/C IN 1 DAYS, DEPENDING ON CLINICAL CONDITION. Physical therapist to evaluate and treat   Virat Prather M.D on 03/06/2018 at 2:19 PM  Between 7am to 6pm - Pager - [phone]  After 6pm go to [email or web address] - Proofreader  [facility]  Office  [phone]  CC: Primary care physician; Baxter Hire, MD  Note: This dictation was prepared with Dragon dictation along with smaller  phrase technology. Any transcriptional errors that result from this process are unintentional.

## 2018-03-06 NOTE — Consult Note (Signed)
Pharmacy Antibiotic Note  Damon [name] is a 85 y.o. male admitted on 03/03/2018 with infection of unknown source.  Pharmacy was consulted for cefepime and vancomycin dosing. Vancomycin was discontinued on 2/12. Leukocytosis has resolved and he has remained afebrile since admission. His SCr remains elevated above his baseline level of approximately 1.7 mg/dL. This is day number 4 of IV antibiotics.  Plan: Continue cefepime at the dose of 1 gram IV every 12 hours    Height: 5\' 2"  (157.5 cm) Weight: 212 lb 8.4 oz (96.4 kg) IBW/kg (Calculated) : 54.6  Temp (24hrs), Avg:98.2 F (36.8 C), Min:97.5 F (36.4 C), Max:98.6 F (37 C)  Recent Labs  Lab 03/03/18 1434 03/03/18 1627 03/03/18 2014 03/04/18 0531 03/05/18 0404 03/06/18 0522  WBC 13.7*  --   --  12.1* 9.8 7.6  CREATININE 2.22*  --   --  2.28* 2.75* 2.57*  LATICACIDVEN 2.4* 2.4* 1.8  --   --   --     Estimated Creatinine Clearance: 22.3 mL/min (A) (by C-G formula based on SCr of 2.57 mg/dL (H)).    Antimicrobials this admission: Cefepime 2/10 >>  Vanco 2/10 >> 2/12 Metronidazole 2/10 >>  Dose adjustments this admission:   Microbiology results: 2/10 Ucx NGF 2/10 Bcx NGTD  Thank you for allowing pharmacy to be a part of this patient's care.  Dallie Piles, PharmD Clinical Pharmacist 03/06/2018 12:12 PM

## 2018-03-07 LAB — BASIC METABOLIC PANEL
Anion gap: 8 (ref 5–15)
BUN: 49 mg/dL — ABNORMAL HIGH (ref 8–23)
CO2: 19 mmol/L — ABNORMAL LOW (ref 22–32)
Calcium: 8.2 mg/dL — ABNORMAL LOW (ref 8.9–10.3)
Chloride: 119 mmol/L — ABNORMAL HIGH (ref 98–111)
Creatinine, Ser: 2.27 mg/dL — ABNORMAL HIGH (ref 0.61–1.24)
GFR calc Af Amer: 30 mL/min — ABNORMAL LOW (ref >60)
GFR calc non Af Amer: 26 mL/min — ABNORMAL LOW (ref >60)
Glucose, Bld: 69 mg/dL — ABNORMAL LOW (ref 70–99)
Potassium: 3.7 mmol/L (ref 3.5–5.1)
Sodium: 146 mmol/L — ABNORMAL HIGH (ref 135–145)

## 2018-03-07 LAB — MAGNESIUM: Magnesium: 2.2 mg/dL (ref 1.7–2.4)

## 2018-03-07 LAB — GLUCOSE, CAPILLARY
Glucose-Capillary: 85 mg/dL (ref 70–99)
Glucose-Capillary: 80 mg/dL (ref 70–99)

## 2018-03-07 MED ORDER — METRONIDAZOLE 500 MG PO TABS: 500.0000 mg | ORAL_TABLET | Freq: Three times a day (TID) | ORAL | 0 refills | Status: AC

## 2018-03-07 MED ORDER — INSULIN NPH ISOPHANE & REGULAR (70-30) 100 UNIT/ML ~~LOC~~ SUSP: 25.0000 [IU] | Freq: Two times a day (BID) | SUBCUTANEOUS | 0 refills | Status: DC

## 2018-03-07 NOTE — Care Management Note (Signed)
Case Management Note  Patient Details  Name: Damon [name] MRN: [id number] Date of Birth: 12-02-1933   Patient admitted from home with colitis.  Patient lives at home with daughter. Daughter provides transportation to appointments. PCP Edwina Barth.  Pharmacy [facility].  Denies issues obtaining medications.  Patient has Rw, BSC, Hospital bed, elevated toilet seat, lift chair.  Per daughter Damon [name] already has a referral, they were unable to open due to admission. RNCM confirmed that they would like to continue with [facility].  Damon [name] with [facility] notified of discharge.   Subjective/Objective:                    Action/Plan:   Expected Discharge Date:  03/07/18               Expected Discharge Plan:  [place]  In-House Referral:     Discharge planning Services  CM Consult  Post Acute Care Choice:  Resumption of Svcs/PTA Provider, Home Health Choice offered to:  Adult Children  DME Arranged:    DME Agency:     HH Arranged:  PT HH Agency:  [place]  Status of Service:  Completed, signed off  If discussed at Long Length of Stay Meetings, dates discussed:    Additional Comments:  Damon Sessions, RN 03/07/2018, 3:49 PM

## 2018-03-07 NOTE — Progress Notes (Signed)
Discharge order received. Patient is alert and oriented. Vital signs stable . No signs of acute distress. Discharge instructions given to daughter.Patient verbalized understanding. No other issues noted at this time.

## 2018-03-07 NOTE — Consult Note (Signed)
Subjective:   CC: Right groin pain  HPI:  Damon [name] is a 85 y.o. male who was referred by [facility] for evaluation of above cc.   Symptoms were first noted a few days ago. Pain is located in right groin area, started shortly after urinary catheterization while admitted.    Associated with nothing specific, exacerbated by standing or moving legs into different positions.  No obvious bulge.     Past Medical History:  has a past medical history of Arrhythmia, Bradycardia, CHF (congestive heart failure) ([place]), Chronic kidney disease, Diabetes mellitus, Hard of hearing, and Hypertension.  Past Surgical History:  Past Surgical History:  Procedure Laterality Date  . COLONOSCOPY  01/01/2012   Procedure: COLONOSCOPY;  Surgeon: [name], MD;  Location: [facility];  Service: Endoscopy;  Laterality: N/A;  . PARATHYROIDECTOMY    . TONSILLECTOMY      Family History: family history includes Diabetes type II in his father and mother; Hypertension in his father and mother.  Social History:  reports that he has never smoked. He has quit using smokeless tobacco.  His smokeless tobacco use included chew. He reports that he does not drink alcohol or use drugs.  Current Medications:  Medications Prior to Admission  Medication Sig Dispense Refill  . amLODipine (NORVASC) 5 MG tablet Take 1 tablet by mouth 2 (two) times daily.    Marland Kitchen aspirin 81 MG tablet Take 81 mg by mouth daily.      . furosemide (LASIX) 40 MG tablet TAKE 1 TABLET BY MOUTH EVERY OTHER DAY (Patient taking differently: 40 mg daily. ) 45 tablet 2  . gemfibrozil (LOPID) 600 MG tablet TAKE 1 TABLET BY MOUTH TWICE DAILY BEFORE  MEALS 180 tablet 0  . latanoprost (XALATAN) 0.005 % ophthalmic solution Place 1 drop into both eyes at bedtime.    Marland Kitchen lisinopril (PRINIVIL,ZESTRIL) 2.5 MG tablet Take 1 tablet (2.5 mg total) by mouth daily. 90 tablet 3  . metFORMIN (GLUCOPHAGE-XR) 500 MG 24 hr tablet Take 500 mg by mouth daily.    . Omega-3 Fatty  Acids (FISH OIL PO) Take 1 capsule by mouth daily.    . tamsulosin (FLOMAX) 0.4 MG CAPS capsule Take 0.4 mg by mouth daily.    . Vitamin D, Ergocalciferol, (DRISDOL) 50000 units CAPS capsule TAKE 1 CAPSULE BY MOUTH ONCE A WEEK 12 capsule 3  . [DISCONTINUED] insulin NPH-regular Human (NOVOLIN 70/30) (70-30) 100 UNIT/ML injection 38 Units 2 (two) times daily with a meal. Inject 28 units before breakfast and 38 units before supper    . glucose blood test strip Use as instructed 100 each 12  . Insulin Syringe-Needle U-100 (B-D INS SYRINGE 0.5CC/30GX1/2") 30G X 1/2" 0.5 ML MISC Use to inject insulin twice a day. 100 each 1    Allergies:  Allergies as of 03/03/2018 - Review Complete 03/03/2018  Allergen Reaction Noted  . Penicillins Other (See Comments)     ROS:  Unable to obtain full ROS secondary to patient status   Objective:     BP (!) 148/78 (BP Location: Left Arm)   Pulse 63   Temp 99.2 F (37.3 C) (Oral)   Resp 20   Ht 5\' 2"  (1.575 m)   Wt 96.4 kg   SpO2 97%   BMI 38.87 kg/m   Constitutional :  alert, cooperative, appears stated age and no distress  Lymphatics/Throat:  no asymmetry, masses, or scars  Respiratory:  clear to auscultation bilaterally  Cardiovascular:  regular rate and  rhythm  Gastrointestinal: soft, non-tender; bowel sounds normal; no masses,  no organomegaly. Extensive exam while patient laying supine did not reveal any focal tenderness or obvious bulge/defect.  Palpation of right testicle did not elicit any tenderness, and no mass noted.  Minor yeast infection noted in groin with chronic discoloration.  Left groin region also negative for any abnormalities  Musculoskeletal: lying in bed, no obvious difficulty moving upper extremities  Skin: Cool and moist  Psychiatric: Normal affect, non-agitated, not confused       LABS:  CMP Latest Ref Rng & Units 03/07/2018 03/06/2018 03/05/2018  Glucose 70 - 99 mg/dL 69(L) 130(H) 242(H)  BUN 8 - 23 mg/dL 49(H) 58(H) 64(H)   Creatinine 0.61 - 1.24 mg/dL 2.27(H) 2.57(H) 2.75(H)  Sodium 135 - 145 mmol/L 146(H) 143 138  Potassium 3.5 - 5.1 mmol/L 3.7 3.8 4.0  Chloride 98 - 111 mmol/L 119(H) 115(H) 107  CO2 22 - 32 mmol/L 19(L) 19(L) 19(L)  Calcium 8.9 - 10.3 mg/dL 8.2(L) 8.0(L) 7.7(L)  Total Protein 6.5 - 8.1 g/dL - - -  Total Bilirubin 0.3 - 1.2 mg/dL - - -  Alkaline Phos 38 - 126 U/L - - -  AST 15 - 41 U/L - - -  ALT 0 - 44 U/L - - -   CBC Latest Ref Rng & Units 03/06/2018 03/05/2018 03/04/2018  WBC 4.0 - 10.5 K/uL 7.6 9.8 12.1(H)  Hemoglobin 13.0 - 17.0 g/dL 15.7 16.1 17.0  Hematocrit 39.0 - 52.0 % 46.3 48.0 50.2  Platelets 150 - 400 K/uL 172 167 174    RADS: Plain pelvis films and CT scan personally reviewed by myself and noted possible joint space narrowing in right hip area? And possible small inguinal hernia containing fat? Assessment:   Right inguinal pain Plan:    Physical exam not able to appreciate obvious right inguinal hernia and imaging inconclusive as well.  Even if hernia is present, the degree of symptoms does not outweigh his high perioperative risks of undergoing surgical repair.  Recommend symptomatic control as needed and medical optimization per medical team recs.  I left daughter with our office contact info in case she will like to reconsider surgical repair.  She and patient verbalized understanding

## 2018-03-07 NOTE — Discharge Summary (Signed)
[place] [facility] [place] NAME: Damon [name]    MR#:  [id number]  DATE OF BIRTH:  Feb 13, 1933  DATE OF ADMISSION:  03/03/2018   ADMITTING PHYSICIAN: Demetrios Loll, MD  DATE OF DISCHARGE: 03/07/2018  PRIMARY CARE PHYSICIAN: Baxter Hire, MD   ADMISSION DIAGNOSIS:  Sepsis DISCHARGE DIAGNOSIS:  Active Problems:   Sepsis ([place])  SECONDARY DIAGNOSIS:   Past Medical History:  Diagnosis Date  . Arrhythmia    Baseline bradycardia  . Bradycardia   . CHF (congestive heart failure) ([place])   . Chronic kidney disease    B[place]eline creatinine 1.4 to 1.5  . Diabetes mellitus   . Hard of hearing   . Hypertension    HOSPITAL COURSE:  Chief complaint; patient sent for evaluation of sepsis  History of presenting complaint; Damon [name]  is a 85 y.o. male with a known history of arrhythmia, bradycardia, CHF, CKD, hypertension, diabetes, hard hearing.  The patient is sent from his PCPs office for direct admission due to sepsis.  The patient has no complaints.  He looks like not a good historian.  According to his daughter, the patient has had urine frequency and urine dripping for the past few days.  In addition, the patient has poor oral intake. The patient went to PCPs office and found leukocytosis at 16,000, worsening renal function and and low systolic blood pressure, suspected sepsis.  Dr. Edwina Barth requested direct admission.  Please refer to the H&P dictated for further details   Hospital course; 1.  Probable sepsis; Likely due to mild colitis.  Patient was initially placed on broad-spectrum IV antibiotics with vancomycin and cefepime as well as Flagyl..  Chest x-ray unremarkable apart from evidence of atelectasis.  Blood cultures so far with no growth.  Leukocytosis resolved.  CT abdomen and pelvis without contrast with no definite findings of pyelonephritis.  No obstructing stone or hydronephrosis.  Prominent stool in the rectosigmoid region with mild  circumferential thickening.  Patient may be at risk of development of stercoral colitis.  Significantly improved clinically.  Being discharged on p.o. Flagyl for 5 more days to complete total of 10-day treatment duration.  2. Acute renal failure on CKD stage III.  Renal function improved with IV fluid hydration.  Lasix and lisinopril held.  Seen by nephrologist.  Appreciate input.  Renal function continues to improve.  Being discharged to follow-up with nephrologist as outpatient.    3.Hypertension.  Blood pressure fairly controlled.  Continue Norvasc outpatient follow-up with primary care physician and monitoring  4.  Diabetes mellitus type 2 Metformin discontinued due to recent acute kidney injury.Resumed insulin Novolin 70/30 at 30 units subcu twice daily.  Glycosylated hemoglobin level of 6.7. Outpatient monitoring by primary care physician.  5.  Morbid obesity.  BMI of 38.87. Encourage lifestyle modifications including weight loss  6.  Right groin pain No evidence of findings to suggest hernia on exam. X-rays of both hips negative.  Requested for surgery input today.  Patient seen by surgeon and no evidence of any obvious right inguinal hernia.  Recommended symptomatic control as needed.  May be related to mild colitis on CT scan.  Already placed on Flagyl.  Patient and daughter wishes to be discharged as planned.  I went back to reevaluate patient prior to discharge and patient currently denies any symptoms.  Physical therapy to continue working with patient.  Recommendation is for home health with physical therapy on discharge.   DISCHARGE CONDITIONS:  Stable CONSULTS OBTAINED:  Treatment Team:  Lavonia Dana, MD DRUG ALLERGIES:   Allergies  Allergen Reactions  . Penicillins Other (See Comments)    Reaction as a child   DISCHARGE MEDICATIONS:   Allergies as of 03/07/2018      Reactions   Penicillins Other (See Comments)   Reaction as a child      Medication List      STOP taking these medications   furosemide 40 MG tablet Commonly known as:  LASIX   lisinopril 2.5 MG tablet Commonly known as:  PRINIVIL,ZESTRIL   metFORMIN 500 MG 24 hr tablet Commonly known as:  GLUCOPHAGE-XR     TAKE these medications   amLODipine 5 MG tablet Commonly known as:  NORVASC Take 1 tablet by mouth 2 (two) times daily.   aspirin 81 MG tablet Take 81 mg by mouth daily.   FISH OIL PO Take 1 capsule by mouth daily.   gemfibrozil 600 MG tablet Commonly known as:  LOPID TAKE 1 TABLET BY MOUTH TWICE DAILY BEFORE  MEALS   glucose blood test strip Use as instructed   insulin NPH-regular Human (70-30) 100 UNIT/ML injection Inject 25 Units into the skin 2 (two) times daily with a meal for 30 days. Inject 28 units before breakfast and 38 units before supper What changed:    how much to take  how to take this   Insulin Syringe-Needle U-100 30G X 1/2" 0.5 ML Misc Commonly known as:  B-D INS SYRINGE 0.5CC/30GX1/2" Use to inject insulin twice a day.   latanoprost 0.005 % ophthalmic solution Commonly known as:  XALATAN Place 1 drop into both eyes at bedtime.   metroNIDAZOLE 500 MG tablet Commonly known as:  FLAGYL Take 1 tablet (500 mg total) by mouth 3 (three) times daily for 5 days.   tamsulosin 0.4 MG Caps capsule Commonly known as:  FLOMAX Take 0.4 mg by mouth daily.   Vitamin D (Ergocalciferol) 1.25 MG (50000 UT) Caps capsule Commonly known as:  DRISDOL TAKE 1 CAPSULE BY MOUTH ONCE A WEEK        DISCHARGE INSTRUCTIONS:   DIET:  Diabetic diet DISCHARGE CONDITION:  Stable ACTIVITY:  Activity as tolerated OXYGEN:  Home Oxygen: No.  Oxygen Delivery: room air DISCHARGE LOCATION:  home   If you experience worsening of your admission symptoms, develop shortness of breath, life threatening emergency, suicidal or homicidal thoughts you must seek medical attention immediately by calling 911 or calling your MD immediately  if symptoms less  severe.  You Must read complete instructions/literature along with all the possible adverse reactions/side effects for all the Medicines you take and that have been prescribed to you. Take any new Medicines after you have completely understood and accpet all the possible adverse reactions/side effects.   Please note  You were cared for by a hospitalist during your hospital stay. If you have any questions about your discharge medications or the care you received while you were in the hospital after you are discharged, you can call the unit and asked to speak with the hospitalist on call if the hospitalist that took care of you is not available. Once you are discharged, your primary care physician will handle any further medical issues. Please note that NO REFILLS for any discharge medications will be authorized once you are discharged, as it is imperative that you return to your primary care physician (or establish a relationship with a primary care physician if you do not have one) for your  aftercare needs so that they can reassess your need for medications and monitor your lab values.    On the day of Discharge:  VITAL SIGNS:  Blood pressure (!) 149/67, pulse 66, temperature 99.2 F (37.3 C), temperature source Oral, resp. rate 20, height 5\' 2"  (1.575 m), weight 96.4 kg, SpO2 97 %. PHYSICAL EXAMINATION:  GENERAL:  85 y.o.-year-old patient lying in the bed with no acute distress.  EYES: Pupils equal, round, reactive to light and accommodation. No scleral icterus. Extraocular muscles intact.  HEENT: Head atraumatic, normocephalic. Oropharynx and nasopharynx clear.  NECK:  Supple, no jugular venous distention. No thyroid enlargement, no tenderness.  LUNGS: Normal breath sounds bilaterally, no wheezing, rales,rhonchi or crepitation. No use of accessory muscles of respiration.  CARDIOVASCULAR: S1, S2 normal. No murmurs, rubs, or gallops.  ABDOMEN: Soft, non-tender, non-distended. Bowel sounds  present. No organomegaly or mass.  EXTREMITIES: No pedal edema, cyanosis, or clubbing.  NEUROLOGIC: Cranial nerves II through XII are intact. Muscle strength 5/5 in all extremities. Sensation intact. Gait not checked.  PSYCHIATRIC: The patient is alert and oriented x 3.  SKIN: No obvious rash, lesion, or ulcer.  DATA REVIEW:   CBC Recent Labs  Lab 03/06/18 0522  WBC 7.6  HGB 15.7  HCT 46.3  PLT 172    Chemistries  Recent Labs  Lab 03/03/18 1434  03/07/18 0414  NA 142   < > 146*  K 4.5   < > 3.7  CL 106   < > 119*  CO2 17*   < > 19*  GLUCOSE 161*   < > 69*  BUN 48*   < > 49*  CREATININE 2.22*   < > 2.27*  CALCIUM 9.1   < > 8.2*  MG  --    < > 2.2  AST 20  --   --   ALT 20  --   --   ALKPHOS 83  --   --   BILITOT 1.4*  --   --    < > = values in this interval not displayed.     Microbiology Results  Results for orders placed or performed during the hospital encounter of 03/03/18  Culture, blood (x 2)     Status: None (Preliminary result)   Collection Time: 03/03/18  2:34 PM  Result Value Ref Range Status   Specimen Description BLOOD BLOOD LEFT HAND  Final   Special Requests   Final    BOTTLES DRAWN AEROBIC AND ANAEROBIC Blood Culture results may not be optimal due to an inadequate volume of blood received in culture bottles   Culture   Final    NO GROWTH 3 DAYS Performed at [facility], [address] [place], [place] [address]    Report Status PENDING  Incomplete  Culture, blood (x 2)     Status: None (Preliminary result)   Collection Time: 03/03/18  4:27 PM  Result Value Ref Range Status   Specimen Description BLOOD RIGHT AC  Final   Special Requests   Final    BOTTLES DRAWN AEROBIC AND ANAEROBIC Blood Culture results may not be optimal due to an inadequate volume of blood received in culture bottles   Culture   Final    NO GROWTH 3 DAYS Performed at [facility], [address], [place], [place] [address]    Report Status PENDING   Incomplete  Urine Culture     Status: None   Collection Time: 03/04/18 11:08 AM  Result Value  Ref Range Status   Specimen Description   Final    URINE, RANDOM Performed at [facility], [address], [place], [place] [address]    Special Requests   Final    NONE Performed at [facility], [address] [place], [place] [address]    Culture   Final    NO GROWTH Performed at [facility], [place] [address], [place], [place] [address]    Report Status 03/05/2018 FINAL  Final    RADIOLOGY:  Dg Hip Unilat With Pelvis 2-3 Views Left  Result Date: 03/06/2018 CLINICAL DATA:  Bilateral hip pain after fall 2 months ago. EXAM: DG HIP (WITH OR WITHOUT PELVIS) 2-3V LEFT COMPARISON:  None. FINDINGS: There is no evidence of hip fracture or dislocation. There is no evidence of arthropathy or other focal bone abnormality. IMPRESSION: Negative. Electronically Signed   By: Marijo Conception, [name]   On: 03/06/2018 13:38   Dg Hip Unilat With Pelvis 2-3 Views Right  Result Date: 03/06/2018 CLINICAL DATA:  Bilateral hip pain after fall 2 months ago. EXAM: DG HIP (WITH OR WITHOUT PELVIS) 2-3V RIGHT COMPARISON:  None. FINDINGS: There is no evidence of hip fracture or dislocation. There is no evidence of arthropathy or other focal bone abnormality. IMPRESSION: Negative. Electronically Signed   By: Marijo Conception, [name]   On: 03/06/2018 13:37     Management plans discussed with the patient, family and they are in agreement.  CODE STATUS: Full Code   TOTAL TIME TAKING CARE OF THIS PATIENT: 36 minutes.    Thania Woodlief M.D on 03/07/2018 at 11:42 AM  Between 7am to 6pm - Pager - [phone]  After 6pm go to [email or web address] - Proofreader  [facility]  Office  [phone]  CC: Primary care physician; Baxter Hire, MD   Note: This dictation was prepared with Dragon dictation along with smaller phrase technology. Any transcriptional errors that  result from this process are unintentional.

## 2018-03-07 NOTE — Progress Notes (Signed)
[facility]  ROUNDING NOTE   Subjective:   Daughter at bedside.   Creatinine 2.27 (2.57)  NS at 150mL/hr  Objective:  Vital signs in last 24 hours:  Temp:  [98.8 F (37.1 C)-99.2 F (37.3 C)] 99.2 F (37.3 C) (02/14 0525) Pulse Rate:  [62-68] 63 (02/14 1142) Resp:  [20-22] 20 (02/14 1142) BP: (148-171)/(67-85) 148/78 (02/14 1142) SpO2:  [97 %-98 %] 97 % (02/14 1142)  Weight change:  Filed Weights   03/03/18 1700  Weight: 96.4 kg    Intake/Output: I/O last 3 completed shifts: In: 3908.5 [P.O.:240; I.V.:2902.7; IV Piggyback:765.8] Out: -    Intake/Output this shift:  No intake/output data recorded.  Physical Exam: General: NAD,   Head: Normocephalic, atraumatic. Moist oral mucosal membranes  Eyes: Anicteric, PERRL  Neck: Supple, trachea midline  Lungs:  Clear to auscultation  Heart: Regular rate and rhythm  Abdomen:  Soft, nontender,   Extremities: no peripheral edema. No tenderness on palpation on groin bilateral  Neurologic: Alert to self and place  Skin: No lesions        Basic Metabolic Panel: Recent Labs  Lab 03/03/18 1434 03/04/18 0531 03/05/18 0404 03/06/18 0522 03/07/18 0414  NA 142 134* 138 143 146*  K 4.5 4.5 4.0 3.8 3.7  CL 106 103 107 115* 119*  CO2 17* 18* 19* 19* 19*  GLUCOSE 161* 372* 242* 130* 69*  BUN 48* 54* 64* 58* 49*  CREATININE 2.22* 2.28* 2.75* 2.57* 2.27*  CALCIUM 9.1 7.8* 7.7* 8.0* 8.2*  MG  --   --  2.4 2.4 2.2  PHOS  --   --  5.7* 4.3  --     Liver Function Tests: Recent Labs  Lab 03/03/18 1434  AST 20  ALT 20  ALKPHOS 83  BILITOT 1.4*  PROT 8.3*  ALBUMIN 4.2   No results for input(s): LIPASE, AMYLASE in the last 168 hours. No results for input(s): AMMONIA in the last 168 hours.  CBC: Recent Labs  Lab 03/03/18 1434 03/04/18 0531 03/05/18 0404 03/06/18 0522  WBC 13.7* 12.1* 9.8 7.6  NEUTROABS 12.0*  --   --   --   HGB 18.6* 17.0 16.1 15.7  HCT 57.9* 50.2 48.0 46.3  MCV 90.9 86.6 87.3  87.4  PLT 203 174 167 172    Cardiac Enzymes: No results for input(s): CKTOTAL, CKMB, CKMBINDEX, TROPONINI in the last 168 hours.  BNP: Invalid input(s): POCBNP  CBG: Recent Labs  Lab 03/06/18 1207 03/06/18 1619 03/06/18 2204 03/07/18 0754 03/07/18 1139  GLUCAP 132* 201* 102* 85 54    Microbiology: Results for orders placed or performed during the hospital encounter of 03/03/18  Culture, blood (x 2)     Status: None (Preliminary result)   Collection Time: 03/03/18  2:34 PM  Result Value Ref Range Status   Specimen Description BLOOD BLOOD LEFT HAND  Final   Special Requests   Final    BOTTLES DRAWN AEROBIC AND ANAEROBIC Blood Culture results may not be optimal due to an inadequate volume of blood received in culture bottles   Culture   Final    NO GROWTH 4 DAYS Performed at [facility], [address] [place], [place] [address]    Report Status PENDING  Incomplete  Culture, blood (x 2)     Status: None (Preliminary result)   Collection Time: 03/03/18  4:27 PM  Result Value Ref Range Status   Specimen Description BLOOD RIGHT [facility]  Final   Special Requests  Final    BOTTLES DRAWN AEROBIC AND ANAEROBIC Blood Culture results may not be optimal due to an inadequate volume of blood received in culture bottles   Culture   Final    NO GROWTH 4 DAYS Performed at [facility], [address] [place], [place] [address]    Report Status PENDING  Incomplete  Urine Culture     Status: None   Collection Time: 03/04/18 11:08 AM  Result Value Ref Range Status   Specimen Description   Final    URINE, RANDOM Performed at [facility], [address] [place], [place] [address]    Special Requests   Final    NONE Performed at [facility], [address], [place], [place] [address]    Culture   Final    NO GROWTH Performed at [facility], [place] [address], [place], [place] [address]    Report Status 03/05/2018 FINAL  Final     Coagulation Studies: No results for input(s): LABPROT, INR in the last 72 hours.  Urinalysis: No results for input(s): COLORURINE, LABSPEC, PHURINE, GLUCOSEU, HGBUR, BILIRUBINUR, KETONESUR, PROTEINUR, UROBILINOGEN, NITRITE, LEUKOCYTESUR in the last 72 hours.  Invalid input(s): APPERANCEUR    Imaging: Dg Hip Unilat With Pelvis 2-3 Views Left  Result Date: 03/06/2018 CLINICAL DATA:  Bilateral hip pain after fall 2 months ago. EXAM: DG HIP (WITH OR WITHOUT PELVIS) 2-3V LEFT COMPARISON:  None. FINDINGS: There is no evidence of hip fracture or dislocation. There is no evidence of arthropathy or other focal bone abnormality. IMPRESSION: Negative. Electronically Signed   By: Marijo Conception, [name]   On: 03/06/2018 13:38   Dg Hip Unilat With Pelvis 2-3 Views Right  Result Date: 03/06/2018 CLINICAL DATA:  Bilateral hip pain after fall 2 months ago. EXAM: DG HIP (WITH OR WITHOUT PELVIS) 2-3V RIGHT COMPARISON:  None. FINDINGS: There is no evidence of hip fracture or dislocation. There is no evidence of arthropathy or other focal bone abnormality. IMPRESSION: Negative. Electronically Signed   By: Marijo Conception, [name]   On: 03/06/2018 13:37     Medications:   . sodium chloride Stopped (03/05/18 0235)  . metronidazole 500 mg (03/07/18 0910)   . amLODipine  5 mg Oral Daily  . aspirin EC  81 mg Oral Daily  . gemfibrozil  600 mg Oral BID AC  . heparin  5,000 Units Subcutaneous Q8H  . insulin aspart  0-5 Units Subcutaneous QHS  . insulin aspart  0-9 Units Subcutaneous TID WC  . insulin aspart protamine- aspart  30 Units Subcutaneous BID WC  . latanoprost  1 drop Both Eyes QHS  . tamsulosin  0.4 mg Oral Daily   sodium chloride, acetaminophen **OR** acetaminophen, albuterol, bisacodyl, hydrALAZINE, HYDROcodone-acetaminophen, ondansetron **OR** ondansetron (ZOFRAN) IV, senna-docusate  Assessment/ Plan:  Mr. Damon [name] is a 85 y.o. white male with diabetes mellitus type II, diabetic  retinopathy,diabetic peripheral neuropathy, history of diabetic foot ulcer, hypertension, hyperlipidemia, deafness, BPH  1. Acute renal failure with metabolic acidosis on chronic kidney disease stage III with microalbuminuria  Baseline creatinine of 1.58, GFR of 40 on 09/11/17 Chronic kidney disease secondary to diabetic nephropathy Acute renal failure secondary to ATN, prerenal azotemia and sepsis Nonoliguric urine outpatient. Unable to have indwelling catheter passed.  - Discontinue IV fluids - holding outpatient lisinopril.   2. Sepsis: source unknown. Cultures negative. Suspect colitis.  - metronidazole.   3. Hypertension: with history of difficult to control peripheral edema. Holding furosemide  and lisinopril.   4. Diabetes mellitus type II with chronic kidney disease: insulin dependent holding metformin.    LOS: 4 Jameon Deller 2/14/20203:11 PM

## 2018-03-08 LAB — CULTURE, BLOOD (ROUTINE X 2)
Culture: NO GROWTH
Culture: NO GROWTH

## 2018-03-10 DIAGNOSIS — Z9049 Acquired absence of other specified parts of digestive tract: Secondary | ICD-10-CM | POA: Diagnosis not present

## 2018-03-10 DIAGNOSIS — E669 Obesity, unspecified: Secondary | ICD-10-CM | POA: Diagnosis not present

## 2018-03-10 DIAGNOSIS — E1122 Type 2 diabetes mellitus with diabetic chronic kidney disease: Secondary | ICD-10-CM | POA: Diagnosis not present

## 2018-03-10 DIAGNOSIS — M1990 Unspecified osteoarthritis, unspecified site: Secondary | ICD-10-CM | POA: Diagnosis not present

## 2018-03-10 DIAGNOSIS — Z6838 Body mass index (BMI) 38.0-38.9, adult: Secondary | ICD-10-CM | POA: Diagnosis not present

## 2018-03-10 DIAGNOSIS — M545 Low back pain: Secondary | ICD-10-CM | POA: Diagnosis not present

## 2018-03-10 DIAGNOSIS — N400 Enlarged prostate without lower urinary tract symptoms: Secondary | ICD-10-CM | POA: Diagnosis not present

## 2018-03-10 DIAGNOSIS — W19XXXD Unspecified fall, subsequent encounter: Secondary | ICD-10-CM | POA: Diagnosis not present

## 2018-03-10 DIAGNOSIS — I130 Hypertensive heart and chronic kidney disease with heart failure and stage 1 through stage 4 chronic kidney disease, or unspecified chronic kidney disease: Secondary | ICD-10-CM | POA: Diagnosis not present

## 2018-03-10 DIAGNOSIS — Z7982 Long term (current) use of aspirin: Secondary | ICD-10-CM | POA: Diagnosis not present

## 2018-03-10 DIAGNOSIS — I509 Heart failure, unspecified: Secondary | ICD-10-CM | POA: Diagnosis not present

## 2018-03-10 DIAGNOSIS — E0590 Thyrotoxicosis, unspecified without thyrotoxic crisis or storm: Secondary | ICD-10-CM | POA: Diagnosis not present

## 2018-03-10 DIAGNOSIS — Z9181 History of falling: Secondary | ICD-10-CM | POA: Diagnosis not present

## 2018-03-10 DIAGNOSIS — E781 Pure hyperglyceridemia: Secondary | ICD-10-CM | POA: Diagnosis not present

## 2018-03-10 DIAGNOSIS — E1140 Type 2 diabetes mellitus with diabetic neuropathy, unspecified: Secondary | ICD-10-CM | POA: Diagnosis not present

## 2018-03-10 DIAGNOSIS — M810 Age-related osteoporosis without current pathological fracture: Secondary | ICD-10-CM | POA: Diagnosis not present

## 2018-03-10 DIAGNOSIS — M79604 Pain in right leg: Secondary | ICD-10-CM | POA: Diagnosis not present

## 2018-03-10 DIAGNOSIS — Z794 Long term (current) use of insulin: Secondary | ICD-10-CM | POA: Diagnosis not present

## 2018-03-10 DIAGNOSIS — E785 Hyperlipidemia, unspecified: Secondary | ICD-10-CM | POA: Diagnosis not present

## 2018-03-10 DIAGNOSIS — E213 Hyperparathyroidism, unspecified: Secondary | ICD-10-CM | POA: Diagnosis not present

## 2018-03-10 DIAGNOSIS — H9190 Unspecified hearing loss, unspecified ear: Secondary | ICD-10-CM | POA: Diagnosis not present

## 2018-03-10 DIAGNOSIS — N183 Chronic kidney disease, stage 3 (moderate): Secondary | ICD-10-CM | POA: Diagnosis not present

## 2018-03-11 ENCOUNTER — Other Ambulatory Visit: Payer: Self-pay

## 2018-03-11 NOTE — Patient Outreach (Signed)
[facility]) [place]   03/11/2018  Damon [name] [birth year] [id number]  Reason for referral: Medication Reconciliation Post Discharge  Current insurance:[employer]  PMHx includes but not limited to:  hypertension, diastolic heart failure, type 2 diabetes mellitus, diabetic neuropathy and hyperlipidemia  Outreach:  Successful telephone call with daughter, Ms. Cregger. HIPAA identifiers verified.   Subjective:  Daughter states that her dad has been sleeping more than usual.  She states that his CBGs have been "all out of whack," with some values in the 300's and she reports a hypoglycemic event last night down to the 40s.  She states he went to bed a 6:30 pm and around 9:30pm she heard him "snoring," but that it sound different than normal.  She went to check on him and he only opened one eye and was sticking out his tongue, but could not speak.  She then took his glucose and it was in the 40s and they called 911.  She reports that once EMT had him aroused, that he refused to go to the hospital.  Daughter states that he has a decreased appetite and has not been eating as much as normal.  She states that she thinks he uses the bathroom frequently.  Daughter reports that he will see his PCP on Friday.   Objective: Lab Results  Component Value Date   CREATININE 2.27 (H) 03/07/2018   CREATININE 2.57 (H) 03/06/2018   CREATININE 2.75 (H) 03/05/2018    Lab Results  Component Value Date   HGBA1C 6.7 (H) 03/05/2018    Lipid Panel     Component Value Date/Time   CHOL 145 08/13/2016 1535   CHOL 127 08/22/2013 0240   TRIG 162.0 (H) 08/13/2016 1535   TRIG 96 08/22/2013 0240   HDL 34.40 (L) 08/13/2016 1535   HDL 32 (L) 08/22/2013 0240   CHOLHDL 4 08/13/2016 1535   VLDL 32.4 08/13/2016 1535   VLDL 19 08/22/2013 0240   LDLCALC 78 08/13/2016 1535   LDLCALC 76 08/22/2013 0240    BP Readings from Last 3 Encounters:  03/07/18 (!)  148/78  06/12/17 137/74  11/29/16 (!) 163/66    Allergies  Allergen Reactions  . Penicillins Other (See Comments)    Reaction as a child    Medications Reviewed Today    Reviewed by Dionne Milo, [employer] (Pharmacist) on 03/11/18 at 64  Med List Status: <None>  Medication Order Taking? Sig Documenting Provider Last Dose Status Informant  amLODipine (NORVASC) 5 MG tablet [id number] Yes Take 1 tablet by mouth 2 (two) times daily. [provider] Taking Active Family Member  aspirin 81 MG tablet [id number] Yes Take 81 mg by mouth daily.   [provider] Taking Active Family Member  brimonidine-timolol (COMBIGAN) 0.2-0.5 % ophthalmic solution [id number] Yes Place 1 drop into the left eye at bedtime. [provider] Taking Active Family Member  gemfibrozil (LOPID) 600 MG tablet [id number] Yes TAKE 1 TABLET BY MOUTH TWICE DAILY BEFORE  MEALS Lucille Passy, MD Taking Active Family Member  glucose blood test strip [id number] Yes Use as instructed Lucille Passy, MD Taking Active Family Member  insulin NPH-regular Human (70-30) 100 UNIT/ML injection [id number] Yes Inject 25 Units into the skin 2 (two) times daily with a meal for 30 days. Inject 28 units before breakfast and 38 units before supper Ojie, Jude, MD Taking Active   Insulin Syringe-Needle U-100 (B-D INS SYRINGE 0.5CC/30GX1/2") 30G X 1/2" 0.5 ML  [place] [id number] Yes Use to inject insulin twice a day. Lucille Passy, MD Taking Active Family Member  metroNIDAZOLE (FLAGYL) 500 MG tablet [id number] Yes Take 1 tablet (500 mg total) by mouth 3 (three) times daily for 5 days. Stark Jock Jude, MD Taking Active   Omega-3 Fatty Acids (FISH OIL PO) [id number] Yes Take 1 capsule by mouth daily. [provider] Taking Active Family Member  tamsulosin (FLOMAX) 0.4 MG CAPS capsule [id number] Yes Take 0.4 mg by mouth daily. [provider] Taking Active Family Member           Med Note (Zarie Kosiba, Carver Fila Mar 11, 2018  2:44 PM)    Vitamin D, Ergocalciferol, (DRISDOL) 50000 units CAPS capsule [id number] Yes TAKE 1 CAPSULE BY MOUTH ONCE A WEEK  Patient taking differently:  Takes on Monday   Lucille Passy, MD Taking Active Family Member         ASSESSMENT: Date Discharged from Hospital: 03/07/18 Date Medication Reconciliation Performed: 03/11/2018  Medications:  New at Discharge: Marland Kitchen Metronidazole  Adjusted at Discharge:  . Insulin 70/30  Discontinued at Discharge:   Lisinopril  Furosemide  Metformin  Patient was recently discharged from hospital and all medications have been reviewed.  Assessment:  Drugs sorted by system:  Cardiovascular: amlodipine, aspirin 81 mg, gemfibrozil,   Endocrine: Insulin 70/30  Topical: Combigan  Infectious Diseases: metronidazole  Genitourinary: tamsulosin  Vitamins/Minerals/Supplements: omega-3 fatty acids, ergocalciferol  Medication Review Findings:  . Estimated GFR of 26 mL/min/1.8m2.  Recommendations for Gemfibrozil dose reduction to 75% of dose when GFR between 10 to 50 mL/min.  Consider if his CrCL remains elevated.  . Counseled on symptoms of hypoglycemia, having glucose gel and tablets on hand, checking CBGs more frequently while recovering (especially during the night to make sure he is not having undocumented events), foods to eat if hypoglycemic, making sure that he is eating adequately and to call MD if he has another CBG < 70 mg/dL before Friday's appointment.  Medication Assistance Findings:  No medication assistance needs identified  Plan Call placed to PCP, Dr. Tillman Sers office to inform him of the conversation with daughter.  Will follow up with daughter after PCP visit.  Joetta Manners, PharmD Clinical Pharmacist [place] [phone]

## 2018-03-12 DIAGNOSIS — R5383 Other fatigue: Secondary | ICD-10-CM | POA: Diagnosis not present

## 2018-03-12 DIAGNOSIS — E1149 Type 2 diabetes mellitus with other diabetic neurological complication: Secondary | ICD-10-CM | POA: Diagnosis not present

## 2018-03-12 DIAGNOSIS — Z09 Encounter for follow-up examination after completed treatment for conditions other than malignant neoplasm: Secondary | ICD-10-CM | POA: Diagnosis not present

## 2018-03-14 ENCOUNTER — Encounter (HOSPITAL_COMMUNITY): Payer: Self-pay | Admitting: Emergency Medicine

## 2018-03-14 ENCOUNTER — Ambulatory Visit (HOSPITAL_COMMUNITY)
Admission: EM | Admit: 2018-03-14 | Discharge: 2018-03-14 | Disposition: A | Discharge status: Home / Self Care | Payer: PPO | Attending: Family Medicine | Admitting: Family Medicine

## 2018-03-14 DIAGNOSIS — R609 Edema, unspecified: Secondary | ICD-10-CM | POA: Diagnosis present

## 2018-03-14 DIAGNOSIS — H401131 Primary open-angle glaucoma, bilateral, mild stage: Secondary | ICD-10-CM | POA: Diagnosis not present

## 2018-03-14 LAB — COMPREHENSIVE METABOLIC PANEL
ALT: 21 U/L (ref 0–44)
AST: 16 U/L (ref 15–41)
Albumin: 3.3 g/dL — ABNORMAL LOW (ref 3.5–5.0)
Alkaline Phosphatase: 69 U/L (ref 38–126)
Anion gap: 15 (ref 5–15)
BUN: 33 mg/dL — ABNORMAL HIGH (ref 8–23)
CO2: 17 mmol/L — ABNORMAL LOW (ref 22–32)
Calcium: 9.1 mg/dL (ref 8.9–10.3)
Chloride: 108 mmol/L (ref 98–111)
Creatinine, Ser: 2.26 mg/dL — ABNORMAL HIGH (ref 0.61–1.24)
GFR calc Af Amer: 30 mL/min — ABNORMAL LOW (ref >60)
GFR calc non Af Amer: 26 mL/min — ABNORMAL LOW (ref >60)
Glucose, Bld: 258 mg/dL — ABNORMAL HIGH (ref 70–99)
Potassium: 4.1 mmol/L (ref 3.5–5.1)
Sodium: 140 mmol/L (ref 135–145)
Total Bilirubin: 0.7 mg/dL (ref 0.3–1.2)
Total Protein: 6.8 g/dL (ref 6.5–8.1)

## 2018-03-14 NOTE — ED Triage Notes (Signed)
Pt presents to [facility] for assessment of two large growths (clear fluid filled sacs) on his left ankle and foot.

## 2018-03-14 NOTE — ED Provider Notes (Signed)
[place]    CSN: [id number] Arrival date & time: 03/14/18  1423     History   Chief Complaint Chief Complaint  Patient presents with  . Blister    HPI Damon [name] is a 85 y.o. male.   Damon [name] presents with daughter with complaints of blisters to left foot. Just noticed these yesterday and have increased in size. Denies any previous similar. Was hospitalized for sepsis related to colitis 2/10. Lasix was stopped at that time due to decreased kidney function. Patient has since developed lower extremity edema. He has poor activity on his feet, uses a walker. No injury or rubbing of the skin to the affected food. He is diabetic. Denies pain. No bleeding. Hx of CKD, CHF, DM, HTN. History obtained from patient daughter.    ROS per HPI.      Past Medical History:  Diagnosis Date  . Arrhythmia    Baseline bradycardia  . Bradycardia   . CHF (congestive heart failure) ([place])   . Chronic kidney disease    B[place]eline creatinine 1.4 to 1.5  . Diabetes mellitus   . Hard of hearing   . Hypertension     Patient Active Problem List   Diagnosis Date Noted  . Sepsis ([place]) 03/03/2018  . 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 Visit for well man health check 08/07/2016  . Mood swings 10/11/2015  . Chronic renal disease 09/11/2013  . Hypoglycemia 09/11/2013  . Diastolic CHF ([place]) 95/18/8416  . Hyperlipidemia 09/09/2013  . Uncontrolled type II diabetes mellitus with nephropathy ([place]) 12/04/2005  . DIABETIC PERIPHERAL NEUROPATHY 12/04/2005  . OBESITY 12/04/2005  . Essential hypertension 12/04/2005    Past Surgical History:  Procedure Laterality Date  . COLONOSCOPY  01/01/2012   Procedure: COLONOSCOPY;  Surgeon: [name], MD;  Location[facility] ENDOSCOPY;  Service: Endoscopy;  Laterality: N/A;  . PARATHYROIDECTOMY    . TONSILLECTOMY         Home Medications    Prior to Admission medications   Medication Sig Start Date End Date Taking? Authorizing Provider    amLODipine (NORVASC) 5 MG tablet Take 1 tablet by mouth 2 (two) times daily.   Yes [provider]  aspirin 81 MG tablet Take 81 mg by mouth daily.     Yes [provider]  brimonidine-timolol (COMBIGAN) 0.2-0.5 % ophthalmic solution Place 1 drop into the left eye at bedtime.   Yes [provider]  gemfibrozil (LOPID) 600 MG tablet TAKE 1 TABLET BY MOUTH TWICE DAILY BEFORE  MEALS 10/21/17  Yes Lucille Passy, MD  insulin NPH-regular Human (70-30) 100 UNIT/ML injection Inject 25 Units into the skin 2 (two) times daily with a meal for 30 days. Inject 28 units before breakfast and 38 units before supper 03/07/18 04/06/18 Yes Ojie, Jude, MD  Insulin Syringe-Needle U-100 (B-D INS SYRINGE 0.5CC/30GX1/2") 30G X 1/2" 0.5 ML MISC Use to inject insulin twice a day. 04/04/15  Yes Lucille Passy, MD  glucose blood test strip Use as instructed 04/04/15   Lucille Passy, MD  Omega-3 Fatty Acids (FISH OIL PO) Take 1 capsule by mouth daily.    [provider]  tamsulosin (FLOMAX) 0.4 MG CAPS capsule Take 0.4 mg by mouth daily. 10/26/12   [provider]  Vitamin D, Ergocalciferol, (DRISDOL) 50000 units CAPS capsule TAKE 1 CAPSULE BY MOUTH ONCE A WEEK Patient taking differently: Takes on Monday 09/09/17   Lucille Passy, MD    Family History Family History  Problem Relation Age of Onset  . Diabetes type II Mother   . Hypertension Mother   . Diabetes type II Father   . Hypertension Father     Social History Social History   Tobacco Use  . Smoking status: Never Smoker  . Smokeless tobacco: Former Systems developer    Types: Chew  . Tobacco comment: chewing more than 40 years/chew 1PPD  Substance Use Topics  . Alcohol use: No    Alcohol/week: 0.0 standard drinks  . Drug use: No     Allergies   Penicillins   Review of Systems Review of Systems   Physical Exam Triage Vital Signs ED Triage Vitals  Enc Vitals Group     BP 03/14/18 1519 (!) 154/74     Pulse Rate  03/14/18 1519 77     Resp 03/14/18 1519 20     Temp 03/14/18 1519 98.3 F (36.8 C)     Temp src --      SpO2 03/14/18 1519 98 %     Weight --      Height --      Head Circumference --      Peak Flow --      Pain Score 03/14/18 1520 0     Pain Loc --      Pain Edu? --      Excl. in [place]? --    No data found.  Updated Vital Signs BP (!) 154/74 (BP Location: Right Arm)   Pulse 77   Temp 98.3 F (36.8 C)   Resp 20   SpO2 98%    Physical Exam Constitutional:      Appearance: He is well-developed.  Cardiovascular:     Rate and Rhythm: Normal rate and regular rhythm.  Pulmonary:     Effort: Pulmonary effort is normal.     Breath sounds: Normal breath sounds.  Musculoskeletal:     Right lower leg: Edema present.     Left lower leg: Edema present.     Comments: +2 pitting edema to bilateral lower extremities; +2 pedal pulses bilaterally; serous filled blisters to anterior and medial left foot, see photo   Skin:    General: Skin is warm and dry.  Neurological:     Mental Status: He is alert. Mental status is at baseline.        Blisters cleansed with alcohol and punctured with 22 g needed to drain clear fluid while keeping skin intact.   UC Treatments / Results  Labs (all labs ordered are listed, but only abnormal results are displayed) Labs Reviewed  COMPREHENSIVE METABOLIC PANEL - Abnormal; Notable for the following components:      Result Value   CO2 17 (*)    Glucose, Bld 258 (*)    BUN 33 (*)    Creatinine, Ser 2.26 (*)    Albumin 3.3 (*)    GFR calc non Af Amer 26 (*)    GFR calc Af Amer 30 (*)    All other components within normal limits    EKG None  Radiology No results found.  Procedures Procedures (including critical care time)  Medications Ordered in UC Medications - No data to display  Initial Impression / Assessment and Plan / UC Course  I have reviewed the triage vital signs and the nursing notes.  Pertinent labs & imaging results  that were available during my care of the patient were reviewed by me and considered in my medical decision making (see chart for details).  Fluid drained in order to decrease pressure and keep skin intact in this diabetic patient. CMP rechecked and remains unchanged from last check while in hospital. Restarted 20mg  lasix with strict instructions to daughter to make follow up next week with PCP. Ace wraps to lower legs placed and instruct to elevate legs and limit sodium in diet. Return precautions provided. Patient daughter verbalized understanding and agreeable to plan.    Final Clinical Impressions(s) / UC Diagnoses   Final diagnoses:  Peripheral edema     Discharge Instructions     Elevate legs.  Use of wraps or compression stockings.  Limit salt in diet.  Restart daily Lasix 20mg  once a day. Please call Monday to make a follow up appointment next week with your primary care provider as may need change in treatment or recheck of labs.  If develop redness, pain or thick drainage of feel please be seen again. If develop increased shortness of breath, congested cough or otherwise working hard to breathe please go to the ER.     ED Prescriptions    None     Controlled Substance Prescriptions  Controlled Substance Registry consulted? Not Applicable   Zigmund Gottron, NP 03/14/18 1950

## 2018-03-14 NOTE — Discharge Instructions (Signed)
Elevate legs.  Use of wraps or compression stockings.  Limit salt in diet.  Restart daily Lasix 20mg  once a day. Please call Monday to make a follow up appointment next week with your primary care provider as may need change in treatment or recheck of labs.  If develop redness, pain or thick drainage of feel please be seen again. If develop increased shortness of breath, congested cough or otherwise working hard to breathe please go to the ER.

## 2018-03-17 ENCOUNTER — Other Ambulatory Visit: Payer: Self-pay

## 2018-03-17 ENCOUNTER — Ambulatory Visit: Payer: Self-pay

## 2018-03-17 DIAGNOSIS — S90822A Blister (nonthermal), left foot, initial encounter: Secondary | ICD-10-CM | POA: Diagnosis not present

## 2018-03-17 DIAGNOSIS — Z794 Long term (current) use of insulin: Secondary | ICD-10-CM | POA: Diagnosis not present

## 2018-03-17 DIAGNOSIS — E1140 Type 2 diabetes mellitus with diabetic neuropathy, unspecified: Secondary | ICD-10-CM | POA: Diagnosis not present

## 2018-03-17 DIAGNOSIS — S90821A Blister (nonthermal), right foot, initial encounter: Secondary | ICD-10-CM | POA: Diagnosis not present

## 2018-03-17 DIAGNOSIS — B351 Tinea unguium: Secondary | ICD-10-CM | POA: Diagnosis not present

## 2018-03-17 DIAGNOSIS — L851 Acquired keratosis [keratoderma] palmaris et plantaris: Secondary | ICD-10-CM | POA: Diagnosis not present

## 2018-03-17 DIAGNOSIS — R600 Localized edema: Secondary | ICD-10-CM | POA: Diagnosis not present

## 2018-03-17 NOTE — Patient Outreach (Signed)
[facility] Care Management  03/17/2018  Damon [name] Jan 23, 1934 [id number]  Successful outreach to [name] daughter, Joya Martyr.  HIPAA identifiers verified.   She states that her father is still having episodes of hypoglycemia (106m/dL this morning), despite his insulin dose being lowered to 30 units BID.  She reports that he is still not eating well. Daughter states that she took him to Urgent Care Friday because he had two large blisters on this left foot that arose during the night.  She reports that the doctor made a small hole, to get the fluid out, but by Saturday morning the fluid had reaccumlulated .  She reports that he was restarted on furosemide 20 mg daily and that she has been wrapping his feet with ACE bandages.  Daughter states that he has a podiatrist appointment and a PCP appointment today.  Plan: Follow up with Ms. Cregger in one week to check on Mr. Ginger CBGs and see if she has medication questions.      Joetta Manners, PharmD Clinical Pharmacist [place] [phone]

## 2018-03-18 ENCOUNTER — Emergency Department: Payer: PPO

## 2018-03-18 ENCOUNTER — Other Ambulatory Visit: Payer: Self-pay

## 2018-03-18 ENCOUNTER — Encounter: Payer: Self-pay | Admitting: Emergency Medicine

## 2018-03-18 ENCOUNTER — Inpatient Hospital Stay
Admission: EM | Admit: 2018-03-18 | Discharge: 2018-03-21 | DRG: 291 | Disposition: A | Discharge status: Home / Self Care | Payer: PPO | Attending: Internal Medicine | Admitting: Internal Medicine

## 2018-03-18 DIAGNOSIS — R601 Generalized edema: Secondary | ICD-10-CM

## 2018-03-18 DIAGNOSIS — Z87891 Personal history of nicotine dependence: Secondary | ICD-10-CM | POA: Diagnosis not present

## 2018-03-18 DIAGNOSIS — R188 Other ascites: Secondary | ICD-10-CM | POA: Diagnosis not present

## 2018-03-18 DIAGNOSIS — J189 Pneumonia, unspecified organism: Secondary | ICD-10-CM | POA: Diagnosis not present

## 2018-03-18 DIAGNOSIS — Z880 Allergy status to penicillin: Secondary | ICD-10-CM

## 2018-03-18 DIAGNOSIS — X58XXXA Exposure to other specified factors, initial encounter: Secondary | ICD-10-CM | POA: Diagnosis present

## 2018-03-18 DIAGNOSIS — L89619 Pressure ulcer of right heel, unspecified stage: Secondary | ICD-10-CM | POA: Diagnosis present

## 2018-03-18 DIAGNOSIS — N401 Enlarged prostate with lower urinary tract symptoms: Secondary | ICD-10-CM | POA: Diagnosis present

## 2018-03-18 DIAGNOSIS — Z833 Family history of diabetes mellitus: Secondary | ICD-10-CM | POA: Diagnosis not present

## 2018-03-18 DIAGNOSIS — S90822A Blister (nonthermal), left foot, initial encounter: Secondary | ICD-10-CM | POA: Diagnosis present

## 2018-03-18 DIAGNOSIS — R0902 Hypoxemia: Secondary | ICD-10-CM | POA: Diagnosis present

## 2018-03-18 DIAGNOSIS — E1122 Type 2 diabetes mellitus with diabetic chronic kidney disease: Secondary | ICD-10-CM | POA: Diagnosis not present

## 2018-03-18 DIAGNOSIS — E162 Hypoglycemia, unspecified: Secondary | ICD-10-CM

## 2018-03-18 DIAGNOSIS — Z8249 Family history of ischemic heart disease and other diseases of the circulatory system: Secondary | ICD-10-CM

## 2018-03-18 DIAGNOSIS — N184 Chronic kidney disease, stage 4 (severe): Secondary | ICD-10-CM | POA: Diagnosis present

## 2018-03-18 DIAGNOSIS — H9190 Unspecified hearing loss, unspecified ear: Secondary | ICD-10-CM | POA: Diagnosis present

## 2018-03-18 DIAGNOSIS — J181 Lobar pneumonia, unspecified organism: Secondary | ICD-10-CM | POA: Diagnosis not present

## 2018-03-18 DIAGNOSIS — Z79899 Other long term (current) drug therapy: Secondary | ICD-10-CM

## 2018-03-18 DIAGNOSIS — D72829 Elevated white blood cell count, unspecified: Secondary | ICD-10-CM | POA: Diagnosis not present

## 2018-03-18 DIAGNOSIS — N320 Bladder-neck obstruction: Secondary | ICD-10-CM | POA: Diagnosis not present

## 2018-03-18 DIAGNOSIS — E11649 Type 2 diabetes mellitus with hypoglycemia without coma: Secondary | ICD-10-CM | POA: Diagnosis not present

## 2018-03-18 DIAGNOSIS — Z7982 Long term (current) use of aspirin: Secondary | ICD-10-CM

## 2018-03-18 DIAGNOSIS — N183 Chronic kidney disease, stage 3 (moderate): Secondary | ICD-10-CM | POA: Diagnosis not present

## 2018-03-18 DIAGNOSIS — I130 Hypertensive heart and chronic kidney disease with heart failure and stage 1 through stage 4 chronic kidney disease, or unspecified chronic kidney disease: Secondary | ICD-10-CM | POA: Diagnosis not present

## 2018-03-18 DIAGNOSIS — R140 Abdominal distension (gaseous): Secondary | ICD-10-CM | POA: Diagnosis not present

## 2018-03-18 DIAGNOSIS — I5033 Acute on chronic diastolic (congestive) heart failure: Secondary | ICD-10-CM | POA: Diagnosis present

## 2018-03-18 DIAGNOSIS — R410 Disorientation, unspecified: Secondary | ICD-10-CM | POA: Diagnosis not present

## 2018-03-18 DIAGNOSIS — Z794 Long term (current) use of insulin: Secondary | ICD-10-CM | POA: Diagnosis not present

## 2018-03-18 DIAGNOSIS — M7989 Other specified soft tissue disorders: Secondary | ICD-10-CM | POA: Diagnosis not present

## 2018-03-18 DIAGNOSIS — R000 Tachycardia, unspecified: Secondary | ICD-10-CM | POA: Diagnosis not present

## 2018-03-18 DIAGNOSIS — E161 Other hypoglycemia: Secondary | ICD-10-CM | POA: Diagnosis not present

## 2018-03-18 DIAGNOSIS — R338 Other retention of urine: Secondary | ICD-10-CM | POA: Diagnosis present

## 2018-03-18 DIAGNOSIS — R600 Localized edema: Secondary | ICD-10-CM

## 2018-03-18 DIAGNOSIS — R339 Retention of urine, unspecified: Secondary | ICD-10-CM | POA: Diagnosis not present

## 2018-03-18 DIAGNOSIS — R9389 Abnormal findings on diagnostic imaging of other specified body structures: Secondary | ICD-10-CM

## 2018-03-18 LAB — URINALYSIS, COMPLETE (UACMP) WITH MICROSCOPIC
Bacteria, UA: NONE SEEN
Bacteria, UA: NONE SEEN
Bilirubin Urine: NEGATIVE
Bilirubin Urine: NEGATIVE
Glucose, UA: NEGATIVE mg/dL
Glucose, UA: 150 mg/dL — AB
Hgb urine dipstick: NEGATIVE
Ketones, ur: NEGATIVE mg/dL
Ketones, ur: NEGATIVE mg/dL
Leukocytes,Ua: NEGATIVE
Leukocytes,Ua: NEGATIVE
Nitrite: NEGATIVE
Nitrite: NEGATIVE
Protein, ur: NEGATIVE mg/dL
Protein, ur: 100 mg/dL — AB
RBC / HPF: >50 RBC/hpf — ABNORMAL HIGH (ref 0–5)
Specific Gravity, Urine: 1.005 (ref 1.005–1.030)
Specific Gravity, Urine: 1.008 (ref 1.005–1.030)
Squamous Epithelial / HPF: NONE SEEN (ref 0–5)
Squamous Epithelial / HPF: NONE SEEN (ref 0–5)
pH: 5 (ref 5.0–8.0)
pH: 6 (ref 5.0–8.0)

## 2018-03-18 LAB — CBC WITH DIFFERENTIAL/PLATELET
Abs Immature Granulocytes: 0.09 10*3/uL — ABNORMAL HIGH (ref 0.00–0.07)
Basophils Absolute: 0.1 10*3/uL (ref 0.0–0.1)
Basophils Relative: 1 %
Eosinophils Absolute: 0.1 10*3/uL (ref 0.0–0.5)
Eosinophils Relative: 1 %
HCT: 49.5 % (ref 39.0–52.0)
Hemoglobin: 16 g/dL (ref 13.0–17.0)
Immature Granulocytes: 1 %
Lymphocytes Relative: 9 %
Lymphs Abs: 1.1 10*3/uL (ref 0.7–4.0)
MCH: 29.1 pg (ref 26.0–34.0)
MCHC: 32.3 g/dL (ref 30.0–36.0)
MCV: 90 fL (ref 80.0–100.0)
Monocytes Absolute: 0.5 10*3/uL (ref 0.1–1.0)
Monocytes Relative: 4 %
Neutro Abs: 10.6 10*3/uL — ABNORMAL HIGH (ref 1.7–7.7)
Neutrophils Relative %: 84 %
Platelets: 253 10*3/uL (ref 150–400)
RBC: 5.5 MIL/uL (ref 4.22–5.81)
RDW: 15.4 % (ref 11.5–15.5)
WBC: 12.5 10*3/uL — ABNORMAL HIGH (ref 4.0–10.5)
nRBC: 0 % (ref 0.0–0.2)

## 2018-03-18 LAB — COMPREHENSIVE METABOLIC PANEL
ALT: 18 U/L (ref 0–44)
AST: 23 U/L (ref 15–41)
Albumin: 3.5 g/dL (ref 3.5–5.0)
Alkaline Phosphatase: 73 U/L (ref 38–126)
Anion gap: 11 (ref 5–15)
BUN: 36 mg/dL — ABNORMAL HIGH (ref 8–23)
CO2: 25 mmol/L (ref 22–32)
Calcium: 9 mg/dL (ref 8.9–10.3)
Chloride: 111 mmol/L (ref 98–111)
Creatinine, Ser: 2.33 mg/dL — ABNORMAL HIGH (ref 0.61–1.24)
GFR calc Af Amer: 29 mL/min — ABNORMAL LOW (ref >60)
GFR calc non Af Amer: 25 mL/min — ABNORMAL LOW (ref >60)
Glucose, Bld: 99 mg/dL (ref 70–99)
Potassium: 3.9 mmol/L (ref 3.5–5.1)
Sodium: 147 mmol/L — ABNORMAL HIGH (ref 135–145)
Total Bilirubin: 0.8 mg/dL (ref 0.3–1.2)
Total Protein: 7.5 g/dL (ref 6.5–8.1)

## 2018-03-18 LAB — GLUCOSE, CAPILLARY
Glucose-Capillary: 84 mg/dL (ref 70–99)
Glucose-Capillary: 140 mg/dL — ABNORMAL HIGH (ref 70–99)
Glucose-Capillary: 66 mg/dL — ABNORMAL LOW (ref 70–99)
Glucose-Capillary: 67 mg/dL — ABNORMAL LOW (ref 70–99)
Glucose-Capillary: 95 mg/dL (ref 70–99)
Glucose-Capillary: 129 mg/dL — ABNORMAL HIGH (ref 70–99)
Glucose-Capillary: 145 mg/dL — ABNORMAL HIGH (ref 70–99)
Glucose-Capillary: 258 mg/dL — ABNORMAL HIGH (ref 70–99)
Glucose-Capillary: 290 mg/dL — ABNORMAL HIGH (ref 70–99)

## 2018-03-18 LAB — BRAIN NATRIURETIC PEPTIDE: B Natriuretic Peptide: 660 pg/mL — ABNORMAL HIGH (ref 0.0–100.0)

## 2018-03-18 LAB — AMMONIA: Ammonia: 15 umol/L (ref 9–35)

## 2018-03-18 LAB — TROPONIN I: Troponin I: 0.03 ng/mL (ref <0.03)

## 2018-03-18 MED ORDER — LIDOCAINE HCL URETHRAL/MUCOSAL 2 % EX GEL
1.0000 "application " | Freq: Once | CUTANEOUS | Status: AC
  Administered 2018-03-18: 1 via URETHRAL
  Filled 2018-03-18: qty 10

## 2018-03-18 MED ORDER — DOCUSATE SODIUM 100 MG PO CAPS: 100.0000 mg | ORAL_CAPSULE | Freq: Two times a day (BID) | ORAL | Status: DC | PRN

## 2018-03-18 MED ORDER — ASPIRIN EC 81 MG PO TBEC
81.0000 mg | DELAYED_RELEASE_TABLET | Freq: Every day | ORAL | Status: DC
  Administered 2018-03-18 – 2018-03-21 (×4): 81 mg via ORAL
  Filled 2018-03-18 (×4): qty 1

## 2018-03-18 MED ORDER — HEPARIN SODIUM (PORCINE) 5000 UNIT/ML IJ SOLN
5000.0000 [IU] | Freq: Three times a day (TID) | INTRAMUSCULAR | Status: DC
  Administered 2018-03-18 – 2018-03-21 (×8): 5000 [IU] via SUBCUTANEOUS
  Filled 2018-03-18 (×8): qty 1

## 2018-03-18 MED ORDER — FUROSEMIDE 10 MG/ML IJ SOLN
20.0000 mg | Freq: Three times a day (TID) | INTRAMUSCULAR | Status: DC
  Administered 2018-03-18 – 2018-03-21 (×9): 20 mg via INTRAVENOUS
  Filled 2018-03-18 (×6): qty 2
  Filled 2018-03-18: qty 4
  Filled 2018-03-18 (×2): qty 2

## 2018-03-18 MED ORDER — DEXTROSE 50 % IV SOLN
1.0000 | Freq: Once | INTRAVENOUS | Status: AC
  Administered 2018-03-18: 50 mL via INTRAVENOUS
  Filled 2018-03-18: qty 50

## 2018-03-18 MED ORDER — TAMSULOSIN HCL 0.4 MG PO CAPS
0.4000 mg | ORAL_CAPSULE | Freq: Every day | ORAL | Status: DC
  Administered 2018-03-18 – 2018-03-19 (×2): 0.4 mg via ORAL
  Filled 2018-03-18 (×2): qty 1

## 2018-03-18 MED ORDER — OMEGA-3-ACID ETHYL ESTERS 1 G PO CAPS
1.0000 g | ORAL_CAPSULE | Freq: Every day | ORAL | Status: DC
  Administered 2018-03-18 – 2018-03-21 (×4): 1 g via ORAL
  Filled 2018-03-18 (×4): qty 1

## 2018-03-18 MED ORDER — BRIMONIDINE TARTRATE-TIMOLOL 0.2-0.5 % OP SOLN
1.0000 [drp] | Freq: Every day | OPHTHALMIC | Status: DC
  Filled 2018-03-18 (×2): qty 5

## 2018-03-18 MED ORDER — AMLODIPINE BESYLATE 5 MG PO TABS
5.0000 mg | ORAL_TABLET | Freq: Two times a day (BID) | ORAL | Status: DC
  Administered 2018-03-19: 5 mg via ORAL
  Filled 2018-03-18 (×2): qty 1

## 2018-03-18 MED ORDER — FINASTERIDE 5 MG PO TABS
5.0000 mg | ORAL_TABLET | Freq: Every day | ORAL | Status: DC
  Administered 2018-03-18 – 2018-03-21 (×4): 5 mg via ORAL
  Filled 2018-03-18 (×5): qty 1

## 2018-03-18 MED ORDER — GEMFIBROZIL 600 MG PO TABS
600.0000 mg | ORAL_TABLET | Freq: Two times a day (BID) | ORAL | Status: DC
  Administered 2018-03-19 – 2018-03-21 (×5): 600 mg via ORAL
  Filled 2018-03-18 (×6): qty 1

## 2018-03-18 MED ORDER — FUROSEMIDE 10 MG/ML IJ SOLN
60.0000 mg | Freq: Once | INTRAMUSCULAR | Status: AC
  Administered 2018-03-18: 60 mg via INTRAVENOUS
  Filled 2018-03-18: qty 8

## 2018-03-18 MED ORDER — INSULIN ASPART PROT & ASPART (70-30 MIX) 100 UNIT/ML ~~LOC~~ SUSP
15.0000 [IU] | Freq: Two times a day (BID) | SUBCUTANEOUS | Status: DC
  Administered 2018-03-19 – 2018-03-21 (×5): 15 [IU] via SUBCUTANEOUS
  Filled 2018-03-18 (×5): qty 10

## 2018-03-18 MED ORDER — SODIUM CHLORIDE 4 MEQ/ML IV SOLN
INTRAVENOUS | Status: DC
  Administered 2018-03-18 – 2018-03-19 (×2): via INTRAVENOUS
  Filled 2018-03-18 (×3): qty 1000

## 2018-03-18 MED ORDER — INSULIN ASPART 100 UNIT/ML ~~LOC~~ SOLN
0.0000 [IU] | Freq: Three times a day (TID) | SUBCUTANEOUS | Status: DC
  Administered 2018-03-18 – 2018-03-19 (×2): 5 [IU] via SUBCUTANEOUS
  Filled 2018-03-18 (×2): qty 1

## 2018-03-18 MED ORDER — DEXTROSE 50 % IV SOLN: 12.5000 g | Freq: Once | INTRAVENOUS | Status: DC

## 2018-03-18 NOTE — Progress Notes (Signed)
Family Meeting Note  Advance Directive:yes  Today a meeting took place with the daughter.  Patient is unable to participate due HN:GITJLL capacity confused   The following clinical team members were present during this meeting:MD  The following were discussed:Patient's diagnosis: CKD, Worsening CHF, Urinary retention, Hypoglycemia, DM, Patient's progosis: Unable to determine and Goals for treatment: Continue present management  Additional follow-up to be provided: Nephrologist.  Time spent during discussion:20 minutes  Vaughan Basta, MD

## 2018-03-18 NOTE — ED Notes (Signed)
Resumed care from [employer].  Pt alert.  Pt waiting on admission bed.  nsr on monitor.  Family at bedside. Pt alert.  Iv in place.

## 2018-03-18 NOTE — ED Notes (Signed)
1236mL emptied from catheter at this time in addition to the  642mL previously emptied. Totaling 1811ml urinary output. catheter clamped at this time in attempt to slow urine output to prevent bladder spasms

## 2018-03-18 NOTE — H&P (Signed)
[place] [facility] [place] NAME: Damon [name]    MR#:  [id number]  DATE OF BIRTH:  Dec 28, 1933  DATE OF ADMISSION:  03/18/2018  PRIMARY CARE PHYSICIAN: Baxter Hire, MD   REQUESTING/REFERRING PHYSICIAN: Quentin Cornwall  CHIEF COMPLAINT:   Chief Complaint  Patient presents with  . Hypoglycemia  . Altered Mental Status  . Leg Swelling  . Ascites    HISTORY OF PRESENT ILLNESS: Damon [name]  is a 85 y.o. male with a known history of arrhythmia, bradycardia, CHF, chronic kidney disease, diabetes, hypertension-was recently recently admitted to hospital with sepsis and at that time Lasix was held due to renal failure.  Patient was sent home with home health and advised to follow with nephrology clinic.  For last few days he has been getting worse with worsening on the swelling on his legs.  He also had some blisters on the legs which were leaking so they took him to the podiatry office who advised to start taking Lasix.  He was noted to have gurgling with breathing by family today and they called ambulance.  He was not noted to be hypoxic by ER but his blood sugar was running borderline and he had edema on the legs so ER suggested to monitor under hospitalist service for further management.  PAST MEDICAL HISTORY:   Past Medical History:  Diagnosis Date  . Arrhythmia    Baseline bradycardia  . Bradycardia   . CHF (congestive heart failure) ([place])   . Chronic kidney disease    BAseline creatinine 1.4 to 1.5  . Diabetes mellitus   . Hard of hearing   . Hypertension     PAST SURGICAL HISTORY:  Past Surgical History:  Procedure Laterality Date  . COLONOSCOPY  01/01/2012   Procedure: COLONOSCOPY;  Surgeon: [name], MD;  Location: [facility] END[facility]COPY;  Service: Endoscopy;  Laterality: N/A;  . PARATHYROIDECTOMY    . TONSILLECTOMY      SOCIAL HISTORY:  Social History   Tobacco Use  . Smoking status: Never Smoker  . Smokeless tobacco: Former Systems developer    Types: Chew  . Tobacco comment: chewing more than 40 years/chew 1PPD  Substance Use Topics  . Alcohol use: No    Alcohol/week: 0.0 standard drinks    FAMILY HISTORY:  Family History  Problem Relation Age of Onset  . Diabetes type II Mother   . Hypertension Mother   . Diabetes type II Father   . Hypertension Father     DRUG ALLERGIES:  Allergies  Allergen Reactions  . Penicillins Other (See Comments)    Reaction as a child    REVIEW OF SYSTEMS:   CONSTITUTIONAL: No fever, have fatigue or weakness.  EYES: No blurred or double vision.  EARS, NOSE, AND THROAT: No tinnitus or ear pain.  RESPIRATORY: No cough,have shortness of breath, no wheezing or hemoptysis.  CARDIOVASCULAR: No chest pain, orthopnea, edema.  GASTROINTESTINAL: No nausea, vomiting, diarrhea or abdominal pain.  GENITOURINARY: No dysuria, hematuria.  ENDOCRINE: No polyuria, nocturia,  HEMATOLOGY: No anemia, easy bruising or bleeding SKIN: No rash or lesion. MUSCULOSKELETAL: No joint pain or arthritis.   NEUROLOGIC: No tingling, numbness, weakness.  PSYCHIATRY: No anxiety or depression.   MEDICATIONS AT HOME:  Prior to Admission medications   Medication Sig Start Date End Date Taking? Authorizing Provider  amLODipine (NORVASC) 5 MG tablet Take 1 tablet by mouth 2 (two) times daily.   Yes [provider]  aspirin 81 MG  tablet Take 81 mg by mouth daily.     Yes [provider]  brimonidine-timolol (COMBIGAN) 0.2-0.5 % ophthalmic solution Place 1 drop into the left eye at bedtime.   Yes [provider]  furosemide (LASIX) 20 MG tablet Take 20 mg by mouth daily. 03/15/18  Yes [provider]  gemfibrozil (LOPID) 600 MG tablet TAKE 1 TABLET BY MOUTH TWICE DAILY BEFORE  MEALS 10/21/17  Yes Lucille Passy, MD  insulin NPH-regular Human (70-30) 100 UNIT/ML injection Inject 25 Units into the skin 2 (two) times daily with a meal for 30 days. Inject 28 units before breakfast and 38 units  before supper Patient taking differently: Inject 25 Units into the skin 2 (two) times daily with a meal. Inject 30 units before breakfast and 30 units before supper 03/07/18 04/06/18 Yes Ojie, Jude, MD  Omega-3 Fatty Acids (FISH OIL PO) Take 1 capsule by mouth daily.   Yes [provider]  tamsulosin (FLOMAX) 0.4 MG CAPS capsule Take 0.4 mg by mouth daily. 10/26/12  Yes [provider]  Vitamin D, Ergocalciferol, (DRISDOL) 50000 units CAPS capsule TAKE 1 CAPSULE BY MOUTH ONCE A WEEK Patient taking differently: Takes on Monday 09/09/17  Yes Lucille Passy, MD  glucose blood test strip Use as instructed 04/04/15   Lucille Passy, MD  Insulin Syringe-Needle U-100 (B-D INS SYRINGE 0.5CC/30GX1/2") 30G X 1/2" 0.5 ML MISC Use to inject insulin twice a day. 04/04/15   Lucille Passy, MD      PHYSICAL EXAMINATION:   VITAL SIGNS: Blood pressure 133/74, pulse 86, temperature 98.6 F (37 C), temperature source Oral, resp. rate (!) 23, height 5\' 2"  (1.575 m), weight 96.6 kg, SpO2 95 %.  GENERAL:  85 y.o.-year-old patient lying in the bed with no acute distress.  EYES: Pupils equal, round, reactive to light and accommodation. No scleral icterus. Extraocular muscles intact.  HEENT: Head atraumatic, normocephalic. Oropharynx and nasopharynx clear.  NECK:  Supple, no jugular venous distention. No thyroid enlargement, no tenderness.  LUNGS: Normal breath sounds bilaterally, no wheezing, rales,rhonchi or crepitation. No use of accessory muscles of respiration.  CARDIOVASCULAR: S1, S2 normal. No murmurs, rubs, or gallops.  ABDOMEN: Soft, nontender, nondistended. Bowel sounds present. No organomegaly or mass.  EXTREMITIES: b/l pedal edema, no cyanosis, or clubbing. Have blisters with bandage on both legs.  NEUROLOGIC: Cranial nerves II through XII are intact. Muscle strength 3-4/5 in all extremities. Sensation intact. Gait not checked.  PSYCHIATRIC: The patient is alert and oriented x 1.  SKIN: No  obvious rash, lesion, or ulcer.   LABORATORY PANEL:   CBC Recent Labs  Lab 03/18/18 0625  WBC 12.5*  HGB 16.0  HCT 49.5  PLT 253  MCV 90.0  MCH 29.1  MCHC 32.3  RDW 15.4  LYMPHSABS 1.1  MONOABS 0.5  EOSABS 0.1  BASOSABS 0.1   ------------------------------------------------------------------------------------------------------------------  Chemistries  Recent Labs  Lab 03/14/18 1635 03/18/18 0625  NA 140 147*  K 4.1 3.9  CL 108 111  CO2 17* 25  GLUCOSE 258* 99  BUN 33* 36*  CREATININE 2.26* 2.33*  CALCIUM 9.1 9.0  AST 16 23  ALT 21 18  ALKPHOS 69 73  BILITOT 0.7 0.8   ------------------------------------------------------------------------------------------------------------------ estimated creatinine clearance is 24.7 mL/min (A) (by C-G formula based on SCr of 2.33 mg/dL (H)). ------------------------------------------------------------------------------------------------------------------ No results for input(s): TSH, T4TOTAL, T3FREE, THYROIDAB in the last 72 hours.  Invalid input(s): FREET3   Coagulation profile No results for input(s): INR,  PROTIME in the last 168 hours. ------------------------------------------------------------------------------------------------------------------- No results for input(s): DDIMER in the last 72 hours. -------------------------------------------------------------------------------------------------------------------  Cardiac Enzymes Recent Labs  Lab 03/18/18 0738  TROPONINI 0.03*   ------------------------------------------------------------------------------------------------------------------ Invalid input(s): POCBNP  ---------------------------------------------------------------------------------------------------------------  Urinalysis    Component Value Date/Time   COLORURINE YELLOW (A) 03/18/2018 0938   APPEARANCEUR CLEAR (A) 03/18/2018 0938   LABSPEC 1.005 03/18/2018 0938   PHURINE 5.0 03/18/2018  0938   GLUCOSEU NEGATIVE 03/18/2018 0938   HGBUR NEGATIVE 03/18/2018 0938   BILIRUBINUR NEGATIVE 03/18/2018 0938   KETONESUR NEGATIVE 03/18/2018 0938   PROTEINUR NEGATIVE 03/18/2018 0938   UROBILINOGEN 0.2 01/11/2012 1225   NITRITE NEGATIVE 03/18/2018 0938   LEUKOCYTESUR NEGATIVE 03/18/2018 0938     RADIOLOGY: Dg Abdomen Acute W/chest  Result Date: 03/18/2018 CLINICAL DATA:  Abdominal distension EXAM: DG ABDOMEN ACUTE W/ 1V CHEST COMPARISON:  None. FINDINGS: Cardiac shadow is enlarged but stable. Aortic calcifications are again seen. Lungs are well aerated bilaterally. Patchy infiltrative changes noted in the left lung base increased from the prior exam consistent with acute infiltrate. Scattered large and small bowel gas is noted. No definitive obstructive pattern is seen. No free air is noted. Degenerative changes of lumbar spine are seen. IMPRESSION: No definitive obstructive pattern is noted. Patchy increased density in the left base when compared with the prior study consistent with evolving infiltrate. Electronically Signed   By: Inez Catalina [name]   On: 03/18/2018 08:48    EKG: Orders placed or performed during the hospital encounter of 03/18/18  . EKG 12-Lead  . EKG 12-Lead    IMPRESSION AND PLAN:  * Ac on ch diastolic CHF   IV lasix, monitor I / o, monitor renal func  * CKD stage 3   Some worsening, monitor - IV lasix use.  Nephrology need to see the pt.  * Leukocytosis   There is some worsening on Lung Xray. There is no fever. Will monitor, and have a low threshold to start Abx.   Cx sent.  * Htn    Cont meds  * Hypoglycemia in DM type 2   Given Dextrose injection   Monitor, decrease 70/30 insulin dose.   Sliding scale insuline.    * urinary retention  Had > 2 ltr in bladder   Had always had dripping of urine and frequency   WIll cont flomax and add finesteride   May need to cont foley on discharge too and follow with urology.   Or try foley removal before  discharge, no UTI noted.  All the records are reviewed and case discussed with ED provider. Management plans discussed with the patient, family and they are in agreement.  CODE STATUS: partial.  Code Status History    Date Active Date Inactive Code Status Order ID Comments User Context   03/03/2018 1400 03/07/2018 1941 Full Code [id number]  Demetrios Loll, MD Inpatient   01/01/2012 1638 01/15/2012 1609 Full Code [id number]  Barton Dubois, MD Inpatient   12/30/2011 2101 01/01/2012 1638 DNR [id number]  Leo Grosser, RN Inpatient   12/30/2011 1733 12/30/2011 2101 Full Code [id number]  Orlie Dakin, MD ED    Advance Directive Documentation     Most Recent Value  Type of Advance Directive  Living will, Healthcare Power of Attorney  Pre-existing out of facility DNR order (yellow form or pink MOST form)  -  "MOST" Form in Place?  -     Family present in room.  TOTAL TIME TAKING CARE OF THIS PATIENT: 67  minutes.    Vaughan Basta M.D on 03/18/2018   Between 7am to 6pm - Pager - [phone]  After 6pm go to [email or web address] - password [email or web address] [place] [facility]  Office  [phone]  CC: Primary care physician; Baxter Hire, MD   Note: This dictation was prepared with Dragon dictation along with smaller phrase technology. Any transcriptional errors that result from this process are unintentional.

## 2018-03-18 NOTE — ED Notes (Signed)
Report called to [facility] rn floor nurse

## 2018-03-18 NOTE — ED Notes (Signed)
PT provided meal tray at this time.

## 2018-03-18 NOTE — ED Notes (Signed)
fsbs 258

## 2018-03-18 NOTE — ED Provider Notes (Addendum)
[facility] Emergency Department Provider Note    First MD Initiated Contact with Patient 03/18/18 [phone]     (approximate)  I have reviewed the triage vital signs and the nursing notes.   HISTORY  Chief Complaint Hypoglycemia; Altered Mental Status; Leg Swelling; and Ascites  Level V Caveat: AMS  HPI Damon [name] is a 85 y.o. male who presents the ER for evaluation of altered mental status.  Patient reportedly having worsening shortness of breath and more confused according to family.  EMS was called as he is having gurgling respirations.  He was placed on his side.  When EMS arrived they did not find to be hypoxic but checked his blood sugar and he was hypoglycemic to the 30s.  He was given D10 with improvement in symptoms and brought to the ER.  Was recently admitted to the hospital for sepsis.  Lasix was stopped due to AKI but has had severe swelling over the past several days.    Past Medical History:  Diagnosis Date  . Arrhythmia    Baseline bradycardia  . Bradycardia   . CHF (congestive heart failure) ([place])   . Chronic kidney disease    BAseline creatinine 1.4 to 1.5  . Diabetes mellitus   . Hard of hearing   . Hypertension    Family History  Problem Relation Age of Onset  . Diabetes type II Mother   . Hypertension Mother   . Diabetes type II Father   . Hypertension Father    Past Surgical History:  Procedure Laterality Date  . COLONOSCOPY  01/01/2012   Procedure: COLONOSCOPY;  Surgeon: [name], MD;  Location: [facility] ENDOSCOPY;  Service: Endoscopy;  Laterality: N/A;  . PARATHYROIDECTOMY    . TONSILLECTOMY     Patient Active Problem List   Diagnosis Date Noted  . Sepsis ([place]) 03/03/2018  . 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 Visit for well man health check 08/07/2016  . Mood swings 10/11/2015  . Chronic renal disease 09/11/2013  . Hypoglycemia 09/11/2013  . Diastolic CHF ([place]) 81/82/9937  . Hyperlipidemia 09/09/2013  .  Uncontrolled type II diabetes mellitus with nephropathy ([place]) 12/04/2005  . DIABETIC PERIPHERAL NEUROPATHY 12/04/2005  . OBESITY 12/04/2005  . Essential hypertension 12/04/2005      Prior to Admission medications   Medication Sig Start Date End Date Taking? Authorizing Provider  amLODipine (NORVASC) 5 MG tablet Take 1 tablet by mouth 2 (two) times daily.    [provider]  aspirin 81 MG tablet Take 81 mg by mouth daily.      [provider]  brimonidine-timolol (COMBIGAN) 0.2-0.5 % ophthalmic solution Place 1 drop into the left eye at bedtime.    [provider]  furosemide (LASIX) 20 MG tablet Take 20 mg by mouth daily. 03/15/18   [provider]  gemfibrozil (LOPID) 600 MG tablet TAKE 1 TABLET BY MOUTH TWICE DAILY BEFORE  MEALS 10/21/17   Lucille Passy, MD  glucose blood test strip Use as instructed 04/04/15   Lucille Passy, MD  insulin NPH-regular Human (70-30) 100 UNIT/ML injection Inject 25 Units into the skin 2 (two) times daily with a meal for 30 days. Inject 28 units before breakfast and 38 units before supper Patient taking differently: Inject 25 Units into the skin 2 (two) times daily with a meal. Inject 30 units before breakfast and 30 units before supper 03/07/18 04/06/18  Otila Back, MD  Insulin Syringe-Needle U-100 (B-D INS SYRINGE 0.5CC/30GX1/2")  30G X 1/2" 0.5 ML MISC Use to inject insulin twice a day. 04/04/15   Lucille Passy, MD  Omega-3 Fatty Acids (FISH OIL PO) Take 1 capsule by mouth daily.    [provider]  tamsulosin (FLOMAX) 0.4 MG CAPS capsule Take 0.4 mg by mouth daily. 10/26/12   [provider]  Vitamin D, Ergocalciferol, (DRISDOL) 50000 units CAPS capsule TAKE 1 CAPSULE BY MOUTH ONCE A WEEK Patient taking differently: Takes on Monday 09/09/17   Lucille Passy, MD    Allergies Penicillins    Social History Social History   Tobacco Use  . Smoking status: Never Smoker  . Smokeless tobacco: Former Systems developer     Types: Chew  . Tobacco comment: chewing more than 40 years/chew 1PPD  Substance Use Topics  . Alcohol use: No    Alcohol/week: 0.0 standard drinks  . Drug use: No    Review of Systems Patient denies headaches, rhinorrhea, blurry vision, numbness, shortness of breath, chest pain, edema, cough, abdominal pain, nausea, vomiting, diarrhea, dysuria, fevers, rashes or hallucinations unless otherwise stated above in HPI. ____________________________________________   PHYSICAL EXAM:  VITAL SIGNS: Vitals:   03/18/18 0626 03/18/18 0700  BP: 113/72 132/76  Pulse: 80 79  Resp: (!) 26 (!) 21  Temp: 98.6 F (37 C)   SpO2: 95% 93%    Constitutional: Alertbut drowsy and ill appearing.  Eyes: Conjunctivae are normal.  Head: Atraumatic. Nose: No congestion/rhinnorhea. Mouth/Throat: Mucous membranes are moist.   Neck: No stridor. Painless ROM.  Cardiovascular: Normal rate, regular rhythm. Grossly normal heart sounds.  Good peripheral circulation. Respiratory: Normal respiratory effort.  No retractions. Lungs with diminished breathsounds posteriorly Gastrointestinal: Soft and nontender.+ distention. No abdominal bruits. No CVA tenderness. Genitourinary: deferred Musculoskeletal: No lower extremity tenderness , 2+ BLE edema.  No joint effusions. Neurologic:  Normal speech and language. No gross focal neurologic deficits are appreciated. No facial droop Skin:  Skin is warm, dry and intact. No rash noted. Psychiatric: Mood and affect are normal. Speech and behavior are normal.  ____________________________________________   LABS (all labs ordered are listed, but only abnormal results are displayed)  Results for orders placed or performed during the hospital encounter of 03/18/18 (from the past 24 hour(s))  Glucose, capillary     Status: None   Collection Time: 03/18/18  6:20 AM  Result Value Ref Range   Glucose-Capillary 84 70 - 99 mg/dL  CBC with Differential     Status: Abnormal    Collection Time: 03/18/18  6:25 AM  Result Value Ref Range   WBC 12.5 (H) 4.0 - 10.5 K/uL   RBC 5.50 4.22 - 5.81 MIL/uL   Hemoglobin 16.0 13.0 - 17.0 g/dL   HCT 49.5 39.0 - 52.0 %   MCV 90.0 80.0 - 100.0 fL   MCH 29.1 26.0 - 34.0 pg   MCHC 32.3 30.0 - 36.0 g/dL   RDW 15.4 11.5 - 15.5 %   Platelets 253 150 - 400 K/uL   nRBC 0.0 0.0 - 0.2 %   Neutrophils Relative % 84 %   Neutro Abs 10.6 (H) 1.7 - 7.7 K/uL   Lymphocytes Relative 9 %   Lymphs Abs 1.1 0.7 - 4.0 K/uL   Monocytes Relative 4 %   Monocytes Absolute 0.5 0.1 - 1.0 K/uL   Eosinophils Relative 1 %   Eosinophils Absolute 0.1 0.0 - 0.5 K/uL   Basophils Relative 1 %   Basophils Absolute 0.1 0.0 - 0.1 K/uL   Immature  Granulocytes 1 %   Abs Immature Granulocytes 0.09 (H) 0.00 - 0.07 K/uL  Comprehensive metabolic panel     Status: Abnormal   Collection Time: 03/18/18  6:25 AM  Result Value Ref Range   Sodium 147 (H) 135 - 145 mmol/L   Potassium 3.9 3.5 - 5.1 mmol/L   Chloride 111 98 - 111 mmol/L   CO2 25 22 - 32 mmol/L   Glucose, Bld 99 70 - 99 mg/dL   BUN 36 (H) 8 - 23 mg/dL   Creatinine, Ser 2.33 (H) 0.61 - 1.24 mg/dL   Calcium 9.0 8.9 - 10.3 mg/dL   Total Protein 7.5 6.5 - 8.1 g/dL   Albumin 3.5 3.5 - 5.0 g/dL   AST 23 15 - 41 U/L   ALT 18 0 - 44 U/L   Alkaline Phosphatase 73 38 - 126 U/L   Total Bilirubin 0.8 0.3 - 1.2 mg/dL   GFR calc non Af Amer 25 (L) >60 mL/min   GFR calc Af Amer 29 (L) >60 mL/min   Anion gap 11 5 - 15  Brain natriuretic peptide     Status: Abnormal   Collection Time: 03/18/18  6:25 AM  Result Value Ref Range   B Natriuretic Peptide 660.0 (H) 0.0 - 100.0 pg/mL  Glucose, capillary     Status: Abnormal   Collection Time: 03/18/18  7:36 AM  Result Value Ref Range   Glucose-Capillary 66 (L) 70 - 99 mg/dL  Ammonia     Status: None   Collection Time: 03/18/18  7:38 AM  Result Value Ref Range   Ammonia 15 9 - 35 umol/L  Glucose, capillary     Status: Abnormal   Collection Time: 03/18/18  7:38  AM  Result Value Ref Range   Glucose-Capillary 67 (L) 70 - 99 mg/dL  Troponin I - Add-On to previous collection     Status: Abnormal   Collection Time: 03/18/18  7:38 AM  Result Value Ref Range   Troponin I 0.03 (HH) <0.03 ng/mL  Glucose, capillary     Status: Abnormal   Collection Time: 03/18/18  8:29 AM  Result Value Ref Range   Glucose-Capillary 140 (H) 70 - 99 mg/dL  Glucose, capillary     Status: None   Collection Time: 03/18/18 10:10 AM  Result Value Ref Range   Glucose-Capillary 95 70 - 99 mg/dL   ____________________________________________  ED ECG REPORT I, Merlyn Lot, the attending physician, personally viewed and interpreted this ECG.   Date: 03/18/2018  EKG Time: 6:22  Rate: 85  Rhythm: sinus  Axis: normal  Intervals: ivcd  ST&T Change: nonsepcific st abn, no stemi  ____________________________________________  RADIOLOGY  I personally reviewed all radiographic images ordered to evaluate for the above acute complaints and reviewed radiology reports and findings.  These findings were personally discussed with the patient.  Please see medical record for radiology report.  ____________________________________________   PROCEDURES  Procedure(s) performed:  .Critical Care Performed by: Merlyn Lot, MD Authorized by: Merlyn Lot, MD   Critical care provider statement:    Critical care time (minutes):  30   Critical care time was exclusive of:  Separately billable procedures and treating other patients   Critical care was necessary to treat or prevent imminent or life-threatening deterioration of the following conditions:  Metabolic crisis   Critical care was time spent personally by me on the following activities:  Development of treatment plan with patient or surrogate, discussions with consultants, evaluation of patient's response to  treatment, examination of patient, obtaining history from patient or surrogate, ordering and performing  treatments and interventions, ordering and review of laboratory studies, ordering and review of radiographic studies, pulse oximetry, re-evaluation of patient's condition and review of [place] performed: yes ____________________________________________   INITIAL IMPRESSION / ASSESSMENT AND PLAN / ED COURSE  Pertinent labs & imaging results that were available during my care of the patient were reviewed by me and considered in my medical decision making (see chart for details).   DDX: Hypoglycemia, sepsis, pneumonia, UTI, AKI, medication effect, dehydration, malnourishment    Damon [name] is a 85 y.o. who presents to the ED with symptoms as described above.  Patient given D50 for recurrent hypoglycemia.  Blood work was sent for the above differential.  Does appear volume overloaded.  The patient will be placed on continuous pulse oximetry and telemetry for monitoring.  Laboratory evaluation will be sent to evaluate for the above complaints.     Clinical Course as of Mar 18 1029  Tue Mar 18, 2018  0912 Bedside ultrasound shows large distended bladder with over 1 L of fluid.  Bladder goes outside of the appearing range but does not show any evidence of abdominal free fluid.  Will insert Foley catheter.   [PR]  1013 Foley inserted with over 1L already drained consistent with acute bladder obstruction which may be worsening his renal function.  Urinalysis pending.   [PR]  1022 Patient with over 1800 cc of urine out.  Will discuss case with hospitalist for admission for diuresis and further medical management.   [PR]    Clinical Course User Index [PR] Merlyn Lot, MD     As part of my medical decision making, I reviewed the following data within the [place] notes reviewed and incorporated, Labs reviewed, notes from prior ED visits and  Controlled Substance Database   ____________________________________________   FINAL  CLINICAL IMPRESSION(S) / ED DIAGNOSES  Final diagnoses:  Hypoglycemia  Generalized edema  Bladder outlet obstruction      NEW MEDICATIONS STARTED DURING THIS VISIT:  New Prescriptions   No medications on file     Note:  This document was prepared using Dragon voice recognition software and may include unintentional dictation errors.    Merlyn Lot, MD 03/18/18 1032    Merlyn Lot, MD 03/27/18 [phone]

## 2018-03-18 NOTE — ED Triage Notes (Addendum)
Pt arrived to the ED via EMS from home for complaints of confusion secondary to hypoglycemia. Pt's family report that the Pt became confused and diaphoretic and when they checked the BG read low. EMS started an IV and gave an AMP of D10 to bring the BG up. Pt's family report increase lower extremity swelling and acitis. Pt is AOx4 in no apparent distress.

## 2018-03-18 NOTE — ED Notes (Signed)
Pt given OJ and peanut butter, graham crackers at this time

## 2018-03-19 ENCOUNTER — Encounter: Payer: Self-pay | Admitting: *Deleted

## 2018-03-19 ENCOUNTER — Observation Stay: Payer: PPO

## 2018-03-19 ENCOUNTER — Inpatient Hospital Stay
Admit: 2018-03-19 | Discharge: 2018-03-19 | Disposition: A | Discharge status: Home / Self Care | Payer: PPO | Attending: Internal Medicine | Admitting: Internal Medicine

## 2018-03-19 DIAGNOSIS — Z880 Allergy status to penicillin: Secondary | ICD-10-CM | POA: Diagnosis not present

## 2018-03-19 DIAGNOSIS — Z79899 Other long term (current) drug therapy: Secondary | ICD-10-CM | POA: Diagnosis not present

## 2018-03-19 DIAGNOSIS — X58XXXA Exposure to other specified factors, initial encounter: Secondary | ICD-10-CM | POA: Diagnosis present

## 2018-03-19 DIAGNOSIS — N401 Enlarged prostate with lower urinary tract symptoms: Secondary | ICD-10-CM | POA: Diagnosis present

## 2018-03-19 DIAGNOSIS — I130 Hypertensive heart and chronic kidney disease with heart failure and stage 1 through stage 4 chronic kidney disease, or unspecified chronic kidney disease: Secondary | ICD-10-CM | POA: Diagnosis present

## 2018-03-19 DIAGNOSIS — R0902 Hypoxemia: Secondary | ICD-10-CM | POA: Diagnosis present

## 2018-03-19 DIAGNOSIS — R338 Other retention of urine: Secondary | ICD-10-CM | POA: Diagnosis present

## 2018-03-19 DIAGNOSIS — J181 Lobar pneumonia, unspecified organism: Secondary | ICD-10-CM | POA: Diagnosis not present

## 2018-03-19 DIAGNOSIS — Z794 Long term (current) use of insulin: Secondary | ICD-10-CM | POA: Diagnosis not present

## 2018-03-19 DIAGNOSIS — H9190 Unspecified hearing loss, unspecified ear: Secondary | ICD-10-CM | POA: Diagnosis present

## 2018-03-19 DIAGNOSIS — I5033 Acute on chronic diastolic (congestive) heart failure: Secondary | ICD-10-CM | POA: Diagnosis present

## 2018-03-19 DIAGNOSIS — J189 Pneumonia, unspecified organism: Secondary | ICD-10-CM | POA: Diagnosis present

## 2018-03-19 DIAGNOSIS — R188 Other ascites: Secondary | ICD-10-CM | POA: Diagnosis present

## 2018-03-19 DIAGNOSIS — Z8249 Family history of ischemic heart disease and other diseases of the circulatory system: Secondary | ICD-10-CM | POA: Diagnosis not present

## 2018-03-19 DIAGNOSIS — Z833 Family history of diabetes mellitus: Secondary | ICD-10-CM | POA: Diagnosis not present

## 2018-03-19 DIAGNOSIS — E11649 Type 2 diabetes mellitus with hypoglycemia without coma: Secondary | ICD-10-CM | POA: Diagnosis present

## 2018-03-19 DIAGNOSIS — N184 Chronic kidney disease, stage 4 (severe): Secondary | ICD-10-CM | POA: Diagnosis present

## 2018-03-19 DIAGNOSIS — E1122 Type 2 diabetes mellitus with diabetic chronic kidney disease: Secondary | ICD-10-CM | POA: Diagnosis present

## 2018-03-19 DIAGNOSIS — Z7982 Long term (current) use of aspirin: Secondary | ICD-10-CM | POA: Diagnosis not present

## 2018-03-19 DIAGNOSIS — S90822A Blister (nonthermal), left foot, initial encounter: Secondary | ICD-10-CM | POA: Diagnosis present

## 2018-03-19 DIAGNOSIS — L89619 Pressure ulcer of right heel, unspecified stage: Secondary | ICD-10-CM | POA: Diagnosis present

## 2018-03-19 DIAGNOSIS — Z87891 Personal history of nicotine dependence: Secondary | ICD-10-CM | POA: Diagnosis not present

## 2018-03-19 DIAGNOSIS — M7989 Other specified soft tissue disorders: Secondary | ICD-10-CM | POA: Diagnosis present

## 2018-03-19 LAB — CBC
HCT: 41.5 % (ref 39.0–52.0)
Hemoglobin: 13.7 g/dL (ref 13.0–17.0)
MCH: 29.1 pg (ref 26.0–34.0)
MCHC: 33 g/dL (ref 30.0–36.0)
MCV: 88.1 fL (ref 80.0–100.0)
Platelets: 183 10*3/uL (ref 150–400)
RBC: 4.71 MIL/uL (ref 4.22–5.81)
RDW: 14.7 % (ref 11.5–15.5)
WBC: 7.6 10*3/uL (ref 4.0–10.5)
nRBC: 0 % (ref 0.0–0.2)

## 2018-03-19 LAB — BASIC METABOLIC PANEL
Anion gap: 10 (ref 5–15)
BUN: 30 mg/dL — ABNORMAL HIGH (ref 8–23)
CO2: 24 mmol/L (ref 22–32)
Calcium: 8.1 mg/dL — ABNORMAL LOW (ref 8.9–10.3)
Chloride: 104 mmol/L (ref 98–111)
Creatinine, Ser: 1.95 mg/dL — ABNORMAL HIGH (ref 0.61–1.24)
GFR calc Af Amer: 36 mL/min — ABNORMAL LOW (ref >60)
GFR calc non Af Amer: 31 mL/min — ABNORMAL LOW (ref >60)
Glucose, Bld: 324 mg/dL — ABNORMAL HIGH (ref 70–99)
Potassium: 3.1 mmol/L — ABNORMAL LOW (ref 3.5–5.1)
Sodium: 138 mmol/L (ref 135–145)

## 2018-03-19 LAB — GLUCOSE, CAPILLARY
Glucose-Capillary: 285 mg/dL — ABNORMAL HIGH (ref 70–99)
Glucose-Capillary: 182 mg/dL — ABNORMAL HIGH (ref 70–99)
Glucose-Capillary: 172 mg/dL — ABNORMAL HIGH (ref 70–99)
Glucose-Capillary: 214 mg/dL — ABNORMAL HIGH (ref 70–99)

## 2018-03-19 LAB — ECHOCARDIOGRAM COMPLETE
Height: 62 in
Weight: 3671.98 oz

## 2018-03-19 MED ORDER — POTASSIUM CHLORIDE CRYS ER 20 MEQ PO TBCR
20.0000 meq | EXTENDED_RELEASE_TABLET | Freq: Two times a day (BID) | ORAL | Status: DC
  Administered 2018-03-19 – 2018-03-21 (×5): 20 meq via ORAL
  Filled 2018-03-19: qty 2
  Filled 2018-03-19 (×4): qty 1

## 2018-03-19 MED ORDER — BRIMONIDINE TARTRATE 0.2 % OP SOLN
1.0000 [drp] | Freq: Every day | OPHTHALMIC | Status: DC
  Administered 2018-03-20: 1 [drp] via OPHTHALMIC
  Filled 2018-03-19: qty 5

## 2018-03-19 MED ORDER — INSULIN ASPART 100 UNIT/ML ~~LOC~~ SOLN
0.0000 [IU] | Freq: Every day | SUBCUTANEOUS | Status: DC
  Administered 2018-03-19: 2 [IU] via SUBCUTANEOUS
  Filled 2018-03-19 (×2): qty 1

## 2018-03-19 MED ORDER — BACITRACIN-NEOMYCIN-POLYMYXIN 400-5-5000 EX OINT
TOPICAL_OINTMENT | Freq: Every day | CUTANEOUS | Status: DC
  Administered 2018-03-19 – 2018-03-21 (×2): via TOPICAL
  Filled 2018-03-19: qty 1
  Filled 2018-03-19: qty 14.17

## 2018-03-19 MED ORDER — TAMSULOSIN HCL 0.4 MG PO CAPS
0.8000 mg | ORAL_CAPSULE | Freq: Every day | ORAL | Status: DC
  Administered 2018-03-20 – 2018-03-21 (×2): 0.8 mg via ORAL
  Filled 2018-03-19 (×3): qty 2

## 2018-03-19 MED ORDER — SODIUM CHLORIDE 0.9 % IV SOLN
2.0000 g | INTRAVENOUS | Status: DC
  Administered 2018-03-19 – 2018-03-20 (×2): 2 g via INTRAVENOUS
  Filled 2018-03-19: qty 2
  Filled 2018-03-19 (×3): qty 20

## 2018-03-19 MED ORDER — FINASTERIDE 5 MG PO TABS: 5.0000 mg | ORAL_TABLET | Freq: Every day | ORAL | Status: DC

## 2018-03-19 MED ORDER — AZITHROMYCIN 250 MG PO TABS
250.0000 mg | ORAL_TABLET | Freq: Every day | ORAL | Status: DC
  Administered 2018-03-20 – 2018-03-21 (×2): 250 mg via ORAL
  Filled 2018-03-19 (×2): qty 1

## 2018-03-19 MED ORDER — TIMOLOL MALEATE 0.5 % OP SOLN
1.0000 [drp] | Freq: Every day | OPHTHALMIC | Status: DC
  Administered 2018-03-20: 1 [drp] via OPHTHALMIC
  Filled 2018-03-19: qty 5

## 2018-03-19 MED ORDER — INSULIN ASPART 100 UNIT/ML ~~LOC~~ SOLN
0.0000 [IU] | Freq: Three times a day (TID) | SUBCUTANEOUS | Status: DC
  Administered 2018-03-19: 12:00:00 2 [IU] via SUBCUTANEOUS
  Administered 2018-03-20: 13:00:00 1 [IU] via SUBCUTANEOUS
  Administered 2018-03-20: 3 [IU] via SUBCUTANEOUS
  Administered 2018-03-21 (×2): 2 [IU] via SUBCUTANEOUS
  Filled 2018-03-19 (×4): qty 1

## 2018-03-19 MED ORDER — AMLODIPINE BESYLATE 5 MG PO TABS
5.0000 mg | ORAL_TABLET | Freq: Every day | ORAL | Status: DC
  Administered 2018-03-20 – 2018-03-21 (×2): 5 mg via ORAL
  Filled 2018-03-19 (×2): qty 1

## 2018-03-19 MED ORDER — AZITHROMYCIN 500 MG PO TABS
500.0000 mg | ORAL_TABLET | Freq: Every day | ORAL | Status: AC
  Administered 2018-03-19: 15:00:00 500 mg via ORAL
  Filled 2018-03-19: qty 1

## 2018-03-19 NOTE — Care Management Note (Signed)
Case Management Note  Patient Details  Name: Damon [name] MRN: [id number] Date of Birth: 12/31/1933  Subjective/Objective:  Admitted to [facility] with bilateral leg edema. Daughter [name] lives in the home [phone]). Seen Dr. Harrel Lemon earlier this week. Prescriptions are filled at [facility] on [address],.  Home health was arranged per [facility] prior to last discharge. [facility] not in network with [facility]. Daughter states doctor's office arranged home health with another agency. [facility]? No skilled nursing. Rolling walker, bedside commode, hospital bed, raised toilet seat, and chair lift in the home. [facility] a few weeks ago. Decreased appetite. Lost 10 pounds x 1 month.  Family will transport.                 Action/Plan: Will check with [place] representative about following [name]   Expected Discharge Date:                  Expected Discharge Plan:     In-House Referral:   yes  Discharge planning Services     Post Acute Care Choice:   yes Choice offered to:     DME Arranged:    DME Agency:     HH Arranged:    HH Agency:     Status of Service:     If discussed at [employer] [facility] [employer], dates discussed:    Additional Comments:  Shelbie Ammons, RN MSN CCM Care Management [phone] 03/19/2018, 8:36 AM

## 2018-03-19 NOTE — Progress Notes (Signed)
Inpatient Diabetes Program Recommendations  AACE/ADA: New Consensus Statement on Inpatient Glycemic Control (2015)  Target Ranges:  Prepandial:   less than 140 mg/dL      Peak postprandial:   less than 180 mg/dL (1-2 hours)      Critically ill patients:  140 - 180 mg/dL   Lab Results  Component Value Date   GLUCAP 285 (H) 03/19/2018   HGBA1C 6.7 (H) 03/05/2018    Review of Glycemic Control Results for Damon, [name] (MRN [id number]) as of 03/19/2018 10:32  Ref. Range 03/18/2018 11:26 03/18/2018 12:51 03/18/2018 16:28 03/18/2018 21:18 03/19/2018 07:40  Glucose-Capillary Latest Ref Range: 70 - 99 mg/dL 129 (H) 145 (H) 258 (H) 290 (H) 285 (H)   Diabetes history: DM 2 Outpatient Diabetes medications:  70/30- 30 units with breakfast and 30 units with supper Current orders for Inpatient glycemic control:  Novolog 70/30 mix 15 units bid Novolog sensitive tid with meals and HS  Inpatient Diabetes Program Recommendations:    Note that patient had hypoglycemia on 03/18/18.  Agree with current orders.  Will follow.   Thanks  Adah Perl, RN, BC-ADM Inpatient Diabetes Coordinator Pager [phone] (8a-5p)

## 2018-03-19 NOTE — Progress Notes (Signed)
*  PRELIMINARY RESULTS* Echocardiogram 2D Echocardiogram has been performed.  Damon [name] 03/19/2018, 2:34 PM

## 2018-03-19 NOTE — Care Management Obs Status (Signed)
[place] NOTIFICATION   Patient Details  Name: Damon [name] MRN: [id number] Date of Birth: 02/07/1933   Medicare Observation Status Notification Given:  Yes    Shelbie Ammons, RN 03/19/2018, 8:03 AM

## 2018-03-19 NOTE — Progress Notes (Signed)
Patient ID: Damon [name], male   DOB: [birth year], 85 y.o.   MRN: [id number]  [employer] PROGRESS NOTE  Damon [name] YQM:[id number] DOB: 08-06-1933 DOA: 03/18/2018 PCP: Baxter Hire, MD  HPI/Subjective: Daughter brought him in with altered mental status and gurgling on secretions and gasping for air.  She is noticing coughing.  She has been following up at the urgent care and with Dr. Cleda Mccreedy podiatry for blisters on the feet.  Patient is not the best historian.  Daughter at the bedside helping out with history and symptoms.  Objective: Vitals:   03/18/18 1932 03/19/18 0439  BP: (!) 114/48 (!) 135/56  Pulse: 70 62  Resp: 18 18  Temp: (!) 97.5 F (36.4 C) 98.4 F (36.9 C)  SpO2: 98% 95%    Filed Weights   03/18/18 0627 03/19/18 0441  Weight: 96.6 kg 104.1 kg    ROS: Review of Systems  Unable to perform ROS: Medical condition  Respiratory: Positive for cough. Negative for shortness of breath.   Cardiovascular: Negative for chest pain.  Gastrointestinal: Negative for abdominal pain, nausea and vomiting.   Exam: Physical Exam  HENT:  Nose: No mucosal edema.  Mouth/Throat: No oropharyngeal exudate or posterior oropharyngeal edema.  Eyes: Pupils are equal, round, and reactive to light. Conjunctivae, EOM and lids are normal.  Neck: No JVD present. Carotid bruit is not present. No edema present. No thyroid mass and no thyromegaly present.  Cardiovascular: S1 normal and S2 normal. Exam reveals no gallop.  No murmur heard. Pulses:      Dorsalis pedis pulses are 2+ on the right side and 2+ on the left side.  Respiratory: No respiratory distress. He has decreased breath sounds in the right lower field and the left lower field. He has no wheezes. He has no rhonchi. He has no rales.  GI: Soft. Bowel sounds are normal. There is no abdominal tenderness.  Musculoskeletal:     Right ankle: He exhibits swelling.     Left ankle: He exhibits swelling.  Lymphadenopathy:    He has no  cervical adenopathy.  Neurological: He is alert.  Skin: Skin is warm. Nails show no clubbing.  Stage II right heel decubiti.  No signs of infection.  2 blisters on the left foot and one on the top of the foot and one on the right medial area above the ankle.  No signs of infection.  Some raw skin underneath the blister that was popped.  Bruising on upper extremities.  Psychiatric: He has a normal mood and affect.      Data Reviewed: Basic Metabolic Panel: Recent Labs  Lab 03/14/18 1635 03/18/18 0625 03/19/18 0424  NA 140 147* 138  K 4.1 3.9 3.1*  CL 108 111 104  CO2 17* 25 24  GLUCOSE 258* 99 324*  BUN 33* 36* 30*  CREATININE 2.26* 2.33* 1.95*  CALCIUM 9.1 9.0 8.1*   Liver Function Tests: Recent Labs  Lab 03/14/18 1635 03/18/18 0625  AST 16 23  ALT 21 18  ALKPHOS 69 73  BILITOT 0.7 0.8  PROT 6.8 7.5  ALBUMIN 3.3* 3.5    Recent Labs  Lab 03/18/18 0738  AMMONIA 15   CBC: Recent Labs  Lab 03/18/18 0625 03/19/18 0424  WBC 12.5* 7.6  NEUTROABS 10.6*  --   HGB 16.0 13.7  HCT 49.5 41.5  MCV 90.0 88.1  PLT 253 183   Cardiac Enzymes: Recent Labs  Lab 03/18/18 0738  TROPONINI 0.03*   BNP (  last 3 results) Recent Labs    03/18/18 0625  BNP 660.0*     CBG: Recent Labs  Lab 03/18/18 1251 03/18/18 1628 03/18/18 2118 03/19/18 0740 03/19/18 1211  GLUCAP 145* 258* 290* 285* 182*    Recent Results (from the past 240 hour(s))  Blood culture (routine x 2)     Status: None (Preliminary result)   Collection Time: 03/18/18  9:38 AM  Result Value Ref Range Status   Specimen Description BLOOD LEFT ANTECUBITAL  Final   Special Requests   Final    BOTTLES DRAWN AEROBIC AND ANAEROBIC Blood Culture adequate volume   Culture   Final    NO GROWTH < 24 HOURS Performed at [facility], [address] [place], [place] [address]    Report Status PENDING  Incomplete  Blood culture (routine x 2)     Status: None (Preliminary result)   Collection Time:  03/18/18 10:03 AM  Result Value Ref Range Status   Specimen Description BLOOD BLOOD RIGHT HAND  Final   Special Requests   Final    BOTTLES DRAWN AEROBIC AND ANAEROBIC Blood Culture adequate volume   Culture   Final    NO GROWTH < 24 HOURS Performed at [facility], [address] [place], [place] [address]    Report Status PENDING  Incomplete     Studies: Dg Chest 2 View  Result Date: 03/19/2018 CLINICAL DATA:  Follow-up left lung infiltrate. EXAM: CHEST - 2 VIEW COMPARISON:  03/18/2018 FINDINGS: Cardiac enlargement. No pleural effusion or edema. Persistent airspace opacification in atelectasis within the left lower lobe. The visualized osseous structures are unremarkable. IMPRESSION: 1. Left lower lobe pneumonia. Electronically Signed   By: Kerby Moors [name]   On: 03/19/2018 08:51   Dg Abdomen Acute W/chest  Result Date: 03/18/2018 CLINICAL DATA:  Abdominal distension EXAM: DG ABDOMEN ACUTE W/ 1V CHEST COMPARISON:  None. FINDINGS: Cardiac shadow is enlarged but stable. Aortic calcifications are again seen. Lungs are well aerated bilaterally. Patchy infiltrative changes noted in the left lung base increased from the prior exam consistent with acute infiltrate. Scattered large and small bowel gas is noted. No definitive obstructive pattern is seen. No free air is noted. Degenerative changes of lumbar spine are seen. IMPRESSION: No definitive obstructive pattern is noted. Patchy increased density in the left base when compared with the prior study consistent with evolving infiltrate. Electronically Signed   By: Inez Catalina [name]   On: 03/18/2018 08:48    Scheduled Meds: . amLODipine  5 mg Oral BID  . aspirin EC  81 mg Oral Daily  . azithromycin  500 mg Oral Daily   Followed by  . [START ON 03/20/2018] azithromycin  250 mg Oral Daily  . brimonidine-timolol  1 drop Left Eye QHS  . dextrose  12.5 g Intravenous Once  . finasteride  5 mg Oral Daily  . furosemide  20 mg  Intravenous Q8H  . gemfibrozil  600 mg Oral BID AC  . heparin  5,000 Units Subcutaneous Q8H  . insulin aspart  0-5 Units Subcutaneous QHS  . insulin aspart  0-9 Units Subcutaneous TID WC  . insulin aspart protamine- aspart  15 Units Subcutaneous BID WC  . omega-3 acid ethyl esters  1 g Oral Daily  . potassium chloride  20 mEq Oral BID  . [START ON 03/20/2018] tamsulosin  0.8 mg Oral Daily   Continuous Infusions: . cefTRIAXone (ROCEPHIN)  IV      Assessment/Plan:  1.  Pneumonia left lower lobe.  Start Rocephin and Zithromax. 2. Acute on chronic diastolic congestive heart failure with lower extremity edema.  Check an echocardiogram.  Daily weights.  Continue IV Lasix for now and monitor renal function. 3. Hypoglycemia.  Get rid of D10 drip.  Monitor sugars with sliding scale only.  Likely will have to bring down the dose of 70/30 insulin with the patient's chronic kidney disease. 4. Chronic kidney disease stage III.  Watch closely with diuresis. 5. Urinary retention.  Foley catheter need to be placed.  Increase Flomax to 0.8 mg daily and continue Proscar. 6. Stage II right heel decubiti.  Heel protectors in bed 7. Left foot blistering.  Nonstick dressing.  No signs of infection.  Cover with nonstick dressing. 8. Hypertension on Norvasc.  Will change to once a day dosing. 9. Weakness we will get physical therapy evaluation.  Daughter doing 24/7 care at home.  Code Status:     Code Status Orders  (From admission, onward)         Start     Ordered   03/18/18 1815  Limited resuscitation (code)  Continuous    Question Answer Comment  In the event of cardiac or respiratory ARREST: Initiate Code Blue, Call Rapid Response Yes   In the event of cardiac or respiratory ARREST: Perform CPR Yes   In the event of cardiac or respiratory ARREST: Perform Intubation/Mechanical Ventilation No   In the event of cardiac or respiratory ARREST: Use NIPPV/BiPAp only if indicated Yes   In the event of  cardiac or respiratory ARREST: Administer ACLS medications if indicated Yes   In the event of cardiac or respiratory ARREST: Perform Defibrillation or Cardioversion if indicated Yes      03/18/18 1814        Code Status History    Date Active Date Inactive Code Status Order ID Comments User Context   03/03/2018 1400 03/07/2018 1941 Full Code [id number]  Demetrios Loll, MD Inpatient   01/01/2012 1638 01/15/2012 1609 Full Code [id number]  Barton Dubois, MD Inpatient   12/30/2011 2101 01/01/2012 1638 DNR [id number]  Leo Grosser, RN Inpatient   12/30/2011 1733 12/30/2011 2101 Full Code [id number]  Orlie Dakin, MD ED    Advance Directive Documentation     Most Recent Value  Type of Advance Directive  Living will, Healthcare Power of Attorney  Pre-existing out of facility DNR order (yellow form or pink MOST form)  -  "MOST" Form in Place?  -     Family Communication: Spoke with daughter at the bedside Disposition Plan: To be determined  Antibiotics:  Rocephin  Zithromax  Time spent: 28 minutes  [place]

## 2018-03-19 NOTE — Progress Notes (Signed)
PT Cancellation Note  Patient Details Name: Damon [name] MRN: [id number] DOB: 01-19-1934   Cancelled Treatment:    Reason Eval/Treat Not Completed: Patient at procedure or test/unavailable, will attempt PT eval at a future date/time as medically appropriate.     [name] PT, DPT 03/19/18, 2:14 PM

## 2018-03-20 LAB — GLUCOSE, CAPILLARY
Glucose-Capillary: 100 mg/dL — ABNORMAL HIGH (ref 70–99)
Glucose-Capillary: 148 mg/dL — ABNORMAL HIGH (ref 70–99)
Glucose-Capillary: 211 mg/dL — ABNORMAL HIGH (ref 70–99)
Glucose-Capillary: 92 mg/dL (ref 70–99)

## 2018-03-20 MED ORDER — HYDROCOD POLST-CPM POLST ER 10-8 MG/5ML PO SUER
5.0000 mL | Freq: Two times a day (BID) | ORAL | Status: DC
  Administered 2018-03-20 – 2018-03-21 (×2): 5 mL via ORAL
  Filled 2018-03-20 (×2): qty 5

## 2018-03-20 MED ORDER — DOCUSATE SODIUM 100 MG PO CAPS
100.0000 mg | ORAL_CAPSULE | Freq: Two times a day (BID) | ORAL | Status: DC
  Administered 2018-03-20 – 2018-03-21 (×3): 100 mg via ORAL
  Filled 2018-03-20 (×3): qty 1

## 2018-03-20 MED ORDER — BENZONATATE 100 MG PO CAPS
200.0000 mg | ORAL_CAPSULE | Freq: Three times a day (TID) | ORAL | Status: DC
  Administered 2018-03-20 – 2018-03-21 (×3): 200 mg via ORAL
  Filled 2018-03-20 (×3): qty 2

## 2018-03-20 NOTE — Progress Notes (Signed)
[place] [facility] [place] NAME: Damon [name]    MR#:  [id number]  DATE OF BIRTH:  30-May-1933  SUBJECTIVE:  CHIEF COMPLAINT:   Chief Complaint  Patient presents with  . Hypoglycemia  . Altered Mental Status  . Leg Swelling  . Ascites   -Patient denies any complaints.  Daughter and grandson at bedside. -Breathing is much improved  REVIEW OF SYSTEMS:  Review of Systems  Constitutional: Negative for chills, fever and malaise/fatigue.  HENT: Negative for congestion, ear discharge, hearing loss and nosebleeds.   Respiratory: Positive for shortness of breath. Negative for cough and wheezing.   Cardiovascular: Negative for chest pain and palpitations.  Gastrointestinal: Negative for abdominal pain, constipation, diarrhea, nausea and vomiting.  Genitourinary: Negative for dysuria.  Musculoskeletal: Negative for myalgias.  Neurological: Negative for dizziness, focal weakness, seizures, weakness and headaches.  Psychiatric/Behavioral: Negative for depression.    DRUG ALLERGIES:   Allergies  Allergen Reactions  . Penicillins Other (See Comments)    Reaction as a child    VITALS:  Blood pressure 126/67, pulse 68, temperature 98.2 F (36.8 C), resp. rate 16, height 5\' 2"  (1.575 m), weight 100.4 kg, SpO2 97 %.  PHYSICAL EXAMINATION:  Physical Exam   GENERAL:  85 y.o.-year-old lady patient lying in the bed with no acute distress.  EYES: Pupils equal, round, reactive to light and accommodation. No scleral icterus. Extraocular muscles intact.  HEENT: Head atraumatic, normocephalic. Oropharynx and nasopharynx clear.  NECK:  Supple, no jugular venous distention. No thyroid enlargement, no tenderness.  LUNGS: Normal breath sounds bilaterally, no wheezing, rales,rhonchi or crepitation. No use of accessory muscles of respiration.  Decreased bibasilar breath sounds CARDIOVASCULAR: S1, S2 normal. No  rubs, or gallops. 2/6 systolic murmur  present ABDOMEN: Soft, nontender, nondistended. Bowel sounds present. No organomegaly or mass.  EXTREMITIES: No pedal edema, cyanosis, or clubbing.  Float boots to the heels NEUROLOGIC: Cranial nerves II through XII are intact. Muscle strength 5/5 in all extremities. Sensation intact. Gait not checked.  PSYCHIATRIC: The patient is alert and oriented x  2.  SKIN: No obvious rash, lesion, or ulcer.    LABORATORY PANEL:   CBC Recent Labs  Lab 03/19/18 0424  WBC 7.6  HGB 13.7  HCT 41.5  PLT 183   ------------------------------------------------------------------------------------------------------------------  Chemistries  Recent Labs  Lab 03/18/18 0625 03/19/18 0424  NA 147* 138  K 3.9 3.1*  CL 111 104  CO2 25 24  GLUCOSE 99 324*  BUN 36* 30*  CREATININE 2.33* 1.95*  CALCIUM 9.0 8.1*  AST 23  --   ALT 18  --   ALKPHOS 73  --   BILITOT 0.8  --    ------------------------------------------------------------------------------------------------------------------  Cardiac Enzymes Recent Labs  Lab 03/18/18 0738  TROPONINI 0.03*   ------------------------------------------------------------------------------------------------------------------  RADIOLOGY:  Dg Chest 2 View  Result Date: 03/19/2018 CLINICAL DATA:  Follow-up left lung infiltrate. EXAM: CHEST - 2 VIEW COMPARISON:  03/18/2018 FINDINGS: Cardiac enlargement. No pleural effusion or edema. Persistent airspace opacification in atelectasis within the left lower lobe. The visualized osseous structures are unremarkable. IMPRESSION: 1. Left lower lobe pneumonia. Electronically Signed   By: Kerby Moors [name]   On: 03/19/2018 08:51    EKG:   Orders placed or performed during the hospital encounter of 03/18/18  . EKG 12-Lead  . EKG 12-Lead    ASSESSMENT AND PLAN:   85 year old male with past medical history significant for CHF, CKD stage IV, diabetes, hypertension  who was just discharged from the hospital was  brought back in secondary to difficulty breathing.  1.  Left lower lobe pneumonia-await blood cultures -Continue Rocephin and azithromycin -Wean off oxygen as tolerated -Much improving  2.  Acute on chronic combined heart failure-echocardiogram with EF down to 45% at this time -Continue daily weights.  Salt restriction -IV Lasix and monitor renal function  3.  Hypoglycemia on admission-was taking high doses of 70/30 insulin.  Dose has been reduced.  Off D10 drip. -Medications needs to be adjusted prior to discharge  4.  CKD stage III-IV, last discharge creatinine was at 2.2, continue monitoring -Nephrology has been consulted.  5.  BPH-on Proscar and Flomax  6.  DVT prophylaxis-on subcutaneous heparin  Physical therapy recommended home health -Daughter updated at bedside     All the records are reviewed and case discussed with Care Management/Social Workerr. Management plans discussed with the patient, family and they are in agreement.  CODE STATUS: Partial Code  TOTAL TIME TAKING CARE OF THIS PATIENT: 38 minutes.   POSSIBLE D/C IN 2 DAYS, DEPENDING ON CLINICAL CONDITION.   Gladstone Lighter M.D on 03/20/2018 at 1:35 PM  Between 7am to 6pm - Pager - [phone]  After 6pm go to [email or web address] - password [email or web address] [facility]  Office  [phone]  CC: Primary care physician; Baxter Hire, MD

## 2018-03-20 NOTE — Evaluation (Signed)
Physical Therapy Evaluation Patient Details Name: Damon [name] MRN: [id number] DOB: [birth year] Today's Date: 03/20/2018   History of Present Illness  From MD H&P: Pt is an 85 y.o. male with a known history of arrhythmia, bradycardia, CHF, chronic kidney disease, diabetes, hypertension-was recently recently admitted to hospital with sepsis.  Patient was sent home with home health and advised to follow with nephrology clinic. Pt was noted to have gurgling with breathing by family and they called ambulance.  He was not noted to be hypoxic by ER but his blood sugar was running borderline and he had edema on the legs so ER suggested to monitor under hospitalist service for further management.  Assessment includes: Pneumonia left lower lobe, Acute on chronic diastolic congestive heart failure with lower extremity edema, hypoglycemia, CKD III, urinary retention, stage II right heel decubiti, left foot blistering, HTN, and weakness.    Clinical Impression  Pt presents with deficits in strength, transfers, mobility, gait, balance, and activity tolerance.  Pt required min A with bed mobility tasks and sit to/from stand transfers from an elevated EOB.  Pt was able to amb 20' with a SW and CGA with slow cadence and short B step length but was steady without LOB.   Pt's SpO2 was 97% with HR 85 bpm after amb compared to baseline of 93% and 70 bpm with no adverse symptoms per pt.  Pt will benefit from HHPT services upon discharge to safely address above deficits for decreased caregiver assistance, decreased risk of further functional decline, and eventual return to PLOF.      Follow Up Recommendations Home health PT;Supervision for mobility/OOB    Equipment Recommendations  None recommended by PT    Recommendations for Other Services       Precautions / Restrictions Precautions Precautions: Fall Restrictions Weight Bearing Restrictions: No      Mobility  Bed Mobility Overal bed mobility: Needs  Assistance Bed Mobility: Supine to Sit;Sit to Supine     Supine to sit: Min assist Sit to supine: Min assist   General bed mobility comments: Log roll technique training with proper body mechanics provided with pt and daughter for decreased caregiver assist and decreased risk of caregiver injury   Transfers Overall transfer level: Needs assistance Equipment used: Standard walker Transfers: Sit to/from Stand Sit to Stand: Min assist;From elevated surface         General transfer comment: Mod verbal and tactile cues for proper sequencing  Ambulation/Gait Ambulation/Gait assistance: Min guard Gait Distance (Feet): 20 Feet Assistive device: Standard walker Gait Pattern/deviations: Step-through pattern;Decreased step length - right;Decreased step length - left Gait velocity: decreased   General Gait Details: Slow cadence and short B step length but steady without LOB.  SpO2 97% with HR 85 bpm after amb compared to baseline of 93% and 70 bpm, no adverse symptoms per pt.  Stairs            Wheelchair Mobility    Modified Rankin (Stroke Patients Only)       Balance Overall balance assessment: Needs assistance Sitting-balance support: No upper extremity supported;Feet supported Sitting balance-Leahy Scale: Good     Standing balance support: Bilateral upper extremity supported Standing balance-Leahy Scale: Fair Standing balance comment: Heavy reliance on the SW in standing                             Pertinent Vitals/Pain Pain Assessment: No/denies pain    Home Living Family/patient  expects to be discharged to:: Private residence(History from daughter at bedside) Living Arrangements: Children(daughter) Available Help at Discharge: Family Type of Home: House Home Access: Level entry     Home Layout: One level Home Equipment: Walker - standard;Hospital bed;Other (comment);Cane - single point(Lift chair)      Prior Function Level of Independence:  Needs assistance   Gait / Transfers Assistance Needed: Pt was independent with ambulation prior to fall about 1 month ago; since then has required 1-2 assist to stand (but able to stand from lift chair on own) and ambulating short household distances with SW and close SBA.  ADL's / Homemaking Assistance Needed: Indep until one month ago when he fell. Since has required extensive assist from family for all aspects of ADL including sponge baths. Daughter has been managing medications since before recent fall  Comments: Two falls in the last 2 months.     Hand Dominance   Dominant Hand: Right    Extremity/Trunk Assessment   Upper Extremity Assessment Upper Extremity Assessment: Generalized weakness    Lower Extremity Assessment Lower Extremity Assessment: Generalized weakness       Communication   Communication: HOH  Cognition Arousal/Alertness: Awake/alert Behavior During Therapy: WFL for tasks assessed/performed Overall Cognitive Status: Within Functional Limits for tasks assessed                                        General Comments      Exercises Other Exercises Other Exercises: Log roll technique training with proper body mechanics provided with pt and daughter Other Exercises: Transfer training with pt and daughter including proper use of lift chair at home to minimize possible loss of strength from over reliance on the lift function   Assessment/Plan    PT Assessment Patient needs continued PT services  PT Problem List Decreased strength;Decreased activity tolerance;Decreased balance;Decreased mobility;Decreased knowledge of use of DME       PT Treatment Interventions DME instruction;Gait training;Functional mobility training;Therapeutic activities;Therapeutic exercise;Balance training;Patient/family education    PT Goals (Current goals can be found in the Care Plan section)  Acute Rehab PT Goals Patient Stated Goal: To get stronger for  decreased caregiver assistance with functional mobility  PT Goal Formulation: With patient/family Time For Goal Achievement: 04/02/18 Potential to Achieve Goals: Fair    Frequency Min 2X/week   Barriers to discharge        Co-evaluation               AM-PAC PT "6 Clicks" Mobility  Outcome Measure Help needed turning from your back to your side while in a flat bed without using bedrails?: A Little Help needed moving from lying on your back to sitting on the side of a flat bed without using bedrails?: A Little Help needed moving to and from a bed to a chair (including a wheelchair)?: A Little Help needed standing up from a chair using your arms (e.g., wheelchair or bedside chair)?: A Little Help needed to walk in hospital room?: A Little Help needed climbing 3-5 steps with a railing? : A Lot 6 Click Score: 17    End of Session Equipment Utilized During Treatment: Gait belt Activity Tolerance: Patient tolerated treatment well Patient left: Other (comment)(Pt left sitting on EOB with nursing assisting pt with meds and linen change) Nurse Communication: Mobility status PT Visit Diagnosis: History of falling (Z91.81);Difficulty in walking, not elsewhere classified (R26.2);Muscle  weakness (generalized) (M62.81)    Time: 7583-0746 PT Time Calculation (min) (ACUTE ONLY): 23 min   Charges:   PT Evaluation $PT Eval Low Complexity: 1 Low PT Treatments $Therapeutic Activity: 8-22 mins        D. Royetta Asal PT, DPT 03/20/18, 9:38 AM

## 2018-03-20 NOTE — Care Management (Signed)
Physical therapy evaluation completed. Recommending home with home health and physical therapy. [place] query completed. One placed on chart. One placed \\in  room so family can review,. Discharge to home tomorrow per Dr. Tressia Miners. Shelbie Ammons RN MSN CCM Care Management [phone]

## 2018-03-21 LAB — BASIC METABOLIC PANEL
Anion gap: 13 (ref 5–15)
BUN: 27 mg/dL — ABNORMAL HIGH (ref 8–23)
CO2: 26 mmol/L (ref 22–32)
Calcium: 8.1 mg/dL — ABNORMAL LOW (ref 8.9–10.3)
Chloride: 98 mmol/L (ref 98–111)
Creatinine, Ser: 1.84 mg/dL — ABNORMAL HIGH (ref 0.61–1.24)
GFR calc Af Amer: 39 mL/min — ABNORMAL LOW (ref >60)
GFR calc non Af Amer: 33 mL/min — ABNORMAL LOW (ref >60)
Glucose, Bld: 218 mg/dL — ABNORMAL HIGH (ref 70–99)
Potassium: 3.2 mmol/L — ABNORMAL LOW (ref 3.5–5.1)
Sodium: 137 mmol/L (ref 135–145)

## 2018-03-21 LAB — GLUCOSE, CAPILLARY
Glucose-Capillary: 165 mg/dL — ABNORMAL HIGH (ref 70–99)
Glucose-Capillary: 170 mg/dL — ABNORMAL HIGH (ref 70–99)
Glucose-Capillary: 96 mg/dL (ref 70–99)

## 2018-03-21 MED ORDER — CEFUROXIME AXETIL 500 MG PO TABS: 500.0000 mg | ORAL_TABLET | Freq: Two times a day (BID) | ORAL | 0 refills | Status: DC

## 2018-03-21 MED ORDER — POTASSIUM CHLORIDE ER 20 MEQ PO TBCR: 20.0000 meq | EXTENDED_RELEASE_TABLET | Freq: Every day | ORAL | 2 refills | Status: AC

## 2018-03-21 MED ORDER — DOCUSATE SODIUM 100 MG PO CAPS: 100.0000 mg | ORAL_CAPSULE | Freq: Two times a day (BID) | ORAL | 0 refills | Status: DC

## 2018-03-21 MED ORDER — BENZONATATE 200 MG PO CAPS: 200.0000 mg | ORAL_CAPSULE | Freq: Three times a day (TID) | ORAL | 0 refills | Status: DC

## 2018-03-21 MED ORDER — BUTAMBEN-TETRACAINE-BENZOCAINE 2-2-14 % EX AERO
1.0000 | INHALATION_SPRAY | Freq: Once | CUTANEOUS | Status: DC
  Filled 2018-03-21: qty 5

## 2018-03-21 MED ORDER — FUROSEMIDE 40 MG PO TABS: 40.0000 mg | ORAL_TABLET | Freq: Every day | ORAL | 2 refills | Status: DC

## 2018-03-21 MED ORDER — HYDROCOD POLST-CPM POLST ER 10-8 MG/5ML PO SUER: 5.0000 mL | Freq: Two times a day (BID) | ORAL | 0 refills | Status: DC

## 2018-03-21 MED ORDER — INSULIN NPH ISOPHANE & REGULAR (70-30) 100 UNIT/ML ~~LOC~~ SUSP: 15.0000 [IU] | Freq: Two times a day (BID) | SUBCUTANEOUS | 3 refills | Status: AC

## 2018-03-21 MED ORDER — FINASTERIDE 5 MG PO TABS: 5.0000 mg | ORAL_TABLET | Freq: Every day | ORAL | 2 refills | Status: AC

## 2018-03-21 MED ORDER — TAMSULOSIN HCL 0.4 MG PO CAPS: 0.8000 mg | ORAL_CAPSULE | Freq: Every day | ORAL | 2 refills | Status: AC

## 2018-03-21 NOTE — Progress Notes (Signed)
Pt unable to pass urine . Bladder scan of 750 ml noted  Coude catheter 32ml  Inserted  With ease  With return of clear yellow urine of 750 ml. Instructions given and demonstration to  dtr  Regarding care of foley catheter.verbalizes understanding.discharged at this time with foley intact.

## 2018-03-21 NOTE — Discharge Summary (Signed)
[place] [facility] [place] NAME: Damon [name]    MR#:  [id number]  DATE OF BIRTH:  1933-12-22  DATE OF ADMISSION:  03/18/2018   ADMITTING PHYSICIAN: Vaughan Basta, MD  DATE OF DISCHARGE:  03/21/18  PRIMARY CARE PHYSICIAN: Baxter Hire, MD   ADMISSION DIAGNOSIS:   Generalized edema [R60.1] Hypoglycemia [E16.2] Infiltrate noted on imaging study [R93.89] Bladder outlet obstruction [N32.0]  DISCHARGE DIAGNOSIS:   Principal Problem:   Bilateral leg edema   SECONDARY DIAGNOSIS:   Past Medical History:  Diagnosis Date  . Arrhythmia    Baseline bradycardia  . Bradycardia   . CHF (congestive heart failure) ([place])   . Chronic kidney disease    BAseline creatinine 1.4 to 1.5  . Diabetes mellitus   . Hard of hearing   . Hypertension     HOSPITAL COURSE:   84 year old male with past medical history significant for CHF, CKD stage IV, diabetes, hypertension who was just discharged from the hospital was brought back in secondary to difficulty breathing.  1.  Left lower lobe pneumonia-negative blood cultures -Received Rocephin and azithromycin-discharged on Ceftin and cough medicines -Off oxygen now -Much improving  2.  Acute on chronic combined heart failure-echocardiogram with EF down to 45% at this time -Continue daily weights.  Salt restriction -Received IV Lasix in the hospital.  Discharged on increased dose of Lasix  3.  Hypoglycemia on admission-was taking high doses of 70/30 insulin.  Dose has been reduced.  Off D10 drip. -Medications adjusted prior to discharge.  Continue to monitor sugars  4.  CKD stage III-IV, last discharge creatinine was at 2.2, continue monitoring -Nephrology has been consulted.  5.  BPH with acute urinary retention in the hospital-needed Foley catheter.  Been performed a voiding trial, patient did not succeed.  Foley catheter can be reinserted at discharge and patient can follow-up with  urology as outpatient. -on Proscar and Flomax  6.    Hypertension-continue home medications.  Patient on Norvasc and Lasix  Physical therapy recommended home health -Daughter updated at bedside Discharged home today   DISCHARGE CONDITIONS:   Guarded  CONSULTS OBTAINED:   None  DRUG ALLERGIES:   Allergies  Allergen Reactions  . Penicillins Other (See Comments)    Reaction as a child   DISCHARGE MEDICATIONS:   Allergies as of 03/21/2018      Reactions   Penicillins Other (See Comments)   Reaction as a child      Medication List    TAKE these medications   amLODipine 5 MG tablet Commonly known as:  NORVASC Take 1 tablet by mouth 2 (two) times daily.   aspirin 81 MG tablet Take 81 mg by mouth daily.   benzonatate 200 MG capsule Commonly known as:  TESSALON Take 1 capsule (200 mg total) by mouth 3 (three) times daily.   cefUROXime 500 MG tablet Commonly known as:  CEFTIN Take 1 tablet (500 mg total) by mouth 2 (two) times daily for 7 days.   chlorpheniramine-HYDROcodone 10-8 MG/5ML Suer Commonly known as:  TUSSIONEX Take 5 mLs by mouth every 12 (twelve) hours.   COMBIGAN 0.2-0.5 % ophthalmic solution Generic drug:  brimonidine-timolol Place 1 drop into the left eye at bedtime.   docusate sodium 100 MG capsule Commonly known as:  COLACE Take 1 capsule (100 mg total) by mouth 2 (two) times daily.   finasteride 5 MG tablet Commonly known as:  PROSCAR Take 1 tablet (5 mg total)  by mouth daily. Start taking on:  March 22, 2018   FISH OIL PO Take 1 capsule by mouth daily.   furosemide 40 MG tablet Commonly known as:  LASIX Take 1 tablet (40 mg total) by mouth daily. What changed:    medication strength  how much to take   gemfibrozil 600 MG tablet Commonly known as:  LOPID TAKE 1 TABLET BY MOUTH TWICE DAILY BEFORE  MEALS   glucose blood test strip Use as instructed   insulin NPH-regular Human (70-30) 100 UNIT/ML injection Inject 15  Units into the skin 2 (two) times daily with a meal. What changed:    how much to take  additional instructions   Insulin Syringe-Needle U-100 30G X 1/2" 0.5 ML Misc Commonly known as:  B-D INS SYRINGE 0.5CC/30GX1/2" Use to inject insulin twice a day.   Potassium Chloride ER 20 MEQ Tbcr Take 20 mEq by mouth daily. While on lasix   tamsulosin 0.4 MG Caps capsule Commonly known as:  FLOMAX Take 2 capsules (0.8 mg total) by mouth daily. What changed:  how much to take   Vitamin D (Ergocalciferol) 1.25 MG (50000 UT) Caps capsule Commonly known as:  DRISDOL TAKE 1 CAPSULE BY MOUTH ONCE A WEEK What changed:    how much to take  how to take this  when to take this  additional instructions        DISCHARGE INSTRUCTIONS:   1.  PCP follow-up in 1 to 2 weeks 2.  Urology follow-up in 1 week for acute urinary retention  DIET:   Cardiac diet  ACTIVITY:   Activity as tolerated  OXYGEN:   Home Oxygen: No.  Oxygen Delivery: room air  DISCHARGE LOCATION:   home   If you experience worsening of your admission symptoms, develop shortness of breath, life threatening emergency, suicidal or homicidal thoughts you must seek medical attention immediately by calling 911 or calling your MD immediately  if symptoms less severe.  You Must read complete instructions/literature along with all the possible adverse reactions/side effects for all the Medicines you take and that have been prescribed to you. Take any new Medicines after you have completely understood and accpet all the possible adverse reactions/side effects.   Please note  You were cared for by a hospitalist during your hospital stay. If you have any questions about your discharge medications or the care you received while you were in the hospital after you are discharged, you can call the unit and asked to speak with the hospitalist on call if the hospitalist that took care of you is not available. Once you are  discharged, your primary care physician will handle any further medical issues. Please note that NO REFILLS for any discharge medications will be authorized once you are discharged, as it is imperative that you return to your primary care physician (or establish a relationship with a primary care physician if you do not have one) for your aftercare needs so that they can reassess your need for medications and monitor your lab values.    On the day of Discharge:  VITAL SIGNS:   Blood pressure (!) 152/59, pulse (!) 50, temperature 98.4 F (36.9 C), temperature source Oral, resp. rate 16, height 5\' 2"  (1.575 m), weight 100 kg, SpO2 94 %.  PHYSICAL EXAMINATION:     GENERAL:  85 y.o.-year-old lady patient lying in the bed with no acute distress.  EYES: Pupils equal, round, reactive to light and accommodation. No scleral icterus. Extraocular muscles  intact.  HEENT: Head atraumatic, normocephalic. Oropharynx and nasopharynx clear.  NECK:  Supple, no jugular venous distention. No thyroid enlargement, no tenderness.  LUNGS: Normal breath sounds bilaterally, no wheezing, rales,rhonchi or crepitation. No use of accessory muscles of respiration.  Decreased bibasilar breath sounds CARDIOVASCULAR: S1, S2 normal. No  rubs, or gallops. 2/6 systolic murmur present ABDOMEN: Soft, nontender, nondistended. Bowel sounds present. No organomegaly or mass.  EXTREMITIES: No pedal edema, cyanosis, or clubbing.  Float boots to the heels NEUROLOGIC: Cranial nerves II through XII are intact. Muscle strength 5/5 in all extremities. Sensation intact. Gait not checked.  PSYCHIATRIC: The patient is alert and oriented x  2.  SKIN: No obvious rash, lesion, or ulcer.   DATA REVIEW:   CBC Recent Labs  Lab 03/19/18 0424  WBC 7.6  HGB 13.7  HCT 41.5  PLT 183    Chemistries  Recent Labs  Lab 03/18/18 0625  03/21/18 0442  NA 147*   < > 137  K 3.9   < > 3.2*  CL 111   < > 98  CO2 25   < > 26  GLUCOSE 99   < >  218*  BUN 36*   < > 27*  CREATININE 2.33*   < > 1.84*  CALCIUM 9.0   < > 8.1*  AST 23  --   --   ALT 18  --   --   ALKPHOS 73  --   --   BILITOT 0.8  --   --    < > = values in this interval not displayed.     Microbiology Results  Results for orders placed or performed during the hospital encounter of 03/18/18  Blood culture (routine x 2)     Status: None (Preliminary result)   Collection Time: 03/18/18  9:38 AM  Result Value Ref Range Status   Specimen Description BLOOD LEFT ANTECUBITAL  Final   Special Requests   Final    BOTTLES DRAWN AEROBIC AND ANAEROBIC Blood Culture adequate volume   Culture   Final    NO GROWTH 3 DAYS Performed at [facility], [address] [place], [place] [address]    Report Status PENDING  Incomplete  Blood culture (routine x 2)     Status: None (Preliminary result)   Collection Time: 03/18/18 10:03 AM  Result Value Ref Range Status   Specimen Description BLOOD BLOOD RIGHT HAND  Final   Special Requests   Final    BOTTLES DRAWN AEROBIC AND ANAEROBIC Blood Culture adequate volume   Culture   Final    NO GROWTH 3 DAYS Performed at [facility], [address], [place], [place] [address]    Report Status PENDING  Incomplete    RADIOLOGY:  No results found.   Management plans discussed with the patient, family and they are in agreement.  CODE STATUS:     Code Status Orders  (From admission, onward)         Start     Ordered   03/18/18 1815  Limited resuscitation (code)  Continuous    Question Answer Comment  In the event of cardiac or respiratory ARREST: Initiate Code Blue, Call Rapid Response Yes   In the event of cardiac or respiratory ARREST: Perform CPR Yes   In the event of cardiac or respiratory ARREST: Perform Intubation/Mechanical Ventilation No   In the event of cardiac or respiratory ARREST: Use NIPPV/BiPAp only if indicated Yes   In the event  of cardiac or respiratory ARREST: Administer ACLS  medications if indicated Yes   In the event of cardiac or respiratory ARREST: Perform Defibrillation or Cardioversion if indicated Yes      03/18/18 1814        Code Status History    Date Active Date Inactive Code Status Order ID Comments User Context   03/03/2018 1400 03/07/2018 1941 Full Code [id number]  Demetrios Loll, MD Inpatient   01/01/2012 1638 01/15/2012 1609 Full Code [id number]  Barton Dubois, MD Inpatient   12/30/2011 2101 01/01/2012 1638 DNR [id number]  Leo Grosser, RN Inpatient   12/30/2011 1733 12/30/2011 2101 Full Code [id number]  Orlie Dakin, MD ED    Advance Directive Documentation     Most Recent Value  Type of Advance Directive  Living will, Healthcare Power of Attorney  Pre-existing out of facility DNR order (yellow form or pink MOST form)  -  "MOST" Form in Place?  -      TOTAL TIME TAKING CARE OF THIS PATIENT: 38 minutes.    Gladstone Lighter M.D on 03/21/2018 at 3:26 PM  Between 7am to 6pm - Pager - [phone]  After 6pm go to [email or web address] - Proofreader  [facility]  Office  [phone]  CC: Primary care physician; Baxter Hire, MD   Note: This dictation was prepared with Dragon dictation along with smaller phrase technology. Any transcriptional errors that result from this process are unintentional.

## 2018-03-21 NOTE — Progress Notes (Signed)
Foley d/cd per order. Alert no resp distress.pt to be discharged home  dtr says she will transport pt by car.

## 2018-03-21 NOTE — Care Management (Signed)
Spoke with daughter at the bedside. States that Dr. Leanne Lovely office had arranged [place] services for the home. Discharge to home today per Dr. Tressia Miners. Shelbie Ammons RN MSN CCM Care Management [phone]

## 2018-03-21 NOTE — Progress Notes (Signed)
Pt for discharge home. No resp distress. Sl d/cd. Rt ac.  Foley d/cd. At 1130 am and waiting for pt to void.  Discharge instructions discussed with pts dtr. presc given  And they and home meds discussed. Diet / activity and f/u discussed.  Verbalized understanding of all plans.

## 2018-03-21 NOTE — Plan of Care (Signed)
  Problem: Spiritual Needs Goal: Ability to function at adequate level Outcome: Progressing   Problem: Education: Goal: Knowledge of General Education information will improve Description Including pain rating scale, medication(s)/side effects and non-pharmacologic comfort measures Outcome: Progressing   Problem: Health Behavior/Discharge Planning: Goal: Ability to manage health-related needs will improve Outcome: Progressing

## 2018-03-21 NOTE — Care Management Important Message (Signed)
Important Message  Patient Details  Name: Damon [name] MRN: [id number] Date of Birth: 1933/03/30   Medicare Important Message Given:  Yes    Juliann Pulse A Velia Pamer 03/21/2018, 11:31 AM

## 2018-03-23 LAB — CULTURE, BLOOD (ROUTINE X 2)
Culture: NO GROWTH
Culture: NO GROWTH
Special Requests: ADEQUATE
Special Requests: ADEQUATE

## 2018-03-24 ENCOUNTER — Ambulatory Visit: Payer: Self-pay

## 2018-03-24 ENCOUNTER — Other Ambulatory Visit: Payer: Self-pay

## 2018-03-24 DIAGNOSIS — I130 Hypertensive heart and chronic kidney disease with heart failure and stage 1 through stage 4 chronic kidney disease, or unspecified chronic kidney disease: Secondary | ICD-10-CM | POA: Diagnosis not present

## 2018-03-24 DIAGNOSIS — M545 Low back pain: Secondary | ICD-10-CM | POA: Diagnosis not present

## 2018-03-24 DIAGNOSIS — M79604 Pain in right leg: Secondary | ICD-10-CM | POA: Diagnosis not present

## 2018-03-24 DIAGNOSIS — E1122 Type 2 diabetes mellitus with diabetic chronic kidney disease: Secondary | ICD-10-CM | POA: Diagnosis not present

## 2018-03-24 DIAGNOSIS — N183 Chronic kidney disease, stage 3 (moderate): Secondary | ICD-10-CM | POA: Diagnosis not present

## 2018-03-24 DIAGNOSIS — I509 Heart failure, unspecified: Secondary | ICD-10-CM | POA: Diagnosis not present

## 2018-03-24 DIAGNOSIS — E785 Hyperlipidemia, unspecified: Secondary | ICD-10-CM | POA: Diagnosis not present

## 2018-03-24 DIAGNOSIS — L97521 Non-pressure chronic ulcer of other part of left foot limited to breakdown of skin: Secondary | ICD-10-CM | POA: Diagnosis not present

## 2018-03-24 DIAGNOSIS — E1140 Type 2 diabetes mellitus with diabetic neuropathy, unspecified: Secondary | ICD-10-CM | POA: Diagnosis not present

## 2018-03-24 NOTE — Patient Outreach (Signed)
[facility]) Care Management  [place]  03/24/2018  Damon [name] February 04, 1933 [id number]  Reason for call: post discharge medication review  Unsuccessful telephone call attempt # 1  to patient.   HIPAA compliant voicemail left requesting a return call.  Plan:  I will make another outreach attempt to patient within 3-4 business days.  Joetta Manners, PharmD Clinical Pharmacist [place] [phone]

## 2018-03-25 DIAGNOSIS — J189 Pneumonia, unspecified organism: Secondary | ICD-10-CM | POA: Diagnosis not present

## 2018-03-25 DIAGNOSIS — N184 Chronic kidney disease, stage 4 (severe): Secondary | ICD-10-CM | POA: Diagnosis not present

## 2018-03-25 DIAGNOSIS — R338 Other retention of urine: Secondary | ICD-10-CM | POA: Diagnosis not present

## 2018-03-25 DIAGNOSIS — E1122 Type 2 diabetes mellitus with diabetic chronic kidney disease: Secondary | ICD-10-CM | POA: Diagnosis not present

## 2018-03-25 DIAGNOSIS — H9190 Unspecified hearing loss, unspecified ear: Secondary | ICD-10-CM | POA: Diagnosis not present

## 2018-03-25 DIAGNOSIS — I509 Heart failure, unspecified: Secondary | ICD-10-CM | POA: Diagnosis not present

## 2018-03-25 DIAGNOSIS — E1129 Type 2 diabetes mellitus with other diabetic kidney complication: Secondary | ICD-10-CM | POA: Diagnosis not present

## 2018-03-25 DIAGNOSIS — Z6841 Body Mass Index (BMI) 40.0 and over, adult: Secondary | ICD-10-CM | POA: Diagnosis not present

## 2018-03-25 DIAGNOSIS — E669 Obesity, unspecified: Secondary | ICD-10-CM | POA: Diagnosis not present

## 2018-03-25 DIAGNOSIS — L97422 Non-pressure chronic ulcer of left heel and midfoot with fat layer exposed: Secondary | ICD-10-CM | POA: Diagnosis not present

## 2018-03-25 DIAGNOSIS — L97522 Non-pressure chronic ulcer of other part of left foot with fat layer exposed: Secondary | ICD-10-CM | POA: Diagnosis not present

## 2018-03-25 DIAGNOSIS — A419 Sepsis, unspecified organism: Secondary | ICD-10-CM | POA: Diagnosis not present

## 2018-03-25 DIAGNOSIS — E1151 Type 2 diabetes mellitus with diabetic peripheral angiopathy without gangrene: Secondary | ICD-10-CM | POA: Diagnosis not present

## 2018-03-25 DIAGNOSIS — I130 Hypertensive heart and chronic kidney disease with heart failure and stage 1 through stage 4 chronic kidney disease, or unspecified chronic kidney disease: Secondary | ICD-10-CM | POA: Diagnosis not present

## 2018-03-25 DIAGNOSIS — I872 Venous insufficiency (chronic) (peripheral): Secondary | ICD-10-CM | POA: Diagnosis not present

## 2018-03-25 DIAGNOSIS — F17200 Nicotine dependence, unspecified, uncomplicated: Secondary | ICD-10-CM | POA: Diagnosis not present

## 2018-03-25 DIAGNOSIS — Z466 Encounter for fitting and adjustment of urinary device: Secondary | ICD-10-CM | POA: Diagnosis not present

## 2018-03-25 DIAGNOSIS — E11621 Type 2 diabetes mellitus with foot ulcer: Secondary | ICD-10-CM | POA: Diagnosis not present

## 2018-03-25 DIAGNOSIS — N401 Enlarged prostate with lower urinary tract symptoms: Secondary | ICD-10-CM | POA: Diagnosis not present

## 2018-03-25 DIAGNOSIS — Z794 Long term (current) use of insulin: Secondary | ICD-10-CM | POA: Diagnosis not present

## 2018-03-25 DIAGNOSIS — M810 Age-related osteoporosis without current pathological fracture: Secondary | ICD-10-CM | POA: Diagnosis not present

## 2018-03-26 ENCOUNTER — Ambulatory Visit (INDEPENDENT_AMBULATORY_CARE_PROVIDER_SITE_OTHER): Payer: HMO | Admitting: Urology

## 2018-03-26 ENCOUNTER — Other Ambulatory Visit: Payer: Self-pay

## 2018-03-26 ENCOUNTER — Encounter: Payer: Self-pay | Admitting: Urology

## 2018-03-26 VITALS — BP 127/61 | HR 75 | Ht 65.0 in | Wt 213.0 lb

## 2018-03-26 DIAGNOSIS — R339 Retention of urine, unspecified: Secondary | ICD-10-CM | POA: Diagnosis not present

## 2018-03-26 LAB — BLADDER SCAN AMB NON-IMAGING

## 2018-03-26 MED ORDER — LIDOCAINE HCL URETHRAL/MUCOSAL 2 % EX GEL
1.0000 "application " | Freq: Once | CUTANEOUS | Status: AC
  Administered 2018-03-26: 1 via URETHRAL

## 2018-03-26 NOTE — Progress Notes (Signed)
03/26/2018 8:33 AM   Damon [name] 01-07-1934 [id number]  Referring provider: Baxter Hire, MD [place], [place] [address]  Chief Complaint  Patient presents with  . Urinary Retention    HPI: 85 year old male presents for evaluation of urinary retention.  He was admitted to [facility] on 03/18/2018 for a left lower lobe pneumonia and acute on chronic congestive heart failure.  He was found to be in urinary retention and had a Foley catheter placed.  He failed a voiding trial prior to discharge and was discharged on 03/21/2018 with an indwelling Foley catheter.  He was hospitalized on 03/03/2018 for possible sepsis and had a CT scan of the abdomen and pelvis which showed a markedly distended bladder but no hydronephrosis.   He saw Dr. Jacqlyn Larsen in 2014 for BPH and symptoms of frequency, urgency, urge incontinence.  He had a PVR of 150 mL.  He remains on tamsulosin and finasteride.  At his most recent hospitalization he did not have sensation of bladder fullness.   PMH: Past Medical History:  Diagnosis Date  . Arrhythmia    Baseline bradycardia  . Bradycardia   . CHF (congestive heart failure) ([place])   . Chronic kidney disease    BAseline creatinine 1.4 to 1.5  . Diabetes mellitus   . Hard of hearing   . Hypertension     Surgical History: Past Surgical History:  Procedure Laterality Date  . COLONOSCOPY  01/01/2012   Procedure: COLONOSCOPY;  Surgeon: [name], MD;  Location: [facility] ENDOSCOPY;  Service: Endoscopy;  Laterality: N/A;  . PARATHYROIDECTOMY    . TONSILLECTOMY      Home Medications:  Allergies as of 03/26/2018      Reactions   Penicillins Other (See Comments)   Reaction as a child      Medication List       Accurate as of March 26, 2018  8:33 AM. Always use your most recent med list.        amLODipine 5 MG tablet Commonly known as:  NORVASC Take 1 tablet by mouth 2 (two) times daily.   aspirin 81 MG tablet Take 81 mg by mouth  daily.   benzonatate 200 MG capsule Commonly known as:  TESSALON Take 1 capsule (200 mg total) by mouth 3 (three) times daily.   cefUROXime 500 MG tablet Commonly known as:  CEFTIN Take 1 tablet (500 mg total) by mouth 2 (two) times daily for 7 days.   chlorpheniramine-HYDROcodone 10-8 MG/5ML Suer Commonly known as:  TUSSIONEX Take 5 mLs by mouth every 12 (twelve) hours.   COMBIGAN 0.2-0.5 % ophthalmic solution Generic drug:  brimonidine-timolol Place 1 drop into the left eye at bedtime.   docusate sodium 100 MG capsule Commonly known as:  COLACE Take 1 capsule (100 mg total) by mouth 2 (two) times daily.   finasteride 5 MG tablet Commonly known as:  PROSCAR Take 1 tablet (5 mg total) by mouth daily.   FISH OIL PO Take 1 capsule by mouth daily.   furosemide 40 MG tablet Commonly known as:  LASIX Take 1 tablet (40 mg total) by mouth daily.   gemfibrozil 600 MG tablet Commonly known as:  LOPID TAKE 1 TABLET BY MOUTH TWICE DAILY BEFORE  MEALS   glucose blood test strip Use as instructed   insulin NPH-regular Human (70-30) 100 UNIT/ML injection Inject 15 Units into the skin 2 (two) times daily with a meal.   Insulin Syringe-Needle U-100 30G X  1/2" 0.5 ML Misc Commonly known as:  B-D INS SYRINGE 0.5CC/30GX1/2" Use to inject insulin twice a day.   Potassium Chloride ER 20 MEQ Tbcr Take 20 mEq by mouth daily. While on lasix   tamsulosin 0.4 MG Caps capsule Commonly known as:  FLOMAX Take 2 capsules (0.8 mg total) by mouth daily.   Vitamin D (Ergocalciferol) 1.25 MG (50000 UT) Caps capsule Commonly known as:  DRISDOL TAKE 1 CAPSULE BY MOUTH ONCE A WEEK       Allergies:  Allergies  Allergen Reactions  . Penicillins Other (See Comments)    Reaction as a child    Family History: Family History  Problem Relation Age of Onset  . Diabetes type II Mother   . Hypertension Mother   . Diabetes type II Father   . Hypertension Father     Social History:   reports that he has never smoked. He has quit using smokeless tobacco.  His smokeless tobacco use included chew. He reports that he does not drink alcohol or use drugs.  ROS: UROLOGY Frequent Urination?: No Hard to postpone urination?: No Burning/pain with urination?: No Get up at night to urinate?: No Leakage of urine?: No Urine stream starts and stops?: No Trouble starting stream?: No Do you have to strain to urinate?: No Blood in urine?: No Urinary tract infection?: No Sexually transmitted disease?: No Injury to kidneys or bladder?: No Painful intercourse?: No Weak stream?: No Erection problems?: No Penile pain?: No  Gastrointestinal Nausea?: No Vomiting?: No Indigestion/heartburn?: No Diarrhea?: No Constipation?: No  Constitutional Fever: No Night sweats?: No Weight loss?: No Fatigue?: No  Skin Skin rash/lesions?: No Itching?: No  Eyes Blurred vision?: No Double vision?: No  Ears/Nose/Throat Sore throat?: No Sinus problems?: No  Hematologic/Lymphatic Swollen glands?: No Easy bruising?: No  Cardiovascular Leg swelling?: No Chest pain?: No  Respiratory Cough?: No Shortness of breath?: No  Endocrine Excessive thirst?: No  Musculoskeletal Back pain?: No Joint pain?: No  Neurological Headaches?: No Dizziness?: No  Psychologic Depression?: No Anxiety?: No  Physical Exam: BP 127/61   Pulse 75   Ht 5\' 5"  (1.651 m)   Wt 213 lb (96.6 kg)   BMI 35.45 kg/m   Constitutional:  Alert and oriented, No acute distress. HEENT: [place] AT, moist mucus membranes.  Trachea midline, no masses. Cardiovascular: No clubbing, cyanosis, or edema. Respiratory: Normal respiratory effort, no increased work of breathing. GI: Abdomen is soft, nontender, nondistended, no abdominal masses GU: Foley catheter draining clear urine. Lymph: No cervical or inguinal lymphadenopathy. Skin: No rashes, bruises or suspicious lesions. Neurologic: Grossly intact, no focal  deficits, moving all 4 extremities. Psychiatric: Normal mood and affect.   Assessment & Plan:   85 year old male with BPH and urinary retention that I suspect is chronic.  He had no sensation of bladder fullness.  His catheter was removed today and his daughter will keep track of his output.  He will return this afternoon for a bladder scan and Foley catheter replacement for persistent retention.   Abbie Sons, [place] [address] [place], [place] [address] [phone]

## 2018-03-26 NOTE — Progress Notes (Signed)
Catheter Removal  Patient is present today for a catheter removal.  35ml of water was drained from the balloon. A 14FR foley cath was removed from the bladder no complications were noted . Patient tolerated well.  Preformed by: Gordy Clement, CMA (AAMA)  Follow up/ Additional notes: RTC this afternoon for PVR    Cath Change/ Replacement  Patient is present today for a catheter change due to urinary retention. Patient was cleaned and prepped in a sterile fashion with betadine and 2% lidocaine jelly was instilled into the urethra. A 16 Coude FR foley cath was replaced into the bladder no complications were noted Urine return was noted 470ml and urine was yellow in color. The balloon was filled with 105ml of sterile water. A night bag was attached for drainage. Patient was given proper instruction on catheter care.    Preformed by: Gordy Clement, CMA   Follow up: RTC in 2 weeks

## 2018-03-27 ENCOUNTER — Telehealth: Payer: Self-pay | Admitting: Urology

## 2018-03-27 NOTE — Telephone Encounter (Signed)
-----   Message from [place], [place] sent at 03/26/2018  4:25 PM EST ----- Regarding: Cysto Per Dr.Stoioff this pt needs to be scheduled for a cysto in 2 weeks.  Thanks

## 2018-03-27 NOTE — Telephone Encounter (Signed)
App made 

## 2018-03-28 ENCOUNTER — Encounter: Payer: Self-pay | Admitting: Urology

## 2018-03-28 ENCOUNTER — Other Ambulatory Visit: Payer: Self-pay

## 2018-03-28 ENCOUNTER — Ambulatory Visit: Payer: Self-pay

## 2018-03-28 NOTE — Patient Outreach (Signed)
[facility]) [place]   03/28/2018  STEVIE CHARTER 1933-06-03 [id number]  Reason for referral: Medication Reconciliation Post Discharge  Current insurance:[employer]  PMHx includes but not limited to:  hypertension, diastolic heart failure, type 2 diabetes mellitus, CKD Stage IV, peripheral neuropathy and hyperglycemia.  Outreach:  Successful telephone call with daughter, Joya Martyr.  HIPAA identifiers verified.   Subjective:  Ms. Cregger reports that her father is starting to get his appetite back slowly.  She states that his sugars have been "wacky" since his insulin dose was decreased.  She reports CBGs between 200-300 mg/dL, with no more hypoglycemia.  She states that she is keeping his feet wounds clean and applying antibiotic ointment and bandaging them.  She states that she has not been weighing her father daily because home health has not brought a scale.  Daughter reports that he will send endocrinology in 2 weeks.   Objective: Lab Results  Component Value Date   CREATININE 1.84 (H) 03/21/2018   CREATININE 1.95 (H) 03/19/2018   CREATININE 2.33 (H) 03/18/2018    Lab Results  Component Value Date   HGBA1C 6.7 (H) 03/05/2018    Lipid Panel     Component Value Date/Time   CHOL 145 08/13/2016 1535   CHOL 127 08/22/2013 0240   TRIG 162.0 (H) 08/13/2016 1535   TRIG 96 08/22/2013 0240   HDL 34.40 (L) 08/13/2016 1535   HDL 32 (L) 08/22/2013 0240   CHOLHDL 4 08/13/2016 1535   VLDL 32.4 08/13/2016 1535   VLDL 19 08/22/2013 0240   LDLCALC 78 08/13/2016 1535   LDLCALC 76 08/22/2013 0240    BP Readings from Last 3 Encounters:  03/26/18 127/61  03/21/18 (!) 146/58  03/14/18 (!) 154/74    Allergies  Allergen Reactions  . Penicillins Other (See Comments)    Reaction as a child    Medications Reviewed Today    Reviewed by Dionne Milo, [facility] (Pharmacist) on 03/28/18 at [place] List Status: <None>   Medication Order Taking? Sig Documenting Provider Last Dose Status Informant  amLODipine (NORVASC) 5 MG tablet [id number] Yes Take 1 tablet by mouth 2 (two) times daily. [provider] Taking Active Family Member  aspirin 81 MG tablet [id number] Yes Take 81 mg by mouth daily.   [provider] Taking Active Family Member  benzonatate (TESSALON) 200 MG capsule [id number] Yes Take 1 capsule (200 mg total) by mouth 3 (three) times daily. Gladstone Lighter, MD Taking Active   brimonidine-timolol (COMBIGAN) 0.2-0.5 % ophthalmic solution [id number] Yes Place 1 drop into the left eye at bedtime. [provider] Taking Active Family Member  chlorpheniramine-HYDROcodone ([place]) 10-8 MG/5ML SUER [id number] Yes Take 5 mLs by mouth every 12 (twelve) hours. Gladstone Lighter, MD Taking Active   docusate sodium (COLACE) 100 MG capsule [id number] Yes Take 1 capsule (100 mg total) by mouth 2 (two) times daily. Gladstone Lighter, MD Taking Active   finasteride (PROSCAR) 5 MG tablet [id number] Yes Take 1 tablet (5 mg total) by mouth daily. Gladstone Lighter, MD Taking Active   furosemide (LASIX) 40 MG tablet [id number] Yes Take 1 tablet (40 mg total) by mouth daily. Gladstone Lighter, MD Taking Active   gemfibrozil (LOPID) 600 MG tablet [id number] Yes TAKE 1 TABLET BY MOUTH TWICE DAILY BEFORE  MEALS Lucille Passy, MD Taking Active Family Member  glucose blood test strip [id number]  Use as instructed Lucille Passy, MD  Active Family Member  insulin NPH-regular Human (70-30) 100 UNIT/ML injection [id number] Yes Inject 15 Units into the skin 2 (two) times daily with a meal. Gladstone Lighter, MD Taking Active   Insulin Syringe-Needle U-100 (B-D INS SYRINGE 0.5CC/30GX1/2") 30G X 1/2" 0.5 ML MISC [id number] Yes Use to inject insulin twice a day. Lucille Passy, MD Taking Active Family Member  Omega-3 Fatty Acids (FISH OIL PO) [id number] Yes Take 1 capsule by mouth daily. [provider]  Taking Active Family Member  potassium chloride 20 MEQ TBCR [id number] Yes Take 20 mEq by mouth daily. While on lasix Gladstone Lighter, MD Taking Active   tamsulosin [facility]) 0.4 MG CAPS capsule [id number] Yes Take 2 capsules (0.8 mg total) by mouth daily. Gladstone Lighter, MD Taking Active   Vitamin D, Ergocalciferol, (DRISDOL) 50000 units CAPS capsule [id number] Yes TAKE 1 CAPSULE BY MOUTH ONCE A WEEK  Patient taking differently:  Takes on Monday   Lucille Passy, MD Taking Active Family Member         ASSESSMENT: Date Discharged from Hospital: 03/21/18 Date Medication Reconciliation Performed: 03/28/2018  Medications:  New at Discharge: . Benzonatate . Tussionex suspension . Ceftin -completed course . Docusate   Adjustments at Discharge: . Furosemide increased . Insulin 70/30  Patient was recently discharged from hospital and all medications have been reviewed.  Assessment:  Drugs sorted by system:  Cardiovascular: amlodipine, furosemide, gemfibrozil  Pulmonary/Allergy: benzonatate, Tussionex  Gastrointestinal: docusate   Endocrine: Insulin 70/30  Topical: Combigan opthalmic  Genitourinary: finasteride, tamsulosin  Vitamins/Minerals/Supplements: omega 3 fish oil, ergocalcierol  Medication Review Findings:  . Insulin- reviewed CSNP insurance coverage of insulin, which will be no charge until he reaches the coverage gap.  Informed daughter that [place] can help apply for Lilly brand insulin patient assistance if his endocrinologist changes his insulin to a Lilly product at his next appointment.   Encouraged daughter to get scales and record Mr. Springfield' daily weights. Reviewed that she should call his PCP if he has a 3 lb weigh gain in a day or 5 lbs in a week.  Also, discussed a low salt diet, including checking food labels for sodium.  Daughter state that she has not been reading food labels for sodium or sugar.    Medication Assistance Findings:  No medication  assistance needs identified  Plan: Will route note to PCP, Dr. Edwina Barth.  Joetta Manners, PharmD Clinical Pharmacist [place] [phone]

## 2018-03-31 DIAGNOSIS — A419 Sepsis, unspecified organism: Secondary | ICD-10-CM | POA: Diagnosis not present

## 2018-03-31 DIAGNOSIS — E1151 Type 2 diabetes mellitus with diabetic peripheral angiopathy without gangrene: Secondary | ICD-10-CM | POA: Diagnosis not present

## 2018-03-31 DIAGNOSIS — E1122 Type 2 diabetes mellitus with diabetic chronic kidney disease: Secondary | ICD-10-CM | POA: Diagnosis not present

## 2018-03-31 DIAGNOSIS — I872 Venous insufficiency (chronic) (peripheral): Secondary | ICD-10-CM | POA: Diagnosis not present

## 2018-03-31 DIAGNOSIS — I509 Heart failure, unspecified: Secondary | ICD-10-CM | POA: Diagnosis not present

## 2018-03-31 DIAGNOSIS — F17200 Nicotine dependence, unspecified, uncomplicated: Secondary | ICD-10-CM | POA: Diagnosis not present

## 2018-03-31 DIAGNOSIS — N401 Enlarged prostate with lower urinary tract symptoms: Secondary | ICD-10-CM | POA: Diagnosis not present

## 2018-03-31 DIAGNOSIS — Z6841 Body Mass Index (BMI) 40.0 and over, adult: Secondary | ICD-10-CM | POA: Diagnosis not present

## 2018-03-31 DIAGNOSIS — R338 Other retention of urine: Secondary | ICD-10-CM | POA: Diagnosis not present

## 2018-03-31 DIAGNOSIS — H9190 Unspecified hearing loss, unspecified ear: Secondary | ICD-10-CM | POA: Diagnosis not present

## 2018-03-31 DIAGNOSIS — E1129 Type 2 diabetes mellitus with other diabetic kidney complication: Secondary | ICD-10-CM | POA: Diagnosis not present

## 2018-03-31 DIAGNOSIS — Z466 Encounter for fitting and adjustment of urinary device: Secondary | ICD-10-CM | POA: Diagnosis not present

## 2018-03-31 DIAGNOSIS — M810 Age-related osteoporosis without current pathological fracture: Secondary | ICD-10-CM | POA: Diagnosis not present

## 2018-03-31 DIAGNOSIS — L97422 Non-pressure chronic ulcer of left heel and midfoot with fat layer exposed: Secondary | ICD-10-CM | POA: Diagnosis not present

## 2018-03-31 DIAGNOSIS — Z794 Long term (current) use of insulin: Secondary | ICD-10-CM | POA: Diagnosis not present

## 2018-03-31 DIAGNOSIS — L97522 Non-pressure chronic ulcer of other part of left foot with fat layer exposed: Secondary | ICD-10-CM | POA: Diagnosis not present

## 2018-03-31 DIAGNOSIS — E669 Obesity, unspecified: Secondary | ICD-10-CM | POA: Diagnosis not present

## 2018-03-31 DIAGNOSIS — J189 Pneumonia, unspecified organism: Secondary | ICD-10-CM | POA: Diagnosis not present

## 2018-03-31 DIAGNOSIS — I130 Hypertensive heart and chronic kidney disease with heart failure and stage 1 through stage 4 chronic kidney disease, or unspecified chronic kidney disease: Secondary | ICD-10-CM | POA: Diagnosis not present

## 2018-03-31 DIAGNOSIS — E11621 Type 2 diabetes mellitus with foot ulcer: Secondary | ICD-10-CM | POA: Diagnosis not present

## 2018-03-31 DIAGNOSIS — N184 Chronic kidney disease, stage 4 (severe): Secondary | ICD-10-CM | POA: Diagnosis not present

## 2018-04-02 DIAGNOSIS — E1151 Type 2 diabetes mellitus with diabetic peripheral angiopathy without gangrene: Secondary | ICD-10-CM | POA: Diagnosis not present

## 2018-04-02 DIAGNOSIS — H9190 Unspecified hearing loss, unspecified ear: Secondary | ICD-10-CM | POA: Diagnosis not present

## 2018-04-02 DIAGNOSIS — I872 Venous insufficiency (chronic) (peripheral): Secondary | ICD-10-CM | POA: Diagnosis not present

## 2018-04-02 DIAGNOSIS — N184 Chronic kidney disease, stage 4 (severe): Secondary | ICD-10-CM | POA: Diagnosis not present

## 2018-04-02 DIAGNOSIS — J189 Pneumonia, unspecified organism: Secondary | ICD-10-CM | POA: Diagnosis not present

## 2018-04-02 DIAGNOSIS — Z466 Encounter for fitting and adjustment of urinary device: Secondary | ICD-10-CM | POA: Diagnosis not present

## 2018-04-02 DIAGNOSIS — Z794 Long term (current) use of insulin: Secondary | ICD-10-CM | POA: Diagnosis not present

## 2018-04-02 DIAGNOSIS — L97522 Non-pressure chronic ulcer of other part of left foot with fat layer exposed: Secondary | ICD-10-CM | POA: Diagnosis not present

## 2018-04-02 DIAGNOSIS — R338 Other retention of urine: Secondary | ICD-10-CM | POA: Diagnosis not present

## 2018-04-02 DIAGNOSIS — F17200 Nicotine dependence, unspecified, uncomplicated: Secondary | ICD-10-CM | POA: Diagnosis not present

## 2018-04-02 DIAGNOSIS — Z6841 Body Mass Index (BMI) 40.0 and over, adult: Secondary | ICD-10-CM | POA: Diagnosis not present

## 2018-04-02 DIAGNOSIS — E1122 Type 2 diabetes mellitus with diabetic chronic kidney disease: Secondary | ICD-10-CM | POA: Diagnosis not present

## 2018-04-02 DIAGNOSIS — N401 Enlarged prostate with lower urinary tract symptoms: Secondary | ICD-10-CM | POA: Diagnosis not present

## 2018-04-02 DIAGNOSIS — M810 Age-related osteoporosis without current pathological fracture: Secondary | ICD-10-CM | POA: Diagnosis not present

## 2018-04-02 DIAGNOSIS — L97422 Non-pressure chronic ulcer of left heel and midfoot with fat layer exposed: Secondary | ICD-10-CM | POA: Diagnosis not present

## 2018-04-02 DIAGNOSIS — A419 Sepsis, unspecified organism: Secondary | ICD-10-CM | POA: Diagnosis not present

## 2018-04-02 DIAGNOSIS — I509 Heart failure, unspecified: Secondary | ICD-10-CM | POA: Diagnosis not present

## 2018-04-02 DIAGNOSIS — E11621 Type 2 diabetes mellitus with foot ulcer: Secondary | ICD-10-CM | POA: Diagnosis not present

## 2018-04-02 DIAGNOSIS — I130 Hypertensive heart and chronic kidney disease with heart failure and stage 1 through stage 4 chronic kidney disease, or unspecified chronic kidney disease: Secondary | ICD-10-CM | POA: Diagnosis not present

## 2018-04-02 DIAGNOSIS — E1129 Type 2 diabetes mellitus with other diabetic kidney complication: Secondary | ICD-10-CM | POA: Diagnosis not present

## 2018-04-02 DIAGNOSIS — E669 Obesity, unspecified: Secondary | ICD-10-CM | POA: Diagnosis not present

## 2018-04-04 DIAGNOSIS — E538 Deficiency of other specified B group vitamins: Secondary | ICD-10-CM | POA: Diagnosis not present

## 2018-04-04 DIAGNOSIS — R5383 Other fatigue: Secondary | ICD-10-CM | POA: Diagnosis not present

## 2018-04-04 DIAGNOSIS — E1149 Type 2 diabetes mellitus with other diabetic neurological complication: Secondary | ICD-10-CM | POA: Diagnosis not present

## 2018-04-07 DIAGNOSIS — E1151 Type 2 diabetes mellitus with diabetic peripheral angiopathy without gangrene: Secondary | ICD-10-CM | POA: Diagnosis not present

## 2018-04-07 DIAGNOSIS — E11621 Type 2 diabetes mellitus with foot ulcer: Secondary | ICD-10-CM | POA: Diagnosis not present

## 2018-04-07 DIAGNOSIS — R339 Retention of urine, unspecified: Secondary | ICD-10-CM | POA: Diagnosis not present

## 2018-04-07 DIAGNOSIS — I509 Heart failure, unspecified: Secondary | ICD-10-CM | POA: Diagnosis not present

## 2018-04-07 DIAGNOSIS — E1129 Type 2 diabetes mellitus with other diabetic kidney complication: Secondary | ICD-10-CM | POA: Diagnosis not present

## 2018-04-07 DIAGNOSIS — R338 Other retention of urine: Secondary | ICD-10-CM | POA: Diagnosis not present

## 2018-04-07 DIAGNOSIS — Z466 Encounter for fitting and adjustment of urinary device: Secondary | ICD-10-CM | POA: Diagnosis not present

## 2018-04-07 DIAGNOSIS — N183 Chronic kidney disease, stage 3 (moderate): Secondary | ICD-10-CM | POA: Diagnosis not present

## 2018-04-07 DIAGNOSIS — E113293 Type 2 diabetes mellitus with mild nonproliferative diabetic retinopathy without macular edema, bilateral: Secondary | ICD-10-CM | POA: Diagnosis not present

## 2018-04-07 DIAGNOSIS — N184 Chronic kidney disease, stage 4 (severe): Secondary | ICD-10-CM | POA: Diagnosis not present

## 2018-04-07 DIAGNOSIS — L97522 Non-pressure chronic ulcer of other part of left foot with fat layer exposed: Secondary | ICD-10-CM | POA: Diagnosis not present

## 2018-04-07 DIAGNOSIS — I130 Hypertensive heart and chronic kidney disease with heart failure and stage 1 through stage 4 chronic kidney disease, or unspecified chronic kidney disease: Secondary | ICD-10-CM | POA: Diagnosis not present

## 2018-04-07 DIAGNOSIS — L97521 Non-pressure chronic ulcer of other part of left foot limited to breakdown of skin: Secondary | ICD-10-CM | POA: Diagnosis not present

## 2018-04-07 DIAGNOSIS — F17200 Nicotine dependence, unspecified, uncomplicated: Secondary | ICD-10-CM | POA: Diagnosis not present

## 2018-04-07 DIAGNOSIS — L97411 Non-pressure chronic ulcer of right heel and midfoot limited to breakdown of skin: Secondary | ICD-10-CM | POA: Diagnosis not present

## 2018-04-07 DIAGNOSIS — I872 Venous insufficiency (chronic) (peripheral): Secondary | ICD-10-CM | POA: Diagnosis not present

## 2018-04-07 DIAGNOSIS — E1165 Type 2 diabetes mellitus with hyperglycemia: Secondary | ICD-10-CM | POA: Diagnosis not present

## 2018-04-07 DIAGNOSIS — L97422 Non-pressure chronic ulcer of left heel and midfoot with fat layer exposed: Secondary | ICD-10-CM | POA: Diagnosis not present

## 2018-04-07 DIAGNOSIS — J189 Pneumonia, unspecified organism: Secondary | ICD-10-CM | POA: Diagnosis not present

## 2018-04-07 DIAGNOSIS — A419 Sepsis, unspecified organism: Secondary | ICD-10-CM | POA: Diagnosis not present

## 2018-04-07 DIAGNOSIS — E669 Obesity, unspecified: Secondary | ICD-10-CM | POA: Diagnosis not present

## 2018-04-07 DIAGNOSIS — E1142 Type 2 diabetes mellitus with diabetic polyneuropathy: Secondary | ICD-10-CM | POA: Diagnosis not present

## 2018-04-07 DIAGNOSIS — E1169 Type 2 diabetes mellitus with other specified complication: Secondary | ICD-10-CM | POA: Diagnosis not present

## 2018-04-07 DIAGNOSIS — H9190 Unspecified hearing loss, unspecified ear: Secondary | ICD-10-CM | POA: Diagnosis not present

## 2018-04-07 DIAGNOSIS — Z794 Long term (current) use of insulin: Secondary | ICD-10-CM | POA: Diagnosis not present

## 2018-04-07 DIAGNOSIS — N401 Enlarged prostate with lower urinary tract symptoms: Secondary | ICD-10-CM | POA: Diagnosis not present

## 2018-04-07 DIAGNOSIS — Z9289 Personal history of other medical treatment: Secondary | ICD-10-CM | POA: Diagnosis not present

## 2018-04-07 DIAGNOSIS — E1122 Type 2 diabetes mellitus with diabetic chronic kidney disease: Secondary | ICD-10-CM | POA: Diagnosis not present

## 2018-04-07 DIAGNOSIS — Z6838 Body mass index (BMI) 38.0-38.9, adult: Secondary | ICD-10-CM | POA: Diagnosis not present

## 2018-04-07 DIAGNOSIS — M810 Age-related osteoporosis without current pathological fracture: Secondary | ICD-10-CM | POA: Diagnosis not present

## 2018-04-07 DIAGNOSIS — Z6841 Body Mass Index (BMI) 40.0 and over, adult: Secondary | ICD-10-CM | POA: Diagnosis not present

## 2018-04-09 DIAGNOSIS — L97522 Non-pressure chronic ulcer of other part of left foot with fat layer exposed: Secondary | ICD-10-CM | POA: Diagnosis not present

## 2018-04-09 DIAGNOSIS — N401 Enlarged prostate with lower urinary tract symptoms: Secondary | ICD-10-CM | POA: Diagnosis not present

## 2018-04-09 DIAGNOSIS — E1122 Type 2 diabetes mellitus with diabetic chronic kidney disease: Secondary | ICD-10-CM | POA: Diagnosis not present

## 2018-04-09 DIAGNOSIS — E1151 Type 2 diabetes mellitus with diabetic peripheral angiopathy without gangrene: Secondary | ICD-10-CM | POA: Diagnosis not present

## 2018-04-09 DIAGNOSIS — Z6841 Body Mass Index (BMI) 40.0 and over, adult: Secondary | ICD-10-CM | POA: Diagnosis not present

## 2018-04-09 DIAGNOSIS — N184 Chronic kidney disease, stage 4 (severe): Secondary | ICD-10-CM | POA: Diagnosis not present

## 2018-04-09 DIAGNOSIS — E11621 Type 2 diabetes mellitus with foot ulcer: Secondary | ICD-10-CM | POA: Diagnosis not present

## 2018-04-09 DIAGNOSIS — I872 Venous insufficiency (chronic) (peripheral): Secondary | ICD-10-CM | POA: Diagnosis not present

## 2018-04-09 DIAGNOSIS — Z794 Long term (current) use of insulin: Secondary | ICD-10-CM | POA: Diagnosis not present

## 2018-04-09 DIAGNOSIS — I130 Hypertensive heart and chronic kidney disease with heart failure and stage 1 through stage 4 chronic kidney disease, or unspecified chronic kidney disease: Secondary | ICD-10-CM | POA: Diagnosis not present

## 2018-04-09 DIAGNOSIS — J189 Pneumonia, unspecified organism: Secondary | ICD-10-CM | POA: Diagnosis not present

## 2018-04-09 DIAGNOSIS — I509 Heart failure, unspecified: Secondary | ICD-10-CM | POA: Diagnosis not present

## 2018-04-09 DIAGNOSIS — E1129 Type 2 diabetes mellitus with other diabetic kidney complication: Secondary | ICD-10-CM | POA: Diagnosis not present

## 2018-04-09 DIAGNOSIS — M810 Age-related osteoporosis without current pathological fracture: Secondary | ICD-10-CM | POA: Diagnosis not present

## 2018-04-09 DIAGNOSIS — E669 Obesity, unspecified: Secondary | ICD-10-CM | POA: Diagnosis not present

## 2018-04-09 DIAGNOSIS — H9190 Unspecified hearing loss, unspecified ear: Secondary | ICD-10-CM | POA: Diagnosis not present

## 2018-04-09 DIAGNOSIS — A419 Sepsis, unspecified organism: Secondary | ICD-10-CM | POA: Diagnosis not present

## 2018-04-09 DIAGNOSIS — Z466 Encounter for fitting and adjustment of urinary device: Secondary | ICD-10-CM | POA: Diagnosis not present

## 2018-04-09 DIAGNOSIS — F17200 Nicotine dependence, unspecified, uncomplicated: Secondary | ICD-10-CM | POA: Diagnosis not present

## 2018-04-09 DIAGNOSIS — R338 Other retention of urine: Secondary | ICD-10-CM | POA: Diagnosis not present

## 2018-04-09 DIAGNOSIS — L97422 Non-pressure chronic ulcer of left heel and midfoot with fat layer exposed: Secondary | ICD-10-CM | POA: Diagnosis not present

## 2018-04-10 ENCOUNTER — Other Ambulatory Visit: Payer: Self-pay

## 2018-04-10 ENCOUNTER — Encounter: Payer: Self-pay | Admitting: Urology

## 2018-04-10 ENCOUNTER — Other Ambulatory Visit: Payer: Self-pay | Admitting: Radiology

## 2018-04-10 ENCOUNTER — Ambulatory Visit: Payer: HMO | Admitting: Urology

## 2018-04-10 VITALS — BP 127/69 | HR 65

## 2018-04-10 DIAGNOSIS — R339 Retention of urine, unspecified: Secondary | ICD-10-CM | POA: Diagnosis not present

## 2018-04-10 MED ORDER — LIDOCAINE HCL URETHRAL/MUCOSAL 2 % EX GEL
1.0000 "application " | Freq: Once | CUTANEOUS | Status: AC
  Administered 2018-04-10: 1 via URETHRAL

## 2018-04-10 NOTE — Progress Notes (Signed)
   04/10/18  CC:  Chief Complaint  Patient presents with  . Cysto    HPI: Refer to my previous office note of 03/26/2018.  Blood pressure 127/69, pulse 65. NED. A&Ox3.   No respiratory distress    Cystoscopy Procedure Note  Patient identification was confirmed, informed consent was obtained, and patient was prepped using Betadine solution.  Lidocaine jelly was administered per urethral meatus.     Pre-Procedure: - Inspection reveals a phimotic foreskin.  The meatus is visualized.  Procedure: The flexible cystoscope was introduced without difficulty - No urethral strictures/lesions are present. - >4 cm prostatic urethra with touching lateral lobes prostate  - Elevated bladder neck - Bilateral ureteral orifices identified - Bladder mucosa  reveals inflammatory changes secondary to the chronic indwelling Foley.  No solid or papillary lesions identified. - No bladder stones -Moderate trabeculation  Post-Procedure: - Patient tolerated the procedure well  Assessment/ Plan: Significant BPH.  Patient has had significant prostate volumes of >1 L with no sensation of bladder fullness.  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  He does have significant medical comorbidities and it is unlikely an outlet procedure would be beneficial.  I discussed with his family options of continuing a urethral catheter, scheduling suprapubic tube placement and consideration of urodynamic study and if outlet obstruction identified proceeding with an outlet procedure.  His family does not desire surgery due to his overall health.  They would like to keep an indwelling catheter and have elected to schedule placement of a suprapubic tube.   Abbie Sons, MD

## 2018-04-10 NOTE — Progress Notes (Signed)
Cath Change/ Replacement  Patient is present today for a catheter change due to urinary retention.  45ml of water was removed from the balloon, a 16FR Coude foley cath was removed with out difficulty.  Patient was cleaned and prepped in a sterile fashion with betadine and 2% lidocaine jelly was instilled into the urethra. A 16FR Coude foley cath was replaced into the bladder no complications were noted Urine return was noted 147ml and urine was clear yellow in color. The balloon was filled with 60ml of sterile water. A night bag was attached for drainage. Patient was given proper instruction on catheter care.    Preformed by: Gordy Clement, CMA  Follow up: As scheduled   .

## 2018-04-11 ENCOUNTER — Telehealth: Payer: Self-pay | Admitting: Radiology

## 2018-04-11 NOTE — Telephone Encounter (Signed)
Notified pt's daughter, Levada Dy, (listed on DPR) of suprapubic tube placement scheduled 04/18/2018 at 10:00. Advised that patient should have nothing to eat or drink after midnight prior to procedure & have a driver present. Also advised for patient to take amlodipine with a sip of water and other medications can be taken after procedure. Questions answered. Daughter expresses understanding of instructions.

## 2018-04-14 DIAGNOSIS — E1122 Type 2 diabetes mellitus with diabetic chronic kidney disease: Secondary | ICD-10-CM | POA: Diagnosis not present

## 2018-04-14 DIAGNOSIS — L97422 Non-pressure chronic ulcer of left heel and midfoot with fat layer exposed: Secondary | ICD-10-CM | POA: Diagnosis not present

## 2018-04-14 DIAGNOSIS — N401 Enlarged prostate with lower urinary tract symptoms: Secondary | ICD-10-CM | POA: Diagnosis not present

## 2018-04-14 DIAGNOSIS — L97522 Non-pressure chronic ulcer of other part of left foot with fat layer exposed: Secondary | ICD-10-CM | POA: Diagnosis not present

## 2018-04-14 DIAGNOSIS — E11621 Type 2 diabetes mellitus with foot ulcer: Secondary | ICD-10-CM | POA: Diagnosis not present

## 2018-04-14 DIAGNOSIS — M810 Age-related osteoporosis without current pathological fracture: Secondary | ICD-10-CM | POA: Diagnosis not present

## 2018-04-14 DIAGNOSIS — N184 Chronic kidney disease, stage 4 (severe): Secondary | ICD-10-CM | POA: Diagnosis not present

## 2018-04-14 DIAGNOSIS — Z466 Encounter for fitting and adjustment of urinary device: Secondary | ICD-10-CM | POA: Diagnosis not present

## 2018-04-14 DIAGNOSIS — E669 Obesity, unspecified: Secondary | ICD-10-CM | POA: Diagnosis not present

## 2018-04-14 DIAGNOSIS — E1129 Type 2 diabetes mellitus with other diabetic kidney complication: Secondary | ICD-10-CM | POA: Diagnosis not present

## 2018-04-14 DIAGNOSIS — R338 Other retention of urine: Secondary | ICD-10-CM | POA: Diagnosis not present

## 2018-04-14 DIAGNOSIS — Z6841 Body Mass Index (BMI) 40.0 and over, adult: Secondary | ICD-10-CM | POA: Diagnosis not present

## 2018-04-14 DIAGNOSIS — I130 Hypertensive heart and chronic kidney disease with heart failure and stage 1 through stage 4 chronic kidney disease, or unspecified chronic kidney disease: Secondary | ICD-10-CM | POA: Diagnosis not present

## 2018-04-14 DIAGNOSIS — I872 Venous insufficiency (chronic) (peripheral): Secondary | ICD-10-CM | POA: Diagnosis not present

## 2018-04-14 DIAGNOSIS — J189 Pneumonia, unspecified organism: Secondary | ICD-10-CM | POA: Diagnosis not present

## 2018-04-14 DIAGNOSIS — I509 Heart failure, unspecified: Secondary | ICD-10-CM | POA: Diagnosis not present

## 2018-04-14 DIAGNOSIS — E1151 Type 2 diabetes mellitus with diabetic peripheral angiopathy without gangrene: Secondary | ICD-10-CM | POA: Diagnosis not present

## 2018-04-14 DIAGNOSIS — H9190 Unspecified hearing loss, unspecified ear: Secondary | ICD-10-CM | POA: Diagnosis not present

## 2018-04-14 DIAGNOSIS — A419 Sepsis, unspecified organism: Secondary | ICD-10-CM | POA: Diagnosis not present

## 2018-04-14 DIAGNOSIS — Z794 Long term (current) use of insulin: Secondary | ICD-10-CM | POA: Diagnosis not present

## 2018-04-14 DIAGNOSIS — F17200 Nicotine dependence, unspecified, uncomplicated: Secondary | ICD-10-CM | POA: Diagnosis not present

## 2018-04-16 DIAGNOSIS — R338 Other retention of urine: Secondary | ICD-10-CM | POA: Diagnosis not present

## 2018-04-16 DIAGNOSIS — I509 Heart failure, unspecified: Secondary | ICD-10-CM | POA: Diagnosis not present

## 2018-04-16 DIAGNOSIS — E1151 Type 2 diabetes mellitus with diabetic peripheral angiopathy without gangrene: Secondary | ICD-10-CM | POA: Diagnosis not present

## 2018-04-16 DIAGNOSIS — L97422 Non-pressure chronic ulcer of left heel and midfoot with fat layer exposed: Secondary | ICD-10-CM | POA: Diagnosis not present

## 2018-04-16 DIAGNOSIS — N401 Enlarged prostate with lower urinary tract symptoms: Secondary | ICD-10-CM | POA: Diagnosis not present

## 2018-04-16 DIAGNOSIS — E11621 Type 2 diabetes mellitus with foot ulcer: Secondary | ICD-10-CM | POA: Diagnosis not present

## 2018-04-16 DIAGNOSIS — A419 Sepsis, unspecified organism: Secondary | ICD-10-CM | POA: Diagnosis not present

## 2018-04-16 DIAGNOSIS — J189 Pneumonia, unspecified organism: Secondary | ICD-10-CM | POA: Diagnosis not present

## 2018-04-16 DIAGNOSIS — Z466 Encounter for fitting and adjustment of urinary device: Secondary | ICD-10-CM | POA: Diagnosis not present

## 2018-04-16 DIAGNOSIS — Z794 Long term (current) use of insulin: Secondary | ICD-10-CM | POA: Diagnosis not present

## 2018-04-16 DIAGNOSIS — E1129 Type 2 diabetes mellitus with other diabetic kidney complication: Secondary | ICD-10-CM | POA: Diagnosis not present

## 2018-04-16 DIAGNOSIS — H9190 Unspecified hearing loss, unspecified ear: Secondary | ICD-10-CM | POA: Diagnosis not present

## 2018-04-16 DIAGNOSIS — E669 Obesity, unspecified: Secondary | ICD-10-CM | POA: Diagnosis not present

## 2018-04-16 DIAGNOSIS — Z6841 Body Mass Index (BMI) 40.0 and over, adult: Secondary | ICD-10-CM | POA: Diagnosis not present

## 2018-04-16 DIAGNOSIS — I130 Hypertensive heart and chronic kidney disease with heart failure and stage 1 through stage 4 chronic kidney disease, or unspecified chronic kidney disease: Secondary | ICD-10-CM | POA: Diagnosis not present

## 2018-04-16 DIAGNOSIS — I872 Venous insufficiency (chronic) (peripheral): Secondary | ICD-10-CM | POA: Diagnosis not present

## 2018-04-16 DIAGNOSIS — M810 Age-related osteoporosis without current pathological fracture: Secondary | ICD-10-CM | POA: Diagnosis not present

## 2018-04-16 DIAGNOSIS — F17200 Nicotine dependence, unspecified, uncomplicated: Secondary | ICD-10-CM | POA: Diagnosis not present

## 2018-04-16 DIAGNOSIS — N184 Chronic kidney disease, stage 4 (severe): Secondary | ICD-10-CM | POA: Diagnosis not present

## 2018-04-16 DIAGNOSIS — L97522 Non-pressure chronic ulcer of other part of left foot with fat layer exposed: Secondary | ICD-10-CM | POA: Diagnosis not present

## 2018-04-16 DIAGNOSIS — E1122 Type 2 diabetes mellitus with diabetic chronic kidney disease: Secondary | ICD-10-CM | POA: Diagnosis not present

## 2018-04-17 ENCOUNTER — Other Ambulatory Visit: Payer: Self-pay | Admitting: Radiology

## 2018-04-18 ENCOUNTER — Ambulatory Visit
Admission: RE | Admit: 2018-04-18 | Discharge: 2018-04-18 | Disposition: A | Discharge status: Home / Self Care | Payer: HMO | Source: Ambulatory Visit | Attending: Urology | Admitting: Urology

## 2018-04-18 ENCOUNTER — Other Ambulatory Visit: Payer: Self-pay

## 2018-04-18 ENCOUNTER — Encounter: Payer: Self-pay | Admitting: Urology

## 2018-04-18 ENCOUNTER — Ambulatory Visit: Admission: RE | Admit: 2018-04-18 | Payer: HMO | Source: Ambulatory Visit

## 2018-04-18 DIAGNOSIS — E1122 Type 2 diabetes mellitus with diabetic chronic kidney disease: Secondary | ICD-10-CM | POA: Insufficient documentation

## 2018-04-18 DIAGNOSIS — Z79899 Other long term (current) drug therapy: Secondary | ICD-10-CM | POA: Insufficient documentation

## 2018-04-18 DIAGNOSIS — I509 Heart failure, unspecified: Secondary | ICD-10-CM | POA: Insufficient documentation

## 2018-04-18 DIAGNOSIS — Z8249 Family history of ischemic heart disease and other diseases of the circulatory system: Secondary | ICD-10-CM | POA: Insufficient documentation

## 2018-04-18 DIAGNOSIS — Z7982 Long term (current) use of aspirin: Secondary | ICD-10-CM | POA: Diagnosis not present

## 2018-04-18 DIAGNOSIS — N189 Chronic kidney disease, unspecified: Secondary | ICD-10-CM | POA: Insufficient documentation

## 2018-04-18 DIAGNOSIS — Z880 Allergy status to penicillin: Secondary | ICD-10-CM | POA: Diagnosis not present

## 2018-04-18 DIAGNOSIS — Z833 Family history of diabetes mellitus: Secondary | ICD-10-CM | POA: Diagnosis not present

## 2018-04-18 DIAGNOSIS — Z794 Long term (current) use of insulin: Secondary | ICD-10-CM | POA: Insufficient documentation

## 2018-04-18 DIAGNOSIS — R339 Retention of urine, unspecified: Secondary | ICD-10-CM

## 2018-04-18 DIAGNOSIS — I130 Hypertensive heart and chronic kidney disease with heart failure and stage 1 through stage 4 chronic kidney disease, or unspecified chronic kidney disease: Secondary | ICD-10-CM | POA: Diagnosis not present

## 2018-04-18 LAB — CBC
HCT: 43.6 % (ref 39.0–52.0)
Hemoglobin: 14.6 g/dL (ref 13.0–17.0)
MCH: 29.4 pg (ref 26.0–34.0)
MCHC: 33.5 g/dL (ref 30.0–36.0)
MCV: 87.9 fL (ref 80.0–100.0)
Platelets: 218 10*3/uL (ref 150–400)
RBC: 4.96 MIL/uL (ref 4.22–5.81)
RDW: 13.4 % (ref 11.5–15.5)
WBC: 9 10*3/uL (ref 4.0–10.5)
nRBC: 0 % (ref 0.0–0.2)

## 2018-04-18 LAB — GLUCOSE, CAPILLARY
Glucose-Capillary: 115 mg/dL — ABNORMAL HIGH (ref 70–99)
Glucose-Capillary: 152 mg/dL — ABNORMAL HIGH (ref 70–99)

## 2018-04-18 LAB — PROTIME-INR
INR: 0.9 (ref 0.8–1.2)
Prothrombin Time: 12.4 seconds (ref 11.4–15.2)

## 2018-04-18 MED ORDER — MIDAZOLAM HCL 2 MG/2ML IJ SOLN
INTRAMUSCULAR | Status: AC
  Filled 2018-04-18: qty 2

## 2018-04-18 MED ORDER — FENTANYL CITRATE (PF) 100 MCG/2ML IJ SOLN
INTRAMUSCULAR | Status: AC | PRN
  Administered 2018-04-18: 50 ug via INTRAVENOUS

## 2018-04-18 MED ORDER — CIPROFLOXACIN IN D5W 400 MG/200ML IV SOLN
400.0000 mg | INTRAVENOUS | Status: AC
  Administered 2018-04-18: 400 mg via INTRAVENOUS
  Filled 2018-04-18: qty 200

## 2018-04-18 MED ORDER — MIDAZOLAM HCL 2 MG/2ML IJ SOLN
INTRAMUSCULAR | Status: AC | PRN
  Administered 2018-04-18: 1 mg via INTRAVENOUS

## 2018-04-18 MED ORDER — LIDOCAINE HCL (PF) 1 % IJ SOLN
INTRAMUSCULAR | Status: AC | PRN
  Administered 2018-04-18: 10 mL

## 2018-04-18 MED ORDER — CIPROFLOXACIN IN D5W 400 MG/200ML IV SOLN
INTRAVENOUS | Status: AC
  Administered 2018-04-18: 400 mg via INTRAVENOUS
  Filled 2018-04-18: qty 200

## 2018-04-18 MED ORDER — FENTANYL CITRATE (PF) 100 MCG/2ML IJ SOLN
INTRAMUSCULAR | Status: AC
  Filled 2018-04-18: qty 2

## 2018-04-18 NOTE — Progress Notes (Addendum)
Discharged patient to his daughter and HPOA, Levada Dy, at [facility] entrance. Reviewed discharge paperwork and provided education pertaining to today's procedure and provided dressing change materials per d/c orders. Advised patient and family member to contact urology if any additional supplies (I.e. urine collection bag) are needed per protocol. Patient and family member stated no further questions at this time.

## 2018-04-18 NOTE — Discharge Instructions (Signed)
Suprapubic Catheter Replacement  Suprapubic catheter replacement is a procedure to remove a catheter and insert a new, clean catheter. A suprapubic catheter is a rubber tube that drains urine from the bladder into a collection bag outside of the body. The catheter is inserted into the bladder through a small opening in the lower abdomen, near the center of the body, above the pubic bone (suprapubic area). There is a tiny balloon filled with germ-free (sterile) water on the end of the catheter that is in the bladder. The balloonhelps to keep the catheter in place.  If you need to wear a catheter for a long period of time, you may be instructed to replace the catheter yourself. Usually, suprapubic catheters need to be replaced every 4-6 weeks or as often as told by your health care provider.  What are the risks?  Generally, this is a safe procedure. However, problems may occur, including failure to get the catheter into the bladder.  What happens before the procedure?     Follow instructions from your health care provider about eating or drinking restrictions. Drink plenty of fluids starting several hours before your catheter will be replaced.   Ask your health care provider about changing or stopping your regular medicines. This is especially important if you are taking diabetes medicines or blood thinners.   You may be given antibiotic medicine to help prevent infection.   You may have an exam or testing.   You may have a blood or urine sample taken.   Plan to have someone take you home after the procedure.   If you go home right after the procedure, plan to have someone with you for 24 hours.  What happens during the procedure?   To reduce your risk of infection:  ? Your health care team will wash or sanitize their hands, and put on sterile gloves.  ? The skin around your catheter opening will be washed with sterile cleaning solution.   You will lie on your back.   The water from the balloon will be  removed using a syringe.   The catheter will be slowly removed.   Your health care provider will apply lubricant to the new catheter. Lubricant will be applied to the end of the catheter that will go into your bladder.   The new catheter will be inserted through the opening in your abdomen. Your health care provider will slide the catheter into your bladder.   Your health care provider will wait for some urine to start flowing through the catheter. When this happens, a syringe will be used to fill the balloon with sterile water.   A collection bag will be attached to the end of the catheter.  The procedure may vary among health care providers and hospitals.  What happens after the procedure?   Make sure that you understand how to care for your catheter, your collection bag, and the opening in your abdomen.   Make sure that you know what to do in case you have difficulty replacing your suprapubic catheter yourself, if this applies.  This information is not intended to replace advice given to you by your health care provider. Make sure you discuss any questions you have with your health care provider.  Document Released: 10/03/2000 Document Revised: 09/07/2015 Document Reviewed: 09/21/2014  Elsevier Interactive Patient Education  2019 [employer]

## 2018-04-18 NOTE — H&P (Signed)
Chief Complaint: Patient was seen in consultation today for No chief complaint on file.  at the request of Stoioff,Scott C  Referring Physician(s): Stoioff,Scott C  Supervising Physician: Marybelle Killings  Patient Status: [facility] - Out-pt  History of Present Illness: Damon [name] is a 85 y.o. male with a history of urinary retention.  He is not a candidate for operative intervention and suprapubic catheter placement is requested.  He also has a history of arrhythmia and CHF with bradycardia.  Past Medical History:  Diagnosis Date  . Arrhythmia    Baseline bradycardia  . Bradycardia   . CHF (congestive heart failure) ([place])   . Chronic kidney disease    BAseline creatinine 1.4 to 1.5  . Diabetes mellitus   . Hard of hearing   . Hypertension     Past Surgical History:  Procedure Laterality Date  . COLONOSCOPY  01/01/2012   Procedure: COLONOSCOPY;  Surgeon: [name], MD;  Location: [facility] ENDOSCOPY;  Service: Endoscopy;  Laterality: N/A;  . PARATHYROIDECTOMY    . TONSILLECTOMY      Allergies: Penicillins  Medications: Prior to Admission medications   Medication Sig Start Date End Date Taking? Authorizing Provider  amLODipine (NORVASC) 5 MG tablet Take 1 tablet by mouth 2 (two) times daily.   Yes [provider]  aspirin 81 MG tablet Take 81 mg by mouth daily.     Yes [provider]  benzonatate (TESSALON) 200 MG capsule Take 1 capsule (200 mg total) by mouth 3 (three) times daily. 03/21/18  Yes Gladstone Lighter, MD  brimonidine-timolol (COMBIGAN) 0.2-0.5 % ophthalmic solution Place 1 drop into the left eye at bedtime.   Yes [provider]  chlorpheniramine-HYDROcodone (TUSSIONEX) 10-8 MG/5ML SUER Take 5 mLs by mouth every 12 (twelve) hours. 03/21/18  Yes Gladstone Lighter, MD  docusate sodium (COLACE) 100 MG capsule Take 1 capsule (100 mg total) by mouth 2 (two) times daily. 03/21/18  Yes Gladstone Lighter, MD  finasteride (PROSCAR) 5  MG tablet Take 1 tablet (5 mg total) by mouth daily. 03/22/18  Yes Gladstone Lighter, MD  furosemide (LASIX) 40 MG tablet Take 1 tablet (40 mg total) by mouth daily. 03/21/18 06/19/18 Yes Gladstone Lighter, MD  gemfibrozil (LOPID) 600 MG tablet TAKE 1 TABLET BY MOUTH TWICE DAILY BEFORE  MEALS 10/21/17  Yes Lucille Passy, MD  glucose blood test strip Use as instructed 04/04/15  Yes Lucille Passy, MD  insulin NPH-regular Human (70-30) 100 UNIT/ML injection Inject 15 Units into the skin 2 (two) times daily with a meal. 03/21/18 07/19/18 Yes Gladstone Lighter, MD  Insulin Syringe-Needle U-100 (B-D INS SYRINGE 0.5CC/30GX1/2") 30G X 1/2" 0.5 ML MISC Use to inject insulin twice a day. 04/04/15  Yes Lucille Passy, MD  Omega-3 Fatty Acids (FISH OIL PO) Take 1 capsule by mouth daily.   Yes [provider]  potassium chloride 20 MEQ TBCR Take 20 mEq by mouth daily. While on lasix 03/21/18  Yes Gladstone Lighter, MD  tamsulosin (FLOMAX) 0.4 MG CAPS capsule Take 2 capsules (0.8 mg total) by mouth daily. 03/21/18  Yes Gladstone Lighter, MD  Vitamin D, Ergocalciferol, (DRISDOL) 50000 units CAPS capsule TAKE 1 CAPSULE BY MOUTH ONCE A WEEK Patient taking differently: Takes on Monday 09/09/17  Yes Lucille Passy, MD     Family History  Problem Relation Age of Onset  . Diabetes type II Mother   . Hypertension Mother   . Diabetes type II Father   .  Hypertension Father     Social History   Socioeconomic History  . Marital status: Widowed    Spouse name: Not on file  . Number of children: Not on file  . Years of education: Not on file  . Highest education level: Not on file  Occupational History  . Not on file  Social Needs  . Financial resource strain: Not on file  . Food insecurity:    Worry: Not on file    Inability: Not on file  . Transportation needs:    Medical: Not on file    Non-medical: Not on file  Tobacco Use  . Smoking status: Never Smoker  . Smokeless tobacco: Former Systems developer    Types:  Chew  . Tobacco comment: chewing more than 40 years/chew 1PPD  Substance and Sexual Activity  . Alcohol use: No    Alcohol/week: 0.0 standard drinks  . Drug use: No  . Sexual activity: Not Currently  Lifestyle  . Physical activity:    Days per week: Not on file    Minutes per session: Not on file  . Stress: Not on file  Relationships  . Social connections:    Talks on phone: Not on file    Gets together: Not on file    Attends religious service: Not on file    Active member of club or organization: Not on file    Attends meetings of clubs or organizations: Not on file    Relationship status: Not on file  Other Topics Concern  . Not on file  Social History Narrative   Would desire CPR     Review of Systems: A 12 point ROS discussed and pertinent positives are indicated in the HPI above.  All other systems are negative.  Review of Systems  Vital Signs: BP (!) 142/55   Pulse (!) 52   Temp 98 F (36.7 C) (Oral)   Resp 18   Ht 5\' 5"  (1.651 m)   Wt 99 kg   SpO2 97%   BMI 36.32 kg/m   Physical Exam Constitutional:      Appearance: Normal appearance.  HENT:     Head: Normocephalic and atraumatic.  Cardiovascular:     Rate and Rhythm: Normal rate and regular rhythm.  Pulmonary:     Effort: Pulmonary effort is normal.     Breath sounds: Normal breath sounds.  Skin:    General: Skin is warm and dry.  Neurological:     General: No focal deficit present.     Mental Status: He is alert and oriented to person, place, and time.     Imaging: No results found.  Labs:  CBC: Recent Labs    03/06/18 0522 03/18/18 0625 03/19/18 0424 04/18/18 0838  WBC 7.6 12.5* 7.6 9.0  HGB 15.7 16.0 13.7 14.6  HCT 46.3 49.5 41.5 43.6  PLT 172 253 183 218    COAGS: Recent Labs    03/03/18 1434 04/18/18 0838  INR 1.02 0.9  APTT 40*  --     BMP: Recent Labs    03/14/18 1635 03/18/18 0625 03/19/18 0424 03/21/18 0442  NA 140 147* 138 137  K 4.1 3.9 3.1* 3.2*  CL  108 111 104 98  CO2 17* 25 24 26   GLUCOSE 258* 99 324* 218*  BUN 33* 36* 30* 27*  CALCIUM 9.1 9.0 8.1* 8.1*  CREATININE 2.26* 2.33* 1.95* 1.84*  GFRNONAA 26* 25* 31* 33*  GFRAA 30* 29* 36* 39*    LIVER FUNCTION  TESTS: Recent Labs    03/03/18 1434 03/14/18 1635 03/18/18 0625  BILITOT 1.4* 0.7 0.8  AST 20 16 23   ALT 20 21 18   ALKPHOS 83 69 73  PROT 8.3* 6.8 7.5  ALBUMIN 4.2 3.3* 3.5    TUMOR MARKERS: No results for input(s): AFPTM, CEA, CA199, CHROMGRNA in the last 8760 hours.  Assessment and Plan:  Urinary retention.  Suprapubic bladder catheter is requested and is to follow with CT guidance.  Consent was obtained from the patient's daughter.  Her questions were answered.  Risks were explained.  Thank you for this interesting consult.  I greatly enjoyed meeting Damon [name] and look forward to participating in their care.  A copy of this report was sent to the requesting provider on this date.  Electronically Signed: Art A Mayson Mcneish, MD 04/18/2018, 9:16 AM   I spent a total of  40 Minutes   in face to face in clinical consultation, greater than 50% of which was counseling/coordinating care for CT-guided suprapubic bladder catheter placement.

## 2018-04-18 NOTE — Procedures (Signed)
12 Fr suprapubic catheter placement EBL 0 Comp 0

## 2018-04-20 ENCOUNTER — Emergency Department
Admission: EM | Admit: 2018-04-20 | Discharge: 2018-04-20 | Disposition: A | Discharge status: Home / Self Care | Payer: HMO | Attending: Emergency Medicine | Admitting: Emergency Medicine

## 2018-04-20 ENCOUNTER — Other Ambulatory Visit: Payer: Self-pay

## 2018-04-20 ENCOUNTER — Telehealth: Payer: Self-pay | Admitting: Urology

## 2018-04-20 ENCOUNTER — Encounter: Payer: Self-pay | Admitting: Emergency Medicine

## 2018-04-20 DIAGNOSIS — Z79899 Other long term (current) drug therapy: Secondary | ICD-10-CM | POA: Insufficient documentation

## 2018-04-20 DIAGNOSIS — R319 Hematuria, unspecified: Secondary | ICD-10-CM | POA: Insufficient documentation

## 2018-04-20 DIAGNOSIS — E119 Type 2 diabetes mellitus without complications: Secondary | ICD-10-CM | POA: Insufficient documentation

## 2018-04-20 DIAGNOSIS — F17220 Nicotine dependence, chewing tobacco, uncomplicated: Secondary | ICD-10-CM | POA: Diagnosis not present

## 2018-04-20 DIAGNOSIS — I5032 Chronic diastolic (congestive) heart failure: Secondary | ICD-10-CM | POA: Insufficient documentation

## 2018-04-20 DIAGNOSIS — Z794 Long term (current) use of insulin: Secondary | ICD-10-CM | POA: Insufficient documentation

## 2018-04-20 DIAGNOSIS — Z7982 Long term (current) use of aspirin: Secondary | ICD-10-CM | POA: Diagnosis not present

## 2018-04-20 DIAGNOSIS — I110 Hypertensive heart disease with heart failure: Secondary | ICD-10-CM | POA: Diagnosis not present

## 2018-04-20 LAB — URINALYSIS, COMPLETE (UACMP) WITH MICROSCOPIC
Bacteria, UA: NONE SEEN
RBC / HPF: >50 RBC/hpf — ABNORMAL HIGH (ref 0–5)
Specific Gravity, Urine: 1.009 (ref 1.005–1.030)
Squamous Epithelial / LPF: NONE SEEN (ref 0–5)
WBC, UA: >50 WBC/hpf — ABNORMAL HIGH (ref 0–5)

## 2018-04-20 LAB — BASIC METABOLIC PANEL
Anion gap: 14 (ref 5–15)
BUN: 29 mg/dL — ABNORMAL HIGH (ref 8–23)
CO2: 24 mmol/L (ref 22–32)
Calcium: 8.8 mg/dL — ABNORMAL LOW (ref 8.9–10.3)
Chloride: 95 mmol/L — ABNORMAL LOW (ref 98–111)
Creatinine, Ser: 1.55 mg/dL — ABNORMAL HIGH (ref 0.61–1.24)
GFR calc Af Amer: 48 mL/min — ABNORMAL LOW (ref >60)
GFR calc non Af Amer: 41 mL/min — ABNORMAL LOW (ref >60)
Glucose, Bld: 228 mg/dL — ABNORMAL HIGH (ref 70–99)
Potassium: 3.9 mmol/L (ref 3.5–5.1)
Sodium: 133 mmol/L — ABNORMAL LOW (ref 135–145)

## 2018-04-20 LAB — CBC
HCT: 44.4 % (ref 39.0–52.0)
Hemoglobin: 15 g/dL (ref 13.0–17.0)
MCH: 29.2 pg (ref 26.0–34.0)
MCHC: 33.8 g/dL (ref 30.0–36.0)
MCV: 86.5 fL (ref 80.0–100.0)
Platelets: 205 10*3/uL (ref 150–400)
RBC: 5.13 MIL/uL (ref 4.22–5.81)
RDW: 13.2 % (ref 11.5–15.5)
WBC: 7.5 10*3/uL (ref 4.0–10.5)
nRBC: 0 % (ref 0.0–0.2)

## 2018-04-20 LAB — PROTIME-INR
INR: 1 (ref 0.8–1.2)
Prothrombin Time: 13.4 seconds (ref 11.4–15.2)

## 2018-04-20 LAB — APTT: aPTT: 40 seconds — ABNORMAL HIGH (ref 24–36)

## 2018-04-20 NOTE — ED Notes (Signed)
Pt verbalized understanding of discharge instructions. NAD at this time. RN to go over discharge instructions with daughter upon meeting in lobby.

## 2018-04-20 NOTE — Telephone Encounter (Signed)
Urology telephone note  I was contacted by the after-hours on-call service regarding patient Damon [name].  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 Hoss) on 04/18/2018.  The patient's daughter is reporting bloody urine in the tubing.  The patient denies any pain or bladder pressure.  The catheter is reportedly draining well.  He is not on any blood thinners.  The patient's family does not want to take him to the emergency room in the setting of COVID-19 risk.  I recommended to push fluids and monitor closely.  If the catheter is not draining, the patient has fever over 101, or has suprapubic pain I recommended presenting to the ER for further evaluation.  Nickolas Madrid, MD 04/20/2018

## 2018-04-20 NOTE — ED Triage Notes (Addendum)
Pt presents to ED via POV with c/o hematuria from his suprapubic catheter. Pt denies pain at this time. Pt with obvious bleeding noted into catheter bag. Pt states bleeding started earlier today.

## 2018-04-20 NOTE — Discharge Instructions (Signed)
Urology and interventional radiology was consulted and they agree that the bleeding is likely normal and will resolve on its own, please follow-up with urology if no improvement in 48 hours.  Your tests here were reassuring

## 2018-04-20 NOTE — ED Provider Notes (Signed)
[facility] Emergency Department Provider Note   ____________________________________________    I have reviewed the triage vital signs and the nursing notes.   HISTORY  Chief Complaint Hematuria     HPI Damon [name] is a 85 y.o. male who presents with complaints of hematuria.  Patient has a suprapubic catheter, he is not entirely sure why he has one.  He reports no pain.  Reports today his urine became bloody.  Called his "doctor "who told him to drink plenty of fluids and it would get better and since it did not he came to the emergency department.  He states he is on a blood thinner but he does not know what blood thinner it is.  He does not know why he is on a blood thinner.  Past Medical History:  Diagnosis Date  . Arrhythmia    Baseline bradycardia  . Bradycardia   . CHF (congestive heart failure) ([place])   . Chronic kidney disease    BAseline creatinine 1.4 to 1.5  . Diabetes mellitus   . Hard of hearing   . Hypertension     Patient Active Problem List   Diagnosis Date Noted  . Bilateral leg edema 03/18/2018  . Sepsis ([place]) 03/03/2018  . 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 Visit for well man health check 08/07/2016  . Mood swings 10/11/2015  . Chronic renal disease 09/11/2013  . Hypoglycemia 09/11/2013  . Diastolic CHF ([place]) 38/25/0539  . Hyperlipidemia 09/09/2013  . Urge incontinence 11/19/2012  . Incomplete emptying of bladder 11/19/2012  . Enlarged prostate with lower urinary tract symptoms (LUTS) 11/19/2012  . Benign localized hyperplasia of prostate with urinary obstruction and other lower urinary tract symptoms (LUTS)(600.21) 11/19/2012  . 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 DIABETIC PERIPHERAL NEUROPATHY 12/04/2005  . OBESITY 12/04/2005  . Essential hypertension 12/04/2005    Past Surgical History:  Procedure Laterality Date  . COLONOSCOPY  01/01/2012   Procedure: COLONOSCOPY;  Surgeon:  [name], MD;  Location: [facility] ENDOSCOPY;  Service: Endoscopy;  Laterality: N/A;  . PARATHYROIDECTOMY    . TONSILLECTOMY      Prior to Admission medications   Medication Sig Start Date End Date Taking? Authorizing Provider  amLODipine (NORVASC) 5 MG tablet Take 1 tablet by mouth 2 (two) times daily.    [provider]  aspirin 81 MG tablet Take 81 mg by mouth daily.      [provider]  benzonatate (TESSALON) 200 MG capsule Take 1 capsule (200 mg total) by mouth 3 (three) times daily. 03/21/18   Gladstone Lighter, MD  brimonidine-timolol (COMBIGAN) 0.2-0.5 % ophthalmic solution Place 1 drop into the left eye at bedtime.    [provider]  chlorpheniramine-HYDROcodone (TUSSIONEX) 10-8 MG/5ML SUER Take 5 mLs by mouth every 12 (twelve) hours. 03/21/18   Gladstone Lighter, MD  docusate sodium (COLACE) 100 MG capsule Take 1 capsule (100 mg total) by mouth 2 (two) times daily. 03/21/18   Gladstone Lighter, MD  finasteride (PROSCAR) 5 MG tablet Take 1 tablet (5 mg total) by mouth daily. 03/22/18   Gladstone Lighter, MD  furosemide (LASIX) 40 MG tablet Take 1 tablet (40 mg total) by mouth daily. 03/21/18 06/19/18  Gladstone Lighter, MD  gemfibrozil (LOPID) 600 MG tablet TAKE 1 TABLET BY MOUTH TWICE DAILY BEFORE  MEALS 10/21/17   Lucille Passy, MD  glucose blood test strip Use as instructed 04/04/15   Lucille Passy, MD  insulin NPH-regular Human (70-30) 100 UNIT/ML injection Inject 15 Units into the skin 2 (two) times daily with a meal. 03/21/18 07/19/18  Gladstone Lighter, MD  Insulin Syringe-Needle U-100 (B-D INS SYRINGE 0.5CC/30GX1/2") 30G X 1/2" 0.5 ML MISC Use to inject insulin twice a day. 04/04/15   Lucille Passy, MD  latanoprost (XALATAN) 0.005 % ophthalmic solution Place 1 drop into both eyes at bedtime. 04/08/18   [provider]  Omega-3 Fatty Acids (FISH OIL PO) Take 1 capsule by mouth daily.    [provider]  potassium chloride 20 MEQ TBCR  Take 20 mEq by mouth daily. While on lasix 03/21/18   Gladstone Lighter, MD  tamsulosin (FLOMAX) 0.4 MG CAPS capsule Take 2 capsules (0.8 mg total) by mouth daily. 03/21/18   Gladstone Lighter, MD  Vitamin D, Ergocalciferol, (DRISDOL) 50000 units CAPS capsule TAKE 1 CAPSULE BY MOUTH ONCE A WEEK Patient taking differently: Takes on Monday 09/09/17   Lucille Passy, MD     Allergies Penicillins  Family History  Problem Relation Age of Onset  . Diabetes type II Mother   . Hypertension Mother   . Diabetes type II Father   . Hypertension Father     Social History Social History   Tobacco Use  . Smoking status: Never Smoker  . Smokeless tobacco: Former Systems developer    Types: Chew  . Tobacco comment: chewing more than 40 years/chew 1PPD  Substance Use Topics  . Alcohol use: No    Alcohol/week: 0.0 standard drinks  . Drug use: No    Review of Systems  Constitutional: No fever/chills Eyes: No visual changes.  ENT: No neck pain Cardiovascular: No palpitations Respiratory: Denies shortness of breath. Gastrointestinal: No abdominal pain.  No nausea, no vomiting.   Genitourinary: As above Musculoskeletal: Negative for back pain. Skin: Negative for rash. Neurological: Negative for headaches    ____________________________________________   PHYSICAL EXAM:  VITAL SIGNS: ED Triage Vitals  Enc Vitals Group     BP 04/20/18 1420 (!) 134/57     Pulse Rate 04/20/18 1420 (!) 58     Resp 04/20/18 1420 18     Temp 04/20/18 1420 97.7 F (36.5 C)     Temp Source 04/20/18 1420 Oral     SpO2 04/20/18 1420 97 %     Weight 04/20/18 1420 99.8 kg (220 lb)     Height 04/20/18 1425 1.651 m (5\' 5" )     Head Circumference --      Peak Flow --      Pain Score 04/20/18 1425 0     Pain Loc --      Pain Edu? --      Excl. in [place]? --     Constitutional: Alert and oriented.  Eyes: Conjunctivae are normal.   Nose: No congestion/rhinnorhea.  Cardiovascular: Good peripheral circulation.  Respiratory: Normal respiratory effort.  No retractions. Gastrointestinal: Soft and nontender. No distention.  No CVA tenderness.  Genitourinary: Suprapubic in good position Musculoskeletal:  Warm and well perfused Neurologic:  Normal speech and language. No gross focal neurologic deficits are appreciated.  Skin:  Skin is warm, dry and intact. No rash noted. Psychiatric: Mood and affect are normal. Speech and behavior are normal.  ____________________________________________   LABS (all labs ordered are listed, but only abnormal results are displayed)  Labs Reviewed  URINALYSIS, COMPLETE (UACMP) WITH MICROSCOPIC - Abnormal; Notable for the following components:      Result Value   Color, Urine AMBER (*)  APPearance CLOUDY (*)    Glucose, UA   (*)    Value: TEST NOT REPORTED DUE TO COLOR INTERFERENCE OF URINE PIGMENT   Hgb urine dipstick   (*)    Value: TEST NOT REPORTED DUE TO COLOR INTERFERENCE OF URINE PIGMENT   Bilirubin Urine   (*)    Value: TEST NOT REPORTED DUE TO COLOR INTERFERENCE OF URINE PIGMENT   Ketones, ur   (*)    Value: TEST NOT REPORTED DUE TO COLOR INTERFERENCE OF URINE PIGMENT   Protein, ur   (*)    Value: TEST NOT REPORTED DUE TO COLOR INTERFERENCE OF URINE PIGMENT   Nitrite   (*)    Value: TEST NOT REPORTED DUE TO COLOR INTERFERENCE OF URINE PIGMENT   Leukocytes,Ua   (*)    Value: TEST NOT REPORTED DUE TO COLOR INTERFERENCE OF URINE PIGMENT   RBC / HPF >50 (*)    WBC, UA >50 (*)    All other components within normal limits  BASIC METABOLIC PANEL - Abnormal; Notable for the following components:   Sodium 133 (*)    Chloride 95 (*)    Glucose, Bld 228 (*)    BUN 29 (*)    Creatinine, Ser 1.55 (*)    Calcium 8.8 (*)    GFR calc non Af Amer 41 (*)    GFR calc Af Amer 48 (*)    All other components within normal limits  APTT - Abnormal; Notable for the following components:   aPTT 40 (*)    All other components within normal limits  CBC  PROTIME-INR    ____________________________________________  EKG  None ____________________________________________  RADIOLOGY  None ____________________________________________   PROCEDURES  Procedure(s) performed: No  Procedures   Critical Care performed: No ____________________________________________   INITIAL IMPRESSION / ASSESSMENT AND PLAN / ED COURSE  Pertinent labs & imaging results that were available during my care of the patient were reviewed by me and considered in my medical decision making (see chart for details).  Patient presents with hematuria, he has no abdominal pain.  Recent suprapubic catheter placement.  Lab work is reassuring, hemoglobin is unremarkable.  Coags are normal.  Discussed with Dr. Caprice Beaver of urology who feels no intervention is necessary, discussed with interventional radiology and they agree as long as catheter is draining appropriately patient can follow-up as an outpatient.    ____________________________________________   FINAL CLINICAL IMPRESSION(S) / ED DIAGNOSES  Final diagnoses:  Hematuria, unspecified type        Note:  This document was prepared using Dragon voice recognition software and may include unintentional dictation errors.   Lavonia Drafts, MD 04/20/18 [phone]

## 2018-04-20 NOTE — ED Notes (Signed)
Urinary catheter flushed with 10cc of NS without difficulty with return.

## 2018-04-21 ENCOUNTER — Telehealth: Payer: Self-pay | Admitting: Radiology

## 2018-04-21 ENCOUNTER — Other Ambulatory Visit: Payer: Self-pay | Admitting: Radiology

## 2018-04-21 DIAGNOSIS — I130 Hypertensive heart and chronic kidney disease with heart failure and stage 1 through stage 4 chronic kidney disease, or unspecified chronic kidney disease: Secondary | ICD-10-CM | POA: Diagnosis not present

## 2018-04-21 DIAGNOSIS — E1151 Type 2 diabetes mellitus with diabetic peripheral angiopathy without gangrene: Secondary | ICD-10-CM | POA: Diagnosis not present

## 2018-04-21 DIAGNOSIS — R339 Retention of urine, unspecified: Secondary | ICD-10-CM

## 2018-04-21 DIAGNOSIS — Z794 Long term (current) use of insulin: Secondary | ICD-10-CM | POA: Diagnosis not present

## 2018-04-21 DIAGNOSIS — F17200 Nicotine dependence, unspecified, uncomplicated: Secondary | ICD-10-CM | POA: Diagnosis not present

## 2018-04-21 DIAGNOSIS — I872 Venous insufficiency (chronic) (peripheral): Secondary | ICD-10-CM | POA: Diagnosis not present

## 2018-04-21 DIAGNOSIS — R338 Other retention of urine: Secondary | ICD-10-CM | POA: Diagnosis not present

## 2018-04-21 DIAGNOSIS — Z466 Encounter for fitting and adjustment of urinary device: Secondary | ICD-10-CM | POA: Diagnosis not present

## 2018-04-21 DIAGNOSIS — M810 Age-related osteoporosis without current pathological fracture: Secondary | ICD-10-CM | POA: Diagnosis not present

## 2018-04-21 DIAGNOSIS — Z6841 Body Mass Index (BMI) 40.0 and over, adult: Secondary | ICD-10-CM | POA: Diagnosis not present

## 2018-04-21 DIAGNOSIS — E1122 Type 2 diabetes mellitus with diabetic chronic kidney disease: Secondary | ICD-10-CM | POA: Diagnosis not present

## 2018-04-21 DIAGNOSIS — N401 Enlarged prostate with lower urinary tract symptoms: Secondary | ICD-10-CM | POA: Diagnosis not present

## 2018-04-21 DIAGNOSIS — E1129 Type 2 diabetes mellitus with other diabetic kidney complication: Secondary | ICD-10-CM | POA: Diagnosis not present

## 2018-04-21 DIAGNOSIS — L97422 Non-pressure chronic ulcer of left heel and midfoot with fat layer exposed: Secondary | ICD-10-CM | POA: Diagnosis not present

## 2018-04-21 DIAGNOSIS — N184 Chronic kidney disease, stage 4 (severe): Secondary | ICD-10-CM | POA: Diagnosis not present

## 2018-04-21 DIAGNOSIS — H9190 Unspecified hearing loss, unspecified ear: Secondary | ICD-10-CM | POA: Diagnosis not present

## 2018-04-21 DIAGNOSIS — J189 Pneumonia, unspecified organism: Secondary | ICD-10-CM | POA: Diagnosis not present

## 2018-04-21 DIAGNOSIS — E669 Obesity, unspecified: Secondary | ICD-10-CM | POA: Diagnosis not present

## 2018-04-21 DIAGNOSIS — A419 Sepsis, unspecified organism: Secondary | ICD-10-CM | POA: Diagnosis not present

## 2018-04-21 DIAGNOSIS — L97522 Non-pressure chronic ulcer of other part of left foot with fat layer exposed: Secondary | ICD-10-CM | POA: Diagnosis not present

## 2018-04-21 DIAGNOSIS — I509 Heart failure, unspecified: Secondary | ICD-10-CM | POA: Diagnosis not present

## 2018-04-21 DIAGNOSIS — E11621 Type 2 diabetes mellitus with foot ulcer: Secondary | ICD-10-CM | POA: Diagnosis not present

## 2018-04-21 NOTE — Telephone Encounter (Signed)
Advised daughter of Dr Doristine Counter note below and that per IR nurse she may pick up an adapter and overnight bag for SPT as patient's output is greater than the leg bag can hold overnight. Notified her of patient's appointment for SPT exchange in IR on 05/16/2018.  Questions answered. Daughter expresses understanding of conversation.

## 2018-04-21 NOTE — Telephone Encounter (Signed)
Spoke with daughter, Damon [name], regarding ED admission on 04/20/2018 for hematuria. Daughter reports decreased hematuria today. Denies fever but hasn't used a thermometer. Recommended checking with a thermometer at least twice daily & return to ED for fever greater than 101 or if SPT stops draining. Daughter expresses understanding of instructions.

## 2018-04-21 NOTE — Telephone Encounter (Signed)
Agree. He is a Armed forces operational officer patient, I would just keep prior follow up regarding SP upsizing plan, no need to be seen in clinic. Agree with plan for ED if fever over 101 or non-draining SPT.  Damon Madrid, MD 04/21/2018

## 2018-04-23 DIAGNOSIS — Z6841 Body Mass Index (BMI) 40.0 and over, adult: Secondary | ICD-10-CM | POA: Diagnosis not present

## 2018-04-23 DIAGNOSIS — E669 Obesity, unspecified: Secondary | ICD-10-CM | POA: Diagnosis not present

## 2018-04-23 DIAGNOSIS — A419 Sepsis, unspecified organism: Secondary | ICD-10-CM | POA: Diagnosis not present

## 2018-04-23 DIAGNOSIS — I872 Venous insufficiency (chronic) (peripheral): Secondary | ICD-10-CM | POA: Diagnosis not present

## 2018-04-23 DIAGNOSIS — L97522 Non-pressure chronic ulcer of other part of left foot with fat layer exposed: Secondary | ICD-10-CM | POA: Diagnosis not present

## 2018-04-23 DIAGNOSIS — E1122 Type 2 diabetes mellitus with diabetic chronic kidney disease: Secondary | ICD-10-CM | POA: Diagnosis not present

## 2018-04-23 DIAGNOSIS — I509 Heart failure, unspecified: Secondary | ICD-10-CM | POA: Diagnosis not present

## 2018-04-23 DIAGNOSIS — Z466 Encounter for fitting and adjustment of urinary device: Secondary | ICD-10-CM | POA: Diagnosis not present

## 2018-04-23 DIAGNOSIS — E1129 Type 2 diabetes mellitus with other diabetic kidney complication: Secondary | ICD-10-CM | POA: Diagnosis not present

## 2018-04-23 DIAGNOSIS — N184 Chronic kidney disease, stage 4 (severe): Secondary | ICD-10-CM | POA: Diagnosis not present

## 2018-04-23 DIAGNOSIS — R338 Other retention of urine: Secondary | ICD-10-CM | POA: Diagnosis not present

## 2018-04-23 DIAGNOSIS — M810 Age-related osteoporosis without current pathological fracture: Secondary | ICD-10-CM | POA: Diagnosis not present

## 2018-04-23 DIAGNOSIS — F17200 Nicotine dependence, unspecified, uncomplicated: Secondary | ICD-10-CM | POA: Diagnosis not present

## 2018-04-23 DIAGNOSIS — I130 Hypertensive heart and chronic kidney disease with heart failure and stage 1 through stage 4 chronic kidney disease, or unspecified chronic kidney disease: Secondary | ICD-10-CM | POA: Diagnosis not present

## 2018-04-23 DIAGNOSIS — Z794 Long term (current) use of insulin: Secondary | ICD-10-CM | POA: Diagnosis not present

## 2018-04-23 DIAGNOSIS — H9190 Unspecified hearing loss, unspecified ear: Secondary | ICD-10-CM | POA: Diagnosis not present

## 2018-04-23 DIAGNOSIS — E11621 Type 2 diabetes mellitus with foot ulcer: Secondary | ICD-10-CM | POA: Diagnosis not present

## 2018-04-23 DIAGNOSIS — J189 Pneumonia, unspecified organism: Secondary | ICD-10-CM | POA: Diagnosis not present

## 2018-04-23 DIAGNOSIS — E1151 Type 2 diabetes mellitus with diabetic peripheral angiopathy without gangrene: Secondary | ICD-10-CM | POA: Diagnosis not present

## 2018-04-23 DIAGNOSIS — L97422 Non-pressure chronic ulcer of left heel and midfoot with fat layer exposed: Secondary | ICD-10-CM | POA: Diagnosis not present

## 2018-04-23 DIAGNOSIS — N401 Enlarged prostate with lower urinary tract symptoms: Secondary | ICD-10-CM | POA: Diagnosis not present

## 2018-04-24 DIAGNOSIS — I872 Venous insufficiency (chronic) (peripheral): Secondary | ICD-10-CM | POA: Diagnosis not present

## 2018-04-24 DIAGNOSIS — F17200 Nicotine dependence, unspecified, uncomplicated: Secondary | ICD-10-CM | POA: Diagnosis not present

## 2018-04-24 DIAGNOSIS — H9190 Unspecified hearing loss, unspecified ear: Secondary | ICD-10-CM | POA: Diagnosis not present

## 2018-04-24 DIAGNOSIS — E1122 Type 2 diabetes mellitus with diabetic chronic kidney disease: Secondary | ICD-10-CM | POA: Diagnosis not present

## 2018-04-24 DIAGNOSIS — E669 Obesity, unspecified: Secondary | ICD-10-CM | POA: Diagnosis not present

## 2018-04-24 DIAGNOSIS — L97522 Non-pressure chronic ulcer of other part of left foot with fat layer exposed: Secondary | ICD-10-CM | POA: Diagnosis not present

## 2018-04-24 DIAGNOSIS — E1151 Type 2 diabetes mellitus with diabetic peripheral angiopathy without gangrene: Secondary | ICD-10-CM | POA: Diagnosis not present

## 2018-04-24 DIAGNOSIS — J189 Pneumonia, unspecified organism: Secondary | ICD-10-CM | POA: Diagnosis not present

## 2018-04-24 DIAGNOSIS — N184 Chronic kidney disease, stage 4 (severe): Secondary | ICD-10-CM | POA: Diagnosis not present

## 2018-04-24 DIAGNOSIS — R338 Other retention of urine: Secondary | ICD-10-CM | POA: Diagnosis not present

## 2018-04-24 DIAGNOSIS — M810 Age-related osteoporosis without current pathological fracture: Secondary | ICD-10-CM | POA: Diagnosis not present

## 2018-04-24 DIAGNOSIS — A419 Sepsis, unspecified organism: Secondary | ICD-10-CM | POA: Diagnosis not present

## 2018-04-24 DIAGNOSIS — I509 Heart failure, unspecified: Secondary | ICD-10-CM | POA: Diagnosis not present

## 2018-04-24 DIAGNOSIS — L97422 Non-pressure chronic ulcer of left heel and midfoot with fat layer exposed: Secondary | ICD-10-CM | POA: Diagnosis not present

## 2018-04-24 DIAGNOSIS — E11621 Type 2 diabetes mellitus with foot ulcer: Secondary | ICD-10-CM | POA: Diagnosis not present

## 2018-04-24 DIAGNOSIS — Z466 Encounter for fitting and adjustment of urinary device: Secondary | ICD-10-CM | POA: Diagnosis not present

## 2018-04-24 DIAGNOSIS — Z6841 Body Mass Index (BMI) 40.0 and over, adult: Secondary | ICD-10-CM | POA: Diagnosis not present

## 2018-04-24 DIAGNOSIS — E1129 Type 2 diabetes mellitus with other diabetic kidney complication: Secondary | ICD-10-CM | POA: Diagnosis not present

## 2018-04-24 DIAGNOSIS — N401 Enlarged prostate with lower urinary tract symptoms: Secondary | ICD-10-CM | POA: Diagnosis not present

## 2018-04-24 DIAGNOSIS — Z794 Long term (current) use of insulin: Secondary | ICD-10-CM | POA: Diagnosis not present

## 2018-04-24 DIAGNOSIS — I130 Hypertensive heart and chronic kidney disease with heart failure and stage 1 through stage 4 chronic kidney disease, or unspecified chronic kidney disease: Secondary | ICD-10-CM | POA: Diagnosis not present

## 2018-04-28 DIAGNOSIS — Z6841 Body Mass Index (BMI) 40.0 and over, adult: Secondary | ICD-10-CM | POA: Diagnosis not present

## 2018-04-28 DIAGNOSIS — L97522 Non-pressure chronic ulcer of other part of left foot with fat layer exposed: Secondary | ICD-10-CM | POA: Diagnosis not present

## 2018-04-28 DIAGNOSIS — J189 Pneumonia, unspecified organism: Secondary | ICD-10-CM | POA: Diagnosis not present

## 2018-04-28 DIAGNOSIS — R338 Other retention of urine: Secondary | ICD-10-CM | POA: Diagnosis not present

## 2018-04-28 DIAGNOSIS — I872 Venous insufficiency (chronic) (peripheral): Secondary | ICD-10-CM | POA: Diagnosis not present

## 2018-04-28 DIAGNOSIS — F17200 Nicotine dependence, unspecified, uncomplicated: Secondary | ICD-10-CM | POA: Diagnosis not present

## 2018-04-28 DIAGNOSIS — A419 Sepsis, unspecified organism: Secondary | ICD-10-CM | POA: Diagnosis not present

## 2018-04-28 DIAGNOSIS — Z466 Encounter for fitting and adjustment of urinary device: Secondary | ICD-10-CM | POA: Diagnosis not present

## 2018-04-28 DIAGNOSIS — N184 Chronic kidney disease, stage 4 (severe): Secondary | ICD-10-CM | POA: Diagnosis not present

## 2018-04-28 DIAGNOSIS — N401 Enlarged prostate with lower urinary tract symptoms: Secondary | ICD-10-CM | POA: Diagnosis not present

## 2018-04-28 DIAGNOSIS — I130 Hypertensive heart and chronic kidney disease with heart failure and stage 1 through stage 4 chronic kidney disease, or unspecified chronic kidney disease: Secondary | ICD-10-CM | POA: Diagnosis not present

## 2018-04-28 DIAGNOSIS — M810 Age-related osteoporosis without current pathological fracture: Secondary | ICD-10-CM | POA: Diagnosis not present

## 2018-04-28 DIAGNOSIS — H9190 Unspecified hearing loss, unspecified ear: Secondary | ICD-10-CM | POA: Diagnosis not present

## 2018-04-28 DIAGNOSIS — E11621 Type 2 diabetes mellitus with foot ulcer: Secondary | ICD-10-CM | POA: Diagnosis not present

## 2018-04-28 DIAGNOSIS — Z794 Long term (current) use of insulin: Secondary | ICD-10-CM | POA: Diagnosis not present

## 2018-04-28 DIAGNOSIS — E1122 Type 2 diabetes mellitus with diabetic chronic kidney disease: Secondary | ICD-10-CM | POA: Diagnosis not present

## 2018-04-28 DIAGNOSIS — E669 Obesity, unspecified: Secondary | ICD-10-CM | POA: Diagnosis not present

## 2018-04-28 DIAGNOSIS — L97422 Non-pressure chronic ulcer of left heel and midfoot with fat layer exposed: Secondary | ICD-10-CM | POA: Diagnosis not present

## 2018-04-28 DIAGNOSIS — E1151 Type 2 diabetes mellitus with diabetic peripheral angiopathy without gangrene: Secondary | ICD-10-CM | POA: Diagnosis not present

## 2018-04-28 DIAGNOSIS — E1129 Type 2 diabetes mellitus with other diabetic kidney complication: Secondary | ICD-10-CM | POA: Diagnosis not present

## 2018-04-28 DIAGNOSIS — I509 Heart failure, unspecified: Secondary | ICD-10-CM | POA: Diagnosis not present

## 2018-05-05 DIAGNOSIS — L97411 Non-pressure chronic ulcer of right heel and midfoot limited to breakdown of skin: Secondary | ICD-10-CM | POA: Diagnosis not present

## 2018-05-09 DIAGNOSIS — E113293 Type 2 diabetes mellitus with mild nonproliferative diabetic retinopathy without macular edema, bilateral: Secondary | ICD-10-CM | POA: Diagnosis not present

## 2018-05-09 DIAGNOSIS — E781 Pure hyperglyceridemia: Secondary | ICD-10-CM | POA: Diagnosis not present

## 2018-05-09 DIAGNOSIS — I10 Essential (primary) hypertension: Secondary | ICD-10-CM | POA: Diagnosis not present

## 2018-05-09 DIAGNOSIS — E1142 Type 2 diabetes mellitus with diabetic polyneuropathy: Secondary | ICD-10-CM | POA: Diagnosis not present

## 2018-05-09 DIAGNOSIS — E1129 Type 2 diabetes mellitus with other diabetic kidney complication: Secondary | ICD-10-CM | POA: Diagnosis not present

## 2018-05-09 DIAGNOSIS — N183 Chronic kidney disease, stage 3 (moderate): Secondary | ICD-10-CM | POA: Diagnosis not present

## 2018-05-09 DIAGNOSIS — N138 Other obstructive and reflux uropathy: Secondary | ICD-10-CM | POA: Diagnosis not present

## 2018-05-09 DIAGNOSIS — Z794 Long term (current) use of insulin: Secondary | ICD-10-CM | POA: Diagnosis not present

## 2018-05-09 DIAGNOSIS — I5030 Unspecified diastolic (congestive) heart failure: Secondary | ICD-10-CM | POA: Diagnosis not present

## 2018-05-09 DIAGNOSIS — N401 Enlarged prostate with lower urinary tract symptoms: Secondary | ICD-10-CM | POA: Diagnosis not present

## 2018-05-16 ENCOUNTER — Ambulatory Visit
Admission: RE | Admit: 2018-05-16 | Discharge: 2018-05-16 | Disposition: A | Discharge status: Home / Self Care | Payer: HMO | Source: Ambulatory Visit | Attending: Urology | Admitting: Urology

## 2018-05-16 ENCOUNTER — Other Ambulatory Visit: Payer: Self-pay

## 2018-05-16 DIAGNOSIS — R339 Retention of urine, unspecified: Secondary | ICD-10-CM

## 2018-05-16 DIAGNOSIS — Z4803 Encounter for change or removal of drains: Secondary | ICD-10-CM | POA: Insufficient documentation

## 2018-05-16 DIAGNOSIS — T83198A Other mechanical complication of other urinary devices and implants, initial encounter: Secondary | ICD-10-CM | POA: Diagnosis not present

## 2018-05-16 HISTORY — PX: IR CATHETER TUBE CHANGE: IMG717

## 2018-05-16 LAB — GLUCOSE, CAPILLARY: Glucose-Capillary: 108 mg/dL — ABNORMAL HIGH (ref 70–99)

## 2018-05-16 MED ORDER — FENTANYL CITRATE (PF) 100 MCG/2ML IJ SOLN
INTRAMUSCULAR | Status: AC | PRN
  Administered 2018-05-16: 50 ug via INTRAVENOUS

## 2018-05-16 MED ORDER — MIDAZOLAM HCL 2 MG/2ML IJ SOLN
INTRAMUSCULAR | Status: AC
  Filled 2018-05-16: qty 2

## 2018-05-16 MED ORDER — LIDOCAINE HCL (PF) 1 % IJ SOLN
INTRAMUSCULAR | Status: AC
  Filled 2018-05-16: qty 30

## 2018-05-16 MED ORDER — FENTANYL CITRATE (PF) 100 MCG/2ML IJ SOLN
INTRAMUSCULAR | Status: AC
  Filled 2018-05-16: qty 2

## 2018-05-16 MED ORDER — SODIUM CHLORIDE 0.9 % IV SOLN
INTRAVENOUS | Status: DC
  Administered 2018-05-16: 08:00:00 via INTRAVENOUS

## 2018-05-16 MED ORDER — IOHEXOL 300 MG/ML  SOLN
30.0000 mL | Freq: Once | INTRAMUSCULAR | Status: AC | PRN
  Administered 2018-05-16: 10 mL

## 2018-05-16 MED ORDER — MIDAZOLAM HCL 2 MG/2ML IJ SOLN
INTRAMUSCULAR | Status: AC | PRN
  Administered 2018-05-16: 1 mg via INTRAVENOUS

## 2018-05-16 NOTE — Procedures (Signed)
Upsize of SPT to 13F without difficulty  Complications:  None  Blood Loss: none  See dictation in canopy pacs

## 2018-05-19 DIAGNOSIS — R600 Localized edema: Secondary | ICD-10-CM | POA: Diagnosis not present

## 2018-05-19 DIAGNOSIS — I129 Hypertensive chronic kidney disease with stage 1 through stage 4 chronic kidney disease, or unspecified chronic kidney disease: Secondary | ICD-10-CM | POA: Diagnosis not present

## 2018-05-19 DIAGNOSIS — N183 Chronic kidney disease, stage 3 (moderate): Secondary | ICD-10-CM | POA: Diagnosis not present

## 2018-05-19 DIAGNOSIS — E1122 Type 2 diabetes mellitus with diabetic chronic kidney disease: Secondary | ICD-10-CM | POA: Diagnosis not present

## 2018-05-20 DIAGNOSIS — H9190 Unspecified hearing loss, unspecified ear: Secondary | ICD-10-CM | POA: Diagnosis not present

## 2018-05-20 DIAGNOSIS — E1129 Type 2 diabetes mellitus with other diabetic kidney complication: Secondary | ICD-10-CM | POA: Diagnosis not present

## 2018-05-20 DIAGNOSIS — N184 Chronic kidney disease, stage 4 (severe): Secondary | ICD-10-CM | POA: Diagnosis not present

## 2018-05-20 DIAGNOSIS — J189 Pneumonia, unspecified organism: Secondary | ICD-10-CM | POA: Diagnosis not present

## 2018-05-20 DIAGNOSIS — L97522 Non-pressure chronic ulcer of other part of left foot with fat layer exposed: Secondary | ICD-10-CM | POA: Diagnosis not present

## 2018-05-20 DIAGNOSIS — F17200 Nicotine dependence, unspecified, uncomplicated: Secondary | ICD-10-CM | POA: Diagnosis not present

## 2018-05-20 DIAGNOSIS — E1122 Type 2 diabetes mellitus with diabetic chronic kidney disease: Secondary | ICD-10-CM | POA: Diagnosis not present

## 2018-05-20 DIAGNOSIS — I509 Heart failure, unspecified: Secondary | ICD-10-CM | POA: Diagnosis not present

## 2018-05-20 DIAGNOSIS — E669 Obesity, unspecified: Secondary | ICD-10-CM | POA: Diagnosis not present

## 2018-05-20 DIAGNOSIS — N401 Enlarged prostate with lower urinary tract symptoms: Secondary | ICD-10-CM | POA: Diagnosis not present

## 2018-05-20 DIAGNOSIS — M810 Age-related osteoporosis without current pathological fracture: Secondary | ICD-10-CM | POA: Diagnosis not present

## 2018-05-20 DIAGNOSIS — R338 Other retention of urine: Secondary | ICD-10-CM | POA: Diagnosis not present

## 2018-05-20 DIAGNOSIS — I872 Venous insufficiency (chronic) (peripheral): Secondary | ICD-10-CM | POA: Diagnosis not present

## 2018-05-20 DIAGNOSIS — L97422 Non-pressure chronic ulcer of left heel and midfoot with fat layer exposed: Secondary | ICD-10-CM | POA: Diagnosis not present

## 2018-05-20 DIAGNOSIS — Z794 Long term (current) use of insulin: Secondary | ICD-10-CM | POA: Diagnosis not present

## 2018-05-20 DIAGNOSIS — Z6841 Body Mass Index (BMI) 40.0 and over, adult: Secondary | ICD-10-CM | POA: Diagnosis not present

## 2018-05-20 DIAGNOSIS — A419 Sepsis, unspecified organism: Secondary | ICD-10-CM | POA: Diagnosis not present

## 2018-05-20 DIAGNOSIS — E1151 Type 2 diabetes mellitus with diabetic peripheral angiopathy without gangrene: Secondary | ICD-10-CM | POA: Diagnosis not present

## 2018-05-20 DIAGNOSIS — I130 Hypertensive heart and chronic kidney disease with heart failure and stage 1 through stage 4 chronic kidney disease, or unspecified chronic kidney disease: Secondary | ICD-10-CM | POA: Diagnosis not present

## 2018-05-20 DIAGNOSIS — E11621 Type 2 diabetes mellitus with foot ulcer: Secondary | ICD-10-CM | POA: Diagnosis not present

## 2018-05-20 DIAGNOSIS — Z466 Encounter for fitting and adjustment of urinary device: Secondary | ICD-10-CM | POA: Diagnosis not present

## 2018-05-26 DIAGNOSIS — Z6841 Body Mass Index (BMI) 40.0 and over, adult: Secondary | ICD-10-CM | POA: Diagnosis not present

## 2018-05-26 DIAGNOSIS — Z466 Encounter for fitting and adjustment of urinary device: Secondary | ICD-10-CM | POA: Diagnosis not present

## 2018-05-26 DIAGNOSIS — E1151 Type 2 diabetes mellitus with diabetic peripheral angiopathy without gangrene: Secondary | ICD-10-CM | POA: Diagnosis not present

## 2018-05-26 DIAGNOSIS — I509 Heart failure, unspecified: Secondary | ICD-10-CM | POA: Diagnosis not present

## 2018-05-26 DIAGNOSIS — R338 Other retention of urine: Secondary | ICD-10-CM | POA: Diagnosis not present

## 2018-05-26 DIAGNOSIS — I872 Venous insufficiency (chronic) (peripheral): Secondary | ICD-10-CM | POA: Diagnosis not present

## 2018-05-26 DIAGNOSIS — Z794 Long term (current) use of insulin: Secondary | ICD-10-CM | POA: Diagnosis not present

## 2018-05-26 DIAGNOSIS — N184 Chronic kidney disease, stage 4 (severe): Secondary | ICD-10-CM | POA: Diagnosis not present

## 2018-05-26 DIAGNOSIS — A419 Sepsis, unspecified organism: Secondary | ICD-10-CM | POA: Diagnosis not present

## 2018-05-26 DIAGNOSIS — I130 Hypertensive heart and chronic kidney disease with heart failure and stage 1 through stage 4 chronic kidney disease, or unspecified chronic kidney disease: Secondary | ICD-10-CM | POA: Diagnosis not present

## 2018-05-26 DIAGNOSIS — L97422 Non-pressure chronic ulcer of left heel and midfoot with fat layer exposed: Secondary | ICD-10-CM | POA: Diagnosis not present

## 2018-05-26 DIAGNOSIS — Z8701 Personal history of pneumonia (recurrent): Secondary | ICD-10-CM | POA: Diagnosis not present

## 2018-05-26 DIAGNOSIS — H9190 Unspecified hearing loss, unspecified ear: Secondary | ICD-10-CM | POA: Diagnosis not present

## 2018-05-26 DIAGNOSIS — M810 Age-related osteoporosis without current pathological fracture: Secondary | ICD-10-CM | POA: Diagnosis not present

## 2018-05-26 DIAGNOSIS — F17200 Nicotine dependence, unspecified, uncomplicated: Secondary | ICD-10-CM | POA: Diagnosis not present

## 2018-05-26 DIAGNOSIS — E1122 Type 2 diabetes mellitus with diabetic chronic kidney disease: Secondary | ICD-10-CM | POA: Diagnosis not present

## 2018-05-26 DIAGNOSIS — E669 Obesity, unspecified: Secondary | ICD-10-CM | POA: Diagnosis not present

## 2018-05-26 DIAGNOSIS — N401 Enlarged prostate with lower urinary tract symptoms: Secondary | ICD-10-CM | POA: Diagnosis not present

## 2018-05-26 DIAGNOSIS — Z4800 Encounter for change or removal of nonsurgical wound dressing: Secondary | ICD-10-CM | POA: Diagnosis not present

## 2018-05-26 DIAGNOSIS — J189 Pneumonia, unspecified organism: Secondary | ICD-10-CM | POA: Diagnosis not present

## 2018-05-27 ENCOUNTER — Other Ambulatory Visit: Payer: Self-pay

## 2018-05-27 NOTE — Patient Outreach (Signed)
[facility]) Care Management Chronic Special Needs Program  05/27/2018  Name: Damon [name] DOB: 1933-08-29  MRN: [id number]  Mr. Damon [name] is enrolled in a chronic special needs plan for Diabetes. Chronic Care Management Coordinator telephoned client to review health risk assessment and to develop individualized care plan.  Introduced the chronic care management program, importance of client participation, and taking their care plan to all provider appointments and inpatient facilities.    RNCM spoke with client who confirmed identity, but referred the call to daughter, Damon [name]. She reports client is hard of hearing and referral all calls regarding his health to her.  Subjective: [name] reports client moved in with her about 4 months ago. She states she takes client to all his appointment and manages his care. She reports client has a history of falls and had a recent fall this past week. She denies any injury. She also reports client is active with home health [facility] for nursing and physical therapy. She reports she was in the process of obtaining a hospital bed for client, but states it was never delivered and she is not sure why.  States he is in more need of more assistance with diabetes management versus heart failure. [name] states when she tries to talk with client about foods to eat, he will say he will eat what he wants. Per chart, Heart failure currently managed. She states she checks client's weight and records his weight every day. Covid-19 precautions discussed. [name] denies any questions regarding Covid-19 recommendations.  Goals Addressed            This Visit's Progress   . Client understands the importance of follow-up with providers by attending scheduled visits      . Client verbalize knowledge of Heart Failure disease self management skills within the next 6 months.      . Client will report no fall or injuries in the next  within the next 6 months. (pt-stated)      . Client will report no worsening of symptoms related to heart disease within the next 6 months       . Client will use Assistive Devices as needed and verbalize understanding of device use      . Client will verbalize knowledge of self management of Hypertension as evidences by BP reading of 140/90 or less; or as defined by provider      . HEMOGLOBIN A1C < 7.0       Glucose monitoring per provider recommendations Perform Quality checks on blood meter Eat Healthy Check feet daily Visit provider every 3-6 months as directed Hbg A1C level every 3-6 months. Eye Exam yearly    . Maintain timely refills of diabetic medication as prescribed within the year .      Marland Kitchen Obtain annual  Lipid Profile, LDL-C      . Obtain Annual Eye (retinal)  Exam       . Obtain Annual Foot Exam      . Obtain annual screen for micro albuminuria (urine) , nephropathy (kidney problems)      . Obtain Hemoglobin A1C at least 2 times per year      . Visit Primary Care Provider or Endocrinologist at least 2 times per year          Client encouraged to call RNCM as needed. Also reviewed 24 hour nurse advice contact number and encouraged client to call health care concierge for any benefit questions.  Plan:  Send successful outreach letter with a copy of their individualized care plan, Send individual care plan to provider and Send educational material, follow up on hospital bed.  Chronic care management coordination will outreach in within one month.   Will refer client to: pharmacy for medication review-greater than 8 medications.    [name], RN, MSN, [place] [phone]

## 2018-05-28 ENCOUNTER — Other Ambulatory Visit: Payer: Self-pay

## 2018-05-28 NOTE — Patient Outreach (Signed)
  [facility]) Care Management Chronic Special Needs Program    05/28/2018  Name: Damon [name], DOB: [birth year]  MRN: [id number]   Mr. Jaeger Trueheart is enrolled in a chronic special needs plan for Diabetes.  Care Coordination: RNCM received return call from primary care provider office. Spoke with Caryl Pina who will send information regarding hospital bed to [place].  Plan: continue to follow.  [name], RN, MSN, [place] [phone]

## 2018-05-28 NOTE — Patient Outreach (Signed)
  [facility] [facility]) Care Management Chronic Special Needs Program    05/28/2018  Name: Damon [name], DOB: 1933/04/06  MRN: [id number]   Mr. Raiford Fetterman is enrolled in a chronic special needs plan for Diabetes.  Care coordination-spoke with [facility](advanced home care) regarding status of hospital bed. Per Rise Paganini, narrative not signed by doctor, therefore unable to supply bed and case was closed.   RNCM called and spoke with Alan Mulder from [place] [facility] home who reports she was not aware that client had not received bed and referred RNCM to provider for another referral request.   RNCM follow up with provider office-informed that client has not received hospital bed, request referral be sent to advanced home care([facility]) if recommended by primary care.  Plan: continue to follow.  [name], RN, MSN, [place] [phone]

## 2018-05-29 DIAGNOSIS — E1142 Type 2 diabetes mellitus with diabetic polyneuropathy: Secondary | ICD-10-CM | POA: Diagnosis not present

## 2018-05-29 DIAGNOSIS — E1122 Type 2 diabetes mellitus with diabetic chronic kidney disease: Secondary | ICD-10-CM | POA: Diagnosis not present

## 2018-05-29 DIAGNOSIS — Z794 Long term (current) use of insulin: Secondary | ICD-10-CM | POA: Diagnosis not present

## 2018-05-29 DIAGNOSIS — N183 Chronic kidney disease, stage 3 (moderate): Secondary | ICD-10-CM | POA: Diagnosis not present

## 2018-05-29 DIAGNOSIS — R946 Abnormal results of thyroid function studies: Secondary | ICD-10-CM | POA: Diagnosis not present

## 2018-05-29 DIAGNOSIS — I10 Essential (primary) hypertension: Secondary | ICD-10-CM | POA: Diagnosis not present

## 2018-05-29 DIAGNOSIS — E1165 Type 2 diabetes mellitus with hyperglycemia: Secondary | ICD-10-CM | POA: Diagnosis not present

## 2018-06-02 DIAGNOSIS — Z4800 Encounter for change or removal of nonsurgical wound dressing: Secondary | ICD-10-CM | POA: Diagnosis not present

## 2018-06-02 DIAGNOSIS — I130 Hypertensive heart and chronic kidney disease with heart failure and stage 1 through stage 4 chronic kidney disease, or unspecified chronic kidney disease: Secondary | ICD-10-CM | POA: Diagnosis not present

## 2018-06-02 DIAGNOSIS — I509 Heart failure, unspecified: Secondary | ICD-10-CM | POA: Diagnosis not present

## 2018-06-02 DIAGNOSIS — A419 Sepsis, unspecified organism: Secondary | ICD-10-CM | POA: Diagnosis not present

## 2018-06-02 DIAGNOSIS — I872 Venous insufficiency (chronic) (peripheral): Secondary | ICD-10-CM | POA: Diagnosis not present

## 2018-06-02 DIAGNOSIS — L97422 Non-pressure chronic ulcer of left heel and midfoot with fat layer exposed: Secondary | ICD-10-CM | POA: Diagnosis not present

## 2018-06-02 DIAGNOSIS — J189 Pneumonia, unspecified organism: Secondary | ICD-10-CM | POA: Diagnosis not present

## 2018-06-02 DIAGNOSIS — E1151 Type 2 diabetes mellitus with diabetic peripheral angiopathy without gangrene: Secondary | ICD-10-CM | POA: Diagnosis not present

## 2018-06-04 DIAGNOSIS — E1149 Type 2 diabetes mellitus with other diabetic neurological complication: Secondary | ICD-10-CM | POA: Diagnosis not present

## 2018-06-04 DIAGNOSIS — R600 Localized edema: Secondary | ICD-10-CM | POA: Diagnosis not present

## 2018-06-04 DIAGNOSIS — N189 Chronic kidney disease, unspecified: Secondary | ICD-10-CM | POA: Diagnosis not present

## 2018-06-05 ENCOUNTER — Encounter: Payer: Self-pay | Admitting: Urology

## 2018-06-06 ENCOUNTER — Ambulatory Visit: Payer: Self-pay

## 2018-06-08 ENCOUNTER — Emergency Department: Payer: HMO

## 2018-06-08 ENCOUNTER — Other Ambulatory Visit: Payer: Self-pay

## 2018-06-08 ENCOUNTER — Emergency Department
Admission: EM | Admit: 2018-06-08 | Discharge: 2018-06-08 | Disposition: A | Discharge status: Home / Self Care | Payer: HMO | Attending: Student in an Organized Health Care Education/Training Program | Admitting: Student in an Organized Health Care Education/Training Program

## 2018-06-08 DIAGNOSIS — Z794 Long term (current) use of insulin: Secondary | ICD-10-CM | POA: Diagnosis not present

## 2018-06-08 DIAGNOSIS — N189 Chronic kidney disease, unspecified: Secondary | ICD-10-CM | POA: Diagnosis not present

## 2018-06-08 DIAGNOSIS — Y733 Surgical instruments, materials and gastroenterology and urology devices (including sutures) associated with adverse incidents: Secondary | ICD-10-CM | POA: Insufficient documentation

## 2018-06-08 DIAGNOSIS — Z79899 Other long term (current) drug therapy: Secondary | ICD-10-CM | POA: Diagnosis not present

## 2018-06-08 DIAGNOSIS — Z8051 Family history of malignant neoplasm of kidney: Secondary | ICD-10-CM | POA: Diagnosis not present

## 2018-06-08 DIAGNOSIS — I130 Hypertensive heart and chronic kidney disease with heart failure and stage 1 through stage 4 chronic kidney disease, or unspecified chronic kidney disease: Secondary | ICD-10-CM | POA: Diagnosis not present

## 2018-06-08 DIAGNOSIS — Z7982 Long term (current) use of aspirin: Secondary | ICD-10-CM | POA: Diagnosis not present

## 2018-06-08 DIAGNOSIS — I509 Heart failure, unspecified: Secondary | ICD-10-CM | POA: Insufficient documentation

## 2018-06-08 DIAGNOSIS — E1140 Type 2 diabetes mellitus with diabetic neuropathy, unspecified: Secondary | ICD-10-CM | POA: Diagnosis not present

## 2018-06-08 DIAGNOSIS — N329 Bladder disorder, unspecified: Secondary | ICD-10-CM | POA: Diagnosis not present

## 2018-06-08 DIAGNOSIS — T83010A Breakdown (mechanical) of cystostomy catheter, initial encounter: Secondary | ICD-10-CM | POA: Diagnosis not present

## 2018-06-08 DIAGNOSIS — T83028A Displacement of other indwelling urethral catheter, initial encounter: Secondary | ICD-10-CM | POA: Diagnosis not present

## 2018-06-08 DIAGNOSIS — R339 Retention of urine, unspecified: Secondary | ICD-10-CM | POA: Diagnosis present

## 2018-06-08 LAB — URINALYSIS, COMPLETE (UACMP) WITH MICROSCOPIC
Bilirubin Urine: NEGATIVE
Glucose, UA: 50 mg/dL — AB
Ketones, ur: NEGATIVE mg/dL
Nitrite: NEGATIVE
Protein, ur: 100 mg/dL — AB
RBC / HPF: >50 RBC/hpf — ABNORMAL HIGH (ref 0–5)
Specific Gravity, Urine: 1.008 (ref 1.005–1.030)
Squamous Epithelial / HPF: NONE SEEN (ref 0–5)
WBC, UA: >50 WBC/hpf — ABNORMAL HIGH (ref 0–5)
pH: 6 (ref 5.0–8.0)

## 2018-06-08 LAB — CBC WITH DIFFERENTIAL/PLATELET
Abs Immature Granulocytes: 0.06 10*3/uL (ref 0.00–0.07)
Basophils Absolute: 0.1 10*3/uL (ref 0.0–0.1)
Basophils Relative: 1 %
Eosinophils Absolute: 0.3 10*3/uL (ref 0.0–0.5)
Eosinophils Relative: 3 %
HCT: 46.7 % (ref 39.0–52.0)
Hemoglobin: 15.6 g/dL (ref 13.0–17.0)
Immature Granulocytes: 1 %
Lymphocytes Relative: 28 %
Lymphs Abs: 2.5 10*3/uL (ref 0.7–4.0)
MCH: 28.7 pg (ref 26.0–34.0)
MCHC: 33.4 g/dL (ref 30.0–36.0)
MCV: 86 fL (ref 80.0–100.0)
Monocytes Absolute: 0.7 10*3/uL (ref 0.1–1.0)
Monocytes Relative: 8 %
Neutro Abs: 5.5 10*3/uL (ref 1.7–7.7)
Neutrophils Relative %: 59 %
Platelets: 213 10*3/uL (ref 150–400)
RBC: 5.43 MIL/uL (ref 4.22–5.81)
RDW: 13.9 % (ref 11.5–15.5)
WBC: 9.1 10*3/uL (ref 4.0–10.5)
nRBC: 0 % (ref 0.0–0.2)

## 2018-06-08 LAB — BASIC METABOLIC PANEL
Anion gap: 12 (ref 5–15)
BUN: 31 mg/dL — ABNORMAL HIGH (ref 8–23)
CO2: 27 mmol/L (ref 22–32)
Calcium: 9 mg/dL (ref 8.9–10.3)
Chloride: 97 mmol/L — ABNORMAL LOW (ref 98–111)
Creatinine, Ser: 1.63 mg/dL — ABNORMAL HIGH (ref 0.61–1.24)
GFR calc Af Amer: 44 mL/min — ABNORMAL LOW (ref >60)
GFR calc non Af Amer: 38 mL/min — ABNORMAL LOW (ref >60)
Glucose, Bld: 170 mg/dL — ABNORMAL HIGH (ref 70–99)
Potassium: 4.2 mmol/L (ref 3.5–5.1)
Sodium: 136 mmol/L (ref 135–145)

## 2018-06-08 MED ORDER — CEFUROXIME AXETIL 500 MG PO TABS: 500.0000 mg | ORAL_TABLET | Freq: Two times a day (BID) | ORAL | 0 refills | Status: AC

## 2018-06-08 NOTE — ED Notes (Signed)
Pt straight stuck for blood by this RN. Catheter flushed by Dr. Quentin Cornwall with no trouble and good output.

## 2018-06-08 NOTE — ED Provider Notes (Signed)
[facility] Emergency Department Provider Note    First MD Initiated Contact with Patient 06/08/18 1010     (approximate)  I have reviewed the triage vital signs and the nursing notes.   HISTORY  Chief Complaint Urinary Retention    HPI Damon [name] is a 85 y.o. male extensive past medical history as listed below presents with urine draining from around his suprapubic catheter that was recently inserted by IR.  Denies any fever.  Denies any nausea vomiting or pain.    Past Medical History:  Diagnosis Date  . Arrhythmia    Baseline bradycardia  . Bradycardia   . CHF (congestive heart failure) ([place])   . Chronic kidney disease    [place] creatinine 1.4 to 1.5  . Diabetes mellitus   . Hard of hearing   . Hypertension    Family History  Problem Relation Age of Onset  . Diabetes type II Mother   . Hypertension Mother   . Diabetes type II Father   . Hypertension Father    Past Surgical History:  Procedure Laterality Date  . COLONOSCOPY  01/01/2012   Procedure: COLONOSCOPY;  Surgeon: [name], MD;  Location: [facility] ENDOSCOPY;  Service: Endoscopy;  Laterality: N/A;  . IR CATHETER TUBE CHANGE  05/16/2018  . PARATHYROIDECTOMY    . TONSILLECTOMY     Patient Active Problem List   Diagnosis Date Noted  . Bilateral leg edema 03/18/2018  . Sepsis ([place]) 03/03/2018  . 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 Visit for well man health check 08/07/2016  . Mood swings 10/11/2015  . Chronic renal disease 09/11/2013  . Hypoglycemia 09/11/2013  . Diastolic CHF ([place]) 88/50/2774  . Hyperlipidemia 09/09/2013  . Urge incontinence 11/19/2012  . Incomplete emptying of bladder 11/19/2012  . Enlarged prostate with lower urinary tract symptoms (LUTS) 11/19/2012  . Benign localized hyperplasia of prostate with urinary obstruction and other lower urinary tract symptoms (LUTS)(600.21) 11/19/2012  . 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 DIABETIC PERIPHERAL NEUROPATHY 12/04/2005  . OBESITY 12/04/2005  . Essential hypertension 12/04/2005      Prior to Admission medications   Medication Sig Start Date End Date Taking? Authorizing Provider  amLODipine (NORVASC) 5 MG tablet Take 1 tablet by mouth 2 (two) times daily.    [provider]  aspirin 81 MG tablet Take 81 mg by mouth daily.      [provider]  benzonatate (TESSALON) 200 MG capsule Take 1 capsule (200 mg total) by mouth 3 (three) times daily. Patient not taking: Reported on 05/27/2018 03/21/18   Gladstone Lighter, MD  brimonidine-timolol (COMBIGAN) 0.2-0.5 % ophthalmic solution Place 1 drop into the left eye at bedtime.    [provider]  cefUROXime (CEFTIN) 500 MG tablet Take 1 tablet (500 mg total) by mouth 2 (two) times daily for 7 days. 06/08/18 06/15/18  Merlyn Lot, MD  chlorpheniramine-HYDROcodone (TUSSIONEX) 10-8 MG/5ML SUER Take 5 mLs by mouth every 12 (twelve) hours. Patient not taking: Reported on 05/16/2018 03/21/18   Gladstone Lighter, MD  docusate sodium (COLACE) 100 MG capsule Take 1 capsule (100 mg total) by mouth 2 (two) times daily. Patient not taking: Reported on 05/27/2018 03/21/18   Gladstone Lighter, MD  finasteride (PROSCAR) 5 MG tablet Take 1 tablet (5 mg total) by mouth daily. 03/22/18   Gladstone Lighter, MD  furosemide (LASIX) 40 MG tablet Take 1 tablet (40 mg total) by mouth daily. 03/21/18 06/19/18  Kalisetti,  Hart Rochester, MD  gemfibrozil (LOPID) 600 MG tablet TAKE 1 TABLET BY MOUTH TWICE DAILY BEFORE  MEALS 10/21/17   Lucille Passy, MD  glucose blood test strip Use as instructed 04/04/15   Lucille Passy, MD  insulin NPH-regular Human (70-30) 100 UNIT/ML injection Inject 15 Units into the skin 2 (two) times daily with a meal. 03/21/18 07/19/18  Gladstone Lighter, MD  Insulin Syringe-Needle U-100 (B-D INS SYRINGE 0.5CC/30GX1/2") 30G X 1/2" 0.5 ML MISC Use to inject insulin twice a day. 04/04/15   Lucille Passy, MD  latanoprost (XALATAN) 0.005 % ophthalmic solution Place 1 drop into both eyes at bedtime. 04/08/18   [provider]  Omega-3 Fatty Acids (FISH OIL PO) Take 1 capsule by mouth daily.    [provider]  potassium chloride 20 MEQ TBCR Take 20 mEq by mouth daily. While on lasix 03/21/18   Gladstone Lighter, MD  tamsulosin (FLOMAX) 0.4 MG CAPS capsule Take 2 capsules (0.8 mg total) by mouth daily. 03/21/18   Gladstone Lighter, MD  Vitamin D, Ergocalciferol, (DRISDOL) 50000 units CAPS capsule TAKE 1 CAPSULE BY MOUTH ONCE A WEEK Patient taking differently: Takes on Monday 09/09/17   Lucille Passy, MD    Allergies Penicillins    Social History Social History   Tobacco Use  . Smoking status: Never Smoker  . Smokeless tobacco: Former Systems developer    Types: Chew  . Tobacco comment: chewing more than 40 years/chew 1PPD  Substance Use Topics  . Alcohol use: No    Alcohol/week: 0.0 standard drinks  . Drug use: No    Review of Systems Patient denies headaches, rhinorrhea, blurry vision, numbness, shortness of breath, chest pain, edema, cough, abdominal pain, nausea, vomiting, diarrhea, dysuria, fevers, rashes or hallucinations unless otherwise stated above in HPI. ____________________________________________   PHYSICAL EXAM:  VITAL SIGNS: Vitals:   06/08/18 1049 06/08/18 1100  BP:  132/74  Pulse:  (!) 45  Resp:  16  Temp: 97.9 F (36.6 C)   SpO2:  100%    Constitutional: Alert and oriented.  Eyes: Conjunctivae are normal.  Head: Atraumatic. Nose: No congestion/rhinnorhea. Mouth/Throat: Mucous membranes are moist.   Neck: No stridor. Painless ROM.  Cardiovascular: Normal rate, regular rhythm. Grossly normal heart sounds.  Good peripheral circulation. Respiratory: Normal respiratory effort.  No retractions. Lungs CTAB. Gastrointestinal: Soft and nontender. No distention. No abdominal bruits. No CVA tenderness. Genitourinary: Suprapubic catheter appears in  appropriate position.  Dressing is dry. Musculoskeletal: No lower extremity tenderness nor edema.  No joint effusions. Neurologic:  Normal speech and language. No gross focal neurologic deficits are appreciated. No facial droop Skin:  Skin is warm, dry and intact. No rash noted. Psychiatric: Mood and affect are normal. Speech and behavior are normal.  ____________________________________________   LABS (all labs ordered are listed, but only abnormal results are displayed)  Results for orders placed or performed during the hospital encounter of 06/08/18 (from the past 24 hour(s))  Urinalysis, Complete w Microscopic     Status: Abnormal   Collection Time: 06/08/18 10:26 AM  Result Value Ref Range   Color, Urine YELLOW (A) YELLOW   APPearance HAZY (A) CLEAR   Specific Gravity, Urine 1.008 1.005 - 1.030   pH 6.0 5.0 - 8.0   Glucose, UA 50 (A) NEGATIVE mg/dL   Hgb urine dipstick MODERATE (A) NEGATIVE   Bilirubin Urine NEGATIVE NEGATIVE   Ketones, ur NEGATIVE NEGATIVE mg/dL   Protein, ur 100 (A) NEGATIVE mg/dL   Nitrite  NEGATIVE NEGATIVE   Leukocytes,Ua MODERATE (A) NEGATIVE   RBC / HPF >50 (H) 0 - 5 RBC/hpf   WBC, UA >50 (H) 0 - 5 WBC/hpf   Bacteria, UA RARE (A) NONE SEEN   Squamous Epithelial / LPF NONE SEEN 0 - 5   Mucus PRESENT    Hyaline Casts, UA PRESENT   CBC with Differential/Platelet     Status: None   Collection Time: 06/08/18 11:16 AM  Result Value Ref Range   WBC 9.1 4.0 - 10.5 K/uL   RBC 5.43 4.22 - 5.81 MIL/uL   Hemoglobin 15.6 13.0 - 17.0 g/dL   HCT 46.7 39.0 - 52.0 %   MCV 86.0 80.0 - 100.0 fL   MCH 28.7 26.0 - 34.0 pg   MCHC 33.4 30.0 - 36.0 g/dL   RDW 13.9 11.5 - 15.5 %   Platelets 213 150 - 400 K/uL   nRBC 0.0 0.0 - 0.2 %   Neutrophils Relative % 59 %   Neutro Abs 5.5 1.7 - 7.7 K/uL   Lymphocytes Relative 28 %   Lymphs Abs 2.5 0.7 - 4.0 K/uL   Monocytes Relative 8 %   Monocytes Absolute 0.7 0.1 - 1.0 K/uL   Eosinophils Relative 3 %   Eosinophils  Absolute 0.3 0.0 - 0.5 K/uL   Basophils Relative 1 %   Basophils Absolute 0.1 0.0 - 0.1 K/uL   Immature Granulocytes 1 %   Abs Immature Granulocytes 0.06 0.00 - 0.07 K/uL  Basic metabolic panel     Status: Abnormal   Collection Time: 06/08/18 11:16 AM  Result Value Ref Range   Sodium 136 135 - 145 mmol/L   Potassium 4.2 3.5 - 5.1 mmol/L   Chloride 97 (L) 98 - 111 mmol/L   CO2 27 22 - 32 mmol/L   Glucose, Bld 170 (H) 70 - 99 mg/dL   BUN 31 (H) 8 - 23 mg/dL   Creatinine, Ser 1.63 (H) 0.61 - 1.24 mg/dL   Calcium 9.0 8.9 - 10.3 mg/dL   GFR calc non Af Amer 38 (L) >60 mL/min   GFR calc Af Amer 44 (L) >60 mL/min   Anion gap 12 5 - 15   ____________________________________________ RADIOLOGY  I personally reviewed all radiographic images ordered to evaluate for the above acute complaints and reviewed radiology reports and findings.  These findings were personally discussed with the patient.  Please see medical record for radiology report.  ____________________________________________   PROCEDURES  Procedure(s) performed:  Procedures    Critical Care performed: no ____________________________________________   INITIAL IMPRESSION / ASSESSMENT AND PLAN / ED COURSE  Pertinent labs & imaging results that were available during my care of the patient were reviewed by me and considered in my medical decision making (see chart for details).   DDX: Cystitis, obstruction, catheter displacement  SUFYAN MEIDINGER is a 85 y.o. who presents to the ED with symptoms as described above.  Will order CT imaging to confirm placement.  Will check basic blood work.  Clinical Course as of Jun 07 1141  Sun Jun 08, 2018  1126 Catheter was irrigated without any issue.  Notice obstruction.  Blood work reassuring.  Will order urine culture and start antibiotics due to concern for cystitis.  Patient stable appropriate for outpatient follow-up.   [PR]    Clinical Course User Index [PR] Merlyn Lot,  MD    The patient was evaluated in Emergency Department today for the symptoms described in the history of present illness.  He/she was evaluated in the context of the global COVID-19 pandemic, which necessitated consideration that the patient might be at risk for infection with the SARS-CoV-2 virus that causes COVID-19. Institutional protocols and algorithms that pertain to the evaluation of patients at risk for COVID-19 are in a state of rapid change based on information released by regulatory bodies including the CDC and federal and state organizations. These policies and algorithms were followed during the patient's care in the ED.  As part of my medical decision making, I reviewed the following data within the Media notes reviewed and incorporated, Labs reviewed, notes from prior ED visits and [place] Controlled Substance Database   ____________________________________________   FINAL CLINICAL IMPRESSION(S) / ED DIAGNOSES  Final diagnoses:  Suprapubic catheter dysfunction, initial encounter ([place])      NEW MEDICATIONS STARTED DURING THIS VISIT:  New Prescriptions   CEFUROXIME (CEFTIN) 500 MG TABLET    Take 1 tablet (500 mg total) by mouth 2 (two) times daily for 7 days.     Note:  This document was prepared using Dragon voice recognition software and may include unintentional dictation errors.    Merlyn Lot, MD 06/08/18 1143

## 2018-06-08 NOTE — Discharge Instructions (Addendum)
Follow up with urology and pcp.  Return for any additional questions or concerns.

## 2018-06-08 NOTE — ED Triage Notes (Signed)
Pt states he woke up wet this morning, pt has a suprapubic catheter and is concerned it is clogged up.. denies pain at present. States not emptying into the bag today.

## 2018-06-08 NOTE — ED Notes (Addendum)
Pt states he woke up with his pants wet with urine and that his daughter checked his tube and that it is backed up

## 2018-06-09 ENCOUNTER — Other Ambulatory Visit: Payer: Self-pay | Admitting: Radiology

## 2018-06-09 DIAGNOSIS — I872 Venous insufficiency (chronic) (peripheral): Secondary | ICD-10-CM | POA: Diagnosis not present

## 2018-06-09 DIAGNOSIS — M810 Age-related osteoporosis without current pathological fracture: Secondary | ICD-10-CM | POA: Diagnosis not present

## 2018-06-09 DIAGNOSIS — N401 Enlarged prostate with lower urinary tract symptoms: Secondary | ICD-10-CM | POA: Diagnosis not present

## 2018-06-09 DIAGNOSIS — F17200 Nicotine dependence, unspecified, uncomplicated: Secondary | ICD-10-CM | POA: Diagnosis not present

## 2018-06-09 DIAGNOSIS — Z4800 Encounter for change or removal of nonsurgical wound dressing: Secondary | ICD-10-CM | POA: Diagnosis not present

## 2018-06-09 DIAGNOSIS — N184 Chronic kidney disease, stage 4 (severe): Secondary | ICD-10-CM | POA: Diagnosis not present

## 2018-06-09 DIAGNOSIS — A419 Sepsis, unspecified organism: Secondary | ICD-10-CM | POA: Diagnosis not present

## 2018-06-09 DIAGNOSIS — I130 Hypertensive heart and chronic kidney disease with heart failure and stage 1 through stage 4 chronic kidney disease, or unspecified chronic kidney disease: Secondary | ICD-10-CM | POA: Diagnosis not present

## 2018-06-09 DIAGNOSIS — R339 Retention of urine, unspecified: Secondary | ICD-10-CM

## 2018-06-09 DIAGNOSIS — Z794 Long term (current) use of insulin: Secondary | ICD-10-CM | POA: Diagnosis not present

## 2018-06-09 DIAGNOSIS — Z6841 Body Mass Index (BMI) 40.0 and over, adult: Secondary | ICD-10-CM | POA: Diagnosis not present

## 2018-06-09 DIAGNOSIS — E1122 Type 2 diabetes mellitus with diabetic chronic kidney disease: Secondary | ICD-10-CM | POA: Diagnosis not present

## 2018-06-09 DIAGNOSIS — L97422 Non-pressure chronic ulcer of left heel and midfoot with fat layer exposed: Secondary | ICD-10-CM | POA: Diagnosis not present

## 2018-06-09 DIAGNOSIS — Z8701 Personal history of pneumonia (recurrent): Secondary | ICD-10-CM | POA: Diagnosis not present

## 2018-06-09 DIAGNOSIS — E1151 Type 2 diabetes mellitus with diabetic peripheral angiopathy without gangrene: Secondary | ICD-10-CM | POA: Diagnosis not present

## 2018-06-09 DIAGNOSIS — H9190 Unspecified hearing loss, unspecified ear: Secondary | ICD-10-CM | POA: Diagnosis not present

## 2018-06-09 DIAGNOSIS — J189 Pneumonia, unspecified organism: Secondary | ICD-10-CM | POA: Diagnosis not present

## 2018-06-09 DIAGNOSIS — I509 Heart failure, unspecified: Secondary | ICD-10-CM | POA: Diagnosis not present

## 2018-06-09 DIAGNOSIS — E669 Obesity, unspecified: Secondary | ICD-10-CM | POA: Diagnosis not present

## 2018-06-09 DIAGNOSIS — R338 Other retention of urine: Secondary | ICD-10-CM | POA: Diagnosis not present

## 2018-06-09 DIAGNOSIS — Z466 Encounter for fitting and adjustment of urinary device: Secondary | ICD-10-CM | POA: Diagnosis not present

## 2018-06-09 NOTE — Telephone Encounter (Signed)
He is in the hospital right now and is due for his 1 month follow up, should I go ahead and get that scheduled once he gets out? Can you take a look at his notes?  Sharyn Lull

## 2018-06-10 LAB — URINE CULTURE: Culture: 80000 — AB

## 2018-06-10 NOTE — Telephone Encounter (Signed)
Lm for patient to cb to schedule his SPT change per [facility] it can be done early June    Michelle

## 2018-06-10 NOTE — Telephone Encounter (Signed)
Notified daughter of suprapubic tube exchange scheduled in IR on 06/13/2018 at 12:00. Patient does not have to fast prior to procedure as he will not get sedation at this visit. Also scheduled next exchange which will be done in the office. Questions answered. Daughter expresses understanding of conversation.

## 2018-06-11 ENCOUNTER — Other Ambulatory Visit: Payer: Self-pay

## 2018-06-11 ENCOUNTER — Telehealth: Payer: Self-pay | Admitting: Urology

## 2018-06-11 NOTE — Telephone Encounter (Signed)
Damon [name], [facility] pharmacist [facility] that pt was seen in the ER and they did a U/A and culture which showed staph and epi, she wanted to make [facility] aware since pt is sheduled for SPT tube exchange in office on 6/23.  She said abx pt was sent home with does not treat this.  If you have any questions, you can give her a call [phone].

## 2018-06-11 NOTE — Patient Outreach (Signed)
  [facility]) Care Management Chronic Special Needs Program  06/11/2018  Name: Damon [name] DOB: 12-Jun-1933  MRN: [id number]  Mr. Damon [name] is enrolled in a chronic special needs plan for Diabetes. RNCM called to follow up to see if client has received hospital bed. Reviewed and updated care plan.  Subjective: RNCM spoke with client's daughter, Damon [name] who reports client has received hospital bed and is without questions or concerns at this time. Ms. Cregger thanked RNCM for assisting with obtaining hospital bed for client. She denies any issue or concerns at this time.  Goals Addressed            This Visit's Progress   . Client understands the importance of follow-up with providers by attending scheduled visits   On track   . Client verbalize knowledge of Heart Failure disease self management skills within the next 6 months.   On track   . Client will report no fall or injuries in the next within the next 6 months. (pt-stated)   On track   . Client will report no worsening of symptoms related to heart disease within the next 6 months    On track   . COMPLETED: Client will use Assistive Devices as needed and verbalize understanding of device use   On track   . Client will verbalize knowledge of self management of Hypertension as evidences by BP reading of 140/90 or less; or as defined by provider   On track   . Maintain timely refills of diabetic medication as prescribed within the year .   On track   . Obtain annual  Lipid Profile, LDL-C   On track   . Obtain Annual Eye (retinal)  Exam    On track   . Obtain Annual Foot Exam   On track   . Obtain annual screen for micro albuminuria (urine) , nephropathy (kidney problems)   On track   . Obtain Hemoglobin A1C at least 2 times per year   On track   . Visit Primary Care Provider or Endocrinologist at least 2 times per year    On track      Plan: Chronic care management coordinator will outreach in:  6 Months   [name], RN, MSN, [place] [facility] [phone]    .

## 2018-06-12 NOTE — Telephone Encounter (Signed)
Spoke with Abby at Pharmacy informed as instructed and Spoke with Daughter Damon [name] informed per Dr. Dene Gentry instructions verbalized understanding. She stated he has been taking antibitoic and has been improving. Aware to contact the office with any concerns.

## 2018-06-12 NOTE — Telephone Encounter (Signed)
This represents colonization and does not need to be treated.  He is scheduled for SP tube change next month.

## 2018-06-13 ENCOUNTER — Encounter: Payer: Self-pay | Admitting: Interventional Radiology

## 2018-06-13 ENCOUNTER — Ambulatory Visit
Admission: RE | Admit: 2018-06-13 | Discharge: 2018-06-13 | Disposition: A | Discharge status: Home / Self Care | Payer: HMO | Source: Ambulatory Visit | Attending: Urology | Admitting: Urology

## 2018-06-13 ENCOUNTER — Ambulatory Visit: Payer: HMO

## 2018-06-13 ENCOUNTER — Other Ambulatory Visit: Payer: Self-pay

## 2018-06-13 ENCOUNTER — Ambulatory Visit: Admission: RE | Admit: 2018-06-13 | Payer: HMO | Source: Ambulatory Visit

## 2018-06-13 DIAGNOSIS — Z436 Encounter for attention to other artificial openings of urinary tract: Secondary | ICD-10-CM | POA: Diagnosis not present

## 2018-06-13 DIAGNOSIS — R339 Retention of urine, unspecified: Secondary | ICD-10-CM | POA: Diagnosis not present

## 2018-06-13 DIAGNOSIS — N368 Other specified disorders of urethra: Secondary | ICD-10-CM | POA: Diagnosis not present

## 2018-06-13 HISTORY — PX: IR CATHETER TUBE CHANGE: IMG717

## 2018-06-13 MED ORDER — LIDOCAINE HCL (PF) 1 % IJ SOLN
INTRAMUSCULAR | Status: AC
  Filled 2018-06-13: qty 30

## 2018-06-13 NOTE — Procedures (Signed)
Pre Procedure Dx: Urinary retention Post Procedure Dx: Same  Successful fluoroscopic guided exchange and conversion of existing suprapubic catheter to a 16 Fr balloon retention Council catheter.   EBL: None   No immediate complications.   Ronny Bacon, MD Pager #: [phone]

## 2018-06-13 NOTE — Progress Notes (Signed)
Discharged home with daughter.  Pt. Tolerated procedure well.  No distress.

## 2018-06-18 DIAGNOSIS — Z794 Long term (current) use of insulin: Secondary | ICD-10-CM | POA: Diagnosis not present

## 2018-06-18 DIAGNOSIS — B351 Tinea unguium: Secondary | ICD-10-CM | POA: Diagnosis not present

## 2018-06-18 DIAGNOSIS — E1140 Type 2 diabetes mellitus with diabetic neuropathy, unspecified: Secondary | ICD-10-CM | POA: Diagnosis not present

## 2018-06-18 DIAGNOSIS — L851 Acquired keratosis [keratoderma] palmaris et plantaris: Secondary | ICD-10-CM | POA: Diagnosis not present

## 2018-06-19 DIAGNOSIS — E1169 Type 2 diabetes mellitus with other specified complication: Secondary | ICD-10-CM | POA: Diagnosis not present

## 2018-06-19 DIAGNOSIS — E669 Obesity, unspecified: Secondary | ICD-10-CM | POA: Diagnosis not present

## 2018-06-19 DIAGNOSIS — E1142 Type 2 diabetes mellitus with diabetic polyneuropathy: Secondary | ICD-10-CM | POA: Diagnosis not present

## 2018-06-19 DIAGNOSIS — E113293 Type 2 diabetes mellitus with mild nonproliferative diabetic retinopathy without macular edema, bilateral: Secondary | ICD-10-CM | POA: Diagnosis not present

## 2018-06-19 DIAGNOSIS — N183 Chronic kidney disease, stage 3 (moderate): Secondary | ICD-10-CM | POA: Diagnosis not present

## 2018-06-19 DIAGNOSIS — Z794 Long term (current) use of insulin: Secondary | ICD-10-CM | POA: Diagnosis not present

## 2018-06-19 DIAGNOSIS — E1122 Type 2 diabetes mellitus with diabetic chronic kidney disease: Secondary | ICD-10-CM | POA: Diagnosis not present

## 2018-06-19 DIAGNOSIS — E1165 Type 2 diabetes mellitus with hyperglycemia: Secondary | ICD-10-CM | POA: Diagnosis not present

## 2018-06-24 DIAGNOSIS — H401131 Primary open-angle glaucoma, bilateral, mild stage: Secondary | ICD-10-CM | POA: Diagnosis not present

## 2018-06-25 DIAGNOSIS — N401 Enlarged prostate with lower urinary tract symptoms: Secondary | ICD-10-CM | POA: Diagnosis not present

## 2018-06-25 DIAGNOSIS — I509 Heart failure, unspecified: Secondary | ICD-10-CM | POA: Diagnosis not present

## 2018-06-25 DIAGNOSIS — A419 Sepsis, unspecified organism: Secondary | ICD-10-CM | POA: Diagnosis not present

## 2018-06-25 DIAGNOSIS — H9190 Unspecified hearing loss, unspecified ear: Secondary | ICD-10-CM | POA: Diagnosis not present

## 2018-06-25 DIAGNOSIS — M810 Age-related osteoporosis without current pathological fracture: Secondary | ICD-10-CM | POA: Diagnosis not present

## 2018-06-25 DIAGNOSIS — R338 Other retention of urine: Secondary | ICD-10-CM | POA: Diagnosis not present

## 2018-06-25 DIAGNOSIS — Z8701 Personal history of pneumonia (recurrent): Secondary | ICD-10-CM | POA: Diagnosis not present

## 2018-06-25 DIAGNOSIS — F17200 Nicotine dependence, unspecified, uncomplicated: Secondary | ICD-10-CM | POA: Diagnosis not present

## 2018-06-25 DIAGNOSIS — Z6841 Body Mass Index (BMI) 40.0 and over, adult: Secondary | ICD-10-CM | POA: Diagnosis not present

## 2018-06-25 DIAGNOSIS — I872 Venous insufficiency (chronic) (peripheral): Secondary | ICD-10-CM | POA: Diagnosis not present

## 2018-06-25 DIAGNOSIS — L97422 Non-pressure chronic ulcer of left heel and midfoot with fat layer exposed: Secondary | ICD-10-CM | POA: Diagnosis not present

## 2018-06-25 DIAGNOSIS — E1151 Type 2 diabetes mellitus with diabetic peripheral angiopathy without gangrene: Secondary | ICD-10-CM | POA: Diagnosis not present

## 2018-06-25 DIAGNOSIS — Z4800 Encounter for change or removal of nonsurgical wound dressing: Secondary | ICD-10-CM | POA: Diagnosis not present

## 2018-06-25 DIAGNOSIS — E669 Obesity, unspecified: Secondary | ICD-10-CM | POA: Diagnosis not present

## 2018-06-25 DIAGNOSIS — N184 Chronic kidney disease, stage 4 (severe): Secondary | ICD-10-CM | POA: Diagnosis not present

## 2018-06-25 DIAGNOSIS — Z466 Encounter for fitting and adjustment of urinary device: Secondary | ICD-10-CM | POA: Diagnosis not present

## 2018-06-25 DIAGNOSIS — Z794 Long term (current) use of insulin: Secondary | ICD-10-CM | POA: Diagnosis not present

## 2018-06-25 DIAGNOSIS — J189 Pneumonia, unspecified organism: Secondary | ICD-10-CM | POA: Diagnosis not present

## 2018-06-25 DIAGNOSIS — E1122 Type 2 diabetes mellitus with diabetic chronic kidney disease: Secondary | ICD-10-CM | POA: Diagnosis not present

## 2018-06-25 DIAGNOSIS — I130 Hypertensive heart and chronic kidney disease with heart failure and stage 1 through stage 4 chronic kidney disease, or unspecified chronic kidney disease: Secondary | ICD-10-CM | POA: Diagnosis not present

## 2018-07-02 DIAGNOSIS — E113552 Type 2 diabetes mellitus with stable proliferative diabetic retinopathy, left eye: Secondary | ICD-10-CM | POA: Diagnosis not present

## 2018-07-02 DIAGNOSIS — H26491 Other secondary cataract, right eye: Secondary | ICD-10-CM | POA: Diagnosis not present

## 2018-07-02 DIAGNOSIS — H3562 Retinal hemorrhage, left eye: Secondary | ICD-10-CM | POA: Diagnosis not present

## 2018-07-02 DIAGNOSIS — E113591 Type 2 diabetes mellitus with proliferative diabetic retinopathy without macular edema, right eye: Secondary | ICD-10-CM | POA: Diagnosis not present

## 2018-07-05 DIAGNOSIS — E1149 Type 2 diabetes mellitus with other diabetic neurological complication: Secondary | ICD-10-CM | POA: Diagnosis not present

## 2018-07-05 DIAGNOSIS — R600 Localized edema: Secondary | ICD-10-CM | POA: Diagnosis not present

## 2018-07-05 DIAGNOSIS — N189 Chronic kidney disease, unspecified: Secondary | ICD-10-CM | POA: Diagnosis not present

## 2018-07-09 DIAGNOSIS — N184 Chronic kidney disease, stage 4 (severe): Secondary | ICD-10-CM | POA: Diagnosis not present

## 2018-07-09 DIAGNOSIS — F17200 Nicotine dependence, unspecified, uncomplicated: Secondary | ICD-10-CM | POA: Diagnosis not present

## 2018-07-09 DIAGNOSIS — N401 Enlarged prostate with lower urinary tract symptoms: Secondary | ICD-10-CM | POA: Diagnosis not present

## 2018-07-09 DIAGNOSIS — I130 Hypertensive heart and chronic kidney disease with heart failure and stage 1 through stage 4 chronic kidney disease, or unspecified chronic kidney disease: Secondary | ICD-10-CM | POA: Diagnosis not present

## 2018-07-09 DIAGNOSIS — R338 Other retention of urine: Secondary | ICD-10-CM | POA: Diagnosis not present

## 2018-07-09 DIAGNOSIS — J189 Pneumonia, unspecified organism: Secondary | ICD-10-CM | POA: Diagnosis not present

## 2018-07-09 DIAGNOSIS — I872 Venous insufficiency (chronic) (peripheral): Secondary | ICD-10-CM | POA: Diagnosis not present

## 2018-07-09 DIAGNOSIS — E1122 Type 2 diabetes mellitus with diabetic chronic kidney disease: Secondary | ICD-10-CM | POA: Diagnosis not present

## 2018-07-09 DIAGNOSIS — L97422 Non-pressure chronic ulcer of left heel and midfoot with fat layer exposed: Secondary | ICD-10-CM | POA: Diagnosis not present

## 2018-07-09 DIAGNOSIS — Z8701 Personal history of pneumonia (recurrent): Secondary | ICD-10-CM | POA: Diagnosis not present

## 2018-07-09 DIAGNOSIS — E669 Obesity, unspecified: Secondary | ICD-10-CM | POA: Diagnosis not present

## 2018-07-09 DIAGNOSIS — I509 Heart failure, unspecified: Secondary | ICD-10-CM | POA: Diagnosis not present

## 2018-07-09 DIAGNOSIS — A419 Sepsis, unspecified organism: Secondary | ICD-10-CM | POA: Diagnosis not present

## 2018-07-09 DIAGNOSIS — Z794 Long term (current) use of insulin: Secondary | ICD-10-CM | POA: Diagnosis not present

## 2018-07-09 DIAGNOSIS — E1151 Type 2 diabetes mellitus with diabetic peripheral angiopathy without gangrene: Secondary | ICD-10-CM | POA: Diagnosis not present

## 2018-07-09 DIAGNOSIS — H9190 Unspecified hearing loss, unspecified ear: Secondary | ICD-10-CM | POA: Diagnosis not present

## 2018-07-09 DIAGNOSIS — M810 Age-related osteoporosis without current pathological fracture: Secondary | ICD-10-CM | POA: Diagnosis not present

## 2018-07-09 DIAGNOSIS — Z4800 Encounter for change or removal of nonsurgical wound dressing: Secondary | ICD-10-CM | POA: Diagnosis not present

## 2018-07-09 DIAGNOSIS — Z6841 Body Mass Index (BMI) 40.0 and over, adult: Secondary | ICD-10-CM | POA: Diagnosis not present

## 2018-07-09 DIAGNOSIS — Z466 Encounter for fitting and adjustment of urinary device: Secondary | ICD-10-CM | POA: Diagnosis not present

## 2018-07-15 ENCOUNTER — Encounter: Payer: Self-pay | Admitting: Urology

## 2018-07-15 ENCOUNTER — Other Ambulatory Visit: Payer: Self-pay

## 2018-07-15 ENCOUNTER — Ambulatory Visit (INDEPENDENT_AMBULATORY_CARE_PROVIDER_SITE_OTHER): Payer: HMO | Admitting: Urology

## 2018-07-15 VITALS — BP 140/69 | HR 55

## 2018-07-15 DIAGNOSIS — R339 Retention of urine, unspecified: Secondary | ICD-10-CM | POA: Insufficient documentation

## 2018-07-15 NOTE — Progress Notes (Signed)
Suprapubic Cath Change  Patient is present today for initial office suprapubic catheter change due to urinary retention.  10 ml of water was drained from the balloon, a 16 FR foley cath was removed from the tract with out difficulty.  Site was cleaned and prepped in a sterile fashion with betadine.  A 16 FR foley cath was replaced into the tract no complications were noted. Urine return was noted, 10 ml of sterile water was inflated into the balloon and a leg bag was attached for drainage.  Patient tolerated well. A night bag was given to patient and proper instruction was given on how to switch bags.    Performed by: John Giovanni, MD  Follow up: 1 month nurse visit/SP tube change

## 2018-07-18 DIAGNOSIS — E113293 Type 2 diabetes mellitus with mild nonproliferative diabetic retinopathy without macular edema, bilateral: Secondary | ICD-10-CM | POA: Diagnosis not present

## 2018-07-18 DIAGNOSIS — Z794 Long term (current) use of insulin: Secondary | ICD-10-CM | POA: Diagnosis not present

## 2018-07-18 DIAGNOSIS — E1169 Type 2 diabetes mellitus with other specified complication: Secondary | ICD-10-CM | POA: Diagnosis not present

## 2018-07-18 DIAGNOSIS — Z6838 Body mass index (BMI) 38.0-38.9, adult: Secondary | ICD-10-CM | POA: Diagnosis not present

## 2018-07-18 DIAGNOSIS — E1142 Type 2 diabetes mellitus with diabetic polyneuropathy: Secondary | ICD-10-CM | POA: Diagnosis not present

## 2018-07-18 DIAGNOSIS — E1122 Type 2 diabetes mellitus with diabetic chronic kidney disease: Secondary | ICD-10-CM | POA: Diagnosis not present

## 2018-07-18 DIAGNOSIS — E669 Obesity, unspecified: Secondary | ICD-10-CM | POA: Diagnosis not present

## 2018-07-18 DIAGNOSIS — N183 Chronic kidney disease, stage 3 (moderate): Secondary | ICD-10-CM | POA: Diagnosis not present

## 2018-07-18 DIAGNOSIS — E1165 Type 2 diabetes mellitus with hyperglycemia: Secondary | ICD-10-CM | POA: Diagnosis not present

## 2018-07-24 DIAGNOSIS — E1149 Type 2 diabetes mellitus with other diabetic neurological complication: Secondary | ICD-10-CM | POA: Diagnosis not present

## 2018-07-27 ENCOUNTER — Encounter: Payer: Self-pay | Admitting: Urology

## 2018-07-28 ENCOUNTER — Telehealth: Payer: Self-pay

## 2018-07-28 NOTE — Telephone Encounter (Signed)
Spoke with patient's daughter in regards to [place] message of discomfort with suprapubic tube. She states patient's catheter is draining fine denies fever chills nausea or vomiting. She states he has some discomfort when she cleans around the tube daily and sometimes with movement. It was discussed to try and secure tube with paper tape to keep in place to minimize discomfort and make sure pants were loose and not pushing on tube. Patient's daughter verbalized understanding

## 2018-07-30 DIAGNOSIS — Z794 Long term (current) use of insulin: Secondary | ICD-10-CM | POA: Diagnosis not present

## 2018-07-30 DIAGNOSIS — E1151 Type 2 diabetes mellitus with diabetic peripheral angiopathy without gangrene: Secondary | ICD-10-CM | POA: Diagnosis not present

## 2018-07-30 DIAGNOSIS — Z8631 Personal history of diabetic foot ulcer: Secondary | ICD-10-CM | POA: Diagnosis not present

## 2018-07-30 DIAGNOSIS — E669 Obesity, unspecified: Secondary | ICD-10-CM | POA: Diagnosis not present

## 2018-07-30 DIAGNOSIS — N401 Enlarged prostate with lower urinary tract symptoms: Secondary | ICD-10-CM | POA: Diagnosis not present

## 2018-07-30 DIAGNOSIS — N184 Chronic kidney disease, stage 4 (severe): Secondary | ICD-10-CM | POA: Diagnosis not present

## 2018-07-30 DIAGNOSIS — F17200 Nicotine dependence, unspecified, uncomplicated: Secondary | ICD-10-CM | POA: Diagnosis not present

## 2018-07-30 DIAGNOSIS — Z6841 Body Mass Index (BMI) 40.0 and over, adult: Secondary | ICD-10-CM | POA: Diagnosis not present

## 2018-07-30 DIAGNOSIS — M810 Age-related osteoporosis without current pathological fracture: Secondary | ICD-10-CM | POA: Diagnosis not present

## 2018-07-30 DIAGNOSIS — I872 Venous insufficiency (chronic) (peripheral): Secondary | ICD-10-CM | POA: Diagnosis not present

## 2018-07-30 DIAGNOSIS — R338 Other retention of urine: Secondary | ICD-10-CM | POA: Diagnosis not present

## 2018-07-30 DIAGNOSIS — I130 Hypertensive heart and chronic kidney disease with heart failure and stage 1 through stage 4 chronic kidney disease, or unspecified chronic kidney disease: Secondary | ICD-10-CM | POA: Diagnosis not present

## 2018-07-30 DIAGNOSIS — E1122 Type 2 diabetes mellitus with diabetic chronic kidney disease: Secondary | ICD-10-CM | POA: Diagnosis not present

## 2018-07-30 DIAGNOSIS — H9190 Unspecified hearing loss, unspecified ear: Secondary | ICD-10-CM | POA: Diagnosis not present

## 2018-07-30 DIAGNOSIS — I509 Heart failure, unspecified: Secondary | ICD-10-CM | POA: Diagnosis not present

## 2018-08-01 DIAGNOSIS — N183 Chronic kidney disease, stage 3 (moderate): Secondary | ICD-10-CM | POA: Diagnosis not present

## 2018-08-01 DIAGNOSIS — E781 Pure hyperglyceridemia: Secondary | ICD-10-CM | POA: Diagnosis not present

## 2018-08-01 DIAGNOSIS — Z794 Long term (current) use of insulin: Secondary | ICD-10-CM | POA: Diagnosis not present

## 2018-08-01 DIAGNOSIS — E1142 Type 2 diabetes mellitus with diabetic polyneuropathy: Secondary | ICD-10-CM | POA: Diagnosis not present

## 2018-08-01 DIAGNOSIS — E113293 Type 2 diabetes mellitus with mild nonproliferative diabetic retinopathy without macular edema, bilateral: Secondary | ICD-10-CM | POA: Diagnosis not present

## 2018-08-01 DIAGNOSIS — E039 Hypothyroidism, unspecified: Secondary | ICD-10-CM | POA: Diagnosis not present

## 2018-08-01 DIAGNOSIS — E1165 Type 2 diabetes mellitus with hyperglycemia: Secondary | ICD-10-CM | POA: Diagnosis not present

## 2018-08-01 DIAGNOSIS — Z0001 Encounter for general adult medical examination with abnormal findings: Secondary | ICD-10-CM | POA: Diagnosis not present

## 2018-08-01 DIAGNOSIS — I10 Essential (primary) hypertension: Secondary | ICD-10-CM | POA: Diagnosis not present

## 2018-08-04 DIAGNOSIS — E1149 Type 2 diabetes mellitus with other diabetic neurological complication: Secondary | ICD-10-CM | POA: Diagnosis not present

## 2018-08-04 DIAGNOSIS — I872 Venous insufficiency (chronic) (peripheral): Secondary | ICD-10-CM | POA: Diagnosis not present

## 2018-08-04 DIAGNOSIS — E1151 Type 2 diabetes mellitus with diabetic peripheral angiopathy without gangrene: Secondary | ICD-10-CM | POA: Diagnosis not present

## 2018-08-04 DIAGNOSIS — R338 Other retention of urine: Secondary | ICD-10-CM | POA: Diagnosis not present

## 2018-08-04 DIAGNOSIS — I130 Hypertensive heart and chronic kidney disease with heart failure and stage 1 through stage 4 chronic kidney disease, or unspecified chronic kidney disease: Secondary | ICD-10-CM | POA: Diagnosis not present

## 2018-08-04 DIAGNOSIS — E1122 Type 2 diabetes mellitus with diabetic chronic kidney disease: Secondary | ICD-10-CM | POA: Diagnosis not present

## 2018-08-04 DIAGNOSIS — N189 Chronic kidney disease, unspecified: Secondary | ICD-10-CM | POA: Diagnosis not present

## 2018-08-04 DIAGNOSIS — N401 Enlarged prostate with lower urinary tract symptoms: Secondary | ICD-10-CM | POA: Diagnosis not present

## 2018-08-04 DIAGNOSIS — N184 Chronic kidney disease, stage 4 (severe): Secondary | ICD-10-CM | POA: Diagnosis not present

## 2018-08-04 DIAGNOSIS — I509 Heart failure, unspecified: Secondary | ICD-10-CM | POA: Diagnosis not present

## 2018-08-04 DIAGNOSIS — R600 Localized edema: Secondary | ICD-10-CM | POA: Diagnosis not present

## 2018-08-14 ENCOUNTER — Other Ambulatory Visit: Payer: Self-pay

## 2018-08-14 ENCOUNTER — Ambulatory Visit (INDEPENDENT_AMBULATORY_CARE_PROVIDER_SITE_OTHER): Payer: HMO | Admitting: Family Medicine

## 2018-08-14 VITALS — BP 125/71 | HR 59

## 2018-08-14 DIAGNOSIS — R339 Retention of urine, unspecified: Secondary | ICD-10-CM | POA: Diagnosis not present

## 2018-08-14 NOTE — Progress Notes (Signed)
Suprapubic Cath Change  Patient is present today for a suprapubic catheter change due to urinary retention.  30ml of water was drained from the balloon, a 16FR foley cath was removed from the tract with out difficulty.  Site was cleaned and prepped in a sterile fashion with betadine.  A 16FR foley cath was replaced into the tract no complications were noted. Urine return was noted, 10 ml of sterile water was inflated into the balloon.  Patient tolerated well. A night bag was given to patient. Preformed by: Elberta Leatherwood, CMA  Follow up: 1 month

## 2018-08-26 ENCOUNTER — Emergency Department
Admission: EM | Admit: 2018-08-26 | Discharge: 2018-08-27 | Disposition: A | Discharge status: Home / Self Care | Payer: HMO | Attending: Emergency Medicine | Admitting: Emergency Medicine

## 2018-08-26 ENCOUNTER — Emergency Department: Payer: HMO

## 2018-08-26 ENCOUNTER — Other Ambulatory Visit: Payer: Self-pay

## 2018-08-26 DIAGNOSIS — R609 Edema, unspecified: Secondary | ICD-10-CM | POA: Insufficient documentation

## 2018-08-26 DIAGNOSIS — N189 Chronic kidney disease, unspecified: Secondary | ICD-10-CM | POA: Insufficient documentation

## 2018-08-26 DIAGNOSIS — I509 Heart failure, unspecified: Secondary | ICD-10-CM | POA: Insufficient documentation

## 2018-08-26 DIAGNOSIS — E119 Type 2 diabetes mellitus without complications: Secondary | ICD-10-CM | POA: Diagnosis not present

## 2018-08-26 DIAGNOSIS — Z20828 Contact with and (suspected) exposure to other viral communicable diseases: Secondary | ICD-10-CM | POA: Insufficient documentation

## 2018-08-26 DIAGNOSIS — Z79899 Other long term (current) drug therapy: Secondary | ICD-10-CM | POA: Diagnosis not present

## 2018-08-26 DIAGNOSIS — Z7982 Long term (current) use of aspirin: Secondary | ICD-10-CM | POA: Diagnosis not present

## 2018-08-26 DIAGNOSIS — R0602 Shortness of breath: Secondary | ICD-10-CM | POA: Diagnosis not present

## 2018-08-26 DIAGNOSIS — I130 Hypertensive heart and chronic kidney disease with heart failure and stage 1 through stage 4 chronic kidney disease, or unspecified chronic kidney disease: Secondary | ICD-10-CM | POA: Insufficient documentation

## 2018-08-26 DIAGNOSIS — R109 Unspecified abdominal pain: Secondary | ICD-10-CM | POA: Diagnosis not present

## 2018-08-26 HISTORY — DX: Unspecified dementia, unspecified severity, without behavioral disturbance, psychotic disturbance, mood disturbance, and anxiety: F03.90

## 2018-08-26 LAB — LIPASE, BLOOD: Lipase: 27 U/L (ref 11–51)

## 2018-08-26 LAB — COMPREHENSIVE METABOLIC PANEL
ALT: 15 U/L (ref 0–44)
AST: 17 U/L (ref 15–41)
Albumin: 4.1 g/dL (ref 3.5–5.0)
Alkaline Phosphatase: 150 U/L — ABNORMAL HIGH (ref 38–126)
Anion gap: 13 (ref 5–15)
BUN: 32 mg/dL — ABNORMAL HIGH (ref 8–23)
CO2: 23 mmol/L (ref 22–32)
Calcium: 9 mg/dL (ref 8.9–10.3)
Chloride: 101 mmol/L (ref 98–111)
Creatinine, Ser: 1.53 mg/dL — ABNORMAL HIGH (ref 0.61–1.24)
GFR calc Af Amer: 48 mL/min — ABNORMAL LOW (ref >60)
GFR calc non Af Amer: 41 mL/min — ABNORMAL LOW (ref >60)
Glucose, Bld: 310 mg/dL — ABNORMAL HIGH (ref 70–99)
Potassium: 4 mmol/L (ref 3.5–5.1)
Sodium: 137 mmol/L (ref 135–145)
Total Bilirubin: 0.6 mg/dL (ref 0.3–1.2)
Total Protein: 8.4 g/dL — ABNORMAL HIGH (ref 6.5–8.1)

## 2018-08-26 LAB — URINALYSIS, COMPLETE (UACMP) WITH MICROSCOPIC
Bilirubin Urine: NEGATIVE
Glucose, UA: >=500 mg/dL — AB
Ketones, ur: NEGATIVE mg/dL
Nitrite: NEGATIVE
Protein, ur: 100 mg/dL — AB
Specific Gravity, Urine: 1.015 (ref 1.005–1.030)
Squamous Epithelial / LPF: NONE SEEN (ref 0–5)
WBC, UA: >50 WBC/hpf — ABNORMAL HIGH (ref 0–5)
pH: 5 (ref 5.0–8.0)

## 2018-08-26 LAB — CBC
HCT: 51.8 % (ref 39.0–52.0)
Hemoglobin: 16.9 g/dL (ref 13.0–17.0)
MCH: 28.3 pg (ref 26.0–34.0)
MCHC: 32.6 g/dL (ref 30.0–36.0)
MCV: 86.6 fL (ref 80.0–100.0)
Platelets: 226 10*3/uL (ref 150–400)
RBC: 5.98 MIL/uL — ABNORMAL HIGH (ref 4.22–5.81)
RDW: 14.6 % (ref 11.5–15.5)
WBC: 7.2 10*3/uL (ref 4.0–10.5)
nRBC: 0 % (ref 0.0–0.2)

## 2018-08-26 LAB — SARS CORONAVIRUS 2 BY RT PCR (HOSPITAL ORDER, PERFORMED IN ~~LOC~~ HOSPITAL LAB): SARS Coronavirus 2: NEGATIVE

## 2018-08-26 LAB — BRAIN NATRIURETIC PEPTIDE: B Natriuretic Peptide: 232 pg/mL — ABNORMAL HIGH (ref 0.0–100.0)

## 2018-08-26 MED ORDER — IOHEXOL 300 MG/ML  SOLN
75.0000 mL | Freq: Once | INTRAMUSCULAR | Status: AC | PRN
  Administered 2018-08-26: 75 mL via INTRAVENOUS

## 2018-08-26 MED ORDER — SODIUM CHLORIDE 0.9% FLUSH
3.0000 mL | Freq: Once | INTRAVENOUS | Status: AC
  Administered 2018-08-26: 3 mL via INTRAVENOUS

## 2018-08-26 MED ORDER — FUROSEMIDE 10 MG/ML IJ SOLN
40.0000 mg | Freq: Once | INTRAMUSCULAR | Status: AC
  Administered 2018-08-26: 40 mg via INTRAVENOUS
  Filled 2018-08-26: qty 4

## 2018-08-26 NOTE — ED Notes (Signed)
Daughter at bedside. Pt resting in bed and denies abd pain at this time.

## 2018-08-26 NOTE — ED Provider Notes (Signed)
[facility] Emergency Department Provider Note  ____________________________________________   First MD Initiated Contact with Patient 08/26/18 2040     (approximate)  I have reviewed the triage vital signs and the nursing notes.   HISTORY  Chief Complaint Abdominal Pain    HPI Damon [name] is a 85 y.o. male with CHF, diabetes, dementia who presents with abdominal pain.  Patient is had 3 days of increasing abdominal pain associate with abdominal distention.  The abdominal pain has been intermittent, nothing makes it on, nothing makes it better, over his entire abdomen.  He is also had some leg swelling and some shortness of breath associated with it.  Denies any fevers.            Past Medical History:  Diagnosis Date   Arrhythmia    Baseline bradycardia   Bradycardia    CHF (congestive heart failure) (HCC)    Chronic kidney disease    BAseline creatinine 1.4 to 1.5   Dementia ([place])    Diabetes mellitus    Hard of hearing    Hypertension     Patient Active Problem List   Diagnosis Date Noted   Urinary retention 07/15/2018   DM type 2 with diabetic peripheral neuropathy ([place]) 04/07/2018   Bilateral leg edema 03/18/2018   Sepsis ([place]) 03/03/2018   Cutaneous horn 08/13/2016   Visit for well man health check 08/07/2016   Mood swings 10/11/2015   Chronic renal disease 09/11/2013   Hypoglycemia 84/16/6063   Diastolic CHF ([place]) 01/60/1093   Hyperlipidemia 09/09/2013   Urge incontinence 11/19/2012   Incomplete emptying of bladder 11/19/2012   Enlarged prostate with lower urinary tract symptoms (LUTS) 11/19/2012   Benign localized hyperplasia of prostate with urinary obstruction and other lower urinary tract symptoms (LUTS)(600.21) 11/19/2012   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 Authorizing Provider  amLODipine (NORVASC) 5 MG tablet Take 1 tablet by mouth 2 (two) times daily.    [provider]  aspirin 81 MG tablet Take 81 mg by mouth daily.      [provider]  chlorpheniramine-HYDROcodone (TUSSIONEX) 10-8 MG/5ML SUER Take 5 mLs by mouth every 12 (twelve) hours. 03/21/18   Gladstone Lighter, MD  finasteride (PROSCAR) 5 MG tablet Take 1 tablet (5 mg total) by mouth daily. 03/22/18   Gladstone Lighter, MD  furosemide (LASIX) 40 MG tablet Take 1 tablet (40 mg total) by mouth daily. 03/21/18 06/19/18  Gladstone Lighter, MD  gemfibrozil (LOPID) 600 MG tablet TAKE 1 TABLET BY MOUTH TWICE DAILY BEFORE  MEALS 10/21/17   Lucille Passy, MD  glucose blood test strip Use as instructed 04/04/15   Lucille Passy, MD  insulin NPH-regular Human (70-30) 100 UNIT/ML injection Inject 15 Units into the skin 2 (two) times daily with a meal. 03/21/18 07/19/18  Gladstone Lighter, MD  Insulin Syringe-Needle U-100 (B-D INS SYRINGE 0.5CC/30GX1/2") 30G X 1/2" 0.5 ML MISC Use to  inject insulin twice a day. 04/04/15   Lucille Passy, MD  latanoprost (XALATAN) 0.005 % ophthalmic solution Place 1 drop into both eyes at bedtime. 04/08/18   [provider]  metFORMIN (GLUCOPHAGE-XR) 500 MG 24 hr tablet Take by mouth. 06/19/18 06/19/19  [provider]  Omega-3 Fatty Acids (FISH OIL PO) Take 1 capsule by mouth daily.    [provider]  potassium chloride 20 MEQ TBCR Take 20 mEq by mouth daily. While on lasix 03/21/18   Gladstone Lighter, MD  potassium  chloride SA (K-DUR) 20 MEQ tablet Take by mouth. 05/09/18   [provider]  tamsulosin (FLOMAX) 0.4 MG CAPS capsule Take 2 capsules (0.8 mg total) by mouth daily. 03/21/18   Gladstone Lighter, MD  Vitamin D, Ergocalciferol, (DRISDOL) 50000 units CAPS capsule TAKE 1 CAPSULE BY MOUTH ONCE A WEEK Patient taking differently: Takes on Monday 09/09/17   Lucille Passy, MD    Allergies Penicillins  Family History  Problem Relation Age of Onset   Diabetes type II Mother    Hypertension Mother    Diabetes type II Father    Hypertension Father     Social History Social History   Tobacco Use   Smoking status: Never Smoker   Smokeless tobacco: Former Systems developer    Types: Chew   Tobacco comment: chewing more than 40 years/chew 1PPD  Substance Use Topics   Alcohol use: No    Alcohol/week: 0.0 standard drinks   Drug use: No      Review of Systems Constitutional: No fever/chills Eyes: No visual changes. ENT: No sore throat. Cardiovascular: Denies chest pain. Respiratory: Positive shortness of breath Gastrointestinal: Positive abdominal pain and swelling no nausea, no vomiting.  No diarrhea.  No constipation. Genitourinary: Negative for dysuria. Musculoskeletal: Negative for back pain.  Positive leg swelling Skin: Negative for rash. Neurological: Negative for headaches, focal weakness or numbness. All other ROS negative ____________________________________________   PHYSICAL EXAM:  VITAL SIGNS: ED Triage Vitals  Enc Vitals Group     BP 08/26/18 2011 (!) 151/68     Pulse Rate 08/26/18 2011 67     Resp 08/26/18 2011 18     Temp 08/26/18 2011 97.8 F (36.6 C)     Temp Source 08/26/18 2011 Oral     SpO2 08/26/18 2011 95 %     Weight 08/26/18 2013 220 lb (99.8 kg)     Height 08/26/18 2013 5\' 6"  (1.676 m)     Head Circumference --      Peak Flow --      Pain Score --      Pain Loc --      Pain Edu? --      Excl. in Industry? --     Constitutional: Alert and  oriented. Well appearing and in no acute distress. Eyes: Conjunctivae are normal. EOMI. Head: Atraumatic. Nose: No congestion/rhinnorhea. Mouth/Throat: Mucous membranes are moist.   Neck: No stridor. Trachea Midline. FROM Cardiovascular: Normal rate, regular rhythm. Grossly normal heart sounds.  Good peripheral circulation. Respiratory: Normal respiratory effort.  No retractions. Lungs CTAB. Gastrointestinal: Soft and nontender.  Distended abdomen no abdominal bruits.  Musculoskeletal: 1+ edema bilaterally no joint effusions. Neurologic:  Normal speech and language. No gross focal neurologic deficits are appreciated.  Skin:  Skin is warm, dry and intact. No rash noted. Psychiatric: Mood and affect are normal. Speech and behavior are normal. GU: Deferred   ____________________________________________   LABS (all labs ordered are listed, but only  abnormal results are displayed)  Labs Reviewed  COMPREHENSIVE METABOLIC PANEL - Abnormal; Notable for the following components:      Result Value   Glucose, Bld 310 (*)    BUN 32 (*)    Creatinine, Ser 1.53 (*)    Total Protein 8.4 (*)    Alkaline Phosphatase 150 (*)    GFR calc non Af Amer 41 (*)    GFR calc Af Amer 48 (*)    All other components within normal limits  CBC - Abnormal; Notable for the following components:   RBC 5.98 (*)    All other components within normal limits  URINALYSIS, COMPLETE (UACMP) WITH MICROSCOPIC - Abnormal; Notable for the following components:   Color, Urine YELLOW (*)    APPearance CLOUDY (*)    Glucose, UA >=500 (*)    Hgb urine dipstick SMALL (*)    Protein, ur 100 (*)    Leukocytes,Ua LARGE (*)    WBC, UA >50 (*)    Bacteria, UA FEW (*)    All other components within normal limits  BRAIN NATRIURETIC PEPTIDE - Abnormal; Notable for the following components:   B Natriuretic Peptide 232.0 (*)    All other components within normal limits  SARS CORONAVIRUS 2 (HOSPITAL ORDER, [place] [facility])  LIPASE, BLOOD   ____________________________________________   ED ECG REPORT I, Vanessa [place], the attending physician, personally viewed and interpreted this ECG. EKG shows sinus rate of 61, no ST elevation, T wave inversion in aVL, left bundle branch block ____________________________________________  RADIOLOGY I, Vanessa [place], personally viewed and evaluated these images (plain radiographs) as part of my medical decision making, as well as reviewing the written report by the radiologist.  ED MD interpretation: Chest x-ray without evidence of pneumonia  Official radiology report(s): Dg Chest 1 View  Result Date: 08/26/2018 CLINICAL DATA:  Shortness of breath EXAM: CHEST  1 VIEW COMPARISON:  03/19/2018 FINDINGS: Cardiac shadow is within normal limits. Aortic calcifications are noted. The lungs are well aerated bilaterally. Mild scarring is noted within the left mid lung. No focal infiltrate or sizable effusion is seen. No bony abnormality is noted. IMPRESSION: No acute abnormality noted. Electronically Signed   By: Inez Catalina [name]   On: 08/26/2018 21:10   Ct Abdomen Pelvis W Contrast  Result Date: 08/26/2018 CLINICAL DATA:  Abdominal pain and shortness of breath EXAM: CT ABDOMEN AND PELVIS WITH CONTRAST TECHNIQUE: Multidetector CT imaging of the abdomen and pelvis was performed using the standard protocol following bolus administration of intravenous contrast. CONTRAST:  32mL OMNIPAQUE IOHEXOL 300 MG/ML  SOLN COMPARISON:  03/03/2018 FINDINGS: Lower chest: No acute abnormality. Hepatobiliary: No focal liver abnormality is seen. No gallstones, gallbladder wall thickening, or biliary dilatation. Pancreas: Unremarkable. No pancreatic ductal dilatation or surrounding inflammatory changes. Spleen: Normal in size without focal abnormality. Adrenals/Urinary Tract: The adrenal glands are within normal limits. Kidneys are well visualized bilaterally without renal calculi or  obstructive change. The bladder is decompressed by a suprapubic catheter. Tiny left renal cyst is noted measuring less than 1 cm. This is stable from the prior exam. Stomach/Bowel: Stomach is within normal limits. Appendix appears normal. No evidence of bowel wall thickening, distention, or inflammatory changes. Vascular/Lymphatic: Aortic atherosclerosis. No enlarged abdominal or pelvic lymph nodes. Reproductive: Prostate is unremarkable. Other: No abdominal wall hernia or abnormality. No abdominopelvic ascites. Musculoskeletal: Degenerative changes of the lumbar spine are noted. IMPRESSION: No acute abnormality noted. No significant interval change from  the prior exam. Electronically Signed   By: Inez Catalina [name]   On: 08/26/2018 22:51    ____________________________________________   PROCEDURES  Procedure(s) performed (including Critical Care):  Procedures   ____________________________________________   INITIAL IMPRESSION / ASSESSMENT AND PLAN / ED COURSE  Damon [name] was evaluated in Emergency Department on 08/26/2018 for the symptoms described in the history of present illness. He was evaluated in the context of the global COVID-19 pandemic, which necessitated consideration that the patient might be at risk for infection with the SARS-CoV-2 virus that causes COVID-19. Institutional protocols and algorithms that pertain to the evaluation of patients at risk for COVID-19 are in a state of rapid change based on information released by regulatory bodies including the CDC and federal and state organizations. These policies and algorithms were followed during the patient's care in the ED.    Patient is a well-appearing 85 year old who presents with leg swelling and abdominal distention.  Will get CT abdomen to rule out SBO, AAA, incarcerated hernia, diverticulitis.  Is most likely secondary to some increased fluid overload.  Will get chest x-ray to evaluate for pleural effusion.  Patient is  satting 96% on room air.  He has no evidence of infection.  Urine was ordered by triage however he was taken off his suprapubic catheter should he felt that that is infected mostly just colonization.    Clinical Course as of Aug 27 3  Wed Aug 27, 2018  0000 Creatinine(!): 1.53 [MF]    Clinical Course User Index [MF] Vanessa Broadwell, MD    Labs notable for creatinine of 1.53.  Coronavirus testing was negative.  proBNP was lower than baseline at 232.  Chest x-ray was negative for pleural effusions.  Discussed with family increasing his Lasix dose to 40 mg twice daily and to follow-up with his primary care doctor in the next 2 to 3 days.  If symptoms are worsening they can return to the ER.   ____________________________________________   FINAL CLINICAL IMPRESSION(S) / ED DIAGNOSES   Final diagnoses:  Edema, unspecified type      MEDICATIONS GIVEN DURING THIS VISIT:  Medications  sodium chloride flush (NS) 0.9 % injection 3 mL (has no administration in time range)     ED Discharge Orders    None       Note:  This document was prepared using Dragon voice recognition software and may include unintentional dictation errors.   Vanessa [place], MD 08/27/18 [phone]

## 2018-08-26 NOTE — ED Triage Notes (Signed)
abd pain, swellingin abd and lower extremities, SOB, hx of dementia, daughter with pt.

## 2018-08-27 NOTE — Discharge Instructions (Addendum)
Increase your Lasix dose to twice daily for the next 3 days.  This will be taking 40 mg twice daily.  Follow-up with your primary care doctor in 2 to 3 days for lab recheck, may need repeat echo. Return to the ER for worsening shortness of breath, swelling or any other concerns.

## 2018-08-29 DIAGNOSIS — N183 Chronic kidney disease, stage 3 (moderate): Secondary | ICD-10-CM | POA: Diagnosis not present

## 2018-08-29 DIAGNOSIS — Z794 Long term (current) use of insulin: Secondary | ICD-10-CM | POA: Diagnosis not present

## 2018-08-29 DIAGNOSIS — I10 Essential (primary) hypertension: Secondary | ICD-10-CM | POA: Diagnosis not present

## 2018-08-29 DIAGNOSIS — E1122 Type 2 diabetes mellitus with diabetic chronic kidney disease: Secondary | ICD-10-CM | POA: Diagnosis not present

## 2018-08-29 DIAGNOSIS — I5030 Unspecified diastolic (congestive) heart failure: Secondary | ICD-10-CM | POA: Diagnosis not present

## 2018-08-31 ENCOUNTER — Other Ambulatory Visit: Payer: Self-pay | Admitting: Family Medicine

## 2018-09-04 DIAGNOSIS — R600 Localized edema: Secondary | ICD-10-CM | POA: Diagnosis not present

## 2018-09-04 DIAGNOSIS — N189 Chronic kidney disease, unspecified: Secondary | ICD-10-CM | POA: Diagnosis not present

## 2018-09-04 DIAGNOSIS — E1149 Type 2 diabetes mellitus with other diabetic neurological complication: Secondary | ICD-10-CM | POA: Diagnosis not present

## 2018-09-12 ENCOUNTER — Other Ambulatory Visit: Payer: Self-pay

## 2018-09-12 ENCOUNTER — Ambulatory Visit (INDEPENDENT_AMBULATORY_CARE_PROVIDER_SITE_OTHER): Payer: HMO | Admitting: *Deleted

## 2018-09-12 DIAGNOSIS — R339 Retention of urine, unspecified: Secondary | ICD-10-CM

## 2018-09-12 NOTE — Progress Notes (Signed)
Suprapubic Cath Change  Patient is present today for a suprapubic catheter change due to urinary retention.  32ml of water was drained from the balloon, a 16FR foley cath was removed from the tract with out difficulty.  Site was cleaned and prepped in a sterile fashion with betadine.  A 16FR foley cath was replaced into the tract no complications were noted. Urine return was noted, 10 ml of sterile water was inflated into the balloon and an overnight bag was attached for drainage.  Patient tolerated well. A night bag was given to patient and proper instruction was given on how to switch bags.    Patient suprapubic tract site noted to have increased in size, daughter was made aware to monitor for pulling of the catheter at the site.   Preformed by: Blondell Reveal  Follow up: One month

## 2018-09-24 DIAGNOSIS — H401131 Primary open-angle glaucoma, bilateral, mild stage: Secondary | ICD-10-CM | POA: Diagnosis not present

## 2018-10-05 DIAGNOSIS — E1149 Type 2 diabetes mellitus with other diabetic neurological complication: Secondary | ICD-10-CM | POA: Diagnosis not present

## 2018-10-05 DIAGNOSIS — R600 Localized edema: Secondary | ICD-10-CM | POA: Diagnosis not present

## 2018-10-05 DIAGNOSIS — N189 Chronic kidney disease, unspecified: Secondary | ICD-10-CM | POA: Diagnosis not present

## 2018-10-15 DIAGNOSIS — L851 Acquired keratosis [keratoderma] palmaris et plantaris: Secondary | ICD-10-CM | POA: Diagnosis not present

## 2018-10-15 DIAGNOSIS — E1140 Type 2 diabetes mellitus with diabetic neuropathy, unspecified: Secondary | ICD-10-CM | POA: Diagnosis not present

## 2018-10-15 DIAGNOSIS — B351 Tinea unguium: Secondary | ICD-10-CM | POA: Diagnosis not present

## 2018-10-15 DIAGNOSIS — Z794 Long term (current) use of insulin: Secondary | ICD-10-CM | POA: Diagnosis not present

## 2018-10-20 ENCOUNTER — Other Ambulatory Visit: Payer: Self-pay

## 2018-10-20 ENCOUNTER — Ambulatory Visit (INDEPENDENT_AMBULATORY_CARE_PROVIDER_SITE_OTHER): Payer: HMO

## 2018-10-20 DIAGNOSIS — R339 Retention of urine, unspecified: Secondary | ICD-10-CM | POA: Diagnosis not present

## 2018-10-20 DIAGNOSIS — E113293 Type 2 diabetes mellitus with mild nonproliferative diabetic retinopathy without macular edema, bilateral: Secondary | ICD-10-CM | POA: Diagnosis not present

## 2018-10-20 DIAGNOSIS — E1165 Type 2 diabetes mellitus with hyperglycemia: Secondary | ICD-10-CM | POA: Diagnosis not present

## 2018-10-20 DIAGNOSIS — Z794 Long term (current) use of insulin: Secondary | ICD-10-CM | POA: Diagnosis not present

## 2018-10-20 DIAGNOSIS — E039 Hypothyroidism, unspecified: Secondary | ICD-10-CM | POA: Diagnosis not present

## 2018-10-20 NOTE — Progress Notes (Signed)
Suprapubic Cath Change  Patient is present today for a suprapubic catheter change due to urinary retention.  89ml of water was drained from the balloon, a 16FR foley cath was removed from the tract with out difficulty.  Site was cleaned and prepped in a sterile fashion with betadine.  A 16FR foley cath was replaced into the tract no complications were noted. Patient was concerned about bleeding, instructed daughter on how to apply guaze to the site to help prevent irritation. Urine return was noted, 10 ml of sterile water was inflated into the balloon and a night bag was attached for drainage. Patient tolerated well.  Preformed by: Gordy Clement, CMA   Follow up: RTC in 1 month for cath change

## 2018-10-27 DIAGNOSIS — E669 Obesity, unspecified: Secondary | ICD-10-CM | POA: Diagnosis not present

## 2018-10-27 DIAGNOSIS — Z794 Long term (current) use of insulin: Secondary | ICD-10-CM | POA: Diagnosis not present

## 2018-10-27 DIAGNOSIS — E113293 Type 2 diabetes mellitus with mild nonproliferative diabetic retinopathy without macular edema, bilateral: Secondary | ICD-10-CM | POA: Diagnosis not present

## 2018-10-27 DIAGNOSIS — E1165 Type 2 diabetes mellitus with hyperglycemia: Secondary | ICD-10-CM | POA: Diagnosis not present

## 2018-10-27 DIAGNOSIS — N183 Chronic kidney disease, stage 3 unspecified: Secondary | ICD-10-CM | POA: Diagnosis not present

## 2018-10-27 DIAGNOSIS — M810 Age-related osteoporosis without current pathological fracture: Secondary | ICD-10-CM | POA: Diagnosis not present

## 2018-10-27 DIAGNOSIS — N1832 Chronic kidney disease, stage 3b: Secondary | ICD-10-CM | POA: Diagnosis not present

## 2018-10-27 DIAGNOSIS — E1142 Type 2 diabetes mellitus with diabetic polyneuropathy: Secondary | ICD-10-CM | POA: Diagnosis not present

## 2018-10-27 DIAGNOSIS — E1121 Type 2 diabetes mellitus with diabetic nephropathy: Secondary | ICD-10-CM | POA: Diagnosis not present

## 2018-10-27 DIAGNOSIS — I10 Essential (primary) hypertension: Secondary | ICD-10-CM | POA: Diagnosis not present

## 2018-10-27 DIAGNOSIS — E1159 Type 2 diabetes mellitus with other circulatory complications: Secondary | ICD-10-CM | POA: Diagnosis not present

## 2018-10-27 DIAGNOSIS — E1169 Type 2 diabetes mellitus with other specified complication: Secondary | ICD-10-CM | POA: Diagnosis not present

## 2018-10-27 DIAGNOSIS — E7849 Other hyperlipidemia: Secondary | ICD-10-CM | POA: Diagnosis not present

## 2018-10-27 DIAGNOSIS — Z6838 Body mass index (BMI) 38.0-38.9, adult: Secondary | ICD-10-CM | POA: Diagnosis not present

## 2018-11-04 ENCOUNTER — Other Ambulatory Visit: Payer: Self-pay

## 2018-11-04 NOTE — Patient Outreach (Signed)
  [facility] [facility]) Care Management Chronic Special Needs Program    11/04/2018  Name: Damon [name], DOB: 1934/01/19  MRN: [id number]   Mr. [name] is enrolled in a chronic special needs plan. RNCM received notification from [employer] home visit re: social needs- Reports: low income; need for assistance at home and no other household member able to render care; unable to access medical care; needs help with respite care; request information on support groups-Cancer. Request per [employer] to call client's daughter(caregiver) to assess for financial assistance. Member with dementia and daughter was laid off from her job. [name]'s husband also has health needs. Client's caregiver is not able to go to her medical appointments due to no insurance and no money. Client's son in law with health insurance.  Client lives with his daughter, [name] who is client's primary caregiver. Client is hard of hearing.   Plan: social work referral. RNCM will continue to follow.  [name], RN, MSN, [place] [phone]

## 2018-11-05 ENCOUNTER — Other Ambulatory Visit: Payer: Self-pay

## 2018-11-05 NOTE — Patient Outreach (Addendum)
Damon [name]) Care Management  11/05/2018  Damon [name] [birth year] [id number]   Social work referral from [employer], [name].  "RNCM received notification from [place] home visit re: social needs- Reports: low income; need for assistance at home and no other household member able to render care; unable to access medical care; needs help with respite care; request information on support groups-Cancer. Request per [employer] to call client's daughter(caregiver) to assess for financial assistance. Member with dementia and daughter was laid off from her job. [name]'s husband also has health needs. Client's caregiver is not able to go to her medical appointments due to no insurance and no money. Client's son in law with health insurance. Client lives with his daughter, Damon [name] who is client's primary caregiver. Client is hard of hearing". Unsuccessful outreach today.  Left voicemail message and mailed Unsuccessful Outreach Letter. Will attempt to reach again within four business days.   Addendum: Received return call from daughter.  Introduced self and reason for call.  Daughter stated that primary concern is locating in-home support for patient.   Daughter reports that she is unable to work due to having to care for patient.  Daughter stated "I'm not putting him in a nursing home.  I will take care of him until I can't anymore."  Informed daughter that, unfortunately, Medicare does not cover personal care services.  Discussed applying for Medicaid, however, patient is over the income limit to qualify.  Did encourage daughter to apply due to being unemployed.  Offered to send list of in-home providers for private hire but daughter states that patient cannot afford this.  Daughter was under the impression that she could be compensated for providing care for patient but explained that he must have Medicaid for this to be a possibility.  Informed daughter of In-Home Aide  Program through [facility] but she declined a referral being submitted due to wait list being approximately 3 years long.  Informed daughter about Respite Program through [employer] [facility] and provided her with contact information for [employer] so that she may further inquire about eligibility.     Ronn Melena, BSW Social Worker [phone]

## 2018-11-06 ENCOUNTER — Other Ambulatory Visit: Payer: Self-pay

## 2018-11-06 NOTE — Patient Outreach (Addendum)
[facility]) Care Management Chronic Special Needs Program  11/06/2018  Name: Damon [name] DOB: [birth year]  MRN: [id number]  Damon [name] is enrolled in a chronic special needs plan for Diabetes. Reviewed and updated care plan.  Subjective: RNCM spoke with client's daughter, Damon [name] (primary caregiver). She reports that client has been to all scheduled provider appointments. She states client is doing well. She states he was recently started on ozempic weekly and continues to take 70/30 insulin. She reports this has made a difference in client's blood sugars. She states this morning his blood sugar was 109. She states that she continues to weigh client daily and his weights range between 224-226 pounds.   Damon [name] states she spoke with [facility] social worker on yesterday. She states she will continue to take care of client at home. RNCM discussed family support and Damon [name] states that siblings are not supportive in that they do not physically come to assist her with the care of Damon [name]. Damon [name] shares that she is unemployed at this time and they recently found out her husband has cancer and is beginning treatment.   Goals Addressed            This Visit's Progress   . COMPLETED: Client understands the importance of follow-up with providers by attending scheduled visits       Voiced understanding of importance of provider visits.    . COMPLETED: Client verbalize knowledge of Heart Failure disease self management skills within the next 6 months.       Voiced taking medications, monitoring weights, attending provider visits, knowing when to call the doctor.    . Client will report no fall or injuries in the next within the next 4-5 months-goal modified 11/06/2018 (pt-stated)   On track   . COMPLETED: Client will report no worsening of symptoms related to heart disease within the next 6 months        Denies any symptoms.    . COMPLETED: Client will use  Assistive Devices as needed and verbalize understanding of device use       Denies any issues with use of glucometer.    . COMPLETED: Client will verbalize knowledge of self management of Hypertension as evidences by BP reading of 140/90 or less; or as defined by provider       Voiced taking medications, attending provider visits, blood pressure less than 140/90.    Marland Kitchen COMPLETED: Maintain timely refills of diabetic medication as prescribed within the year .       Denies any issues.    . COMPLETED: Obtain annual  Lipid Profile, LDL-C       Done 07/24/2018    . COMPLETED: Obtain Annual Eye (retinal)  Exam        Reports done 09/2018    . COMPLETED: Obtain Annual Foot Exam       Done 05/15/2018    . COMPLETED: Obtain Hemoglobin A1C at least 2 times per year       Done 04/04/2018; 07/18/2018; 10/20/2018    . COMPLETED: Visit Primary Care Provider or Endocrinologist at least 2 times per year        Has seen provider at least 2 times/year      RNCM allowed time for Damon [name] to discuss situation going on with client and in her family; RNCM discussed possibility of siblings pooling some funds together to hire someone to come in a few hours/week to assist client. Reviewed signs/symptoms  of heart failure exacerbation and when to call the doctor. Encouraged continue follow up with providers as scheduled and as needed.  Covid precautions discussed. Encouraged to call 24 hour nurse advice line as needed; reinforced the availability of the health care concierge and encouraged to call as needed.  Plan: RNCM will send updated care plan to client; send updated care plan to primary care. RNCM will discuss with primary care regarding if Hospice would be appropriate resource for client. Chronic care management coordinator will outreach within the next 4-5 months.    Damon Silversmith, RN, MSN, [place] [phone]   .   Marland Kitchen

## 2018-11-10 DIAGNOSIS — E1122 Type 2 diabetes mellitus with diabetic chronic kidney disease: Secondary | ICD-10-CM | POA: Diagnosis not present

## 2018-11-10 DIAGNOSIS — N189 Chronic kidney disease, unspecified: Secondary | ICD-10-CM | POA: Diagnosis not present

## 2018-11-10 DIAGNOSIS — I129 Hypertensive chronic kidney disease with stage 1 through stage 4 chronic kidney disease, or unspecified chronic kidney disease: Secondary | ICD-10-CM | POA: Diagnosis not present

## 2018-11-10 DIAGNOSIS — R338 Other retention of urine: Secondary | ICD-10-CM | POA: Diagnosis not present

## 2018-11-10 DIAGNOSIS — N1832 Chronic kidney disease, stage 3b: Secondary | ICD-10-CM | POA: Diagnosis not present

## 2018-11-11 ENCOUNTER — Other Ambulatory Visit: Payer: Self-pay | Admitting: Internal Medicine

## 2018-11-11 ENCOUNTER — Ambulatory Visit: Payer: HMO

## 2018-11-11 DIAGNOSIS — R410 Disorientation, unspecified: Secondary | ICD-10-CM

## 2018-11-11 DIAGNOSIS — E538 Deficiency of other specified B group vitamins: Secondary | ICD-10-CM | POA: Diagnosis not present

## 2018-11-11 DIAGNOSIS — Z23 Encounter for immunization: Secondary | ICD-10-CM | POA: Diagnosis not present

## 2018-11-14 DIAGNOSIS — E538 Deficiency of other specified B group vitamins: Secondary | ICD-10-CM | POA: Diagnosis not present

## 2018-11-20 ENCOUNTER — Other Ambulatory Visit: Payer: Self-pay

## 2018-11-20 ENCOUNTER — Ambulatory Visit
Admission: RE | Admit: 2018-11-20 | Discharge: 2018-11-20 | Disposition: A | Discharge status: Home / Self Care | Payer: HMO | Source: Ambulatory Visit | Attending: Internal Medicine | Admitting: Internal Medicine

## 2018-11-20 DIAGNOSIS — G3189 Other specified degenerative diseases of nervous system: Secondary | ICD-10-CM | POA: Diagnosis not present

## 2018-11-20 DIAGNOSIS — R410 Disorientation, unspecified: Secondary | ICD-10-CM

## 2018-11-20 DIAGNOSIS — I6782 Cerebral ischemia: Secondary | ICD-10-CM | POA: Diagnosis not present

## 2018-11-20 DIAGNOSIS — E538 Deficiency of other specified B group vitamins: Secondary | ICD-10-CM | POA: Diagnosis not present

## 2018-11-24 ENCOUNTER — Other Ambulatory Visit: Payer: Self-pay

## 2018-11-24 ENCOUNTER — Encounter: Payer: Self-pay | Admitting: Physician Assistant

## 2018-11-24 ENCOUNTER — Ambulatory Visit (INDEPENDENT_AMBULATORY_CARE_PROVIDER_SITE_OTHER): Payer: HMO | Admitting: Physician Assistant

## 2018-11-24 VITALS — BP 130/71 | HR 63 | Ht 65.0 in | Wt 220.0 lb

## 2018-11-24 DIAGNOSIS — Z9359 Other cystostomy status: Secondary | ICD-10-CM

## 2018-11-24 DIAGNOSIS — B372 Candidiasis of skin and nail: Secondary | ICD-10-CM

## 2018-11-24 DIAGNOSIS — I700 Atherosclerosis of aorta: Secondary | ICD-10-CM | POA: Insufficient documentation

## 2018-11-24 MED ORDER — NYSTATIN-TRIAMCINOLONE 100000-0.1 UNIT/GM-% EX OINT: 1.0000 "application " | TOPICAL_OINTMENT | Freq: Two times a day (BID) | CUTANEOUS | 2 refills | Status: DC

## 2018-11-24 NOTE — Progress Notes (Signed)
Suprapubic Cath Change  Patient is present today for a suprapubic catheter change due to urinary retention.  15ml of water was drained from the balloon, a 16FR foley cath was removed from the tract with out difficulty.  Site was cleaned and prepped in a sterile fashion with betadine.  A 16FR foley cath was replaced into the tract no complications were noted. Urine return was noted, 10 ml of sterile water was inflated into the balloon and a night bag was attached for drainage. Patient tolerated well.  Performed by: Gordy Clement, CMA (AAMA)  Follow up: Return in about 1 month (around 12/24/2018) for SPT Change, PA Schedule .   Additional notes: Patient presented with a 2-week history of redness along the left inguinal crease. He denies pain and pruritis at the site. Daughter reports applying antibiotic ointment with no improvement. On physical exam, clearly defined beefy red inguinal crease with satellite lesions. Prescribed nystatin ointment for use BID until symptoms resolve. Debroah Loop, PA-C

## 2018-12-05 DIAGNOSIS — E1149 Type 2 diabetes mellitus with other diabetic neurological complication: Secondary | ICD-10-CM | POA: Diagnosis not present

## 2018-12-05 DIAGNOSIS — R600 Localized edema: Secondary | ICD-10-CM | POA: Diagnosis not present

## 2018-12-05 DIAGNOSIS — N189 Chronic kidney disease, unspecified: Secondary | ICD-10-CM | POA: Diagnosis not present

## 2018-12-10 ENCOUNTER — Encounter: Payer: Self-pay | Admitting: Urology

## 2018-12-15 ENCOUNTER — Other Ambulatory Visit: Payer: Self-pay

## 2018-12-15 NOTE — Patient Outreach (Signed)
  [facility]) Care Management Chronic Special Needs Program    12/15/2018  Name: Damon [name], DOB: 1933/01/24  MRN: [id number]   [name] is enrolled in a chronic special needs plan. Daughter primary care giver. Limited resources to assist with care in the home. [place] work discussed with client last month. RNCM called to follow up regarding resource needs.  No answer. HIPAA compliant message left.   RNCM also followed up with primary care office-left message for primary care provider re: primary care thought's on client/family benefit from hospice/palliative care services. RNCM will await return call.  Plan: continue to follow.  [name], RN, MSN, [place] [phone]

## 2018-12-22 DIAGNOSIS — F0280 Dementia in other diseases classified elsewhere without behavioral disturbance: Secondary | ICD-10-CM | POA: Diagnosis not present

## 2018-12-22 DIAGNOSIS — F0150 Vascular dementia without behavioral disturbance: Secondary | ICD-10-CM | POA: Diagnosis not present

## 2018-12-22 DIAGNOSIS — G309 Alzheimer's disease, unspecified: Secondary | ICD-10-CM | POA: Diagnosis not present

## 2018-12-22 DIAGNOSIS — E519 Thiamine deficiency, unspecified: Secondary | ICD-10-CM | POA: Diagnosis not present

## 2018-12-23 ENCOUNTER — Ambulatory Visit (INDEPENDENT_AMBULATORY_CARE_PROVIDER_SITE_OTHER): Payer: HMO | Admitting: Physician Assistant

## 2018-12-23 ENCOUNTER — Other Ambulatory Visit: Payer: Self-pay

## 2018-12-23 ENCOUNTER — Encounter: Payer: Self-pay | Admitting: Physician Assistant

## 2018-12-23 VITALS — BP 109/67 | HR 76 | Ht 68.0 in | Wt 220.0 lb

## 2018-12-23 DIAGNOSIS — Z9359 Other cystostomy status: Secondary | ICD-10-CM

## 2018-12-23 DIAGNOSIS — B372 Candidiasis of skin and nail: Secondary | ICD-10-CM

## 2018-12-23 NOTE — Progress Notes (Signed)
Suprapubic Cath Change  Patient is present today for a suprapubic catheter change due to urinary retention.  19ml of water was drained from the balloon, a 16FR foley cath was removed from the tract with out difficulty.  Site was cleaned and prepped in a sterile fashion with betadine.  A 16FR foley cath was replaced into the tract no complications were noted. Urine return was noted, 10 ml of sterile water was inflated into the balloon and a night bag with extender tubing was attached for drainage.  Patient tolerated well. Tubing was secured to his left leg with a StatLock; patient declined a leg bag.  Performed by: Debroah Loop, PA-C   Additional notes: Patient continues to have asymptomatic erythema of the bilateral inguinal creases. Daughter reports she has been using nystatin ointment topically for this and he also received 1 dose of Diflucan 150mg  approximately 1 week ago, prescribed by his PCP. On physical exam, there is some superficial skin breakdown with visualized satellite lesions present; intertriginous regions moist throughout. I suspect combined yeast (treated) and moisture etiology for this presentation. Recommended patient to switch from nystatin ointment to OTC moisture absorbing antifungal powder for continued management of this; will reevaluate in one month. If symptoms persist at that time, will consider referral to dermatology or wound care.  Follow up: Return in about 4 weeks (around 01/20/2019) for SPT exchange.  I spent 10 min with this patient, of which greater than 50% was spent in counseling and coordination of care with the patient.   Debroah Loop, PA-C  12/23/18 10:43 AM

## 2018-12-29 ENCOUNTER — Other Ambulatory Visit: Payer: Self-pay

## 2018-12-29 NOTE — Patient Outreach (Signed)
  [facility]) Care Management Chronic Special Needs Program  12/29/2018  Name: Damon [name] DOB: 04-27-1933  MRN: [id number]  Mr. Damon [name] is enrolled in a chronic special needs plan for Diabetes. Reviewed and updated care plan.  Subjective: RNCM spoke with Damon [name](daughter/caregiver), two patient identifiers confirmed. She reprots client is doing well. Mrs. [name] manages clients medications and she states she is able to get medications for client. Mrs. [name] takes client to his provider visits. She reports blood sugar this morning was 91. Client has seen neurologist and was started on memantine. She reports upcoming visit with primary care and endocrinologist on January 4th. Per chart-Last A1C 10/20/2018 was 8.4 improved from 8.7 in June 2020. Daughter continues to assist client in overall management of health/care. Daughter reports she is managing, but limited support for her in the home.    Goals Addressed            This Visit's Progress   . COMPLETED: Client will report no fall or injuries in the next within the next 4-5 months-goal modified 11/06/2018 (pt-stated)       Denies any falls or injuries since 11/06/2018    . COMPLETED: Increase water intake       Per daughter, has increased water intake.    . Obtain annual screen for micro albuminuria (urine) , nephropathy (kidney problems)   On track    Per Mrs. [name], unsure if this has been done.      RNCM reviewed community resources: discussed [employer] education careers [employer] [facility] of [employer], [employer]. [name] reports client does not qualify for Medicaid, palliative care for quality of life support; and hospice potential client/caregiver support, however RNCM reinforced unclear if client is eligible for those services and recommended she discuss with primary care at next office visit.   Covid 19 precautions discussed, encouraged to call 24 hour nurse advice line as needed. Also reinforced the availability of  the health care concierge(contact number provided. RNCM encouraged Ms. Creggar to call RNCM as needed.  Plan: RNCM will send updated care plan to client, send updated careplan to primary care. RNCM will outreach within the next 6 months per tier level.  [name], RN, MSN, [place] [phone]

## 2019-01-04 DIAGNOSIS — N189 Chronic kidney disease, unspecified: Secondary | ICD-10-CM | POA: Diagnosis not present

## 2019-01-04 DIAGNOSIS — R600 Localized edema: Secondary | ICD-10-CM | POA: Diagnosis not present

## 2019-01-04 DIAGNOSIS — E1149 Type 2 diabetes mellitus with other diabetic neurological complication: Secondary | ICD-10-CM | POA: Diagnosis not present

## 2019-01-13 DIAGNOSIS — H10413 Chronic giant papillary conjunctivitis, bilateral: Secondary | ICD-10-CM | POA: Diagnosis not present

## 2019-01-20 ENCOUNTER — Encounter: Payer: Self-pay | Admitting: Physician Assistant

## 2019-01-20 ENCOUNTER — Ambulatory Visit: Payer: HMO | Admitting: Physician Assistant

## 2019-01-20 ENCOUNTER — Telehealth: Payer: Self-pay | Admitting: Physician Assistant

## 2019-01-20 ENCOUNTER — Ambulatory Visit
Admission: RE | Admit: 2019-01-20 | Discharge: 2019-01-20 | Disposition: A | Discharge status: Home / Self Care | Payer: HMO | Source: Ambulatory Visit | Attending: Physician Assistant | Admitting: Physician Assistant

## 2019-01-20 ENCOUNTER — Other Ambulatory Visit: Payer: Self-pay

## 2019-01-20 ENCOUNTER — Ambulatory Visit (INDEPENDENT_AMBULATORY_CARE_PROVIDER_SITE_OTHER): Payer: HMO | Admitting: Physician Assistant

## 2019-01-20 VITALS — BP 93/56 | HR 67 | Ht 68.0 in | Wt 220.0 lb

## 2019-01-20 DIAGNOSIS — R140 Abdominal distension (gaseous): Secondary | ICD-10-CM

## 2019-01-20 DIAGNOSIS — B372 Candidiasis of skin and nail: Secondary | ICD-10-CM

## 2019-01-20 DIAGNOSIS — Z9359 Other cystostomy status: Secondary | ICD-10-CM | POA: Diagnosis not present

## 2019-01-20 NOTE — Telephone Encounter (Signed)
I just spoke with the patient's daughter, Levada Dy, to report the results of his abdominal x-ray.  I explained that his x-ray was normal and significant only for scattered bowel gas.  I cannot find an obvious cause for his abdominal distention and other symptoms.  I encouraged her to follow-up with his PCP for further evaluation.  She stated she has an appointment with Dr. Edwina Barth early next week.

## 2019-01-20 NOTE — Progress Notes (Signed)
Suprapubic Cath Change  Patient is present today for a suprapubic catheter change due to urinary retention.  14ml of water was drained from the balloon, a 16FR foley cath was removed from the tract with out difficulty.  Site was cleaned and prepped in a sterile fashion with betadine.  A 16FR foley cath was replaced into the tract no complications were noted. Urine return was noted, 10 ml of sterile water was inflated into the balloon and a night bag with extender tubing was attached for drainage.  Tubing was secured to his left leg with a StatLock. Patient tolerated well. Patient declined an additional night bag.    Performed by: Debroah Loop, PA-C   Additional notes: Significant improvement of intertriginous dermatitis on physical exam today. Counseled daughter to continue antifungal powder until erythema resolves completely.   Patient hypotensive today to 93/56. Patient's daughter reports decreased fluid intake over the past two days, associated with abdominal distention, shortness of breath with exertion, and unsteady gait. He denies chest pain, abdominal pain, palpitations, melena, hematochezia, and gross hematuria. Patient's daughter reports he has regular bowel movements, no change in recent medications. Physical exam notable for conjunctival injection, and nontender abdominal distention without fluid wave. Ordered KUB today and counseled patient and daughter to contact his PCP for further evaluation. They expressed understanding.  Return in about 4 weeks (around 02/17/2019) for SPT exchange.   I spent 15 min with this patient, of which greater than 50% was spent in counseling and coordination of care with the patient.   Debroah Loop, PA-C 01/20/19 10:11 AM

## 2019-01-26 DIAGNOSIS — R339 Retention of urine, unspecified: Secondary | ICD-10-CM | POA: Diagnosis not present

## 2019-01-26 DIAGNOSIS — Z0000 Encounter for general adult medical examination without abnormal findings: Secondary | ICD-10-CM | POA: Diagnosis not present

## 2019-01-26 DIAGNOSIS — G309 Alzheimer's disease, unspecified: Secondary | ICD-10-CM | POA: Diagnosis not present

## 2019-01-26 DIAGNOSIS — N183 Chronic kidney disease, stage 3 unspecified: Secondary | ICD-10-CM | POA: Diagnosis not present

## 2019-01-26 DIAGNOSIS — E1121 Type 2 diabetes mellitus with diabetic nephropathy: Secondary | ICD-10-CM | POA: Diagnosis not present

## 2019-01-26 DIAGNOSIS — F0280 Dementia in other diseases classified elsewhere without behavioral disturbance: Secondary | ICD-10-CM | POA: Diagnosis not present

## 2019-01-26 DIAGNOSIS — N1832 Chronic kidney disease, stage 3b: Secondary | ICD-10-CM | POA: Diagnosis not present

## 2019-01-26 DIAGNOSIS — R0602 Shortness of breath: Secondary | ICD-10-CM | POA: Diagnosis not present

## 2019-01-26 DIAGNOSIS — Z794 Long term (current) use of insulin: Secondary | ICD-10-CM | POA: Diagnosis not present

## 2019-01-26 DIAGNOSIS — F0150 Vascular dementia without behavioral disturbance: Secondary | ICD-10-CM | POA: Diagnosis not present

## 2019-01-30 DIAGNOSIS — E669 Obesity, unspecified: Secondary | ICD-10-CM | POA: Diagnosis not present

## 2019-01-30 DIAGNOSIS — E1159 Type 2 diabetes mellitus with other circulatory complications: Secondary | ICD-10-CM | POA: Diagnosis not present

## 2019-01-30 DIAGNOSIS — F0390 Unspecified dementia without behavioral disturbance: Secondary | ICD-10-CM | POA: Diagnosis not present

## 2019-01-30 DIAGNOSIS — I5030 Unspecified diastolic (congestive) heart failure: Secondary | ICD-10-CM | POA: Diagnosis not present

## 2019-01-30 DIAGNOSIS — J90 Pleural effusion, not elsewhere classified: Secondary | ICD-10-CM | POA: Diagnosis not present

## 2019-01-30 DIAGNOSIS — E1169 Type 2 diabetes mellitus with other specified complication: Secondary | ICD-10-CM | POA: Diagnosis not present

## 2019-01-30 DIAGNOSIS — R0602 Shortness of breath: Secondary | ICD-10-CM | POA: Diagnosis not present

## 2019-01-30 DIAGNOSIS — E113293 Type 2 diabetes mellitus with mild nonproliferative diabetic retinopathy without macular edema, bilateral: Secondary | ICD-10-CM | POA: Diagnosis not present

## 2019-01-30 DIAGNOSIS — E1121 Type 2 diabetes mellitus with diabetic nephropathy: Secondary | ICD-10-CM | POA: Diagnosis not present

## 2019-01-30 DIAGNOSIS — N1832 Chronic kidney disease, stage 3b: Secondary | ICD-10-CM | POA: Diagnosis not present

## 2019-01-30 DIAGNOSIS — E1142 Type 2 diabetes mellitus with diabetic polyneuropathy: Secondary | ICD-10-CM | POA: Diagnosis not present

## 2019-01-30 DIAGNOSIS — J189 Pneumonia, unspecified organism: Secondary | ICD-10-CM | POA: Diagnosis not present

## 2019-01-30 DIAGNOSIS — Z794 Long term (current) use of insulin: Secondary | ICD-10-CM | POA: Diagnosis not present

## 2019-01-30 DIAGNOSIS — E1165 Type 2 diabetes mellitus with hyperglycemia: Secondary | ICD-10-CM | POA: Diagnosis not present

## 2019-02-04 DIAGNOSIS — R600 Localized edema: Secondary | ICD-10-CM | POA: Diagnosis not present

## 2019-02-04 DIAGNOSIS — N189 Chronic kidney disease, unspecified: Secondary | ICD-10-CM | POA: Diagnosis not present

## 2019-02-04 DIAGNOSIS — E1149 Type 2 diabetes mellitus with other diabetic neurological complication: Secondary | ICD-10-CM | POA: Diagnosis not present

## 2019-02-05 DIAGNOSIS — L851 Acquired keratosis [keratoderma] palmaris et plantaris: Secondary | ICD-10-CM | POA: Diagnosis not present

## 2019-02-05 DIAGNOSIS — B351 Tinea unguium: Secondary | ICD-10-CM | POA: Diagnosis not present

## 2019-02-05 DIAGNOSIS — E1140 Type 2 diabetes mellitus with diabetic neuropathy, unspecified: Secondary | ICD-10-CM | POA: Diagnosis not present

## 2019-02-05 DIAGNOSIS — Z794 Long term (current) use of insulin: Secondary | ICD-10-CM | POA: Diagnosis not present

## 2019-02-17 ENCOUNTER — Other Ambulatory Visit: Payer: Self-pay

## 2019-02-17 ENCOUNTER — Encounter: Payer: Self-pay | Admitting: Physician Assistant

## 2019-02-17 ENCOUNTER — Ambulatory Visit (INDEPENDENT_AMBULATORY_CARE_PROVIDER_SITE_OTHER): Payer: HMO | Admitting: Physician Assistant

## 2019-02-17 VITALS — BP 140/80 | HR 74 | Ht 66.0 in | Wt 220.0 lb

## 2019-02-17 DIAGNOSIS — Z9359 Other cystostomy status: Secondary | ICD-10-CM | POA: Diagnosis not present

## 2019-02-17 NOTE — Progress Notes (Signed)
Suprapubic Cath Change  Patient is present today for a suprapubic catheter change due to urinary retention.  58ml of water was drained from the balloon, a 16FR foley cath was removed from the tract without difficulty.  Site was cleaned and prepped in a sterile fashion with betadine.  A 16FR foley cath was replaced into the tract no complications were noted. Urine return was noted, 10 ml of sterile water was inflated into the balloon and extension tubing attached to a night bag was attached for drainage.  Catheter secured to his anterior left thigh with a StatLock and an additional StatLock provided. Patient tolerated well.    Performed by: Debroah Loop, PA-C and Gaspar Cola, CMA  Follow up: Return in about 4 weeks (around 03/17/2019) for SPT exchange.

## 2019-03-07 DIAGNOSIS — E1149 Type 2 diabetes mellitus with other diabetic neurological complication: Secondary | ICD-10-CM | POA: Diagnosis not present

## 2019-03-07 DIAGNOSIS — R600 Localized edema: Secondary | ICD-10-CM | POA: Diagnosis not present

## 2019-03-07 DIAGNOSIS — N189 Chronic kidney disease, unspecified: Secondary | ICD-10-CM | POA: Diagnosis not present

## 2019-03-18 ENCOUNTER — Other Ambulatory Visit: Payer: Self-pay

## 2019-03-18 ENCOUNTER — Ambulatory Visit (INDEPENDENT_AMBULATORY_CARE_PROVIDER_SITE_OTHER): Payer: HMO | Admitting: Physician Assistant

## 2019-03-18 DIAGNOSIS — Z9359 Other cystostomy status: Secondary | ICD-10-CM | POA: Diagnosis not present

## 2019-03-18 NOTE — Progress Notes (Signed)
Suprapubic Cath Change  Patient is present today for a suprapubic catheter change due to urinary retention.  54ml of water was drained from the balloon, a 16FR foley cath was removed from the tract with out difficulty.  Site was cleaned and prepped in a sterile fashion with betadine.  A 16FR foley cath was replaced into the tract no complications were noted. Urine return was noted, 10 ml of sterile water was inflated into the balloon and a night bag with extension tubing was attached for drainage and tubing with attached to his left anterior thigh with a StatLock.  Patient tolerated well. A StatLock was given to patient.    Performed by: Debroah Loop, PA-C   Follow up: Return in about 4 weeks (around 04/15/2019) for SPT exchange.

## 2019-03-27 DIAGNOSIS — E1159 Type 2 diabetes mellitus with other circulatory complications: Secondary | ICD-10-CM | POA: Diagnosis not present

## 2019-03-31 DIAGNOSIS — R202 Paresthesia of skin: Secondary | ICD-10-CM | POA: Diagnosis not present

## 2019-03-31 DIAGNOSIS — M5416 Radiculopathy, lumbar region: Secondary | ICD-10-CM | POA: Diagnosis not present

## 2019-03-31 DIAGNOSIS — M545 Low back pain: Secondary | ICD-10-CM | POA: Diagnosis not present

## 2019-04-02 ENCOUNTER — Ambulatory Visit
Admission: RE | Admit: 2019-04-02 | Discharge: 2019-04-02 | Disposition: A | Discharge status: Home / Self Care | Payer: HMO | Source: Ambulatory Visit | Attending: Physician Assistant | Admitting: Physician Assistant

## 2019-04-02 ENCOUNTER — Other Ambulatory Visit: Payer: Self-pay

## 2019-04-02 ENCOUNTER — Other Ambulatory Visit: Payer: Self-pay | Admitting: Physician Assistant

## 2019-04-02 ENCOUNTER — Other Ambulatory Visit (HOSPITAL_COMMUNITY): Payer: Self-pay | Admitting: Physician Assistant

## 2019-04-02 DIAGNOSIS — S32020A Wedge compression fracture of second lumbar vertebra, initial encounter for closed fracture: Secondary | ICD-10-CM

## 2019-04-03 DIAGNOSIS — S32020A Wedge compression fracture of second lumbar vertebra, initial encounter for closed fracture: Secondary | ICD-10-CM | POA: Diagnosis not present

## 2019-04-03 DIAGNOSIS — F0150 Vascular dementia without behavioral disturbance: Secondary | ICD-10-CM | POA: Diagnosis not present

## 2019-04-03 DIAGNOSIS — G309 Alzheimer's disease, unspecified: Secondary | ICD-10-CM | POA: Diagnosis not present

## 2019-04-03 DIAGNOSIS — L89151 Pressure ulcer of sacral region, stage 1: Secondary | ICD-10-CM | POA: Diagnosis not present

## 2019-04-03 DIAGNOSIS — F0280 Dementia in other diseases classified elsewhere without behavioral disturbance: Secondary | ICD-10-CM | POA: Diagnosis not present

## 2019-04-06 DIAGNOSIS — S32020A Wedge compression fracture of second lumbar vertebra, initial encounter for closed fracture: Secondary | ICD-10-CM | POA: Diagnosis not present

## 2019-04-08 DIAGNOSIS — H4052X3 Glaucoma secondary to other eye disorders, left eye, severe stage: Secondary | ICD-10-CM | POA: Diagnosis not present

## 2019-04-08 DIAGNOSIS — H26491 Other secondary cataract, right eye: Secondary | ICD-10-CM | POA: Diagnosis not present

## 2019-04-08 DIAGNOSIS — E113551 Type 2 diabetes mellitus with stable proliferative diabetic retinopathy, right eye: Secondary | ICD-10-CM | POA: Diagnosis not present

## 2019-04-08 DIAGNOSIS — E113552 Type 2 diabetes mellitus with stable proliferative diabetic retinopathy, left eye: Secondary | ICD-10-CM | POA: Diagnosis not present

## 2019-04-16 ENCOUNTER — Other Ambulatory Visit: Payer: Self-pay

## 2019-04-16 ENCOUNTER — Encounter: Payer: Self-pay | Admitting: Physician Assistant

## 2019-04-16 ENCOUNTER — Ambulatory Visit (INDEPENDENT_AMBULATORY_CARE_PROVIDER_SITE_OTHER): Payer: HMO | Admitting: Physician Assistant

## 2019-04-16 VITALS — BP 128/72 | HR 57 | Ht 66.0 in | Wt 228.0 lb

## 2019-04-16 DIAGNOSIS — R339 Retention of urine, unspecified: Secondary | ICD-10-CM

## 2019-04-16 NOTE — Progress Notes (Signed)
Suprapubic Cath Change  Patient is present today for a suprapubic catheter change due to urinary retention.  21ml of water was drained from the balloon, a 16FR foley cath was removed from the tract with out difficulty.  Site was cleaned and prepped in a sterile fashion with betadine.  A 16FR foley cath was replaced into the tract no complications were noted. Urine return was noted, 10 ml of sterile water was inflated into the balloon and a night bag was attached for drainage.  Patient tolerated well.  Preformed by: Bradly Bienenstock, CMA & Fonnie Jarvis, CMA  Follow up: 1 month

## 2019-05-08 DIAGNOSIS — N1832 Chronic kidney disease, stage 3b: Secondary | ICD-10-CM | POA: Diagnosis not present

## 2019-05-08 DIAGNOSIS — Z794 Long term (current) use of insulin: Secondary | ICD-10-CM | POA: Diagnosis not present

## 2019-05-08 DIAGNOSIS — E1121 Type 2 diabetes mellitus with diabetic nephropathy: Secondary | ICD-10-CM | POA: Diagnosis not present

## 2019-05-14 ENCOUNTER — Ambulatory Visit: Payer: HMO | Admitting: Physician Assistant

## 2019-05-14 ENCOUNTER — Other Ambulatory Visit: Payer: Self-pay

## 2019-05-14 ENCOUNTER — Encounter: Payer: Self-pay | Admitting: Physician Assistant

## 2019-05-14 VITALS — BP 101/64 | HR 65

## 2019-05-14 DIAGNOSIS — R339 Retention of urine, unspecified: Secondary | ICD-10-CM | POA: Diagnosis not present

## 2019-05-14 NOTE — Progress Notes (Signed)
Suprapubic Cath Change  Patient is present today for a suprapubic catheter change due to urinary retention.  10ml of water was drained from the balloon, a 16FR foley cath was removed from the tract without difficulty.  Site was cleaned and prepped in a sterile fashion with betadine.  A 16FR foley cath was replaced into the tract no complications were noted. Urine return was noted, 10 ml of sterile water was inflated into the balloon and a night bag with extension tubing was attached for drainage.  Catheter was secured to his anterior left thigh with a StatLock. Patient tolerated well. Patient declined additional drainage bags.   Preformed by: Debroah Loop, PA-C   Follow up: Return in about 4 weeks (around 06/11/2019) for SPT exchange.

## 2019-05-15 ENCOUNTER — Emergency Department
Admission: EM | Admit: 2019-05-15 | Discharge: 2019-05-15 | Disposition: A | Discharge status: Home / Self Care | Payer: HMO | Attending: Emergency Medicine | Admitting: Emergency Medicine

## 2019-05-15 ENCOUNTER — Other Ambulatory Visit: Payer: Self-pay

## 2019-05-15 ENCOUNTER — Emergency Department: Payer: HMO

## 2019-05-15 ENCOUNTER — Encounter: Payer: Self-pay | Admitting: Emergency Medicine

## 2019-05-15 DIAGNOSIS — S0003XA Contusion of scalp, initial encounter: Secondary | ICD-10-CM | POA: Insufficient documentation

## 2019-05-15 DIAGNOSIS — W010XXA Fall on same level from slipping, tripping and stumbling without subsequent striking against object, initial encounter: Secondary | ICD-10-CM | POA: Insufficient documentation

## 2019-05-15 DIAGNOSIS — N189 Chronic kidney disease, unspecified: Secondary | ICD-10-CM | POA: Insufficient documentation

## 2019-05-15 DIAGNOSIS — Z87891 Personal history of nicotine dependence: Secondary | ICD-10-CM | POA: Diagnosis not present

## 2019-05-15 DIAGNOSIS — S0990XA Unspecified injury of head, initial encounter: Secondary | ICD-10-CM | POA: Diagnosis not present

## 2019-05-15 DIAGNOSIS — Z794 Long term (current) use of insulin: Secondary | ICD-10-CM | POA: Insufficient documentation

## 2019-05-15 DIAGNOSIS — Y92002 Bathroom of unspecified non-institutional (private) residence single-family (private) house as the place of occurrence of the external cause: Secondary | ICD-10-CM | POA: Insufficient documentation

## 2019-05-15 DIAGNOSIS — Y939 Activity, unspecified: Secondary | ICD-10-CM | POA: Insufficient documentation

## 2019-05-15 DIAGNOSIS — S199XXA Unspecified injury of neck, initial encounter: Secondary | ICD-10-CM | POA: Diagnosis not present

## 2019-05-15 DIAGNOSIS — Z7982 Long term (current) use of aspirin: Secondary | ICD-10-CM | POA: Insufficient documentation

## 2019-05-15 DIAGNOSIS — E1122 Type 2 diabetes mellitus with diabetic chronic kidney disease: Secondary | ICD-10-CM | POA: Diagnosis not present

## 2019-05-15 DIAGNOSIS — F0390 Unspecified dementia without behavioral disturbance: Secondary | ICD-10-CM | POA: Diagnosis not present

## 2019-05-15 DIAGNOSIS — Y999 Unspecified external cause status: Secondary | ICD-10-CM | POA: Diagnosis not present

## 2019-05-15 DIAGNOSIS — I130 Hypertensive heart and chronic kidney disease with heart failure and stage 1 through stage 4 chronic kidney disease, or unspecified chronic kidney disease: Secondary | ICD-10-CM | POA: Diagnosis not present

## 2019-05-15 DIAGNOSIS — I10 Essential (primary) hypertension: Secondary | ICD-10-CM | POA: Diagnosis not present

## 2019-05-15 DIAGNOSIS — I5032 Chronic diastolic (congestive) heart failure: Secondary | ICD-10-CM | POA: Diagnosis not present

## 2019-05-15 DIAGNOSIS — Z79899 Other long term (current) drug therapy: Secondary | ICD-10-CM | POA: Diagnosis not present

## 2019-05-15 DIAGNOSIS — W19XXXA Unspecified fall, initial encounter: Secondary | ICD-10-CM | POA: Diagnosis not present

## 2019-05-15 LAB — BASIC METABOLIC PANEL
Anion gap: 14 (ref 5–15)
BUN: 30 mg/dL — ABNORMAL HIGH (ref 8–23)
CO2: 25 mmol/L (ref 22–32)
Calcium: 8.9 mg/dL (ref 8.9–10.3)
Chloride: 100 mmol/L (ref 98–111)
Creatinine, Ser: 1.89 mg/dL — ABNORMAL HIGH (ref 0.61–1.24)
GFR calc Af Amer: 37 mL/min — ABNORMAL LOW (ref >60)
GFR calc non Af Amer: 32 mL/min — ABNORMAL LOW (ref >60)
Glucose, Bld: 199 mg/dL — ABNORMAL HIGH (ref 70–99)
Potassium: 4.2 mmol/L (ref 3.5–5.1)
Sodium: 139 mmol/L (ref 135–145)

## 2019-05-15 LAB — CBC WITH DIFFERENTIAL/PLATELET
Abs Immature Granulocytes: 0.08 10*3/uL — ABNORMAL HIGH (ref 0.00–0.07)
Basophils Absolute: 0.1 10*3/uL (ref 0.0–0.1)
Basophils Relative: 1 %
Eosinophils Absolute: 0.1 10*3/uL (ref 0.0–0.5)
Eosinophils Relative: 1 %
HCT: 51.4 % (ref 39.0–52.0)
Hemoglobin: 17.3 g/dL — ABNORMAL HIGH (ref 13.0–17.0)
Immature Granulocytes: 1 %
Lymphocytes Relative: 20 %
Lymphs Abs: 1.6 10*3/uL (ref 0.7–4.0)
MCH: 28.7 pg (ref 26.0–34.0)
MCHC: 33.7 g/dL (ref 30.0–36.0)
MCV: 85.4 fL (ref 80.0–100.0)
Monocytes Absolute: 0.5 10*3/uL (ref 0.1–1.0)
Monocytes Relative: 7 %
Neutro Abs: 5.8 10*3/uL (ref 1.7–7.7)
Neutrophils Relative %: 70 %
Platelets: 182 10*3/uL (ref 150–400)
RBC: 6.02 MIL/uL — ABNORMAL HIGH (ref 4.22–5.81)
RDW: 15.4 % (ref 11.5–15.5)
WBC: 8.2 10*3/uL (ref 4.0–10.5)
nRBC: 0 % (ref 0.0–0.2)

## 2019-05-15 LAB — URINALYSIS, COMPLETE (UACMP) WITH MICROSCOPIC
Bilirubin Urine: NEGATIVE
Glucose, UA: NEGATIVE mg/dL
Ketones, ur: NEGATIVE mg/dL
Nitrite: NEGATIVE
Protein, ur: 30 mg/dL — AB
Specific Gravity, Urine: 1.016 (ref 1.005–1.030)
pH: 5 (ref 5.0–8.0)

## 2019-05-15 MED ORDER — LACTATED RINGERS IV BOLUS
500.0000 mL | Freq: Once | INTRAVENOUS | Status: AC
  Administered 2019-05-15: 500 mL via INTRAVENOUS

## 2019-05-15 NOTE — ED Notes (Signed)
Transported to CT 

## 2019-05-15 NOTE — ED Triage Notes (Signed)
Pt here after fall this AM. Denies pain. Daughter was present and pt lost balance. Pt oriented to place. When asked year states "does it matter what year it is".  Pt has dementia at baseline.

## 2019-05-15 NOTE — ED Notes (Signed)
Pt in bed with daughter at bedside. Denies pain.

## 2019-05-15 NOTE — ED Provider Notes (Signed)
[facility] Emergency Department Provider Note   ____________________________________________   First MD Initiated Contact with Patient 05/15/19 [phone]     (approximate)  I have reviewed the triage vital signs and the nursing notes.   HISTORY  Chief Complaint Fall    HPI Damon [name] is a 86 y.o. male with past medical history of dementia, hypertension, diabetes, CKD, and CHF who presents to the ED for fall.  History is limited due to patient's dementia.  Per EMS and daughter at bedside, patient was in the bathroom being assisted by his daughter when he suddenly fell backwards, striking the back of his head on the floor.  He did not lose consciousness and has been at his baseline mental status since the fall, does not take blood thinners.  Patient currently denies any complaints, specifically denies any headache, neck pain, chest pain, abdominal pain, or extremity pain.  Patient states he is not sure why he fell, but per daughter he has been less steady on his feet over the past couple of days.  He had his suprapubic catheter changed yesterday and has not had any fevers.        Past Medical History:  Diagnosis Date  . Arrhythmia    Baseline bradycardia  . Bradycardia   . CHF (congestive heart failure) ([place])   . Chronic kidney disease    B[place]eline creatinine 1.4 to 1.5  . Dementia ([place])   . Diabetes mellitus   . Hard of hearing   . Hypertension     Patient Active Problem List   Diagnosis Date Noted  . Aortic atherosclerosis ([place]) 11/24/2018  . Urinary retention 07/15/2018  . DM type 2 with diabetic peripheral neuropathy ([place]) 04/07/2018  . Bilateral leg edema 03/18/2018  . Sepsis ([place]) 03/03/2018  . 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 Visit for well man health check 08/07/2016  . Mood swings 10/11/2015  . Chronic renal disease 09/11/2013  . Hypoglycemia 09/11/2013  . Diastolic CHF ([place]) 78/58/8502  . Hyperlipidemia 09/09/2013  . Urge incontinence  11/19/2012  . Incomplete emptying of bladder 11/19/2012  . Enlarged prostate with lower urinary tract symptoms (LUTS) 11/19/2012  . Benign localized hyperplasia of prostate with urinary obstruction and other lower urinary tract symptoms (LUTS)(600.21) 11/19/2012  . Uncontrolled type II diabetes mellitus with nephropathy ([place]) 12/04/2005  . DIABETIC PERIPHERAL NEUROPATHY 12/04/2005  . OBESITY 12/04/2005  . Essential hypertension 12/04/2005    Past Surgical History:  Procedure Laterality Date  . COLONOSCOPY  01/01/2012   Procedure: COLONOSCOPY;  Surgeon: [name], MD;  [facility] ENDOSCOPY;  Service: Endoscopy;  Laterality: N/A;  . IR CATHETER TUBE CHANGE  05/16/2018  . IR CATHETER TUBE CHANGE  06/13/2018  . PARATHYROIDECTOMY    . TONSILLECTOMY      Prior to Admission medications   Medication Sig Start Date End Date Taking? Authorizing Provider  amLODipine (NORVASC) 5 MG tablet Take 1 tablet by mouth 2 (two) times daily.    [provider]  aspirin 81 MG tablet Take 81 mg by mouth daily.      [provider]  chlorpheniramine-HYDROcodone (TUSSIONEX) 10-8 MG/5ML SUER Take 5 mLs by mouth every 12 (twelve) hours. 03/21/18   Gladstone Lighter, MD  cyanocobalamin 1000 MCG tablet Take by mouth.    [provider]  finasteride (PROSCAR) 5 MG tablet Take 1 tablet (5 mg total) by mouth daily. 03/22/18   Gladstone Lighter, MD  fluconazole (DIFLUCAN) 150 MG tablet Take 150 mg  by mouth once. 12/10/18   [provider]  furosemide (LASIX) 40 MG tablet Take 1 tablet (40 mg total) by mouth daily. 03/21/18 06/19/18  Gladstone Lighter, MD  gabapentin (NEURONTIN) 100 MG capsule Take 100 mg by mouth 3 (three) times daily. 03/31/19   [provider]  gemfibrozil (LOPID) 600 MG tablet TAKE 1 TABLET BY MOUTH TWICE DAILY BEFORE  MEALS 10/21/17   Lucille Passy, MD  glucose blood test strip Use as instructed 04/04/15   Lucille Passy, MD  insulin NPH-regular  Human (70-30) 100 UNIT/ML injection Inject 15 Units into the skin 2 (two) times daily with a meal. 03/21/18 07/19/18  Gladstone Lighter, MD  Insulin Syringe-Needle U-100 (B-D INS SYRINGE 0.5CC/30GX1/2") 30G X 1/2" 0.5 ML MISC Use to inject insulin twice a day. 04/04/15   Lucille Passy, MD  latanoprost (XALATAN) 0.005 % ophthalmic solution Place 1 drop into both eyes at bedtime. 04/08/18   [provider]  memantine (NAMENDA) 5 MG tablet  12/22/18   [provider]  metFORMIN (GLUCOPHAGE-XR) 500 MG 24 hr tablet Take by mouth. 06/19/18 06/19/19  [provider]  nystatin (MYCOSTATIN/NYSTOP) powder Apply topically. 01/26/19 01/26/20  [provider]  nystatin-triamcinolone ointment (MYCOLOG) Apply 1 application topically 2 (two) times daily. 11/24/18   Vaillancourt, Aldona Bar, PA-C  Omega-3 Fatty Acids (FISH OIL PO) Take 1 capsule by mouth daily.    [provider]  potassium chloride 20 MEQ TBCR Take 20 mEq by mouth daily. While on lasix 03/21/18   Gladstone Lighter, MD  potassium chloride SA (K-DUR) 20 MEQ tablet Take by mouth. 05/09/18   [provider]  Semaglutide,0.25 or 0.5MG /DOS, 2 MG/1.5ML SOPN Inject into the skin. 04/06/19 07/05/19  [provider]  tamsulosin (FLOMAX) 0.4 MG CAPS capsule Take 2 capsules (0.8 mg total) by mouth daily. 03/21/18   Gladstone Lighter, MD  Vitamin D, Ergocalciferol, (DRISDOL) 50000 units CAPS capsule TAKE 1 CAPSULE BY MOUTH ONCE A WEEK Patient taking differently: Takes on Monday 09/09/17   Lucille Passy, MD    Allergies Penicillins  Family History  Problem Relation Age of Onset  . Diabetes type II Mother   . Hypertension Mother   . Diabetes type II Father   . Hypertension Father     Social History Social History   Tobacco Use  . Smoking status: Never Smoker  . Smokeless tobacco: Former Systems developer    Types: Chew  . Tobacco comment: chewing more than 40 years/chew 1PPD  Substance Use Topics  . Alcohol  use: No    Alcohol/week: 0.0 standard drinks  . Drug use: No    Review of Systems  Constitutional: No fever/chills.  Positive for fall and unsteady gait. Eyes: No visual changes. ENT: No sore throat. Cardiovascular: Denies chest pain. Respiratory: Denies shortness of breath. Gastrointestinal: No abdominal pain.  No nausea, no vomiting.  No diarrhea.  No constipation. Genitourinary: Negative for dysuria. Musculoskeletal: Negative for back pain. Skin: Negative for rash. Neurological: Negative for headaches, focal weakness or numbness.  ____________________________________________   PHYSICAL EXAM:  VITAL SIGNS: ED Triage Vitals [05/15/19 0735]  Enc Vitals Group     BP 122/67     Pulse Rate 80     Resp 18     Temp 97.8 F (36.6 C)     Temp Source Oral     SpO2 95 %     Weight 224 lb 3.3 oz (101.7 kg)     Height  Head Circumference      Peak Flow      Pain Score 0     Pain Loc      Pain Edu?      Excl. in [place]?     Constitutional: Alert and oriented. Eyes: Conjunctivae are normal. Head: Abrasion to posterior scalp with no hematomas or step-offs. Nose: No congestion/rhinnorhea. Mouth/Throat: Mucous membranes are moist. Neck: Normal ROM Cardiovascular: Normal rate, regular rhythm. Grossly normal heart sounds. Respiratory: Normal respiratory effort.  No retractions. Lungs CTAB. Gastrointestinal: Soft and nontender. No distention.  Suprapubic catheter in place draining clear yellow urine. Genitourinary: deferred Musculoskeletal: No lower extremity tenderness nor edema. Neurologic:  Normal speech and language. No gross focal neurologic deficits are appreciated. Skin:  Skin is warm, dry and intact. No rash noted. Psychiatric: Mood and affect are normal. Speech and behavior are normal.  ____________________________________________   LABS (all labs ordered are listed, but only abnormal results are displayed)  Labs Reviewed  BASIC METABOLIC PANEL - Abnormal;  Notable for the following components:      Result Value   Glucose, Bld 199 (*)    BUN 30 (*)    Creatinine, Ser 1.89 (*)    GFR calc non Af Amer 32 (*)    GFR calc Af Amer 37 (*)    All other components within normal limits  CBC WITH DIFFERENTIAL/PLATELET - Abnormal; Notable for the following components:   RBC 6.02 (*)    Hemoglobin 17.3 (*)    Abs Immature Granulocytes 0.08 (*)    All other components within normal limits  URINALYSIS, COMPLETE (UACMP) WITH MICROSCOPIC - Abnormal; Notable for the following components:   Color, Urine YELLOW (*)    APPearance CLEAR (*)    Hgb urine dipstick SMALL (*)    Protein, ur 30 (*)    Leukocytes,Ua LARGE (*)    Bacteria, UA FEW (*)    All other components within normal limits   ____________________________________________  EKG  ED ECG REPORT I, Blake Divine, the attending physician, personally viewed and interpreted this ECG.   Date: 05/15/2019  EKG Time: 8:24  Rate: 79  Rhythm: normal sinus rhythm  Axis: Normal  Intervals:left bundle branch block  ST&T Change: None, negative sgarbossa   PROCEDURES  Procedure(s) performed (including Critical Care):  Procedures   ____________________________________________   INITIAL IMPRESSION / ASSESSMENT AND PLAN / ED COURSE       86 year old male with history of dementia, hypertension, diabetes, CKD, and CHF presents to the ED after falling backwards and striking his head.  He arrives to the ED awake and alert with no focal neurologic deficits, is not anticoagulated.  CT imaging of head and C-spine is negative for sequela of trauma.  Daughter does state that he has seemed more unsteady on his feet over the past couple of days, screening labs were performed and remarkable only for mild dehydration.  EKG shows no evidence of ischemia or arrhythmia.  Patient was hydrated with small bolus of IV fluids and remains comfortable in the ED with no pain.  He is appropriate for discharge home with  PCP follow-up, daughter counseled to have patient return to the ED for new or worsening symptoms.  Patient and daughter agree with plan.      ____________________________________________   FINAL CLINICAL IMPRESSION(S) / ED DIAGNOSES  Final diagnoses:  Fall, initial encounter  Contusion of scalp, initial encounter     ED Discharge Orders    None  Note:  This document was prepared using Dragon voice recognition software and may include unintentional dictation errors.   Blake Divine, MD 05/15/19 1026

## 2019-05-15 NOTE — ED Notes (Signed)
Pt cleaned of bowel movement, new brief placed.

## 2019-05-19 DIAGNOSIS — G309 Alzheimer's disease, unspecified: Secondary | ICD-10-CM | POA: Diagnosis not present

## 2019-05-19 DIAGNOSIS — W010XXD Fall on same level from slipping, tripping and stumbling without subsequent striking against object, subsequent encounter: Secondary | ICD-10-CM | POA: Diagnosis not present

## 2019-05-19 DIAGNOSIS — Y92009 Unspecified place in unspecified non-institutional (private) residence as the place of occurrence of the external cause: Secondary | ICD-10-CM | POA: Diagnosis not present

## 2019-05-19 DIAGNOSIS — E669 Obesity, unspecified: Secondary | ICD-10-CM | POA: Diagnosis not present

## 2019-05-19 DIAGNOSIS — E1169 Type 2 diabetes mellitus with other specified complication: Secondary | ICD-10-CM | POA: Diagnosis not present

## 2019-05-19 DIAGNOSIS — N1832 Chronic kidney disease, stage 3b: Secondary | ICD-10-CM | POA: Diagnosis not present

## 2019-05-19 DIAGNOSIS — F0390 Unspecified dementia without behavioral disturbance: Secondary | ICD-10-CM | POA: Diagnosis not present

## 2019-05-19 DIAGNOSIS — E1121 Type 2 diabetes mellitus with diabetic nephropathy: Secondary | ICD-10-CM | POA: Diagnosis not present

## 2019-05-19 DIAGNOSIS — E113293 Type 2 diabetes mellitus with mild nonproliferative diabetic retinopathy without macular edema, bilateral: Secondary | ICD-10-CM | POA: Diagnosis not present

## 2019-05-19 DIAGNOSIS — E1142 Type 2 diabetes mellitus with diabetic polyneuropathy: Secondary | ICD-10-CM | POA: Diagnosis not present

## 2019-05-19 DIAGNOSIS — F0280 Dementia in other diseases classified elsewhere without behavioral disturbance: Secondary | ICD-10-CM | POA: Diagnosis not present

## 2019-05-19 DIAGNOSIS — Z794 Long term (current) use of insulin: Secondary | ICD-10-CM | POA: Diagnosis not present

## 2019-05-19 DIAGNOSIS — E1159 Type 2 diabetes mellitus with other circulatory complications: Secondary | ICD-10-CM | POA: Diagnosis not present

## 2019-05-19 DIAGNOSIS — F0150 Vascular dementia without behavioral disturbance: Secondary | ICD-10-CM | POA: Diagnosis not present

## 2019-05-22 ENCOUNTER — Emergency Department
Admission: EM | Admit: 2019-05-22 | Discharge: 2019-05-22 | Disposition: A | Discharge status: Home / Self Care | Payer: HMO | Attending: Emergency Medicine | Admitting: Emergency Medicine

## 2019-05-22 ENCOUNTER — Emergency Department: Payer: HMO

## 2019-05-22 ENCOUNTER — Other Ambulatory Visit: Payer: Self-pay

## 2019-05-22 DIAGNOSIS — I509 Heart failure, unspecified: Secondary | ICD-10-CM | POA: Insufficient documentation

## 2019-05-22 DIAGNOSIS — I10 Essential (primary) hypertension: Secondary | ICD-10-CM | POA: Diagnosis not present

## 2019-05-22 DIAGNOSIS — Z79899 Other long term (current) drug therapy: Secondary | ICD-10-CM | POA: Diagnosis not present

## 2019-05-22 DIAGNOSIS — F0390 Unspecified dementia without behavioral disturbance: Secondary | ICD-10-CM | POA: Diagnosis not present

## 2019-05-22 DIAGNOSIS — Z7982 Long term (current) use of aspirin: Secondary | ICD-10-CM | POA: Diagnosis not present

## 2019-05-22 DIAGNOSIS — Z87891 Personal history of nicotine dependence: Secondary | ICD-10-CM | POA: Diagnosis not present

## 2019-05-22 DIAGNOSIS — E1122 Type 2 diabetes mellitus with diabetic chronic kidney disease: Secondary | ICD-10-CM | POA: Diagnosis not present

## 2019-05-22 DIAGNOSIS — Z794 Long term (current) use of insulin: Secondary | ICD-10-CM | POA: Diagnosis not present

## 2019-05-22 DIAGNOSIS — R4781 Slurred speech: Secondary | ICD-10-CM | POA: Diagnosis not present

## 2019-05-22 DIAGNOSIS — H401131 Primary open-angle glaucoma, bilateral, mild stage: Secondary | ICD-10-CM | POA: Diagnosis not present

## 2019-05-22 DIAGNOSIS — R4701 Aphasia: Secondary | ICD-10-CM | POA: Diagnosis present

## 2019-05-22 DIAGNOSIS — G309 Alzheimer's disease, unspecified: Secondary | ICD-10-CM | POA: Diagnosis not present

## 2019-05-22 DIAGNOSIS — I130 Hypertensive heart and chronic kidney disease with heart failure and stage 1 through stage 4 chronic kidney disease, or unspecified chronic kidney disease: Secondary | ICD-10-CM | POA: Insufficient documentation

## 2019-05-22 DIAGNOSIS — N189 Chronic kidney disease, unspecified: Secondary | ICD-10-CM | POA: Diagnosis not present

## 2019-05-22 DIAGNOSIS — R410 Disorientation, unspecified: Secondary | ICD-10-CM | POA: Insufficient documentation

## 2019-05-22 LAB — URINALYSIS, COMPLETE (UACMP) WITH MICROSCOPIC
Bilirubin Urine: NEGATIVE
Glucose, UA: NEGATIVE mg/dL
Ketones, ur: NEGATIVE mg/dL
Nitrite: NEGATIVE
Protein, ur: NEGATIVE mg/dL
Specific Gravity, Urine: 1.008 (ref 1.005–1.030)
Squamous Epithelial / HPF: NONE SEEN (ref 0–5)
pH: 5 (ref 5.0–8.0)

## 2019-05-22 LAB — CBC WITH DIFFERENTIAL/PLATELET
Abs Immature Granulocytes: 0.09 10*3/uL — ABNORMAL HIGH (ref 0.00–0.07)
Basophils Absolute: 0.1 10*3/uL (ref 0.0–0.1)
Basophils Relative: 1 %
Eosinophils Absolute: 0.1 10*3/uL (ref 0.0–0.5)
Eosinophils Relative: 2 %
HCT: 44.5 % (ref 39.0–52.0)
Hemoglobin: 15 g/dL (ref 13.0–17.0)
Immature Granulocytes: 1 %
Lymphocytes Relative: 24 %
Lymphs Abs: 1.6 10*3/uL (ref 0.7–4.0)
MCH: 28.9 pg (ref 26.0–34.0)
MCHC: 33.7 g/dL (ref 30.0–36.0)
MCV: 85.7 fL (ref 80.0–100.0)
Monocytes Absolute: 0.7 10*3/uL (ref 0.1–1.0)
Monocytes Relative: 10 %
Neutro Abs: 4.4 10*3/uL (ref 1.7–7.7)
Neutrophils Relative %: 62 %
Platelets: 219 10*3/uL (ref 150–400)
RBC: 5.19 MIL/uL (ref 4.22–5.81)
RDW: 15 % (ref 11.5–15.5)
WBC: 7 10*3/uL (ref 4.0–10.5)
nRBC: 0 % (ref 0.0–0.2)

## 2019-05-22 LAB — COMPREHENSIVE METABOLIC PANEL
ALT: 13 U/L (ref 0–44)
AST: 12 U/L — ABNORMAL LOW (ref 15–41)
Albumin: 3.5 g/dL (ref 3.5–5.0)
Alkaline Phosphatase: 117 U/L (ref 38–126)
Anion gap: 14 (ref 5–15)
BUN: 30 mg/dL — ABNORMAL HIGH (ref 8–23)
CO2: 24 mmol/L (ref 22–32)
Calcium: 9 mg/dL (ref 8.9–10.3)
Chloride: 102 mmol/L (ref 98–111)
Creatinine, Ser: 1.58 mg/dL — ABNORMAL HIGH (ref 0.61–1.24)
GFR calc Af Amer: 46 mL/min — ABNORMAL LOW (ref >60)
GFR calc non Af Amer: 40 mL/min — ABNORMAL LOW (ref >60)
Glucose, Bld: 76 mg/dL (ref 70–99)
Potassium: 3.7 mmol/L (ref 3.5–5.1)
Sodium: 140 mmol/L (ref 135–145)
Total Bilirubin: 0.9 mg/dL (ref 0.3–1.2)
Total Protein: 7.5 g/dL (ref 6.5–8.1)

## 2019-05-22 LAB — GLUCOSE, CAPILLARY: Glucose-Capillary: 105 mg/dL — ABNORMAL HIGH (ref 70–99)

## 2019-05-22 NOTE — ED Notes (Signed)
Pt wheeled out to vehicle.

## 2019-05-22 NOTE — ED Notes (Signed)
Per family at bedside pt has type 2 DM; states thinks he has been eating normally past few days; reports pt was initially more confused than his baseline dementia then began having slurred speech.

## 2019-05-22 NOTE — ED Provider Notes (Signed)
[facility] Emergency Department Provider Note  ____________________________________________   I have reviewed the triage vital signs and the nursing notes.   HISTORY  Chief Complaint Confusion  History limited by: Altered Mental Status, history obtained primarily from daughter   HPI Damon [name] is a 86 y.o. male who presents to the emergency department today brought in by daughter because of concerns for is decreased confusion and mental changes.  Daughter states that since a recent fall she has noticed that he has become more confused and disoriented.  He also appears to be hallucinating talking to dead relatives.  She is also noticed increased difficulty with ambulation and coordination.  She says that the symptoms started a couple days after the fall.  She does have a history of dementia but is never had such severe symptoms of confusion.  She denies noticing any fevers.  Patient does have a Foley catheter in place.  She has not noticed any change in the urine.  Records reviewed. Per medical record review patient has a history of CHF, recent fall with evaluation at the ED with negative head CT.   Past Medical History:  Diagnosis Date  . Arrhythmia    Baseline bradycardia  . Bradycardia   . CHF (congestive heart failure) ([place])   . Chronic kidney disease    B[place]eline creatinine 1.4 to 1.5  . Dementia ([place])   . Diabetes mellitus   . Hard of hearing   . Hypertension     Patient Active Problem List   Diagnosis Date Noted  . Aortic atherosclerosis ([place]) 11/24/2018  . Urinary retention 07/15/2018  . DM type 2 with diabetic peripheral neuropathy ([place]) 04/07/2018  . Bilateral leg edema 03/18/2018  . Sepsis ([place]) 03/03/2018  . 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 Visit for well man health check 08/07/2016  . Mood swings 10/11/2015  . Chronic renal disease 09/11/2013  . Hypoglycemia 09/11/2013  . Diastolic CHF ([place]) 44/01/270  . Hyperlipidemia 09/09/2013  .  Urge incontinence 11/19/2012  . Incomplete emptying of bladder 11/19/2012  . Enlarged prostate with lower urinary tract symptoms (LUTS) 11/19/2012  . Benign localized hyperplasia of prostate with urinary obstruction and other lower urinary tract symptoms (LUTS)(600.21) 11/19/2012  . 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 DIABETIC PERIPHERAL NEUROPATHY 12/04/2005  . OBESITY 12/04/2005  . Essential hypertension 12/04/2005    Past Surgical History:  Procedure Laterality Date  . COLONOSCOPY  01/01/2012   Procedure: COLONOSCOPY;  Surgeon: [name], MD;  [facility]: [facility] ENDOSCOPY;  Service: Endoscopy;  Laterality: N/A;  . IR CATHETER TUBE CHANGE  05/16/2018  . IR CATHETER TUBE CHANGE  06/13/2018  . PARATHYROIDECTOMY    . TONSILLECTOMY      Prior to Admission medications   Medication Sig Start Date End Date Taking? Authorizing Provider  amLODipine (NORVASC) 5 MG tablet Take 1 tablet by mouth 2 (two) times daily.    [provider]  aspirin 81 MG tablet Take 81 mg by mouth daily.      [provider]  chlorpheniramine-HYDROcodone (TUSSIONEX) 10-8 MG/5ML SUER Take 5 mLs by mouth every 12 (twelve) hours. 03/21/18   Gladstone Lighter, MD  cyanocobalamin 1000 MCG tablet Take by mouth.    [provider]  finasteride (PROSCAR) 5 MG tablet Take 1 tablet (5 mg total) by mouth daily. 03/22/18   Gladstone Lighter, MD  fluconazole (DIFLUCAN) 150 MG tablet Take 150 mg by mouth once. 12/10/18   [provider]  furosemide (LASIX) 40 MG tablet Take 1 tablet (40 mg total) by mouth daily. 03/21/18 06/19/18  Gladstone Lighter, MD  gabapentin (NEURONTIN) 100 MG capsule Take 100 mg by mouth 3 (three) times daily. 03/31/19   [provider]  gemfibrozil (LOPID) 600 MG tablet TAKE 1 TABLET BY MOUTH TWICE DAILY BEFORE  MEALS 10/21/17   Lucille Passy, MD  glucose blood test strip Use as instructed 04/04/15   Lucille Passy, MD   insulin NPH-regular Human (70-30) 100 UNIT/ML injection Inject 15 Units into the skin 2 (two) times daily with a meal. 03/21/18 07/19/18  Gladstone Lighter, MD  Insulin Syringe-Needle U-100 (B-D INS SYRINGE 0.5CC/30GX1/2") 30G X 1/2" 0.5 ML MISC Use to inject insulin twice a day. 04/04/15   Lucille Passy, MD  latanoprost (XALATAN) 0.005 % ophthalmic solution Place 1 drop into both eyes at bedtime. 04/08/18   [provider]  memantine (NAMENDA) 5 MG tablet  12/22/18   [provider]  metFORMIN (GLUCOPHAGE-XR) 500 MG 24 hr tablet Take by mouth. 06/19/18 06/19/19  [provider]  nystatin (MYCOSTATIN/NYSTOP) powder Apply topically. 01/26/19 01/26/20  [provider]  nystatin-triamcinolone ointment (MYCOLOG) Apply 1 application topically 2 (two) times daily. 11/24/18   Vaillancourt, Aldona Bar, PA-C  Omega-3 Fatty Acids (FISH OIL PO) Take 1 capsule by mouth daily.    [provider]  potassium chloride 20 MEQ TBCR Take 20 mEq by mouth daily. While on lasix 03/21/18   Gladstone Lighter, MD  potassium chloride SA (K-DUR) 20 MEQ tablet Take by mouth. 05/09/18   [provider]  Semaglutide,0.25 or 0.5MG /DOS, 2 MG/1.5ML SOPN Inject into the skin. 04/06/19 07/05/19  [provider]  tamsulosin (FLOMAX) 0.4 MG CAPS capsule Take 2 capsules (0.8 mg total) by mouth daily. 03/21/18   Gladstone Lighter, MD  Vitamin D, Ergocalciferol, (DRISDOL) 50000 units CAPS capsule TAKE 1 CAPSULE BY MOUTH ONCE A WEEK Patient taking differently: Takes on Monday 09/09/17   Lucille Passy, MD    Allergies Penicillins  Family History  Problem Relation Age of Onset  . Diabetes type II Mother   . Hypertension Mother   . Diabetes type II Father   . Hypertension Father     Social History Social History   Tobacco Use  . Smoking status: Never Smoker  . Smokeless tobacco: Former Systems developer    Types: Chew  . Tobacco comment: chewing more than 40 years/chew 1PPD  Substance Use  Topics  . Alcohol use: No    Alcohol/week: 0.0 standard drinks  . Drug use: No    Review of Systems Constitutional: No fever/chills Eyes: No visual changes. ENT: No sore throat. Cardiovascular: Denies chest pain. Respiratory: Denies shortness of breath. Gastrointestinal: No abdominal pain.  No nausea, no vomiting.  No diarrhea.   Genitourinary: Negative for dysuria. Musculoskeletal: Negative for back pain. Skin: Negative for rash. Neurological: Positive for confusion, AMS. ____________________________________________   PHYSICAL EXAM:  VITAL SIGNS: ED Triage Vitals  Enc Vitals Group     BP 05/22/19 1035 133/66     Pulse Rate 05/22/19 1035 66     Resp 05/22/19 1035 18     Temp 05/22/19 1035 (!) 97.5 F (36.4 C)     Temp Source 05/22/19 1035 Oral     SpO2 05/22/19 1035 94 %     Weight 05/22/19 1036 228 lb (103.4 kg)     Height 05/22/19 1036 5\' 7"  (1.702 m)     Head Circumference --  Peak Flow --      Pain Score 05/22/19 1036 0   Constitutional: Awake and alert. Not oriented. Eyes: Conjunctivae are normal.  ENT      Head: Normocephalic and atraumatic.      Nose: No congestion/rhinnorhea.      Mouth/Throat: Mucous membranes are moist.      Neck: No stridor. Hematological/Lymphatic/Immunilogical: No cervical lymphadenopathy. Cardiovascular: Normal rate, regular rhythm.  No murmurs, rubs, or gallops.  Respiratory: Normal respiratory effort without tachypnea nor retractions. Breath sounds are clear and equal bilaterally. No wheezes/rales/rhonchi. Gastrointestinal: Soft and non tender. No rebound. No guarding.  Genitourinary: Deferred Musculoskeletal: Normal range of motion in all extremities. No lower extremity edema. Neurologic:  Appears slightly aphagic, not oriented. Moving all extremities.  Skin:  Skin is warm, dry and intact. No rash noted. Psychiatric: Mood and affect are normal.   ____________________________________________    LABS (pertinent  positives/negatives)  CBC wbc 7.0, hgb 15.0, plt 219 CMP wnl except bun 30, cr 1.58, ast 12 UA hazy, small hgb dipstick, moderate leukocytes, 6-10 rbc, 21-50 wbc, rare bacteria ____________________________________________   EKG  I, Nance Pear, attending physician, personally viewed and interpreted this EKG  EKG Time: 1038 Rate: 68 Rhythm: sinus rhythm with 1st degree av block Axis: left axis deviation Intervals: qtc 457 QRS: LBBB ST changes: no st elevation Impression: abnormal ekg  ____________________________________________    RADIOLOGY  MR brain No acute abnormality  ____________________________________________   PROCEDURES  Procedures  ____________________________________________   INITIAL IMPRESSION / ASSESSMENT AND PLAN / ED COURSE  Pertinent labs & imaging results that were available during my care of the patient were reviewed by me and considered in my medical decision making (see chart for details).   Patient presented to the emergency department today brought in by family because of concerns for increased confusion recently after a fall.  MRI of the brain did not show any acute stroke or concerning or reversible findings.  Patient's blood work without any obvious electrolyte abnormalities that might explain altered mental status.  Urine did have some white blood cells however patient has an indwelling catheter.  I discussed this with the patient's family.  Will send for urine culture although given lack of other infectious findings would defer antibiotics.  Discussed importance of primary care follow-up.  Also encouraged neurology follow-up.  ____________________________________________   FINAL CLINICAL IMPRESSION(S) / ED DIAGNOSES  Final diagnoses:  Confusion     Note: This dictation was prepared with Dragon dictation. Any transcriptional errors that result from this process are unintentional     Nance Pear, MD 05/22/19 1428

## 2019-05-22 NOTE — ED Notes (Signed)
Pt to MRI; this RN to complete stroke screen once pt back to room.

## 2019-05-22 NOTE — ED Triage Notes (Addendum)
Slurred speech that began yesterday. Daughter reports fall last Friday, confusion that started Wednesday and slurred speech Thursday.   Pt oriented to self, place, disoriented to time (baseline). Unknown why he is here. Pt with hx of dementia. Was seen in ED after fall. Has chronic catheter in place.

## 2019-05-22 NOTE — ED Notes (Signed)
Family remains at bedside with pt. Bed locked low. Rails up. Call bell within reach.

## 2019-05-22 NOTE — Discharge Instructions (Addendum)
Please seek medical attention for any high fevers, chest pain, shortness of breath, change in behavior, persistent vomiting, bloody stool or any other new or concerning symptoms.  

## 2019-05-23 LAB — URINE CULTURE: Culture: NO GROWTH

## 2019-06-04 DIAGNOSIS — B351 Tinea unguium: Secondary | ICD-10-CM | POA: Diagnosis not present

## 2019-06-04 DIAGNOSIS — Z794 Long term (current) use of insulin: Secondary | ICD-10-CM | POA: Diagnosis not present

## 2019-06-04 DIAGNOSIS — L851 Acquired keratosis [keratoderma] palmaris et plantaris: Secondary | ICD-10-CM | POA: Diagnosis not present

## 2019-06-04 DIAGNOSIS — E1140 Type 2 diabetes mellitus with diabetic neuropathy, unspecified: Secondary | ICD-10-CM | POA: Diagnosis not present

## 2019-06-11 DIAGNOSIS — E538 Deficiency of other specified B group vitamins: Secondary | ICD-10-CM | POA: Diagnosis not present

## 2019-06-11 DIAGNOSIS — F0280 Dementia in other diseases classified elsewhere without behavioral disturbance: Secondary | ICD-10-CM | POA: Diagnosis not present

## 2019-06-11 DIAGNOSIS — F0150 Vascular dementia without behavioral disturbance: Secondary | ICD-10-CM | POA: Diagnosis not present

## 2019-06-11 DIAGNOSIS — G309 Alzheimer's disease, unspecified: Secondary | ICD-10-CM | POA: Diagnosis not present

## 2019-06-16 ENCOUNTER — Other Ambulatory Visit: Payer: Self-pay

## 2019-06-16 ENCOUNTER — Ambulatory Visit: Payer: Self-pay | Admitting: Physician Assistant

## 2019-06-16 NOTE — Patient Outreach (Signed)
  [facility]) Care Management Chronic Special Needs Program    06/16/2019  Name: Damon [name], DOB: [birth year]  MRN: [id number]   Mr. Damon [name] is enrolled in a chronic special needs plan for Diabetes. RNCM called to follow up, review health risk assessment, update care plan. No answer. HIPPA compliant message left.  Note: notification received that client is with [facility] as of 06/12/2019. Also noted, client is active with [facility] as of 06/03/2019.Marland Kitchen  Plan: RNCM will attempt outreach within the next 1-2 weeks.  [name], RN, MSN, [place] [phone]

## 2019-06-17 ENCOUNTER — Other Ambulatory Visit: Payer: Self-pay

## 2019-06-17 NOTE — Patient Outreach (Signed)
  [facility]) Care Management Chronic Special Needs Program  06/17/2019  Name: Damon [name] DOB: Sep 21, 1933  MRN: [id number]  Mr. Damon [name] is enrolled in a chronic special needs plan for Diabetes. Chronic Care Management Coordinator telephoned client to follow up, review health risk assessment and to update individualized care plan.  RNCM reviewed the chronic care management program, importance of client participation, and taking their care plan to all provider appointments and inpatient facilities.    Subjective:RNCM spoke with client daughter, caregiver. She reports that client is not able to stand on his on, she reports she assist client up to chair and with ambulation. Client has hospital bed and is able to move self around some in hospital bed. She reports client has advancing dementia and confusion and states she has to do everything for him at this time. She reports she checked client's blood sugar this morning and it was 113. She denies any hypoglycemic episodes. She states client does not have any signs/symptoms of heart faiure exacerbation.  Recently active with [facility]. She expresses that she is happy to have this resource to help in caring for her father. In addition the emotional support that she has for herself.    Goals Addressed            This Visit's Progress   . HEMOGLOBIN A1C < 7.0       Follow up with your provider regarding what A1C is recommended for Damon [name]. A1C 7.2 on 05/08/2019.   Diabetes self management actions:  Glucose monitoring per provider's recommendation.  Eat Healthy  Visit provider per your provider's recommendation.  Hbg A1C level per your provider's recommendation.  Take medications as recommended.  Check blood sugar as recommended by provider.    . COMPLETED: Obtain annual screen for micro albuminuria (urine) , nephropathy (kidney problems)       Per Damon [name], this was done 2020 and 2021.    Marland Kitchen Patient  Stated: symptoms management of disease processes within the next 6 months.       Call [facility] for questions, concerns as needed. Continue to take medications as recommended by your provider. Continue to monitor for signs/symptoms of heart failure exacerbation. Continue to check blood sugars. Follow up with provider for questions or concerns.    . Patient/Caregiver Stated: Physical and Spiritual needs met within the next 6 months.       Discussed the benefit of having [facility] for support in the area of physical, emotional and spiritual needs. Provided Emmi education-"Palliative care".       Damon [name] states that [facility] will no longer be involved as client is now active with [facility]. RNCM encouraged Damon [name] to call RNCM as needed.  Plan: RNCM will follow up within the next 6 months. Send updated care plan to client; send updated care plan to primary care provider.     [name], RN, MSN, [place] [phone]

## 2019-06-19 ENCOUNTER — Ambulatory Visit: Payer: Self-pay

## 2019-06-21 DIAGNOSIS — G309 Alzheimer's disease, unspecified: Secondary | ICD-10-CM | POA: Diagnosis not present

## 2019-07-22 DIAGNOSIS — G309 Alzheimer's disease, unspecified: Secondary | ICD-10-CM | POA: Diagnosis not present

## 2019-08-21 DIAGNOSIS — G309 Alzheimer's disease, unspecified: Secondary | ICD-10-CM | POA: Diagnosis not present

## 2019-09-01 DIAGNOSIS — I10 Essential (primary) hypertension: Secondary | ICD-10-CM | POA: Diagnosis not present

## 2019-09-01 DIAGNOSIS — E1122 Type 2 diabetes mellitus with diabetic chronic kidney disease: Secondary | ICD-10-CM | POA: Diagnosis not present

## 2019-09-01 DIAGNOSIS — N1832 Chronic kidney disease, stage 3b: Secondary | ICD-10-CM | POA: Diagnosis not present

## 2019-09-01 DIAGNOSIS — R338 Other retention of urine: Secondary | ICD-10-CM | POA: Diagnosis not present

## 2019-09-21 DIAGNOSIS — G309 Alzheimer's disease, unspecified: Secondary | ICD-10-CM | POA: Diagnosis not present

## 2019-10-22 DIAGNOSIS — G309 Alzheimer's disease, unspecified: Secondary | ICD-10-CM | POA: Diagnosis not present

## 2019-11-21 DIAGNOSIS — G309 Alzheimer's disease, unspecified: Secondary | ICD-10-CM | POA: Diagnosis not present

## 2019-11-23 ENCOUNTER — Emergency Department: Payer: Medicare Other

## 2019-11-23 ENCOUNTER — Other Ambulatory Visit: Payer: Self-pay

## 2019-11-23 DIAGNOSIS — Z7982 Long term (current) use of aspirin: Secondary | ICD-10-CM | POA: Insufficient documentation

## 2019-11-23 DIAGNOSIS — N189 Chronic kidney disease, unspecified: Secondary | ICD-10-CM | POA: Diagnosis not present

## 2019-11-23 DIAGNOSIS — I130 Hypertensive heart and chronic kidney disease with heart failure and stage 1 through stage 4 chronic kidney disease, or unspecified chronic kidney disease: Secondary | ICD-10-CM | POA: Insufficient documentation

## 2019-11-23 DIAGNOSIS — J90 Pleural effusion, not elsewhere classified: Secondary | ICD-10-CM | POA: Diagnosis not present

## 2019-11-23 DIAGNOSIS — I5032 Chronic diastolic (congestive) heart failure: Secondary | ICD-10-CM | POA: Diagnosis not present

## 2019-11-23 DIAGNOSIS — E1122 Type 2 diabetes mellitus with diabetic chronic kidney disease: Secondary | ICD-10-CM | POA: Diagnosis not present

## 2019-11-23 DIAGNOSIS — Z794 Long term (current) use of insulin: Secondary | ICD-10-CM | POA: Insufficient documentation

## 2019-11-23 DIAGNOSIS — Z79899 Other long term (current) drug therapy: Secondary | ICD-10-CM | POA: Insufficient documentation

## 2019-11-23 DIAGNOSIS — Z87891 Personal history of nicotine dependence: Secondary | ICD-10-CM | POA: Insufficient documentation

## 2019-11-23 DIAGNOSIS — S60922A Unspecified superficial injury of left hand, initial encounter: Secondary | ICD-10-CM | POA: Diagnosis present

## 2019-11-23 DIAGNOSIS — Z20822 Contact with and (suspected) exposure to covid-19: Secondary | ICD-10-CM | POA: Diagnosis not present

## 2019-11-23 DIAGNOSIS — W010XXA Fall on same level from slipping, tripping and stumbling without subsequent striking against object, initial encounter: Secondary | ICD-10-CM | POA: Diagnosis not present

## 2019-11-23 DIAGNOSIS — Z043 Encounter for examination and observation following other accident: Secondary | ICD-10-CM | POA: Diagnosis not present

## 2019-11-23 DIAGNOSIS — F0390 Unspecified dementia without behavioral disturbance: Secondary | ICD-10-CM | POA: Diagnosis not present

## 2019-11-23 DIAGNOSIS — S61412A Laceration without foreign body of left hand, initial encounter: Secondary | ICD-10-CM | POA: Insufficient documentation

## 2019-11-23 DIAGNOSIS — R58 Hemorrhage, not elsewhere classified: Secondary | ICD-10-CM | POA: Diagnosis not present

## 2019-11-23 DIAGNOSIS — R0902 Hypoxemia: Secondary | ICD-10-CM | POA: Diagnosis not present

## 2019-11-23 DIAGNOSIS — R404 Transient alteration of awareness: Secondary | ICD-10-CM | POA: Diagnosis not present

## 2019-11-23 DIAGNOSIS — I10 Essential (primary) hypertension: Secondary | ICD-10-CM | POA: Diagnosis not present

## 2019-11-23 LAB — BASIC METABOLIC PANEL
Anion gap: 15 (ref 5–15)
BUN: 35 mg/dL — ABNORMAL HIGH (ref 8–23)
CO2: 21 mmol/L — ABNORMAL LOW (ref 22–32)
Calcium: 9.1 mg/dL (ref 8.9–10.3)
Chloride: 103 mmol/L (ref 98–111)
Creatinine, Ser: 1.94 mg/dL — ABNORMAL HIGH (ref 0.61–1.24)
GFR, Estimated: 34 mL/min — ABNORMAL LOW (ref >60)
Glucose, Bld: 192 mg/dL — ABNORMAL HIGH (ref 70–99)
Potassium: 3.8 mmol/L (ref 3.5–5.1)
Sodium: 139 mmol/L (ref 135–145)

## 2019-11-23 LAB — HEPATIC FUNCTION PANEL
ALT: 15 U/L (ref 0–44)
AST: 16 U/L (ref 15–41)
Albumin: 3.8 g/dL (ref 3.5–5.0)
Alkaline Phosphatase: 117 U/L (ref 38–126)
Bilirubin, Direct: 0.3 mg/dL — ABNORMAL HIGH (ref 0.0–0.2)
Indirect Bilirubin: 0.6 mg/dL (ref 0.3–0.9)
Total Bilirubin: 0.9 mg/dL (ref 0.3–1.2)
Total Protein: 7.5 g/dL (ref 6.5–8.1)

## 2019-11-23 LAB — CBC
HCT: 51.5 % (ref 39.0–52.0)
Hemoglobin: 17.5 g/dL — ABNORMAL HIGH (ref 13.0–17.0)
MCH: 30.2 pg (ref 26.0–34.0)
MCHC: 34 g/dL (ref 30.0–36.0)
MCV: 88.8 fL (ref 80.0–100.0)
Platelets: 177 10*3/uL (ref 150–400)
RBC: 5.8 MIL/uL (ref 4.22–5.81)
RDW: 14.8 % (ref 11.5–15.5)
WBC: 7.2 10*3/uL (ref 4.0–10.5)
nRBC: 0 % (ref 0.0–0.2)

## 2019-11-23 LAB — BRAIN NATRIURETIC PEPTIDE: B Natriuretic Peptide: 591.9 pg/mL — ABNORMAL HIGH (ref 0.0–100.0)

## 2019-11-23 MED ORDER — SODIUM CHLORIDE 0.9 % IV BOLUS: 250.0000 mL | Freq: Once | INTRAVENOUS | Status: DC

## 2019-11-23 NOTE — ED Notes (Signed)
Pt to ER via EMS from home after slipping out of a mechanical chair.  EMS reports skin tear to top of hand.  Pt has early dementia.

## 2019-11-23 NOTE — ED Triage Notes (Signed)
Pt here with a fall today while eating. Pt's daughter states that pt was eating normally when he fell forward out of the chair. Daughter states that pt has been hallucinating bad and is not himself. Pt NAD in triage with left arm wrapped in bandage from large skin tear.

## 2019-11-24 ENCOUNTER — Emergency Department: Payer: Medicare Other

## 2019-11-24 ENCOUNTER — Emergency Department
Admission: EM | Admit: 2019-11-24 | Discharge: 2019-11-25 | Disposition: A | Discharge status: Home / Self Care | Payer: Medicare Other | Attending: Emergency Medicine | Admitting: Emergency Medicine

## 2019-11-24 ENCOUNTER — Encounter: Payer: Self-pay | Admitting: Radiology

## 2019-11-24 DIAGNOSIS — F0390 Unspecified dementia without behavioral disturbance: Secondary | ICD-10-CM

## 2019-11-24 DIAGNOSIS — Z043 Encounter for examination and observation following other accident: Secondary | ICD-10-CM | POA: Diagnosis not present

## 2019-11-24 DIAGNOSIS — S61412A Laceration without foreign body of left hand, initial encounter: Secondary | ICD-10-CM

## 2019-11-24 DIAGNOSIS — W19XXXA Unspecified fall, initial encounter: Secondary | ICD-10-CM

## 2019-11-24 LAB — URINE CULTURE: Culture: >=100000 — AB

## 2019-11-24 LAB — HEMOGLOBIN A1C
Hgb A1c MFr Bld: 6.3 % — ABNORMAL HIGH (ref 4.8–5.6)
Mean Plasma Glucose: 134.11 mg/dL

## 2019-11-24 LAB — CBG MONITORING, ED
Glucose-Capillary: 137 mg/dL — ABNORMAL HIGH (ref 70–99)
Glucose-Capillary: 125 mg/dL — ABNORMAL HIGH (ref 70–99)
Glucose-Capillary: 136 mg/dL — ABNORMAL HIGH (ref 70–99)

## 2019-11-24 MED ORDER — TAMSULOSIN HCL 0.4 MG PO CAPS
0.8000 mg | ORAL_CAPSULE | Freq: Every day | ORAL | Status: DC
  Administered 2019-11-24 – 2019-11-25 (×2): 0.8 mg via ORAL
  Filled 2019-11-24 (×2): qty 2

## 2019-11-24 MED ORDER — CEPHALEXIN 250 MG PO CAPS
250.0000 mg | ORAL_CAPSULE | Freq: Once | ORAL | Status: AC
  Administered 2019-11-24: 250 mg via ORAL
  Filled 2019-11-24: qty 1

## 2019-11-24 MED ORDER — FINASTERIDE 5 MG PO TABS
5.0000 mg | ORAL_TABLET | Freq: Every day | ORAL | Status: DC
  Administered 2019-11-24 – 2019-11-25 (×2): 5 mg via ORAL
  Filled 2019-11-24 (×3): qty 1

## 2019-11-24 MED ORDER — LATANOPROST 0.005 % OP SOLN
1.0000 [drp] | Freq: Every day | OPHTHALMIC | Status: DC
  Filled 2019-11-24: qty 2.5

## 2019-11-24 MED ORDER — ALPRAZOLAM 0.5 MG PO TABS: 0.5000 mg | ORAL_TABLET | Freq: Four times a day (QID) | ORAL | Status: DC | PRN

## 2019-11-24 MED ORDER — POTASSIUM CHLORIDE CRYS ER 20 MEQ PO TBCR
20.0000 meq | EXTENDED_RELEASE_TABLET | Freq: Every day | ORAL | Status: DC
  Administered 2019-11-24 – 2019-11-25 (×2): 20 meq via ORAL
  Filled 2019-11-24 (×2): qty 1

## 2019-11-24 MED ORDER — QUETIAPINE FUMARATE 25 MG PO TABS
25.0000 mg | ORAL_TABLET | Freq: Two times a day (BID) | ORAL | Status: DC
  Administered 2019-11-24 – 2019-11-25 (×3): 25 mg via ORAL
  Filled 2019-11-24 (×3): qty 1

## 2019-11-24 MED ORDER — INSULIN ASPART 100 UNIT/ML ~~LOC~~ SOLN
0.0000 [IU] | Freq: Three times a day (TID) | SUBCUTANEOUS | Status: DC
  Administered 2019-11-24 (×2): 2 [IU] via SUBCUTANEOUS
  Administered 2019-11-25: 11 [IU] via SUBCUTANEOUS
  Administered 2019-11-25: 8 [IU] via SUBCUTANEOUS
  Filled 2019-11-24 (×4): qty 1

## 2019-11-24 MED ORDER — CEPHALEXIN 250 MG PO CAPS
250.0000 mg | ORAL_CAPSULE | Freq: Three times a day (TID) | ORAL | Status: DC
  Administered 2019-11-24 – 2019-11-25 (×3): 250 mg via ORAL
  Filled 2019-11-24 (×3): qty 1

## 2019-11-24 MED ORDER — FUROSEMIDE 40 MG PO TABS
20.0000 mg | ORAL_TABLET | Freq: Two times a day (BID) | ORAL | Status: DC
  Administered 2019-11-24 – 2019-11-25 (×3): 20 mg via ORAL
  Filled 2019-11-24 (×3): qty 1

## 2019-11-24 MED ORDER — CEPHALEXIN 250 MG PO CAPS: 250.0000 mg | ORAL_CAPSULE | Freq: Two times a day (BID) | ORAL | 0 refills | Status: AC

## 2019-11-24 MED ORDER — AMLODIPINE BESYLATE 5 MG PO TABS
2.5000 mg | ORAL_TABLET | Freq: Two times a day (BID) | ORAL | Status: DC
  Administered 2019-11-24 – 2019-11-25 (×3): 2.5 mg via ORAL
  Filled 2019-11-24 (×3): qty 1

## 2019-11-24 MED ORDER — VITAMIN B-12 1000 MCG PO TABS
1000.0000 ug | ORAL_TABLET | Freq: Every day | ORAL | Status: DC
  Administered 2019-11-24 – 2019-11-25 (×2): 1000 ug via ORAL
  Filled 2019-11-24 (×3): qty 1

## 2019-11-24 MED ORDER — GEMFIBROZIL 600 MG PO TABS
600.0000 mg | ORAL_TABLET | Freq: Two times a day (BID) | ORAL | Status: DC
  Administered 2019-11-24 – 2019-11-25 (×4): 600 mg via ORAL
  Filled 2019-11-24 (×5): qty 1

## 2019-11-24 MED ORDER — SODIUM CHLORIDE 0.9 % IV BOLUS
250.0000 mL | Freq: Once | INTRAVENOUS | Status: AC
  Administered 2019-11-24: 250 mL via INTRAVENOUS

## 2019-11-24 MED ORDER — MEMANTINE HCL 5 MG PO TABS
5.0000 mg | ORAL_TABLET | Freq: Every day | ORAL | Status: DC
  Administered 2019-11-24: 5 mg via ORAL
  Filled 2019-11-24: qty 1

## 2019-11-24 MED ORDER — ASPIRIN EC 81 MG PO TBEC
81.0000 mg | DELAYED_RELEASE_TABLET | Freq: Every day | ORAL | Status: DC
  Administered 2019-11-24: 81 mg via ORAL
  Filled 2019-11-24: qty 1

## 2019-11-24 NOTE — ED Provider Notes (Signed)
[facility] Emergency Department Provider Note   ____________________________________________   First MD Initiated Contact with Patient 11/24/19 0100     (approximate)  I have reviewed the triage vital signs and the nursing notes.   HISTORY  Chief Complaint Fall  EM caveat: Dementia, daughter who is at the bedside and presents her self as his healthcare power of attorney as well gives history  HPI Damon [name] is a 86 y.o. male history of congestive heart failure, chronic kidney disease dementia, indwelling Foley catheter   Patient himself reports no pain or discomfort except some discomfort of the top of his left hand. Otherwise no pain. Specifically denies headache neck pain chest pain trouble breathing or abdominal pain.  Daughter reports that she was with him he was getting a meal ready to eat when he seemingly fell out of his chair forward striking a carpet. He did not pass out. She was with him when this occurred. She does report he has a lot of trouble with balance and she has to assist him with his walker and he seems to be slowly worsening over time. They have been working with hospice and are hoping for additional care or potentially placement as he continues to decline  No recent illnesses no fevers. His urine bag has not had abnormal odor and has been clear. He does eat and drink but relatively small amounts and has difficulty has to be assisted with feeding often    Past Medical History:  Diagnosis Date  . Arrhythmia    Baseline bradycardia  . Bradycardia   . CHF (congestive heart failure) ([place])   . Chronic kidney disease    [place] creatinine 1.4 to 1.5  . Dementia ([place])   . Diabetes mellitus   . Hard of hearing   . Hypertension     Patient Active Problem List   Diagnosis Date Noted  . Aortic atherosclerosis ([place]) 11/24/2018  . Urinary retention 07/15/2018  . DM type 2 with diabetic peripheral neuropathy ([place]) 04/07/2018  .  Bilateral leg edema 03/18/2018  . Sepsis ([place]) 03/03/2018  . 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 Visit for well man health check 08/07/2016  . Mood swings 10/11/2015  . Chronic renal disease 09/11/2013  . Hypoglycemia 09/11/2013  . Diastolic CHF ([place]) 40/10/2723  . Hyperlipidemia 09/09/2013  . Urge incontinence 11/19/2012  . Incomplete emptying of bladder 11/19/2012  . Enlarged prostate with lower urinary tract symptoms (LUTS) 11/19/2012  . Benign localized hyperplasia of prostate with urinary obstruction and other lower urinary tract symptoms (LUTS)(600.21) 11/19/2012  . 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 DIABETIC PERIPHERAL NEUROPATHY 12/04/2005  . OBESITY 12/04/2005  . Essential hypertension 12/04/2005    Past Surgical History:  Procedure Laterality Date  . COLONOSCOPY  01/01/2012   Procedure: COLONOSCOPY;  Surgeon: [name], MD;  [facility] ENDOSCOPY;  Service: Endoscopy;  Laterality: N/A;  . IR CATHETER TUBE CHANGE  05/16/2018  . IR CATHETER TUBE CHANGE  06/13/2018  . PARATHYROIDECTOMY    . TONSILLECTOMY      Prior to Admission medications   Medication Sig Start Date End Date Taking? Authorizing Provider  acetaminophen (TYLENOL) 325 MG tablet Take 650 mg by mouth every 6 (six) hours as needed. 06/12/19  Yes [provider]  ALPRAZolam Duanne Moron) 0.5 MG tablet Take 0.5 mg by mouth every 6 (six) hours as needed. 10/13/19  Yes [provider]  amLODipine (NORVASC) 5 MG tablet Take 1  tablet by mouth 2 (two) times daily.   Yes [provider]  aspirin 81 MG tablet Take 81 mg by mouth at bedtime.    Yes [provider]  cyanocobalamin 1000 MCG tablet Take 1,000 mcg by mouth daily.    Yes [provider]  finasteride (PROSCAR) 5 MG tablet Take 1 tablet (5 mg total) by mouth daily. 03/22/18  Yes Gladstone Lighter, MD  furosemide (LASIX) 20 MG tablet Take 20 mg by mouth 2 (two) times daily. 11/15/19   Yes [provider]  gemfibrozil (LOPID) 600 MG tablet TAKE 1 TABLET BY MOUTH TWICE DAILY BEFORE  MEALS 10/21/17  Yes Lucille Passy, MD  insulin NPH-regular Human (70-30) 100 UNIT/ML injection Inject 15 Units into the skin 2 (two) times daily with a meal. Patient taking differently: Inject 4-16 Units into the skin 2 (two) times daily with a meal. Take 16 in the morning and 4 units at bedtime 03/21/18 11/24/19 Yes Gladstone Lighter, MD  latanoprost (XALATAN) 0.005 % ophthalmic solution Place 1 drop into both eyes at bedtime. 04/08/18  Yes [provider]  LORazepam (ATIVAN) 0.5 MG tablet Take 0.5 mg by mouth every 6 (six) hours as needed. 11/09/19  Yes [provider]  memantine (NAMENDA) 10 MG tablet Take 10 mg by mouth at bedtime.  12/22/18  Yes [provider]  Morphine Sulfate (MORPHINE CONCENTRATE) 10 mg / 0.5 ml concentrated solution Take 30 mLs by mouth as needed. 06/15/19  Yes [provider]  Omega-3 Fatty Acids (FISH OIL PO) Take 1 capsule by mouth daily.   Yes [provider]  potassium chloride 20 MEQ TBCR Take 20 mEq by mouth daily. While on lasix 03/21/18  Yes Gladstone Lighter, MD  QUEtiapine (SEROQUEL) 50 MG tablet Take 50 mg by mouth in the morning and at bedtime. 11/09/19  Yes [provider]  tamsulosin (FLOMAX) 0.4 MG CAPS capsule Take 2 capsules (0.8 mg total) by mouth daily. 03/21/18  Yes Gladstone Lighter, MD  Vitamin D, Ergocalciferol, (DRISDOL) 50000 units CAPS capsule TAKE 1 CAPSULE BY MOUTH ONCE A WEEK Patient taking differently: Takes on Monday 09/09/17  Yes Lucille Passy, MD  cephALEXin (KEFLEX) 250 MG capsule Take 1 capsule (250 mg total) by mouth 2 (two) times daily for 7 days. 11/24/19 12/01/19  Delman Kitten, MD    Allergies Penicillins  Family History  Problem Relation Age of Onset  . Diabetes type II Mother   . Hypertension Mother   . Diabetes type II Father   . Hypertension Father     Social  History Social History   Tobacco Use  . Smoking status: Never Smoker  . Smokeless tobacco: Former Systems developer    Types: Chew  . Tobacco comment: chewing more than 40 years/chew 1PPD  Vaping Use  . Vaping Use: Never used  Substance Use Topics  . Alcohol use: No    Alcohol/week: 0.0 standard drinks  . Drug use: No    Review of Systems Constitutional: No fever/chills ENT: No neck pain Cardiovascular: Denies chest pain. Respiratory: Denies shortness of breath. Gastrointestinal: No abdominal pain.   Genitourinary: See HPI. Foley catheter Musculoskeletal: Negative for back pain. Some pain over the top of the left hand Skin: Negative for rash. Neurological: Negative for headaches, areas of focal weakness or numbness.  Daughter reports they noticed the skin was torn off his left hand. He did strike his head on the floor but did not seem to have any obvious injury  ____________________________________________  PHYSICAL EXAM:  VITAL SIGNS: ED Triage Vitals  Enc Vitals Group     BP 11/23/19 1843 (!) 88/64     Pulse Rate 11/23/19 1843 (!) 52     Resp 11/23/19 1843 16     Temp 11/23/19 1843 (!) 97.5 F (36.4 C)     Temp Source 11/23/19 1843 Axillary     SpO2 11/23/19 1843 92 %     Weight 11/23/19 1837 200 lb (90.7 kg)     Height 11/23/19 1837 5\' 6"  (1.676 m)     Head Circumference --      Peak Flow --      Pain Score 11/23/19 1837 0     Pain Loc --      Pain Edu? --      Excl. in [place]? --     Constitutional: Alert and oriented to self and hospital but not to year or situation. Well appearing and in no acute distress. Eyes: Conjunctivae are normal. Head: Atraumatic. Nose: No congestion/rhinnorhea. Mouth/Throat: Mucous membranes are moist. Neck: No stridor.  Cardiovascular: Normal rate, regular rhythm. Grossly normal heart sounds.  Good peripheral circulation. Respiratory: Normal respiratory effort.  No retractions. Lungs CTAB. Gastrointestinal: Soft and nontender. No  distention. Musculoskeletal: No lower extremity tenderness nor edema. Moves all extremities well 5 out of 5 strength though somewhat deconditioned in all extremities. Of note his left hand has a fairly large skin tear involving proximately 50% of the dorsal surface of the hand but no laceration. This was cleaned, debrided, and nonadherent dressing placed. He has good use of the fingers, full range of motion of all digits of the hand and normal capillary refill. Significant bruising over the dorsum of the left hand also noted. Neurologic:  Normal speech and language though somewhat flattened affect. No gross focal neurologic deficits are appreciated.  Skin:  Skin is warm, dry and intact. No rash noted. Psychiatric: Mood and affect are normal. Speech and behavior are normal.  ____________________________________________   LABS (all labs ordered are listed, but only abnormal results are displayed)  Labs Reviewed  CBC - Abnormal; Notable for the following components:      Result Value   Hemoglobin 17.5 (*)    All other components within normal limits  BASIC METABOLIC PANEL - Abnormal; Notable for the following components:   CO2 21 (*)    Glucose, Bld 192 (*)    BUN 35 (*)    Creatinine, Ser 1.94 (*)    GFR, Estimated 34 (*)    All other components within normal limits  BRAIN NATRIURETIC PEPTIDE - Abnormal; Notable for the following components:   B Natriuretic Peptide 591.9 (*)    All other components within normal limits  HEPATIC FUNCTION PANEL - Abnormal; Notable for the following components:   Bilirubin, Direct 0.3 (*)    All other components within normal limits  URINE CULTURE   ____________________________________________  EKG  Reviewed interpreted by me at 440 Heart rate 60 QRS 159 QTc 440 Some baseline artifact, probable sinus rhythm with left bundle  block ____________________________________________  RADIOLOGY  DG Chest 2 View  Result Date: 11/23/2019 CLINICAL DATA:   Fall. EXAM: CHEST - 2 VIEW COMPARISON:  MRI lumbar spine dated April 02, 2019. Chest x-ray dated August 26, 2018. FINDINGS: The heart size and mediastinal contours are within normal limits. Normal pulmonary vascularity. Unchanged mild linear scarring in the lingula and both lower lobes. No focal consolidation, pleural effusion, or pneumothorax. No acute osseous abnormality. Chronic L2  compression deformity. In IMPRESSION: No active cardiopulmonary disease. Electronically Signed   By: Titus Dubin [name]   On: 11/23/2019 20:04   CT Head Wo Contrast  Result Date: 11/23/2019 CLINICAL DATA:  Fall. EXAM: CT HEAD WITHOUT CONTRAST TECHNIQUE: Contiguous axial images were obtained from the base of the skull through the vertex without intravenous contrast. COMPARISON:  MRI brain dated May 22, 2019. CT head dated May 15, 2019. FINDINGS: Brain: No evidence of acute infarction, hemorrhage, hydrocephalus, extra-axial collection or mass lesion/mass effect. Stable atrophy and chronic microvascular ischemic changes. Vascular: Atherosclerotic vascular calcification of the carotid siphons. No hyperdense vessel. Skull: Normal. Negative for fracture or focal lesion. Sinuses/Orbits: No acute finding. Other: None. IMPRESSION: 1. No acute intracranial abnormality. Electronically Signed   By: Titus Dubin [name]   On: 11/23/2019 19:37   DG Hand Complete Left  Result Date: 11/24/2019 CLINICAL DATA:  Fall EXAM: LEFT HAND - COMPLETE 3+ VIEW COMPARISON:  None. FINDINGS: Advanced degenerative changes at the 1st carpometacarpal joint. Moderate degenerative changes throughout the IP joints. No acute bony abnormality. Specifically, no fracture, subluxation, or dislocation. Vascular calcifications. Soft tissues unremarkable. IMPRESSION: No acute bony abnormality. Electronically Signed   By: Rolm Baptise [name]   On: 11/24/2019 03:00    Imaging studies reviewed and summarized by me as below Left hand x-ray negative for acute  fracture CT head negative for acute injury. Chest x-ray negative for acute cardiopulmonary findings. Chronic L2 compression ____________________________________________   PROCEDURES  Procedure(s) performed: None  Procedures  Critical Care performed: No  ____________________________________________   INITIAL IMPRESSION / ASSESSMENT AND PLAN / ED COURSE  Pertinent labs & imaging results that were available during my care of the patient were reviewed by me and considered in my medical decision making (see chart for details).   Patient presents for a fall, based on the clinical history provided by daughter it appears is likely mechanical or due to poor balance. Does not have any acute change other than as slow and insidious decrease in strength in his cognition over many weeks and months of time. He has engaged hospice but currently does not have active 24-hour care but does have occasional nursing visits. Additionally, discussed with his daughter and it seems goals of care primarily guided towards comfort. His evaluation today including labs demonstrate a mild increase in his creatinine mild elevation of BNP but chest x-ray is clear without respiratory distress. I will send urine culture has indwelling Foley but no obvious urinary tract infection symptoms. Reassuring imaging today  Clinical Course as of Nov 24 734  Tue Nov 24, 2019  0252 Discussed with Jed Limerick)   [MQ]  [phone] Daughter in room speaking with [facility] team   [MQ]  [phone] Daughter thought the patient was under the care of [facility] hospice, but it turns out after discussion with their team that he is under the care of a different hospice service. I have placed a consult to case management, discussed with the daughter and she would prefer and I think quite reasonable that we will have him board in the ER to have case management consult and coordinate need for appropriate disposition in conjunction with his hospice  service   [MQ]    Clinical Course User Index [MQ] Delman Kitten, MD   ----------------------------------------- 7:37 AM on 11/24/2019 -----------------------------------------  Patient has rested comfortably throughout the evening.  Ongoing care assigned to Dr. Su Hoff, follow-up on case management/physical therapy consultations for further disposition recommendations and connection with hospice  services (which daughter reports patient is followed by already)  ____________________________________________   FINAL CLINICAL IMPRESSION(S) / ED DIAGNOSES  Final diagnoses:  Fall, initial encounter  Skin tear of left hand without complication, initial encounter        Note:  This document was prepared using Systems analyst and may include unintentional dictation errors       Delman Kitten, MD 11/24/19 [phone]

## 2019-11-24 NOTE — ED Notes (Signed)
Resumed care from reed rn.  Pt alert, meds given., pt had soft brown stool.  Pt cleaned up.  Pt had approx 500 ccc urine in urostomy bag.  nsr on monitor.  Pt eating and drinking fluids.  Pt alert

## 2019-11-24 NOTE — Evaluation (Signed)
Physical Therapy Evaluation Patient Details Name: Damon [name] MRN: [id number] DOB: Jun 01, 1933 Today's Date: 11/24/2019   History of Present Illness   86 y.o. male history of congestive heart failure, chronic kidney disease dementia, indwelling Foley catheter.  He has recnetly had increased confusion and weakness, multiple recent falls including the one that brought him in.  Clinical Impression  Pt with baseline dementia some most interaction/questioning through daughter who was in the room.  She reports that she has to assist him with all mobility and ADLs, and that he has been even weaker and more confused lately.  Overall he was very limited and while PT could use direct assist to get him to sitting and encourage standing attempt he was too limited to really do much on his own at all.  Pt very unsafe with attempt at standing and could not attain upright even with +2 (daughter also helping with getting to standing, per baseline and for pt 'situational comfort').  Pt is not safe to return home and at this timer will need a higher level of care than daughter is able to give.      Follow Up Recommendations SNF;Supervision/Assistance - 24 hour    Equipment Recommendations  None recommended by PT    Recommendations for Other Services       Precautions / Restrictions Precautions Precautions: Fall Restrictions Weight Bearing Restrictions: No      Mobility  Bed Mobility Overal bed mobility: Needs Assistance Bed Mobility: Supine to Sit;Sit to Supine     Supine to sit: Mod assist;Max assist Sit to supine: Max assist   General bed mobility comments: Pt hesitant to participate much, did show some effort with physical and verbal encouragement    Transfers Overall transfer level: Needs assistance Equipment used: Rolling walker (2 wheeled) Transfers: Sit to/from Stand Sit to Stand: Max assist         General transfer comment: Pt struggled to attain actual upright at EOB.   Reliant  on the walker/UEs and leaning back of legs on bed.  Unable to shift weight forward appropriately and assisted back to bed  Ambulation/Gait             General Gait Details: unsafe/unable to try  Stairs            Wheelchair Mobility    Modified Rankin (Stroke Patients Only)       Balance Overall balance assessment: Needs assistance Sitting-balance support: Single extremity supported Sitting balance-Leahy Scale: Fair Sitting balance - Comments: Pt confused, but able tomaintain sitting with only light CGA   Standing balance support: Bilateral upper extremity supported Standing balance-Leahy Scale: Zero Standing balance comment: unable to attain standing, feet slipping forward, poor awareness and safety                             Pertinent Vitals/Pain Pain Assessment: Faces Faces Pain Scale: Hurts a little bit Pain Location: Daughter reports he rarely c/o pain, today "everything" hurts, even with questioning he cannot/would not expand on this    Home Living Family/patient expects to be discharged to:: Skilled nursing facility                      Prior Function Level of Independence: Needs assistance   Gait / Transfers Assistance Needed: Pt struggles to walk the 15-20 ft to get out of his room to recliner.  Does not walk to bathroom/dining area anymore.  Uses FWW with all mobility.  ADL's / Homemaking Assistance Needed: daughter helps with all tasks, including changing diapers        Hand Dominance        Extremity/Trunk Assessment   Upper Extremity Assessment Upper Extremity Assessment: Difficult to assess due to impaired cognition;Generalized weakness    Lower Extremity Assessment Lower Extremity Assessment: Difficult to assess due to impaired cognition;Generalized weakness       Communication   Communication: No difficulties  Cognition Arousal/Alertness: Awake/alert Behavior During Therapy: Restless;Agitated Overall  Cognitive Status: History of cognitive impairments - at baseline                                 General Comments: Pt with baseline confusion that is significant, per daughter even more disoriented now       General Comments General comments (skin integrity, edema, etc.): Pt confused t/o session, struggled to participate consistently    Exercises     Assessment/Plan    PT Assessment Patient needs continued PT services  PT Problem List Decreased strength;Decreased range of motion;Decreased activity tolerance;Decreased balance;Decreased mobility;Decreased cognition;Decreased knowledge of use of DME;Decreased safety awareness       PT Treatment Interventions DME instruction;Gait training;Functional mobility training;Therapeutic activities;Therapeutic exercise;Balance training;Cognitive remediation;Neuromuscular re-education;Patient/family education    PT Goals (Current goals can be found in the Care Plan section)  Acute Rehab PT Goals Patient Stated Goal: go home PT Goal Formulation: With patient Time For Goal Achievement: 11/24/19 Potential to Achieve Goals: Fair    Frequency Min 2X/week   Barriers to discharge        Co-evaluation               AM-PAC PT "6 Clicks" Mobility  Outcome Measure Help needed turning from your back to your side while in a flat bed without using bedrails?: A Lot Help needed moving from lying on your back to sitting on the side of a flat bed without using bedrails?: Total Help needed moving to and from a bed to a chair (including a wheelchair)?: Total Help needed standing up from a chair using your arms (e.g., wheelchair or bedside chair)?: Total Help needed to walk in hospital room?: Total Help needed climbing 3-5 steps with a railing? : Total 6 Click Score: 7    End of Session Equipment Utilized During Treatment: Gait belt Activity Tolerance: Other (comment) (limited due to confusion and weakness) Patient left: in bed;with  call bell/phone within reach;with family/visitor present   PT Visit Diagnosis: Muscle weakness (generalized) (M62.81);Difficulty in walking, not elsewhere classified (R26.2);Unsteadiness on feet (R26.81)    Time: 0383-3383 PT Time Calculation (min) (ACUTE ONLY): 25 min   Charges:   PT Evaluation $PT Eval Low Complexity: 1 Low PT Treatments $Therapeutic Activity: 8-22 mins        Kreg Shropshire, DPT 11/24/2019, 9:40 AM

## 2019-11-24 NOTE — ED Notes (Signed)
Patient resting comfortably, respirations even and unlabored. NAD noted.

## 2019-11-24 NOTE — ED Notes (Signed)
Pt working with PT  

## 2019-11-24 NOTE — ED Notes (Signed)
Levada Dy (Daughter)  [phone]

## 2019-11-24 NOTE — Progress Notes (Addendum)
Inpatient Diabetes Program Recommendations  AACE/ADA: New Consensus Statement on Inpatient Glycemic Control  Target Ranges:  Prepandial:   less than 140 mg/dL      Peak postprandial:   less than 180 mg/dL (1-2 hours)      Critically ill patients:  140 - 180 mg/dL   Results for DARTANIAN, KNAGGS (MRN [id number]) as of 11/24/2019 08:03  Ref. Range 11/24/2019 08:00  Glucose-Capillary Latest Ref Range: 70 - 99 mg/dL 137 (H)  Results for JOHNMARK, GEIGER (MRN [id number]) as of 11/24/2019 08:03  Ref. Range 11/23/2019 18:51  Glucose Latest Ref Range: 70 - 99 mg/dL 192 (H)   Review of Glycemic Control  Diabetes history: DM2 Outpatient Diabetes medications: 70/30 16 units QAM, 70/30 4 units QHS Current orders for Inpatient glycemic control: Novolog 0-15 units TID with meals  Inpatient Diabetes Program Recommendations:    Insulin: Please consider ordering Novolog 0-5 units QHS for bedtime correction.  NOTE: Noted consult. Chart reviewed. Patient sees [name] (Endocrinologist) as an outpatient and was last seen 05/19/19. Per office note by [name] on 05/19/19, patient was prescribed 70/30 32 units QAM, 70/30 8 units QPM, Ozempic 0.5 mg Qweek.  Fasting glucose 137 mg/dl this am.  Diabetes coordinator will follow glucose trends while boarding in ER and make further recommendations if needed.  Thanks, Barnie Alderman, RN, MSN, CDE Diabetes Coordinator Inpatient Diabetes Program [phone] (Team Pager from 8am to 5pm)

## 2019-11-24 NOTE — ED Notes (Signed)
Meal tray did not arrive for patient. Given box lunch

## 2019-11-25 LAB — RESPIRATORY PANEL BY RT PCR (FLU A&B, COVID)
Influenza A by PCR: NEGATIVE
Influenza B by PCR: NEGATIVE
SARS Coronavirus 2 by RT PCR: NEGATIVE

## 2019-11-25 LAB — CBG MONITORING, ED
Glucose-Capillary: 258 mg/dL — ABNORMAL HIGH (ref 70–99)
Glucose-Capillary: 316 mg/dL — ABNORMAL HIGH (ref 70–99)
Glucose-Capillary: 119 mg/dL — ABNORMAL HIGH (ref 70–99)

## 2019-11-25 NOTE — NC FL2 (Signed)
[place] LEVEL OF CARE SCREENING TOOL     IDENTIFICATION  Patient Name: Damon [name] Birthdate: 23-Apr-1933 Sex: male Admission Date (Current Location): 11/24/2019  [place] and [place] Number:  Engineering geologist and Address:  [facility], [address], [place], [place] [address]      Provider Number: [id number]  Attending Physician Name and Address:  No att. providers found  Relative Name and Phone Number:  Knox Saliva [phone]    Current Level of Care: Hospital Recommended Level of Care: [place] Prior Approval Number:    Date Approved/Denied:   PASRR Number: [id number]  Discharge Plan:  ([place])    Current Diagnoses: Patient Active Problem List   Diagnosis Date Noted  . Aortic atherosclerosis ([place]) 11/24/2018  . Urinary retention 07/15/2018  . DM type 2 with diabetic peripheral neuropathy ([place]) 04/07/2018  . Bilateral leg edema 03/18/2018  . Sepsis ([place]) 03/03/2018  . 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 Visit for well man health check 08/07/2016  . Mood swings 10/11/2015  . Chronic renal disease 09/11/2013  . Hypoglycemia 09/11/2013  . Diastolic CHF ([place]) 65/68/1275  . Hyperlipidemia 09/09/2013  . Urge incontinence 11/19/2012  . Incomplete emptying of bladder 11/19/2012  . Enlarged prostate with lower urinary tract symptoms (LUTS) 11/19/2012  . Benign localized hyperplasia of prostate with urinary obstruction and other lower urinary tract symptoms (LUTS)(600.21) 11/19/2012  . 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 DIABETIC PERIPHERAL NEUROPATHY 12/04/2005  . OBESITY 12/04/2005  . Essential hypertension 12/04/2005    Orientation RESPIRATION BLADDER Height & Weight     Self, Situation  Normal Incontinent Weight: 90.7 kg Height:  5\' 6"  (167.6 cm)  BEHAVIORAL SYMPTOMS/MOOD NEUROLOGICAL BOWEL NUTRITION STATUS      Continent Diet (Regular)  AMBULATORY STATUS  COMMUNICATION OF NEEDS Skin   Limited Assist Verbally                         Personal Care Assistance Level of Assistance  Bathing, Dressing Bathing Assistance: Limited assistance   Dressing Assistance: Limited assistance     Functional Limitations Info  Hearing   Hearing Info: Impaired (HOH)      SPECIAL CARE FACTORS FREQUENCY                       Contractures Contractures Info: Not present    Additional Factors Info  Code Status, Allergies Code Status Info: DNR Allergies Info: Penicillin           Current Medications (11/25/2019):  This is the current hospital active medication list Current Facility-Administered Medications  Medication Dose Route Frequency Provider Last Rate Last Admin  . ALPRAZolam Duanne Moron) tablet 0.5 mg  0.5 mg Oral Q6H PRN Delman Kitten, MD      . amLODipine (NORVASC) tablet 2.5 mg  2.5 mg Oral BID Delman Kitten, MD   2.5 mg at 11/24/19 2311  . aspirin EC tablet 81 mg  81 mg Oral QHS Delman Kitten, MD   81 mg at 11/24/19 2312  . cephALEXin (KEFLEX) capsule 250 mg  250 mg Oral Q8H Carrie Mew, MD   250 mg at 11/24/19 2311  . finasteride (PROSCAR) tablet 5 mg  5 mg Oral Daily Delman Kitten, MD   5 mg at 11/24/19 1009  . furosemide (LASIX) tablet 20 mg  20 mg Oral BID Delman Kitten, MD   20 mg at 11/24/19 2312  .  gemfibrozil (LOPID) tablet 600 mg  600 mg Oral BID AC Delman Kitten, MD   600 mg at 11/25/19 0848  . insulin aspart (novoLOG) injection 0-15 Units  0-15 Units Subcutaneous TID WC Delman Kitten, MD   2 Units at 11/24/19 1925  . latanoprost (XALATAN) 0.005 % ophthalmic solution 1 drop  1 drop Both Eyes QHS Delman Kitten, MD      . memantine [facility]) tablet 5 mg  5 mg Oral QHS Delman Kitten, MD   5 mg at 11/24/19 2314  . potassium chloride SA (KLOR-CON) CR tablet 20 mEq  20 mEq Oral Daily Delman Kitten, MD   20 mEq at 11/24/19 1011  . QUEtiapine (SEROQUEL) tablet 25 mg  25 mg Oral BID Delman Kitten, MD   25 mg at 11/24/19 2314  . sodium chloride  0.9 % bolus 250 mL  250 mL Intravenous Once Delman Kitten, MD      . tamsulosin (FLOMAX) capsule 0.8 mg  0.8 mg Oral Daily Delman Kitten, MD   0.8 mg at 11/24/19 1011  . vitamin B-12 (CYANOCOBALAMIN) tablet 1,000 mcg  1,000 mcg Oral Daily Delman Kitten, MD   1,000 mcg at 11/24/19 1010   Current Outpatient Medications  Medication Sig Dispense Refill  . acetaminophen (TYLENOL) 325 MG tablet Take 650 mg by mouth every 6 (six) hours as needed.    . ALPRAZolam (XANAX) 0.5 MG tablet Take 0.5 mg by mouth every 6 (six) hours as needed.    Marland Kitchen amLODipine (NORVASC) 5 MG tablet Take 1 tablet by mouth 2 (two) times daily.    Marland Kitchen aspirin 81 MG tablet Take 81 mg by mouth at bedtime.     . cyanocobalamin 1000 MCG tablet Take 1,000 mcg by mouth daily.     . finasteride (PROSCAR) 5 MG tablet Take 1 tablet (5 mg total) by mouth daily. 30 tablet 2  . furosemide (LASIX) 20 MG tablet Take 20 mg by mouth 2 (two) times daily.    Marland Kitchen gemfibrozil (LOPID) 600 MG tablet TAKE 1 TABLET BY MOUTH TWICE DAILY BEFORE  MEALS 180 tablet 0  . insulin NPH-regular Human (70-30) 100 UNIT/ML injection Inject 15 Units into the skin 2 (two) times daily with a meal. (Patient taking differently: Inject 4-16 Units into the skin 2 (two) times daily with a meal. Take 16 in the morning and 4 units at bedtime) 9 mL 3  . latanoprost (XALATAN) 0.005 % ophthalmic solution Place 1 drop into both eyes at bedtime.    Marland Kitchen LORazepam (ATIVAN) 0.5 MG tablet Take 0.5 mg by mouth every 6 (six) hours as needed.    . memantine (NAMENDA) 10 MG tablet Take 10 mg by mouth at bedtime.     . Morphine Sulfate (MORPHINE CONCENTRATE) 10 mg / 0.5 ml concentrated solution Take 30 mLs by mouth as needed.    . Omega-3 Fatty Acids (FISH OIL PO) Take 1 capsule by mouth daily.    . potassium chloride 20 MEQ TBCR Take 20 mEq by mouth daily. While on lasix 30 tablet 2  . QUEtiapine (SEROQUEL) 50 MG tablet Take 50 mg by mouth in the morning and at bedtime.    . tamsulosin (FLOMAX) 0.4 MG  CAPS capsule Take 2 capsules (0.8 mg total) by mouth daily. 30 capsule 2  . Vitamin D, Ergocalciferol, (DRISDOL) 50000 units CAPS capsule TAKE 1 CAPSULE BY MOUTH ONCE A WEEK (Patient taking differently: Takes on Monday) 12 capsule 3  . cephALEXin (KEFLEX) 250 MG capsule Take 1  capsule (250 mg total) by mouth 2 (two) times daily for 7 days. 14 capsule 0     Discharge Medications: Please see discharge summary for a list of discharge medications.  Relevant Imaging Results:  Relevant Lab Results:   Additional Information [id number]  [place], [place]

## 2019-11-25 NOTE — TOC Initial Note (Signed)
Transition of Care [facility]) - Initial/Assessment Note    Patient Details  Name: MEREL SANTOLI MRN: [id number] Date of Birth: 1933/06/07  Transition of Care [facility]) CM/SW Contact:    Victorino Dike, RN Phone Number: 11/25/2019, 10:35 AM  Expected Discharge Plan: Long Term Acute Care (LTAC) Barriers to Discharge: SNF Pending Medicaid   Patient Goals and CMS Choice Patient states their goals for this hospitalization and ongoing recovery are:: Daughter is searching for LTC with hospice for patient CMS Medicare.gov Compare Post Acute Care list provided to:: Patient Represenative (must comment) Choice offered to / list presented to : Adult Children  Expected Discharge Plan and Services Expected Discharge Plan: Long Term Acute Care (LTAC)   Discharge Planning Services: CM Consult Post Acute Care Choice: Nursing Home Living arrangements for the past 2 months: Single Family Home                   Prior Living Arrangements/Services Living arrangements for the past 2 months: Single Family Home Lives with:: Self, Adult Children         Emotional Assessment       Orientation: : Oriented to Self, Oriented to Situation Alcohol / Substance Use: Not Applicable Psych Involvement: No (comment)  Admission diagnosis:  mecha. Fall, AMS Patient Active Problem List   Diagnosis Date Noted  . Aortic atherosclerosis ([place]) 11/24/2018  . Urinary retention 07/15/2018  . DM type 2 with diabetic peripheral neuropathy ([place]) 04/07/2018  . Bilateral leg edema 03/18/2018  . Sepsis ([place]) 03/03/2018  . 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 Visit for well man health check 08/07/2016  . Mood swings 10/11/2015  . Chronic renal disease 09/11/2013  . Hypoglycemia 09/11/2013  . Diastolic CHF ([place]) 57/50/5183  . Hyperlipidemia 09/09/2013  . Urge incontinence 11/19/2012  . Incomplete emptying of bladder 11/19/2012  . Enlarged prostate with lower urinary tract symptoms (LUTS) 11/19/2012  . Benign localized  hyperplasia of prostate with urinary obstruction and other lower urinary tract symptoms (LUTS)(600.21) 11/19/2012  . 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 DIABETIC PERIPHERAL NEUROPATHY 12/04/2005  . OBESITY 12/04/2005  . Essential hypertension 12/04/2005   PCP:  Baxter Hire, MD Pharmacy:   [facility] [address] [place] [address] [place] [address] Phone: [phone] Fax: [phone]   Readmission Risk Interventions No flowsheet data found.

## 2019-11-25 NOTE — ED Notes (Signed)
Daughter at bedside. Dietary called for breakfast tray.

## 2019-11-25 NOTE — ED Notes (Signed)
Report off to tom rn 

## 2019-11-25 NOTE — ED Notes (Signed)
C-COM called for transport to [facility]

## 2019-11-25 NOTE — TOC Progression Note (Signed)
Transition of Care [facility]) - Progression Note    Patient Details  Name: Damon [name] MRN: [id number] Date of Birth: 12-Nov-1933  Transition of Care [facility]) CM/SW [place], RN Phone Number: 11/25/2019, 11:12 AM  Clinical Narrative:     Spoke with daughter Levada Dy today and she has now received a name for her dad, Damon [name], for Medicaid Caseworker Guss Bunde Hollister  [phone]).  Left voicemail with Neoma Laming, Admissions coordinator at [facility] to check on possible admission for LTC.    FL2 completed and sent to [facility].    Expected Discharge Plan: Long Term Acute Care (LTAC) Barriers to Discharge: SNF Pending Medicaid  Expected Discharge Plan and Services Expected Discharge Plan: Long Term Acute Care (LTAC)   Discharge Planning Services: CM Consult Post Acute Care Choice: Nursing Home Living arrangements for the past 2 months: Single Family Home                                       Social Determinants of Health (SDOH) Interventions    Readmission Risk Interventions No flowsheet data found.

## 2019-11-25 NOTE — ED Notes (Signed)
Foley bag emptied of 900 ml of straw colored urine

## 2019-11-25 NOTE — ED Provider Notes (Signed)
-----------------------------------------   6:31 AM on 11/25/2019 -----------------------------------------   Blood pressure 131/62, pulse (!) 56, temperature 97.9 F (36.6 C), temperature source Oral, resp. rate 14, height 5\' 6"  (1.676 m), weight 90.7 kg, SpO2 97 %.  The patient is resting at this time.  There have been no acute events since the last update.  Awaiting disposition plan from Social Work team.   Paulette Blanch, MD 11/25/19 [phone]

## 2019-11-25 NOTE — ED Notes (Signed)
Report to Mickel Baas at [facility] a spt is a new admission to their facility.

## 2019-11-25 NOTE — ED Notes (Signed)
Pt resting comfortably at this time. Daughter brought pt breakfast due to no breakfast tray delivered, dietary contacted by this RN.

## 2019-11-25 NOTE — ED Notes (Signed)
Pt resting quietly at this time. Pt given water per request. No distress noted. Room cleaned up and decluttered. No distress noted. Call bell in reach. VSS.

## 2019-11-30 ENCOUNTER — Other Ambulatory Visit: Payer: Self-pay

## 2019-11-30 NOTE — Patient Outreach (Signed)
  [facility]) Care Management Chronic Special Needs Program  11/30/2019  Name: Damon [name] DOB: 04/05/1933  MRN: [id number]  Mr. Damon [name] is enrolled in a chronic special needs plan for Diabetes. RNCM called to follow up, assess, review and updat care plan. RNCM spoke with client's daughter/caregiver/designated party release.  HRA completed.  Subjective:  Per Damon [name], client now resides at [facility] skilled care facility permanently. She reports, client has been there about a week. She also states client continues to be followed by Hospice while at the facility.   Goals Addressed            This Visit's Progress   . COMPLETED: HEMOGLOBIN A1C < 7       A1C 6.3 on 11/24/2019 and A1C 7.2 on 05/08/2019.   Continue Diabetes self management actions:  Glucose monitoring per provider's recommendation.  Eat Healthy: low carbohydrate, low salt meals, watch portion sizes and avoid sugar sweetened drinks.  Visit provider per your provider's recommendation.  Hbg A1C level per your provider's recommendation.  Take medications as recommended.  Check blood sugar as recommended by provider.    . Patient Stated: symptoms management of disease processes within the next 6 months.   On track    Currently residing at [facility] skilled facility. Keep open lines of communication with [facility] staff. Call staff including social worker as needed. Call [facility] for questions, concerns as needed. Follow up with provider for questions or concerns as indicated.    . Patient/Caregiver Stated: Physical and Spiritual needs met within the next 6 months.   On track    Per daughter, Client currently resides at [facility] skilled facility with Hospice following. Encouraged to continue to seek counsel with [facility] as needed.      Plan: RNCM will send updated care plan to client; send updated care plan to primary care. Next outreach per tier level within the next 6 months.  [place] Management will continue to provide services for this member through 01/22/2020. [facility] [facility] Care Management Team will assume care 01/23/2020.   Damon Silversmith, RN, MSN, [place] [phone]

## 2019-12-09 DIAGNOSIS — R82991 Hypocitraturia: Secondary | ICD-10-CM | POA: Diagnosis not present

## 2020-01-04 ENCOUNTER — Encounter (INDEPENDENT_AMBULATORY_CARE_PROVIDER_SITE_OTHER): Payer: HMO | Admitting: Ophthalmology

## 2020-01-05 ENCOUNTER — Other Ambulatory Visit: Payer: Self-pay

## 2020-01-05 NOTE — Patient Outreach (Signed)
  [facility]) Care Management Chronic Special Needs Program    01/05/2020  Name: Damon [name], DOB: 1933/06/21  MRN: [id number]   Mr. Damon [name] is enrolled in a chronic special needs plan for Diabetes. [place] Management will continue to provide services for this member through 01/22/2020. [facility] Care Management Team will assume care 01/23/2020.  [name], RN, MSN, [place] [phone]

## 2020-01-26 ENCOUNTER — Other Ambulatory Visit: Payer: Self-pay

## 2020-03-22 IMAGING — CT CT HEAD W/O CM
1 series · 16 of 30 positions shown, 20 images · non-contrast
Comparison: MRI 01/13/2012, CT 01/08/2012

CLINICAL DATA: Left-sided weakness

EXAM:
CT HEAD WITHOUT CONTRAST
TECHNIQUE: Contiguous axial images were obtained from the base of the skull
through the vertex without intravenous contrast.

[Series 3: head wo · axial · 0.49mm/px · z∈[-57,+78]mm · 16 of 31 slices shown, 20 images]
[im 2/31  brain]
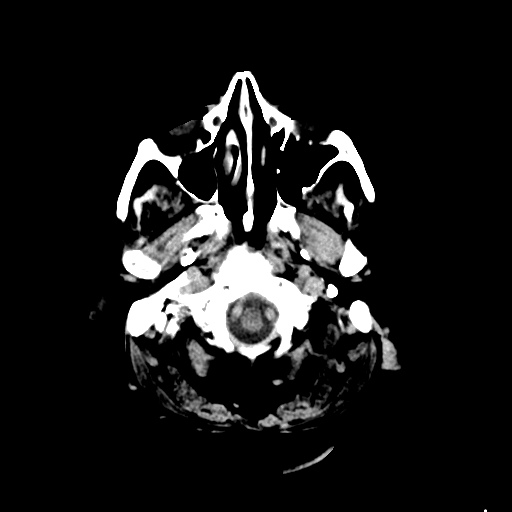
[im 2/31  bone]
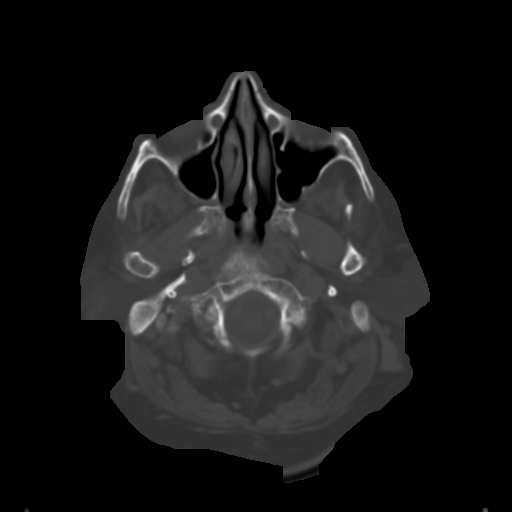
[im 4/31  brain]
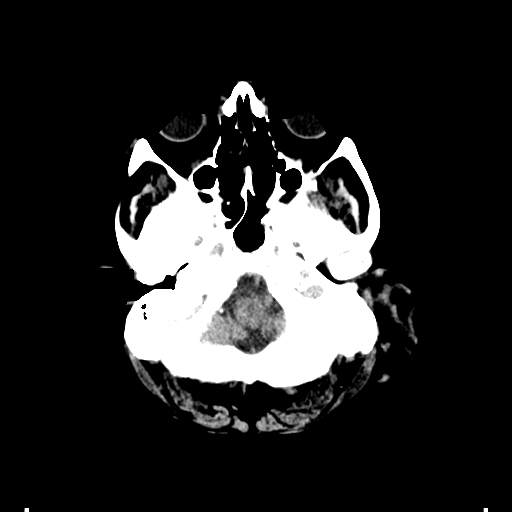
[im 6/31  brain]
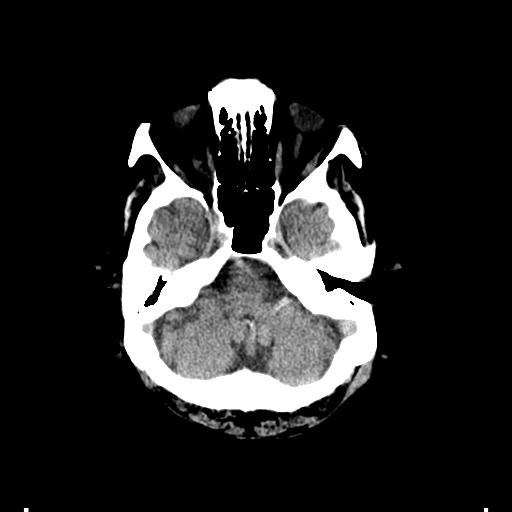
[im 8/31  brain]
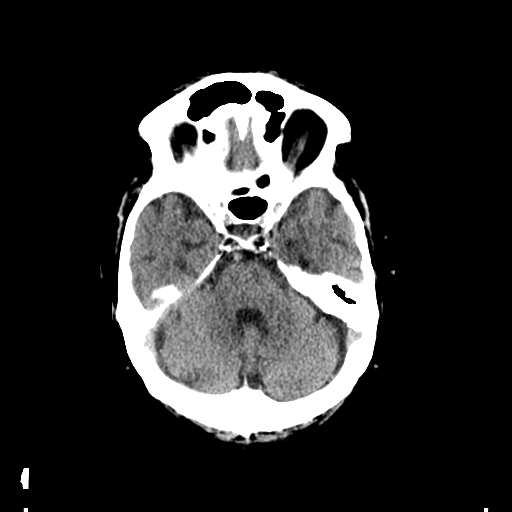
[im 9/31  brain]
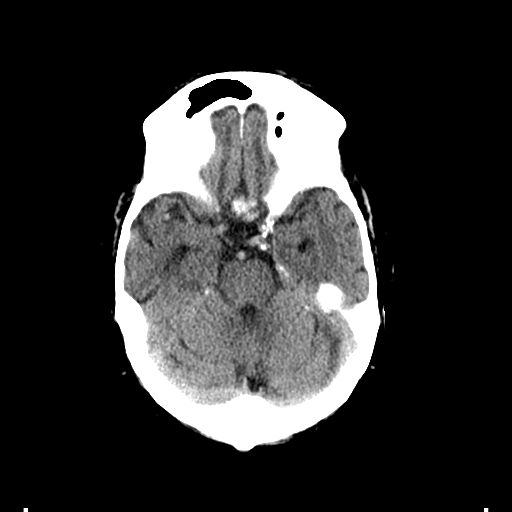
[im 9/31  bone]
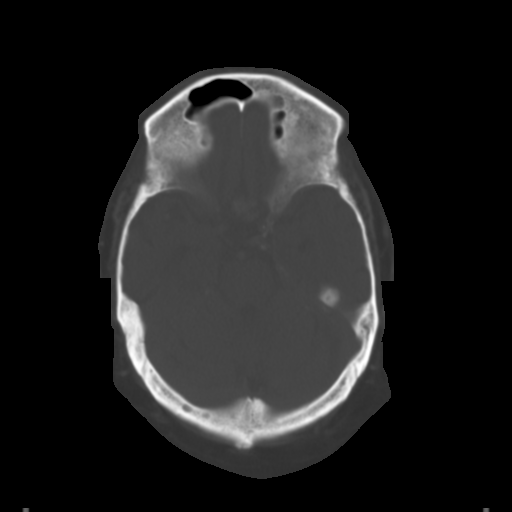
[im 11/31  brain]
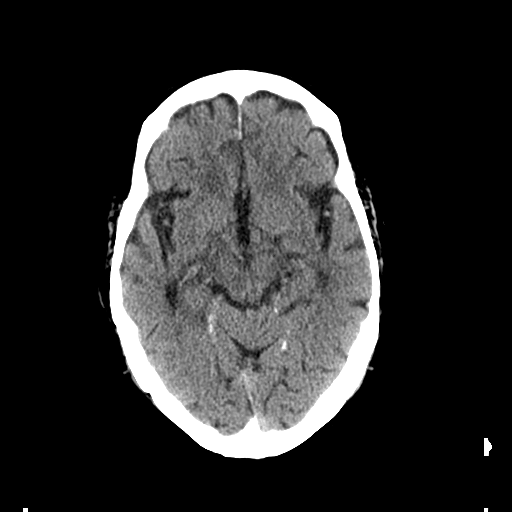
[im 13/31  brain]
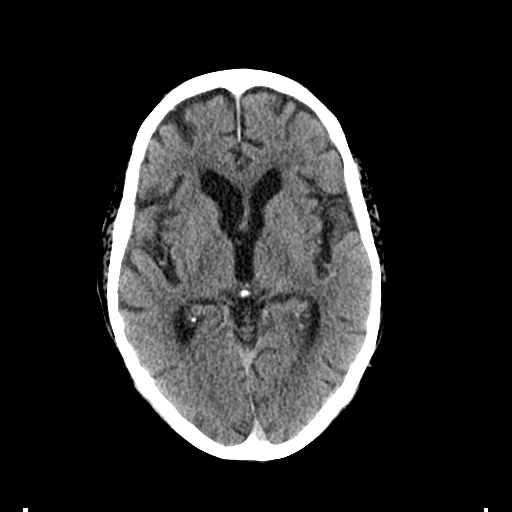
[im 15/31  brain]
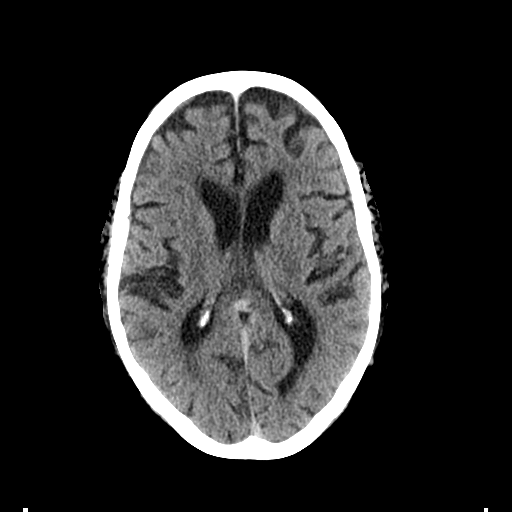
[im 16/31  brain]
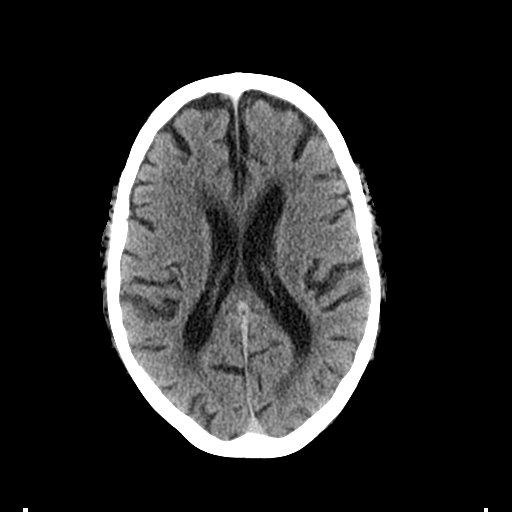
[im 16/31  bone]
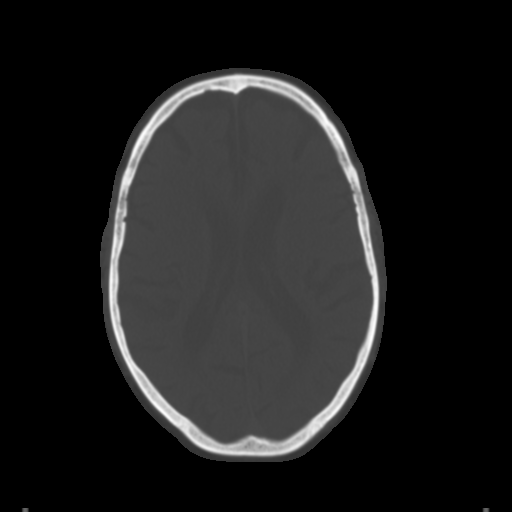
[im 18/31  brain]
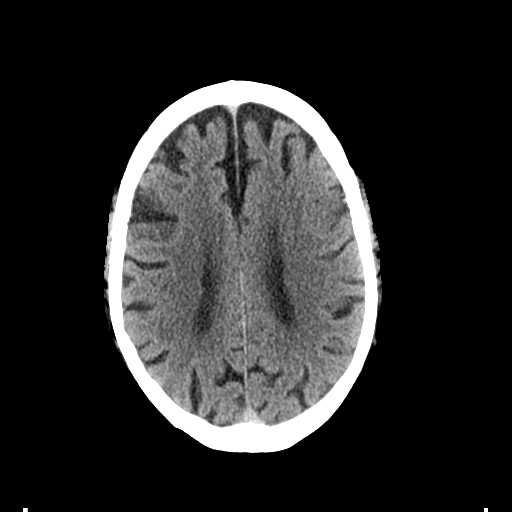
[im 20/31  brain]
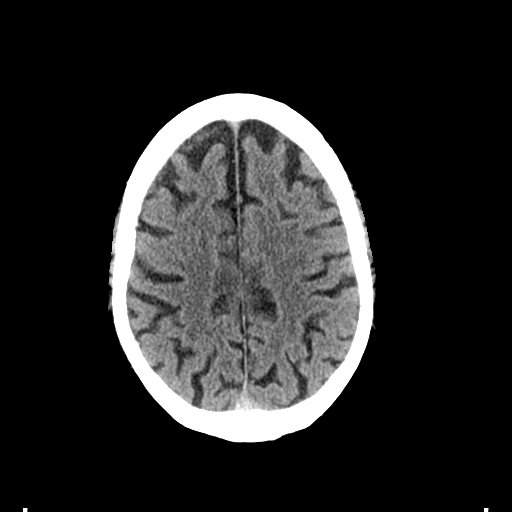
[im 22/31  brain]
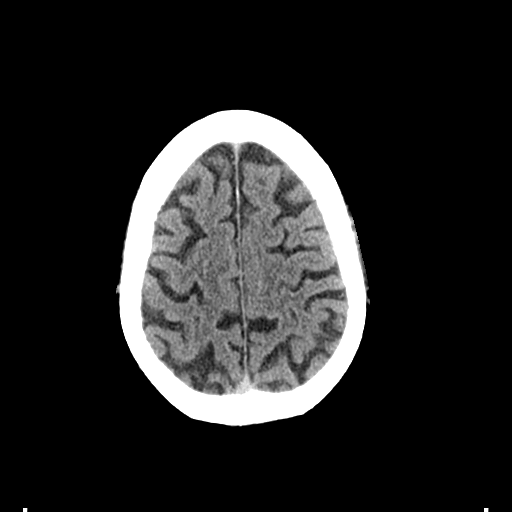
[im 23/31  brain]
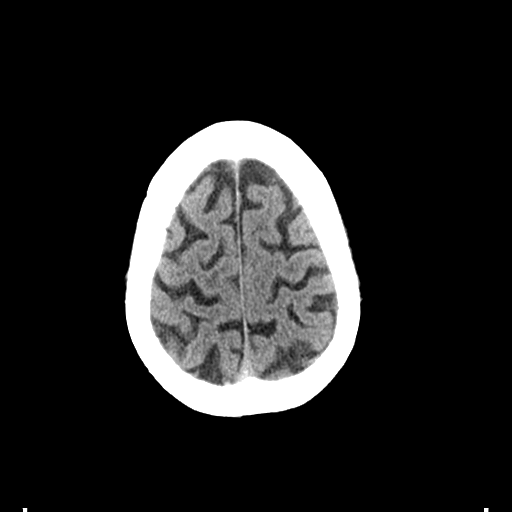
[im 23/31  bone]
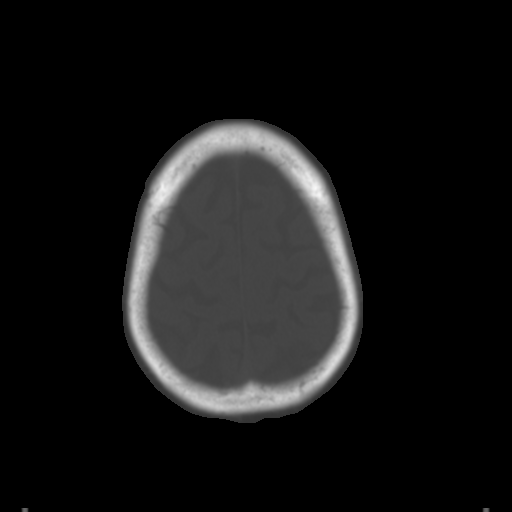
[im 25/31  brain]
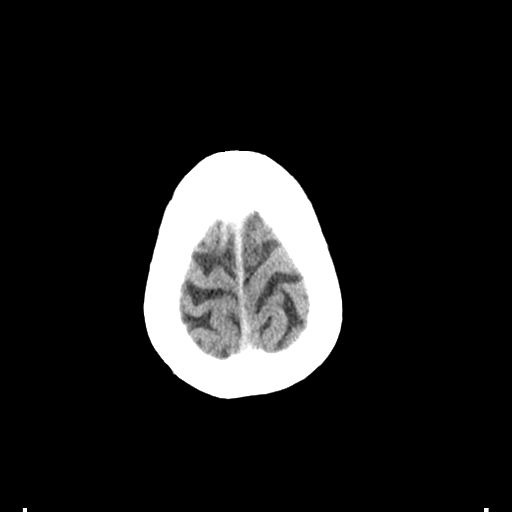
[im 27/31  brain]
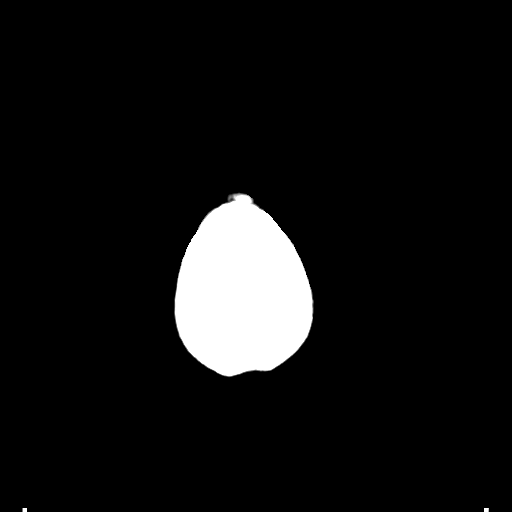
[im 29/31  brain]
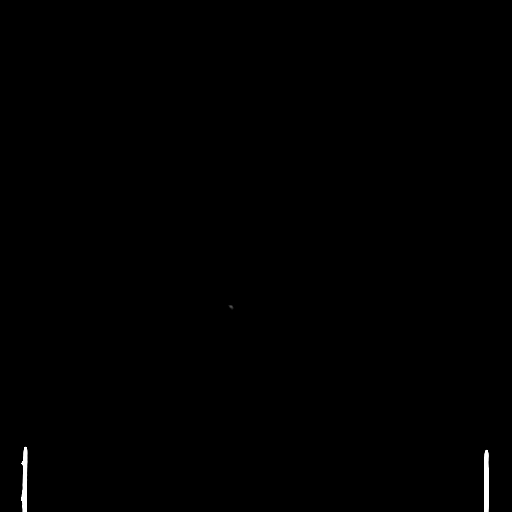

[16 of 30 positions shown; findings below may reference images not displayed]

FINDINGS: Brain: Moderate atrophy without hydrocephalus. Negative for acute
infarct, hemorrhage or mass. No midline shift.

Vascular: Negative for hyperdense vessel

Skull: Negative for fracture

Sinuses/Orbits: Bilateral cataract surgery. Paranasal sinuses clear.

Other: None
IMPRESSION: Generalized atrophy without acute abnormality

## 2020-04-25 ENCOUNTER — Other Ambulatory Visit
Admission: RE | Admit: 2020-04-25 | Discharge: 2020-04-25 | Disposition: A | Discharge status: Home / Self Care | Source: Ambulatory Visit | Attending: Family Medicine | Admitting: Family Medicine

## 2020-04-25 DIAGNOSIS — R4182 Altered mental status, unspecified: Secondary | ICD-10-CM | POA: Insufficient documentation

## 2020-04-25 LAB — URINALYSIS, ROUTINE W REFLEX MICROSCOPIC
Bilirubin Urine: NEGATIVE
Glucose, UA: NEGATIVE mg/dL
Ketones, ur: NEGATIVE mg/dL
Nitrite: NEGATIVE
Protein, ur: 30 mg/dL — AB
RBC / HPF: >50 RBC/hpf — ABNORMAL HIGH (ref 0–5)
Specific Gravity, Urine: 1.014 (ref 1.005–1.030)
WBC, UA: >50 WBC/hpf — ABNORMAL HIGH (ref 0–5)
pH: 5 (ref 5.0–8.0)

## 2020-04-28 LAB — URINE CULTURE: Culture: >=100000 — AB

## 2020-06-09 ENCOUNTER — Other Ambulatory Visit
Admission: RE | Admit: 2020-06-09 | Discharge: 2020-06-09 | Disposition: A | Discharge status: Home / Self Care | Source: Ambulatory Visit | Attending: Family Medicine | Admitting: Family Medicine

## 2020-06-09 DIAGNOSIS — R8290 Unspecified abnormal findings in urine: Secondary | ICD-10-CM | POA: Diagnosis not present

## 2020-06-09 LAB — URINALYSIS, ROUTINE W REFLEX MICROSCOPIC
Bilirubin Urine: NEGATIVE
Glucose, UA: 150 mg/dL — AB
Ketones, ur: NEGATIVE mg/dL
Nitrite: NEGATIVE
Protein, ur: 100 mg/dL — AB
Specific Gravity, Urine: 1.019 (ref 1.005–1.030)
pH: 9 — ABNORMAL HIGH (ref 5.0–8.0)

## 2020-06-11 LAB — URINE CULTURE
Culture: >=100000 — AB
Special Requests: NORMAL

## 2020-10-22 DEATH — deceased
